# Patient Record
Sex: Male | Born: 1955
Health system: Southern US, Community
[De-identification: ages and names within clinical notes are randomized; demographics above are authoritative.]

## PROBLEM LIST (undated history)

## (undated) DIAGNOSIS — I779 Disorder of arteries and arterioles, unspecified: Secondary | ICD-10-CM

## (undated) DIAGNOSIS — N319 Neuromuscular dysfunction of bladder, unspecified: Secondary | ICD-10-CM

## (undated) DIAGNOSIS — K219 Gastro-esophageal reflux disease without esophagitis: Secondary | ICD-10-CM

## (undated) DIAGNOSIS — N186 End stage renal disease: Secondary | ICD-10-CM

## (undated) DIAGNOSIS — F909 Attention-deficit hyperactivity disorder, unspecified type: Secondary | ICD-10-CM

## (undated) DIAGNOSIS — E7800 Pure hypercholesterolemia, unspecified: Secondary | ICD-10-CM

## (undated) DIAGNOSIS — B1920 Unspecified viral hepatitis C without hepatic coma: Secondary | ICD-10-CM

## (undated) DIAGNOSIS — I7409 Other arterial embolism and thrombosis of abdominal aorta: Secondary | ICD-10-CM

## (undated) DIAGNOSIS — I739 Peripheral vascular disease, unspecified: Secondary | ICD-10-CM

## (undated) DIAGNOSIS — Z87448 Personal history of other diseases of urinary system: Secondary | ICD-10-CM

## (undated) DIAGNOSIS — J189 Pneumonia, unspecified organism: Secondary | ICD-10-CM

## (undated) DIAGNOSIS — Z992 Dependence on renal dialysis: Secondary | ICD-10-CM

## (undated) DIAGNOSIS — I10 Essential (primary) hypertension: Secondary | ICD-10-CM

## (undated) DIAGNOSIS — D649 Anemia, unspecified: Secondary | ICD-10-CM

## (undated) DIAGNOSIS — Z789 Other specified health status: Secondary | ICD-10-CM

## (undated) DIAGNOSIS — K648 Other hemorrhoids: Secondary | ICD-10-CM

## (undated) HISTORY — DX: Other hemorrhoids: K64.8

## (undated) HISTORY — DX: Pure hypercholesterolemia, unspecified: E78.00

## (undated) HISTORY — DX: Disorder of arteries and arterioles, unspecified: I77.9

## (undated) HISTORY — PX: COLONOSCOPY: SHX174

## (undated) HISTORY — DX: Unspecified viral hepatitis C without hepatic coma: B19.20

## (undated) HISTORY — DX: Neuromuscular dysfunction of bladder, unspecified: N31.9

## (undated) HISTORY — DX: Other arterial embolism and thrombosis of abdominal aorta: I74.09

## (undated) HISTORY — DX: Peripheral vascular disease, unspecified: I73.9

## (undated) HISTORY — PX: AV FISTULA REPAIR: SHX563

## (undated) HISTORY — DX: Essential (primary) hypertension: I10

## (undated) HISTORY — DX: Personal history of other diseases of urinary system: Z87.448

## (undated) HISTORY — DX: End stage renal disease: N18.6

## (undated) HISTORY — PX: NEPHRECTOMY TRANSPLANTED ORGAN: SUR880

## (undated) HISTORY — DX: Dependence on renal dialysis: Z99.2

## (undated) HISTORY — PX: KIDNEY TRANSPLANT: SHX239

---

## 1996-05-03 HISTORY — PX: AV FISTULA PLACEMENT: SHX1204

## 2007-03-17 ENCOUNTER — Ambulatory Visit (HOSPITAL_BASED_OUTPATIENT_CLINIC_OR_DEPARTMENT_OTHER): Admission: RE | Admit: 2007-03-17 | Discharge: 2007-03-17 | Payer: Self-pay | Admitting: Surgery

## 2008-04-22 ENCOUNTER — Ambulatory Visit: Payer: Self-pay | Admitting: Internal Medicine

## 2008-05-03 HISTORY — PX: NEPHRECTOMY RECIPIENT: SUR879

## 2008-05-06 ENCOUNTER — Ambulatory Visit: Payer: Self-pay | Admitting: Internal Medicine

## 2008-09-27 ENCOUNTER — Ambulatory Visit: Payer: Self-pay | Admitting: Vascular Surgery

## 2009-04-02 HISTORY — PX: OTHER SURGICAL HISTORY: SHX169

## 2009-09-12 ENCOUNTER — Ambulatory Visit: Payer: Self-pay | Admitting: Vascular Surgery

## 2009-09-30 ENCOUNTER — Ambulatory Visit: Payer: Self-pay | Admitting: Vascular Surgery

## 2009-09-30 ENCOUNTER — Ambulatory Visit (HOSPITAL_COMMUNITY): Admission: RE | Admit: 2009-09-30 | Discharge: 2009-09-30 | Payer: Self-pay | Admitting: Vascular Surgery

## 2009-10-24 ENCOUNTER — Ambulatory Visit: Payer: Self-pay | Admitting: Vascular Surgery

## 2009-12-05 ENCOUNTER — Ambulatory Visit: Payer: Self-pay | Admitting: Vascular Surgery

## 2009-12-08 ENCOUNTER — Ambulatory Visit (HOSPITAL_COMMUNITY): Admission: RE | Admit: 2009-12-08 | Discharge: 2009-12-08 | Payer: Self-pay | Admitting: Vascular Surgery

## 2009-12-08 ENCOUNTER — Ambulatory Visit: Payer: Self-pay | Admitting: Vascular Surgery

## 2009-12-30 ENCOUNTER — Ambulatory Visit: Payer: Self-pay | Admitting: Vascular Surgery

## 2010-05-24 ENCOUNTER — Encounter: Payer: Self-pay | Admitting: Nephrology

## 2010-07-17 LAB — POCT I-STAT 4, (NA,K, GLUC, HGB,HCT)
Glucose, Bld: 106 mg/dL — ABNORMAL HIGH (ref 70–99)
HCT: 51 % (ref 39.0–52.0)
Hemoglobin: 17.3 g/dL — ABNORMAL HIGH (ref 13.0–17.0)
Potassium: 4.2 mEq/L (ref 3.5–5.1)

## 2010-07-20 LAB — POCT I-STAT 4, (NA,K, GLUC, HGB,HCT)
HCT: 48 % (ref 39.0–52.0)
Potassium: 4 mEq/L (ref 3.5–5.1)
Sodium: 144 mEq/L (ref 135–145)

## 2010-09-14 ENCOUNTER — Emergency Department (HOSPITAL_COMMUNITY)
Admission: EM | Admit: 2010-09-14 | Discharge: 2010-09-14 | Disposition: A | Discharge status: Home / Self Care | Payer: Medicare Other | Attending: Emergency Medicine | Admitting: Emergency Medicine

## 2010-09-14 DIAGNOSIS — H6120 Impacted cerumen, unspecified ear: Secondary | ICD-10-CM | POA: Insufficient documentation

## 2010-09-14 DIAGNOSIS — I10 Essential (primary) hypertension: Secondary | ICD-10-CM | POA: Insufficient documentation

## 2010-09-14 DIAGNOSIS — H9209 Otalgia, unspecified ear: Secondary | ICD-10-CM | POA: Insufficient documentation

## 2010-09-15 NOTE — Assessment & Plan Note (Signed)
OFFICE VISIT   Scott Chavez, Scott Chavez  DOB:  08-Jan-1956                                       10/24/2009  [id number]   Patient presents today for follow-up of ligation of his left Cimino AV  fistula and excision of his venous aneurysmal dilatation throughout his  vein from his wrist to his antecubital space.  He has had a moderate  amount of discomfort related to the tracking of blood in the  subcutaneous tunnel.  This is more on the ulnar aspect of his arm away  from the fistula.   I explained to him that this is dependent.  He does have some blood  still in the tunnel as well.  He does have a 2+ radial pulse and a well-  perfused hand.   He will continue his usual activities.  I plan to see him again in 1  month for continued follow-up.  He was given a prescription for Tylox  #30, no refill, for persistent discomfort.     Rosetta Posner, M.D.  Electronically Signed   TFE/MEDQ  D:  10/24/2009  T:  10/24/2009  Job:  [id number]

## 2010-09-15 NOTE — Assessment & Plan Note (Signed)
OFFICE VISIT   TUF, LATONA  DOB:  10-09-1955                                       12/30/2009  [id number]   The patient presents today for followup of resection of venous aneurysm  at the level of his radiocephalic anastomosis on [id number].  He had had a  prior resection of a large venous aneurysm throughout his upper arm  fistula on 09/30/2009.  He had had a retained area of thrombosed  cephalic vein in his wrist that was still uncomfortable for him and we  opted for resection of this.  He has done quite well with minimal  discomfort around the area and excellent result.  He is pleased as am I  and will see [place] again on an as-needed basis.     Rosetta Posner, M.D.  Electronically Signed   TFE/MEDQ  D:  12/30/2009  T:  12/31/2009  Job:  [id number]   cc:   Louis Meckel, M.D.

## 2010-09-15 NOTE — Assessment & Plan Note (Signed)
OFFICE VISIT   RIDGE, DUMOND  DOB:  1955-06-16                                       09/12/2009  [id number]   The patient presents today for evaluation of his left arm AV fistula.  I  had seen him in initial consultation 1 year ago.  At that time he was  having enlargement of venous aneurysms on his left Cimino AV fistula.  At that time he reported this had been present for approximately 12  years.  He fortunately had a cadaveric transplant in December 2010 at  [facility].  He has had excellent initial result with his transplant  and has normal return of creatinine and renal function.  He has not been  using his fistula for 6 months.   PHYSICAL EXAM:  Well-developed well-nourished gentleman appearing stated  age.  Blood pressure is 111/73, pulse 76, respirations 14.  HEENT is  normal.  Musculoskeletal:  No major deformity or cyanosis.  Neurologic:  No focal weakness, paresthesias.  Skin:  Without ulcers or rashes.  His  left arm is noted for a very large AV fistula with a nicely developed  cephalic vein from the wrist up to the antecubital space.  He does have  two prominent venous aneurysms with loss of pigment over these areas.  He is not having any particular pain associated with this but is  concerned about the enlargement and these are somewhat cumbersome due to  their projection from the skin.  I had a long discussion with the  patient explaining that the only options would be to continue to watch  this and have this available should he develop a transplant rejection  and require hemodialysis in the future.  The other option would be to  ligate the AV fistula at the wrist and resect these venous aneurysms.  I  explained that this would obviously burn a bridge for any use of this  in the future.  I explained that he has had a much better outcome with  access than average and in all likelihood any future access would not  function as well as  what he has had with this.  He understands his  choices and will continue to consider this and notify us should he wish  to have a ligation of his AV fistula and resection of the venous  aneurysms.     Rosetta Posner, M.D.  Electronically Signed   TFE/MEDQ  D:  09/12/2009  T:  09/15/2009  Job:  [id number]   cc:   Louis Meckel, M.D.

## 2010-09-15 NOTE — Consult Note (Signed)
NEW PATIENT CONSULTATION   Scott Chavez, Scott Chavez  DOB:  [birth year]                                       09/27/2008  [id number]   The patient presents today for evaluation of his left arm AV fistula.  He is a 55 year old black male with a long history of renal failure and  hemodialysis.  He has recently moved to this area. He has a left Cimino  AV fistula which he reports has been present for 12 years and I have no  way of documenting this.  He does have a history of hypertension.   SOCIAL HISTORY:  He is married.  He does not smoke having quit 5 years  ago.   ALLERGIES:  No known drug allergies.   MEDICATIONS:  1. Procardia.  2. Clonidine.  3. Renagel.  4. Zantac.  5. Ambien.   PHYSICAL EXAMINATION:  General:  A well-developed, thin black male  appearing his stated age of 12.  His right radial pulse is 2+, on the  left he has a very well-developed left forearm fistula with typical  aneurysmal changes of longstanding duration.  He does have several areas  that have had multiple punctures over the last 12 years, but does not  have any skin breakdown.   I discussed the importance of continued site rotation with the patient  who understands this fully.  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  I explained despite the fact that he has  aneurysmal change over his fistula that this is simply the result of  outstanding longterm result of his fistula.  He will continue his  dialysis and see [place] on as-needed basis.   Rosetta Posner, M.D.  Electronically Signed   TFE/MEDQ  D:  09/27/2008  T:  10/01/2008  Job:  [id number]   cc:   Louis Meckel, M.D.

## 2010-09-15 NOTE — Assessment & Plan Note (Signed)
OFFICE VISIT   Scott Chavez, Scott Chavez  DOB:  [birth year]                                       12/05/2009  [id number]   HISTORY OF PRESENT ILLNESS:  The patient presents today for continued  followup of his left upper arm AV fistula excision for a huge aneurysm  degeneration.  I had done an ellipse of skin over 2 large areas of  aneurysm.  This has all healed quite nicely.  He does have an area of  trapped blood inside the vein over an area of approximately 3 cm which  is bothersome to him.  This does cause pain at the incision and he is  bumping this on things when he is at his work.  He is now 2-1/2 months  out and I feel if this was going to resolve spontaneously it would have.  I feel that it is appropriate to do an outpatient procedure for  resection of this segment of vein at near the radial anastomosis.  He  understands the procedure and we will proceed with this as an outpatient  on Monday 08/08 at [place], M.D.  Electronically Signed   TFE/MEDQ  D:  12/05/2009  T:  12/05/2009  Job:  [id number]

## 2010-09-15 NOTE — Op Note (Signed)
NAMEVYRON, Scott Chavez               ACCOUNT NO.:  [id number]   MEDICAL RECORD NO.:  [id number]          PATIENT TYPE:  AMB   LOCATION:  [place]                          FACILITY:  [facility]   PHYSICIAN:  Imogene Burn. Georgette Dover, M.D. DATE OF BIRTH:  [birth year]   DATE OF PROCEDURE:  03/17/2007  DATE OF DISCHARGE:                               OPERATIVE REPORT   PREOPERATIVE DIAGNOSIS:  Sebaceous cyst left neck.   POSTOPERATIVE DIAGNOSIS.:  Sebaceous cyst left neck.   PROCEDURE:  Excision of sebaceous cyst left neck (2 cm diameter).   SURGEON:  Imogene Burn. Georgette Dover, M.D., FACS   ANESTHESIA:  Local MAC.   INDICATIONS:  The patient is a 55 year old male with hemodialysis-  dependent renal failure who presents with an enlarging cyst of the left  side of his neck.  He is requesting excision due to the fact that it is  protruding and is easily visible.   DESCRIPTION OF PROCEDURE:  The patient was brought to the operating room  and placed in the lateral position with his left side up.  He was given  intravenous sedation.  The left side of his neck was prepped with  Betadine and draped in a sterile fashion.  A time-out was taken to  ensure the proper patient and proper procedure.  The area around the  mass was infiltrated with 0.25% Marcaine with epinephrine.  A transverse  scission was made over the mass.  We dissected down to the surface of  the cyst sharply.  We bluntly dissected around the entire cyst.  The  cyst was removed.  Hemostasis was obtained with cautery.  The wound was  closed with a layer of 4-0 Monocryl.  Dermabond was used to seal the  skin.  The patient was awakened and brought to the recovery room in  stable condition.      Imogene Burn. Tsuei, M.D.  Electronically Signed     MKT/MEDQ  D:  03/17/2007  T:  03/18/2007  Job:  [id number]   cc:   Louis Meckel, M.D.

## 2010-12-14 ENCOUNTER — Emergency Department (HOSPITAL_COMMUNITY): Payer: PRIVATE HEALTH INSURANCE

## 2010-12-14 ENCOUNTER — Emergency Department (HOSPITAL_COMMUNITY)
Admission: EM | Admit: 2010-12-14 | Discharge: 2010-12-15 | Disposition: A | Discharge status: Home / Self Care | Payer: PRIVATE HEALTH INSURANCE | Attending: Emergency Medicine | Admitting: Emergency Medicine

## 2010-12-14 DIAGNOSIS — S93409A Sprain of unspecified ligament of unspecified ankle, initial encounter: Secondary | ICD-10-CM | POA: Insufficient documentation

## 2010-12-14 DIAGNOSIS — I10 Essential (primary) hypertension: Secondary | ICD-10-CM | POA: Insufficient documentation

## 2010-12-14 DIAGNOSIS — X500XXA Overexertion from strenuous movement or load, initial encounter: Secondary | ICD-10-CM | POA: Insufficient documentation

## 2010-12-14 DIAGNOSIS — Y9302 Activity, running: Secondary | ICD-10-CM | POA: Insufficient documentation

## 2011-01-07 ENCOUNTER — Emergency Department (HOSPITAL_COMMUNITY)
Admission: EM | Admit: 2011-01-07 | Discharge: 2011-01-07 | Disposition: A | Discharge status: Home / Self Care | Payer: PRIVATE HEALTH INSURANCE | Attending: Emergency Medicine | Admitting: Emergency Medicine

## 2011-01-07 ENCOUNTER — Emergency Department (HOSPITAL_COMMUNITY): Payer: PRIVATE HEALTH INSURANCE

## 2011-01-07 DIAGNOSIS — R05 Cough: Secondary | ICD-10-CM | POA: Insufficient documentation

## 2011-01-07 DIAGNOSIS — Z940 Kidney transplant status: Secondary | ICD-10-CM | POA: Insufficient documentation

## 2011-01-07 DIAGNOSIS — R0989 Other specified symptoms and signs involving the circulatory and respiratory systems: Secondary | ICD-10-CM | POA: Insufficient documentation

## 2011-01-07 DIAGNOSIS — R059 Cough, unspecified: Secondary | ICD-10-CM | POA: Insufficient documentation

## 2011-01-07 DIAGNOSIS — Z79899 Other long term (current) drug therapy: Secondary | ICD-10-CM | POA: Insufficient documentation

## 2011-01-07 DIAGNOSIS — J40 Bronchitis, not specified as acute or chronic: Secondary | ICD-10-CM | POA: Insufficient documentation

## 2011-01-07 DIAGNOSIS — I10 Essential (primary) hypertension: Secondary | ICD-10-CM | POA: Insufficient documentation

## 2011-02-09 LAB — BASIC METABOLIC PANEL
Calcium: 8.8
Creatinine, Ser: 11.06 — ABNORMAL HIGH
GFR calc non Af Amer: 5 — ABNORMAL LOW
Potassium: 5.1
Sodium: 137

## 2011-02-09 LAB — CBC
Hemoglobin: 15.5
RBC: 5.59
RDW: 18.1 — ABNORMAL HIGH

## 2011-02-09 LAB — DIFFERENTIAL
Basophils Relative: 0
Lymphocytes Relative: 21
Lymphs Abs: 1
Monocytes Relative: 9
Neutro Abs: 3.2

## 2011-04-19 DIAGNOSIS — N319 Neuromuscular dysfunction of bladder, unspecified: Secondary | ICD-10-CM | POA: Diagnosis present

## 2011-04-19 DIAGNOSIS — Z940 Kidney transplant status: Secondary | ICD-10-CM

## 2013-01-16 ENCOUNTER — Encounter: Payer: Self-pay | Admitting: *Deleted

## 2013-01-22 ENCOUNTER — Other Ambulatory Visit: Payer: Self-pay | Admitting: *Deleted

## 2013-01-22 ENCOUNTER — Institutional Professional Consult (permissible substitution): Payer: Medicare Other | Admitting: Cardiology

## 2013-01-22 ENCOUNTER — Encounter: Payer: Self-pay | Admitting: Vascular Surgery

## 2013-01-22 DIAGNOSIS — I70219 Atherosclerosis of native arteries of extremities with intermittent claudication, unspecified extremity: Secondary | ICD-10-CM

## 2013-02-09 ENCOUNTER — Encounter: Payer: Medicare Other | Admitting: Vascular Surgery

## 2013-02-15 ENCOUNTER — Encounter: Payer: Self-pay | Admitting: Vascular Surgery

## 2013-02-16 ENCOUNTER — Ambulatory Visit (INDEPENDENT_AMBULATORY_CARE_PROVIDER_SITE_OTHER): Payer: PRIVATE HEALTH INSURANCE | Admitting: Vascular Surgery

## 2013-02-16 ENCOUNTER — Ambulatory Visit (HOSPITAL_COMMUNITY)
Admission: RE | Admit: 2013-02-16 | Discharge: 2013-02-16 | Disposition: A | Discharge status: Home / Self Care | Payer: PRIVATE HEALTH INSURANCE | Source: Ambulatory Visit | Attending: Vascular Surgery | Admitting: Vascular Surgery

## 2013-02-16 ENCOUNTER — Encounter (INDEPENDENT_AMBULATORY_CARE_PROVIDER_SITE_OTHER): Payer: Self-pay

## 2013-02-16 ENCOUNTER — Other Ambulatory Visit: Payer: Self-pay | Admitting: Vascular Surgery

## 2013-02-16 ENCOUNTER — Ambulatory Visit (INDEPENDENT_AMBULATORY_CARE_PROVIDER_SITE_OTHER)
Admission: RE | Admit: 2013-02-16 | Discharge: 2013-02-16 | Disposition: A | Discharge status: Home / Self Care | Payer: PRIVATE HEALTH INSURANCE | Source: Ambulatory Visit | Attending: Vascular Surgery | Admitting: Vascular Surgery

## 2013-02-16 ENCOUNTER — Encounter: Payer: Self-pay | Admitting: Vascular Surgery

## 2013-02-16 VITALS — BP 177/95 | HR 54 | Ht 70.0 in | Wt 156.0 lb

## 2013-02-16 DIAGNOSIS — I70219 Atherosclerosis of native arteries of extremities with intermittent claudication, unspecified extremity: Secondary | ICD-10-CM

## 2013-02-16 DIAGNOSIS — R9430 Abnormal result of cardiovascular function study, unspecified: Secondary | ICD-10-CM

## 2013-02-16 DIAGNOSIS — Z9489 Other transplanted organ and tissue status: Secondary | ICD-10-CM

## 2013-02-16 DIAGNOSIS — Z940 Kidney transplant status: Secondary | ICD-10-CM | POA: Insufficient documentation
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
## 2013-02-23 ENCOUNTER — Other Ambulatory Visit: Payer: Self-pay

## 2013-03-02 ENCOUNTER — Encounter (HOSPITAL_COMMUNITY): Payer: Self-pay | Admitting: Pharmacist

## 2013-03-07 ENCOUNTER — Encounter (HOSPITAL_COMMUNITY): Payer: Self-pay | Admitting: Pharmacy Technician

## 2013-03-09 ENCOUNTER — Encounter (HOSPITAL_COMMUNITY)
Admission: RE | Disposition: A | Discharge status: Home / Self Care | Payer: Self-pay | Source: Ambulatory Visit | Attending: Vascular Surgery

## 2013-03-09 ENCOUNTER — Ambulatory Visit (HOSPITAL_COMMUNITY)
Admission: RE | Admit: 2013-03-09 | Discharge: 2013-03-09 | Disposition: A | Discharge status: Home / Self Care | Payer: PRIVATE HEALTH INSURANCE | Source: Ambulatory Visit | Attending: Vascular Surgery | Admitting: Vascular Surgery

## 2013-03-09 ENCOUNTER — Telehealth: Payer: Self-pay | Admitting: Vascular Surgery

## 2013-03-09 DIAGNOSIS — N186 End stage renal disease: Secondary | ICD-10-CM | POA: Insufficient documentation

## 2013-03-09 DIAGNOSIS — Z79899 Other long term (current) drug therapy: Secondary | ICD-10-CM | POA: Insufficient documentation

## 2013-03-09 DIAGNOSIS — I70219 Atherosclerosis of native arteries of extremities with intermittent claudication, unspecified extremity: Secondary | ICD-10-CM | POA: Insufficient documentation

## 2013-03-09 DIAGNOSIS — Z992 Dependence on renal dialysis: Secondary | ICD-10-CM | POA: Insufficient documentation

## 2013-03-09 DIAGNOSIS — I120 Hypertensive chronic kidney disease with stage 5 chronic kidney disease or end stage renal disease: Secondary | ICD-10-CM | POA: Insufficient documentation

## 2013-03-09 DIAGNOSIS — Z940 Kidney transplant status: Secondary | ICD-10-CM | POA: Insufficient documentation

## 2013-03-09 HISTORY — PX: LOWER EXTREMITY ANGIOGRAM: SHX5508

## 2013-03-09 HISTORY — PX: ABDOMINAL AORTAGRAM: SHX5454

## 2013-03-09 LAB — POCT I-STAT, CHEM 8
BUN: 11 mg/dL (ref 6–23)
Calcium, Ion: 1.31 mmol/L — ABNORMAL HIGH (ref 1.12–1.23)
Creatinine, Ser: 1.2 mg/dL (ref 0.50–1.35)
Glucose, Bld: 116 mg/dL — ABNORMAL HIGH (ref 70–99)
Hemoglobin: 14.6 g/dL (ref 13.0–17.0)
Potassium: 3.6 mEq/L (ref 3.5–5.1)
Sodium: 142 mEq/L (ref 135–145)
TCO2: 22 mmol/L (ref 0–100)

## 2013-03-09 SURGERY — ABDOMINAL AORTAGRAM
Anesthesia: LOCAL

## 2013-03-09 MED ORDER — MORPHINE SULFATE 10 MG/ML IJ SOLN: 2.0000 mg | INTRAMUSCULAR | Status: DC | PRN

## 2013-03-09 MED ORDER — NITROGLYCERIN 0.2 MG/ML ON CALL CATH LAB
INTRAVENOUS | Status: AC
  Filled 2013-03-09: qty 1

## 2013-03-09 MED ORDER — HYDRALAZINE HCL 20 MG/ML IJ SOLN: 10.0000 mg | INTRAMUSCULAR | Status: DC | PRN

## 2013-03-09 MED ORDER — FENTANYL CITRATE 0.05 MG/ML IJ SOLN
INTRAMUSCULAR | Status: AC
  Filled 2013-03-09: qty 2

## 2013-03-09 MED ORDER — LIDOCAINE HCL (PF) 1 % IJ SOLN
INTRAMUSCULAR | Status: AC
  Filled 2013-03-09: qty 30

## 2013-03-09 MED ORDER — LABETALOL HCL 5 MG/ML IV SOLN: 10.0000 mg | INTRAVENOUS | Status: DC | PRN

## 2013-03-09 MED ORDER — HEPARIN (PORCINE) IN NACL 2-0.9 UNIT/ML-% IJ SOLN
INTRAMUSCULAR | Status: AC
  Filled 2013-03-09: qty 500

## 2013-03-09 MED ORDER — OXYCODONE-ACETAMINOPHEN 5-325 MG PO TABS: 1.0000 | ORAL_TABLET | ORAL | Status: DC | PRN

## 2013-03-09 MED ORDER — SODIUM CHLORIDE 0.9 % IV SOLN
INTRAVENOUS | Status: DC
  Administered 2013-03-09: 06:00:00 via INTRAVENOUS

## 2013-03-09 MED ORDER — ACETAMINOPHEN 325 MG PO TABS: 325.0000 mg | ORAL_TABLET | ORAL | Status: DC | PRN

## 2013-03-09 MED ORDER — ONDANSETRON HCL 4 MG/2ML IJ SOLN: 4.0000 mg | Freq: Four times a day (QID) | INTRAMUSCULAR | Status: DC | PRN

## 2013-03-09 MED ORDER — SODIUM CHLORIDE 0.45 % IV SOLN
INTRAVENOUS | Status: DC
  Administered 2013-03-09: 09:00:00 via INTRAVENOUS

## 2013-03-09 MED ORDER — MIDAZOLAM HCL 2 MG/2ML IJ SOLN
INTRAMUSCULAR | Status: AC
  Filled 2013-03-09: qty 2

## 2013-03-09 MED ORDER — ACETAMINOPHEN 325 MG RE SUPP: 325.0000 mg | RECTAL | Status: DC | PRN

## 2013-03-09 MED ORDER — METOPROLOL TARTRATE 1 MG/ML IV SOLN: 2.0000 mg | INTRAVENOUS | Status: DC | PRN

## 2013-03-09 NOTE — Interval H&P Note (Signed)
History and Physical Interval Note:  03/09/2013 7:44 AM  Scott Chavez  has presented today for surgery, with the diagnosis of PVD  The various methods of treatment have been discussed with the patient and family. After consideration of risks, benefits and other options for treatment, the patient has consented to  Procedure(s): ABDOMINAL AORTAGRAM (N/A) as a surgical intervention .  The patient's history has been reviewed, patient examined, no change in status, stable for surgery.  I have reviewed the patient's chart and labs.  Questions were answered to the patient's satisfaction.     FIELDS,CHARLES E
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
## 2013-03-09 NOTE — Op Note (Signed)
[facility]           OF [place]   Procedure: Aortogram with bilateral lower extremity runoff  Preoperative diagnosis: Claudication  Postoperative diagnosis: Same  Anesthesia: Local with sedation  Operative details: After obtaining informed consent, the patient was taken to the [facility]. The patient was placed in supine position on the Angio table. Both groins were prepped and draped in usual sterile fashion. Local anesthesia was infiltrated over the left common femoral artery. Initially, ultrasound was used to identify the common femoral artery.  There was a large amount of calcific plaque in the artery. This was in islands and I was able to find a reasonable spot for puncture of the artery.   An introducer needle was used to cannulate the left common femoral artery under ultrasound guidance and an 035 versacore wire threaded into the abdominal aorta under fluoroscopic guidance. Next a 5 French sheath is placed over the guidewire in the left common femoral artery. This was thoroughly flushed with heparinized saline. A 5 French pigtail catheter was placed over the guidewire into the abdominal aorta and abdominal aortogram was obtained. The infrarenal abdominal aorta has some calcification but is patent. The right and left common iliac arteries are patent. There is an exophytic calcified plaque which obstructs approximately 60% of the left common iliac origin. There is a flap-like plaque in the right mid common iliac artery which does not appear to be obstructing flow. The internal iliac arteries are occluded bilaterally. The external iliac arteries are patent. There is a right pelvic kidney consistent with transplant with the anastomosis to the right external iliac artery. The anastomosis is widely patent. Bilateral oblique views of the pelvis were obtained to confirm the above findings after pulling the pigtail catheter down just above the aortic bifurcation. Next bilateral  lower extremity runoff views were obtained through the pigtail catheter.  In the left lower extremity, the left common femoral artery is calcified but patent. The left profunda femoris is patent. The left superficial femoral artery is diffusely diseased with multiple areas of subtotal occlusion. There is a high-grade calcified exophytic stenosis in the left popliteal artery approaching 90% right behind the knee joint. The below-knee popliteal artery is patent. There is one-vessel runoff via the peroneal to the left foot. The anterior tibial artery and posterior tibial arteries are occluded.  In the right lower extremity, the right common femoral artery is calcified but patent. The right profunda femoris is patent. The right superficial femoral artery is diffusely diseased with multiple areas of subtotal occlusion. The above-knee popliteal artery is also diffusely diseased with multiple areas of stenosis. The below-knee popliteal artery is patent. There is 2 vessel runoff via the peroneal and posterior tibial artery. The anterior tibial artery is occluded.  At this point I measured a pressure gradient across the left iliac stenosis. The pigtail catheter was exchanged over a guidewire for a 5 French straight catheter. The patient was given 200 mcg of nitroglycerin down the left femoral sheath. This was flushed with heparinized saline. We then proceeded to measure pressure from the infrarenal aorta all the way down through the left iliac system. There was a 10 mm gradient from the aorta to the mid iliac. Since this drop did not seem significant with nitroglycerin administration, I decided not to intervene on the left common iliac lesion. At this point the 5 French sheath was thoroughly flushed with heparinized saline.   The 5 Fr sheath  was left in place to be pulled in the holding area. The patient tolerated the procedure well and there were no complications. Patient was taken to the holding area in stable  condition.  Operative findings: #1 patent arterial anastomosis to right pelvic transplanted kidney #2 bilateral diffuse superficial femoral artery occlusive disease with several areas of subtotal occlusion #3 one vessel runoff left foot via the peroneal #42 vessel runoff to the right foot via the peroneal and posterior tibial arteries #5 calcified exophytic plaque left common iliac artery with only 10 mm gradient with nitroglycerin provocation  Operative management: I discussed the above images with Dr. Bridgett Larsson. He will review these images and discuss further plans and management with the patient regarding possible bypass percutaneous intervention or conservative management.  Ruta Hinds, MD [facility] [place] Office: [phone] Pager: [phone]

## 2013-03-09 NOTE — Progress Notes (Signed)
Pt received form cath proceddure alert and able to tolerate food and fluid.  Denies any discomfort at this time.  Family at bedside

## 2013-03-09 NOTE — Telephone Encounter (Addendum)
Message copied by Gena Fray on Fri Mar 09, 2013 10:19 AM ------      Message from: Elam Dutch      Created: Fri Mar 09, 2013  8:41 AM       Aortogram with bilat runoff, pressure measurement with nitro administration left leg.  Ultrasound.            Pt needs appt with Dr Bridgett Larsson next 1-2 weeks to discuss further management.            Charles ------  03/09/13: left message for patient on home # listed, dpm

## 2013-03-09 NOTE — Progress Notes (Signed)
Discharge instruction given per MD order.  Pt and CG able to verbalize understanding.  Pt to car via wheelchair. 

## 2013-03-15 ENCOUNTER — Encounter: Payer: Self-pay | Admitting: Vascular Surgery

## 2013-03-16 ENCOUNTER — Encounter: Payer: Self-pay | Admitting: Vascular Surgery

## 2013-03-16 ENCOUNTER — Ambulatory Visit (INDEPENDENT_AMBULATORY_CARE_PROVIDER_SITE_OTHER): Payer: PRIVATE HEALTH INSURANCE | Admitting: Vascular Surgery

## 2013-03-16 VITALS — BP 104/78 | HR 66 | Ht 70.0 in | Wt 156.1 lb

## 2013-03-16 DIAGNOSIS — I70219 Atherosclerosis of native arteries of extremities with intermittent claudication, unspecified extremity: Secondary | ICD-10-CM

## 2013-03-16 DIAGNOSIS — Z01810 Encounter for preprocedural cardiovascular examination: Secondary | ICD-10-CM

## 2013-03-16 NOTE — Addendum Note (Signed)
Addended by: Mena Goes on: 03/16/2013 03:48 PM   Modules accepted: Orders

## 2013-03-16 NOTE — Progress Notes (Signed)
[facility] [place]  Postoperative Visit  History of Present Illness  Scott Chavez is a 57 y.o. male who presents for postoperative follow-up from procedure on Date: 03/09/13: aortogram with bilateral runoff.  The patient has had no complications or sx from his L femoral access.  The patient notes continued short distance claudication, L>R.  The patient notes difficulty with ambulation, which interfering with his activities of daily living.    Past Medical History, Past Surgical History, Social History, Family History, Medications, Allergies, and Review of Systems are unchanged from previous evaluation on 03/09/13.  On ROS: no ulcer or gangrene  For VQI Use Only  PRE-ADM LIVING: Home  AMB STATUS: Ambulatory  Physical Examination  Filed Vitals:   03/16/13 0924  BP: 104/78  Pulse: 66  Height: 5\' 10"  (1.778 m)  Weight: 156 lb 1.6 oz (70.806 kg)  SpO2: 97%   Body mass index is 22.4 kg/(m^2).  General: A&O x 3, WDWN  Pulmonary: Sym exp, good air movt, CTAB, no rales, rhonchi, & wheezing  Cardiac: RRR, Nl S1, S2, no Murmurs, rubs or gallops  Vascular: Vessel Right Left  Radial Palpable Palpable  Brachial Palpable Palpable  Carotid Palpable, without bruit Palpable, without bruit  Aorta Not palpable N/A  Femoral Palpable Palpable  Popliteal Not palpable Not palpable  PT Not Palpable Not Palpable  DP Not Palpable Not Palpable   Gastrointestinal: soft, NTND, -G/R, - HSM, - masses, - CVAT B  Musculoskeletal: M/S 5/5 throughout , Extremities without ischemic changes , Left groin without hematoma, no echymosis present at cannulation site  Neurologic:  Pain and light touch intact in extremities , Motor exam as listed above  Medical Decision Making  Scott Chavez is a 57 y.o. male who presents s/p diagnostic aortogram with bilateral runoff, atherosclerosis with life-style limiting intermittent claudication. Based on his angiographic findings, this  patient needs: possible L fem-BK pop bypass and possible R SFA orbital atherectomy and PTA. The priority will be the left leg as it is significantly more diseased and sx.  Preop for that intervention include: Preop Cardiac risk stratification and optimization, BLE GSV mapping I discussed in depth with the patient the nature of atherosclerosis, and emphasized the importance of maximal medical management including strict control of blood pressure, blood glucose, and lipid levels, obtaining regular exercise, and cessation of smoking.  The patient is aware that without maximal medical management the underlying atherosclerotic disease process will progress, limiting the benefit of any interventions.  Once his preop work-up is completed, he will return for a final decision on intervention.  Thank you for allowing [place] to participate in this patient's care.  [name], MD [facility] [place] Office: [phone] Pager: [phone]

## 2013-03-23 ENCOUNTER — Encounter (HOSPITAL_COMMUNITY): Payer: PRIVATE HEALTH INSURANCE

## 2013-03-23 ENCOUNTER — Ambulatory Visit: Payer: PRIVATE HEALTH INSURANCE | Admitting: Vascular Surgery

## 2013-03-27 ENCOUNTER — Encounter: Payer: Self-pay | Admitting: Cardiology

## 2013-03-27 ENCOUNTER — Ambulatory Visit (INDEPENDENT_AMBULATORY_CARE_PROVIDER_SITE_OTHER): Payer: PRIVATE HEALTH INSURANCE | Admitting: Cardiology

## 2013-03-27 VITALS — BP 140/86 | HR 61 | Ht 70.0 in | Wt 154.0 lb

## 2013-03-27 DIAGNOSIS — I10 Essential (primary) hypertension: Secondary | ICD-10-CM

## 2013-03-27 DIAGNOSIS — E785 Hyperlipidemia, unspecified: Secondary | ICD-10-CM

## 2013-03-27 DIAGNOSIS — I70219 Atherosclerosis of native arteries of extremities with intermittent claudication, unspecified extremity: Secondary | ICD-10-CM

## 2013-03-27 DIAGNOSIS — Z01818 Encounter for other preprocedural examination: Secondary | ICD-10-CM

## 2013-03-27 DIAGNOSIS — Z01810 Encounter for preprocedural cardiovascular examination: Secondary | ICD-10-CM | POA: Insufficient documentation

## 2013-03-27 NOTE — Assessment & Plan Note (Signed)
Continue statin. 

## 2013-03-27 NOTE — Patient Instructions (Signed)
Your physician recommends that you schedule a follow-up appointment in:  AS NEEDED PENDING TEST RESULTS  Your physician has requested that you have a lexiscan myoview. For further information please visit [email or web address]. Please follow instruction sheet, as given.   

## 2013-03-27 NOTE — Assessment & Plan Note (Signed)
Continue aspirin and statin. Management per vascular surgery.

## 2013-03-27 NOTE — Assessment & Plan Note (Signed)
Blood pressure controlled. Continue present medications. 
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
## 2013-03-27 NOTE — Assessment & Plan Note (Signed)
Plan lexiscan nuclear study for risk stratification preoperatively. If normal he may proceed with the surgery.

## 2013-04-12 ENCOUNTER — Encounter: Payer: Self-pay | Admitting: Vascular Surgery

## 2013-04-13 ENCOUNTER — Other Ambulatory Visit: Payer: Self-pay | Admitting: Vascular Surgery

## 2013-04-13 ENCOUNTER — Ambulatory Visit (INDEPENDENT_AMBULATORY_CARE_PROVIDER_SITE_OTHER): Payer: PRIVATE HEALTH INSURANCE | Admitting: Vascular Surgery

## 2013-04-13 ENCOUNTER — Other Ambulatory Visit: Payer: Self-pay | Admitting: *Deleted

## 2013-04-13 ENCOUNTER — Ambulatory Visit (HOSPITAL_COMMUNITY)
Admission: RE | Admit: 2013-04-13 | Discharge: 2013-04-13 | Disposition: A | Discharge status: Home / Self Care | Payer: PRIVATE HEALTH INSURANCE | Source: Ambulatory Visit | Attending: Vascular Surgery | Admitting: Vascular Surgery

## 2013-04-13 ENCOUNTER — Encounter: Payer: Self-pay | Admitting: *Deleted

## 2013-04-13 ENCOUNTER — Encounter: Payer: Self-pay | Admitting: Vascular Surgery

## 2013-04-13 VITALS — BP 127/83 | HR 61 | Ht 70.0 in | Wt 150.0 lb

## 2013-04-13 DIAGNOSIS — Z01810 Encounter for preprocedural cardiovascular examination: Secondary | ICD-10-CM

## 2013-04-13 DIAGNOSIS — I70219 Atherosclerosis of native arteries of extremities with intermittent claudication, unspecified extremity: Secondary | ICD-10-CM
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
## 2013-04-16 ENCOUNTER — Ambulatory Visit (HOSPITAL_COMMUNITY): Payer: PRIVATE HEALTH INSURANCE | Attending: Cardiovascular Disease | Admitting: Radiology

## 2013-04-16 ENCOUNTER — Encounter: Payer: Self-pay | Admitting: *Deleted

## 2013-04-16 ENCOUNTER — Encounter: Payer: Self-pay | Admitting: Cardiovascular Disease

## 2013-04-16 VITALS — BP 121/79 | Ht 70.0 in | Wt 155.0 lb

## 2013-04-16 DIAGNOSIS — R0789 Other chest pain: Secondary | ICD-10-CM

## 2013-04-16 DIAGNOSIS — Z01810 Encounter for preprocedural cardiovascular examination: Secondary | ICD-10-CM | POA: Insufficient documentation

## 2013-04-16 DIAGNOSIS — R0609 Other forms of dyspnea: Secondary | ICD-10-CM

## 2013-04-16 DIAGNOSIS — Z01818 Encounter for other preprocedural examination: Secondary | ICD-10-CM

## 2013-04-16 MED ORDER — TECHNETIUM TC 99M SESTAMIBI GENERIC - CARDIOLITE
11.0000 | Freq: Once | INTRAVENOUS | Status: AC | PRN
  Administered 2013-04-16: 11 via INTRAVENOUS

## 2013-04-16 MED ORDER — REGADENOSON 0.4 MG/5ML IV SOLN
0.4000 mg | Freq: Once | INTRAVENOUS | Status: AC
  Administered 2013-04-16: 0.4 mg via INTRAVENOUS

## 2013-04-16 MED ORDER — TECHNETIUM TC 99M SESTAMIBI GENERIC - CARDIOLITE
33.0000 | Freq: Once | INTRAVENOUS | Status: AC | PRN
  Administered 2013-04-16: 33 via INTRAVENOUS

## 2013-04-16 NOTE — Progress Notes (Signed)
  [facility] [address] [place], [place] [address] [phone]    Cardiology Nuclear Med Study  Scott Chavez is a 57 y.o. male     MRN : [id number]     DOB: [birth year]  Procedure Date: 04/16/2013  Nuclear Med Background Indication for Stress Test:  Evaluation for Ischemia, Surgical Clearance:Left Pop Bypass with Dr. Oneida Alar on 04/24/13 and Abnormal EKG:NS T wave changes History:  MR: 1 yr in [place], Renal Failure/ AV fistula with Kidney Transplant Cardiac Risk Factors: History of Smoking, Hypertension, Lipids and PVD  Symptoms:  Chest Pressure and DOE   Nuclear Pre-Procedure Caffeine/Decaff Intake:  7:00pm NPO After: 9:30pm   Lungs:  clear O2 Sat: 98% on room air. IV 0.9% NS with Angio Cath:  22g  IV Site: R Hand  IV Started by:  Matilde Haymaker, RN  Chest Size (in):  40 Cup Size: n/a  Height: 5\' 10"  (1.778 m)  Weight:  155 lb (70.308 kg)  BMI:  Body mass index is 22.24 kg/(m^2). Tech Comments:  n/a    Nuclear Med Study 1 or 2 day study: 1 day  Stress Test Type:  Lexiscan  Reading MD: Mertie Moores, MD  Order Authorizing Provider:  Queen Blossom  Resting Radionuclide: Technetium 75m Sestamibi  Resting Radionuclide Dose: 10.8 mCi   Stress Radionuclide:  Technetium 2m Sestamibi  Stress Radionuclide Dose: 33.0 mCi           Stress Protocol Rest HR: 58 Stress HR: 81  Rest BP: 121/79 Stress BP: 129/81  Exercise Time (min): n/a METS: n/a   Predicted Max HR: 163 bpm % Max HR: 49.69 bpm Rate Pressure Product: 10449   Dose of Adenosine (mg):  n/a Dose of Lexiscan: 0.4 mg  Dose of Atropine (mg): n/a Dose of Dobutamine: n/a mcg/kg/min (at max HR)  Stress Test Technologist: Perrin Maltese, EMT-P  Nuclear Technologist:  Charlton Amor, CNMT     Rest Procedure:  Myocardial perfusion imaging was performed at rest 45 minutes following the intravenous administration of Technetium 108m Sestamibi. Rest ECG: NSR - Normal EKG  Stress  Procedure:  The patient received IV Lexiscan 0.4 mg over 15-seconds.  Technetium 64m Sestamibi injected at 30-seconds. This patient felt weird with the Lexiscan injection. Quantitative spect images were obtained after a 45 minute delay. Stress ECG: No significant change from baseline ECG  QPS Raw Data Images:  Normal; no motion artifact; normal heart/lung ratio. Stress Images:  Normal homogeneous uptake in all areas of the myocardium. Rest Images:  Normal homogeneous uptake in all areas of the myocardium. Subtraction (SDS):  No evidence of ischemia. Transient Ischemic Dilatation (Normal <1.22):  0.99 Lung/Heart Ratio (Normal <0.45):  0.28  Quantitative Gated Spect Images QGS EDV:  103 ml QGS ESV:  47 ml  Impression Exercise Capacity:  Lexiscan with no exercise. BP Response:  Normal blood pressure response. Clinical Symptoms:  No significant symptoms noted. ECG Impression:  No significant ST segment change suggestive of ischemia. Comparison with Prior Nuclear Study: No previous nuclear study performed  Overall Impression:  Normal stress nuclear study.  No evidence of ischemia.    LV Ejection Fraction: 55%.  LV Wall Motion:  NL LV Function; NL Wall Motion.   Thayer Headings, Brooke Bonito., MD, [facility] 04/16/2013, 5:07 PM Office - [phone] Pager [phone]    .

## 2013-04-18 ENCOUNTER — Other Ambulatory Visit (HOSPITAL_COMMUNITY): Payer: Self-pay | Admitting: *Deleted

## 2013-04-18 ENCOUNTER — Encounter (HOSPITAL_COMMUNITY): Payer: Self-pay | Admitting: Pharmacy Technician

## 2013-04-18 NOTE — Pre-Procedure Instructions (Signed)
Scott Chavez  04/18/2013   Your procedure is scheduled on:  Tuesday, April 24, 2013 at 7:30 AM.   Report to [facility] Entrance "A" Admitting Office at 5:30 AM.   Call this number if you have problems the morning of surgery: [phone]   Remember:   Do not eat food or drink liquids after midnight Monday, 04/23/13.   Take these medicines the morning of surgery with A SIP OF WATER: finasteride (PROSCAR), mycophenolate (CELLCEPT), NIFEdipine (PROCARDIA-XL/ADALAT-CC/NIFEDICAL-XL), omeprazole (PRILOSEC), tacrolimus (PROGRAF), tamsulosin (FLOMAX)                Do not wear jewelry.  Do not wear lotions, powders, or cologne. You may wear deodorant.  Men may shave face and neck.  Do not bring valuables to the hospital.  [facility] is not responsible                  for any belongings or valuables.               Contacts, dentures or bridgework may not be worn into surgery.  Leave suitcase in the car. After surgery it may be brought to your room.  For patients admitted to the hospital, discharge time is determined by your                treatment team.                Special Instructions: Shower using CHG 2 nights before surgery and the night before surgery.  If you shower the day of surgery use CHG.  Use special wash - you have one bottle of CHG for all showers.  You should use approximately 1/3 of the bottle for each shower.   Please read over the following fact sheets that you were given: Pain Booklet, Coughing and Deep Breathing, Blood Transfusion Information, MRSA Information and Surgical Site Infection Prevention

## 2013-04-19 ENCOUNTER — Ambulatory Visit (HOSPITAL_COMMUNITY)
Admission: RE | Admit: 2013-04-19 | Discharge: 2013-04-19 | Disposition: A | Discharge status: Home / Self Care | Payer: PRIVATE HEALTH INSURANCE | Source: Ambulatory Visit | Attending: Vascular Surgery | Admitting: Vascular Surgery

## 2013-04-19 ENCOUNTER — Encounter (HOSPITAL_COMMUNITY)
Admission: RE | Admit: 2013-04-19 | Discharge: 2013-04-19 | Disposition: A | Discharge status: Home / Self Care | Payer: PRIVATE HEALTH INSURANCE | Source: Ambulatory Visit | Attending: Vascular Surgery | Admitting: Vascular Surgery

## 2013-04-19 ENCOUNTER — Encounter (HOSPITAL_COMMUNITY): Payer: Self-pay

## 2013-04-19 DIAGNOSIS — Z01818 Encounter for other preprocedural examination: Secondary | ICD-10-CM | POA: Insufficient documentation

## 2013-04-19 DIAGNOSIS — J4489 Other specified chronic obstructive pulmonary disease: Secondary | ICD-10-CM | POA: Insufficient documentation

## 2013-04-19 DIAGNOSIS — Z01812 Encounter for preprocedural laboratory examination: Secondary | ICD-10-CM | POA: Insufficient documentation

## 2013-04-19 DIAGNOSIS — J449 Chronic obstructive pulmonary disease, unspecified: Secondary | ICD-10-CM | POA: Insufficient documentation

## 2013-04-19 HISTORY — DX: Anemia, unspecified: D64.9

## 2013-04-19 HISTORY — DX: Gastro-esophageal reflux disease without esophagitis: K21.9

## 2013-04-19 HISTORY — DX: Other specified health status: Z78.9

## 2013-04-19 LAB — CBC
Hemoglobin: 15.4 g/dL (ref 13.0–17.0)
MCH: 30.6 pg (ref 26.0–34.0)
MCV: 89.9 fL (ref 78.0–100.0)
Platelets: 173 10*3/uL (ref 150–400)
RBC: 5.03 MIL/uL (ref 4.22–5.81)
WBC: 3.6 10*3/uL — ABNORMAL LOW (ref 4.0–10.5)

## 2013-04-19 LAB — COMPREHENSIVE METABOLIC PANEL
ALT: 22 U/L (ref 0–53)
AST: 33 U/L (ref 0–37)
CO2: 25 mEq/L (ref 19–32)
Calcium: 9.6 mg/dL (ref 8.4–10.5)
Chloride: 104 mEq/L (ref 96–112)
GFR calc Af Amer: 89 mL/min — ABNORMAL LOW (ref >90)
GFR calc non Af Amer: 77 mL/min — ABNORMAL LOW (ref >90)
Glucose, Bld: 107 mg/dL — ABNORMAL HIGH (ref 70–99)
Sodium: 137 mEq/L (ref 135–145)
Total Bilirubin: 0.5 mg/dL (ref 0.3–1.2)
Total Protein: 7.1 g/dL (ref 6.0–8.3)

## 2013-04-19 LAB — ABO/RH: ABO/RH(D): O POS

## 2013-04-19 LAB — SURGICAL PCR SCREEN
MRSA, PCR: NEGATIVE
Staphylococcus aureus: NEGATIVE

## 2013-04-19 LAB — TYPE AND SCREEN: ABO/RH(D): O POS

## 2013-04-23 MED ORDER — SODIUM CHLORIDE 0.9 % IV SOLN: INTRAVENOUS | Status: DC

## 2013-04-23 MED ORDER — DEXTROSE 5 % IV SOLN
1.5000 g | INTRAVENOUS | Status: AC
  Administered 2013-04-24: 1.5 g via INTRAVENOUS
  Filled 2013-04-23: qty 1.5

## 2013-04-24 ENCOUNTER — Inpatient Hospital Stay (HOSPITAL_COMMUNITY): Payer: PRIVATE HEALTH INSURANCE | Admitting: Certified Registered"

## 2013-04-24 ENCOUNTER — Encounter (HOSPITAL_COMMUNITY): Payer: Self-pay | Admitting: Certified Registered"

## 2013-04-24 ENCOUNTER — Inpatient Hospital Stay (HOSPITAL_COMMUNITY): Payer: PRIVATE HEALTH INSURANCE

## 2013-04-24 ENCOUNTER — Encounter (HOSPITAL_COMMUNITY)
Admission: RE | Disposition: A | Discharge status: Home / Self Care | Payer: Self-pay | Source: Ambulatory Visit | Attending: Vascular Surgery

## 2013-04-24 ENCOUNTER — Inpatient Hospital Stay (HOSPITAL_COMMUNITY)
Admission: RE | Admit: 2013-04-24 | Discharge: 2013-04-27 | DRG: 237 | Disposition: A | Discharge status: Home / Self Care | Payer: PRIVATE HEALTH INSURANCE | Source: Ambulatory Visit | Attending: Vascular Surgery | Admitting: Vascular Surgery

## 2013-04-24 ENCOUNTER — Encounter (HOSPITAL_COMMUNITY): Payer: PRIVATE HEALTH INSURANCE | Admitting: Certified Registered"

## 2013-04-24 DIAGNOSIS — Z79899 Other long term (current) drug therapy: Secondary | ICD-10-CM

## 2013-04-24 DIAGNOSIS — I70219 Atherosclerosis of native arteries of extremities with intermittent claudication, unspecified extremity: Secondary | ICD-10-CM

## 2013-04-24 DIAGNOSIS — I120 Hypertensive chronic kidney disease with stage 5 chronic kidney disease or end stage renal disease: Secondary | ICD-10-CM | POA: Diagnosis present

## 2013-04-24 DIAGNOSIS — E7800 Pure hypercholesterolemia, unspecified: Secondary | ICD-10-CM | POA: Diagnosis present

## 2013-04-24 DIAGNOSIS — E785 Hyperlipidemia, unspecified: Secondary | ICD-10-CM | POA: Diagnosis present

## 2013-04-24 DIAGNOSIS — D649 Anemia, unspecified: Secondary | ICD-10-CM | POA: Diagnosis present

## 2013-04-24 DIAGNOSIS — I739 Peripheral vascular disease, unspecified: Secondary | ICD-10-CM

## 2013-04-24 DIAGNOSIS — I70209 Unspecified atherosclerosis of native arteries of extremities, unspecified extremity: Principal | ICD-10-CM | POA: Diagnosis present

## 2013-04-24 DIAGNOSIS — N401 Enlarged prostate with lower urinary tract symptoms: Secondary | ICD-10-CM | POA: Diagnosis present

## 2013-04-24 DIAGNOSIS — Z940 Kidney transplant status: Secondary | ICD-10-CM

## 2013-04-24 DIAGNOSIS — N138 Other obstructive and reflux uropathy: Secondary | ICD-10-CM | POA: Diagnosis present

## 2013-04-24 DIAGNOSIS — N318 Other neuromuscular dysfunction of bladder: Secondary | ICD-10-CM | POA: Diagnosis present

## 2013-04-24 DIAGNOSIS — Z87891 Personal history of nicotine dependence: Secondary | ICD-10-CM

## 2013-04-24 DIAGNOSIS — N186 End stage renal disease: Secondary | ICD-10-CM | POA: Diagnosis present

## 2013-04-24 DIAGNOSIS — Z992 Dependence on renal dialysis: Secondary | ICD-10-CM

## 2013-04-24 DIAGNOSIS — Z7982 Long term (current) use of aspirin: Secondary | ICD-10-CM

## 2013-04-24 DIAGNOSIS — K219 Gastro-esophageal reflux disease without esophagitis: Secondary | ICD-10-CM | POA: Diagnosis present

## 2013-04-24 HISTORY — PX: FEMORAL-POPLITEAL BYPASS GRAFT: SHX937

## 2013-04-24 HISTORY — PX: INTRAOPERATIVE ARTERIOGRAM: SHX5157

## 2013-04-24 LAB — URINALYSIS, ROUTINE W REFLEX MICROSCOPIC
Hgb urine dipstick: NEGATIVE
Leukocytes, UA: NEGATIVE
Nitrite: NEGATIVE
Protein, ur: NEGATIVE mg/dL
Specific Gravity, Urine: 1.01 (ref 1.005–1.030)
Urobilinogen, UA: 0.2 mg/dL (ref 0.0–1.0)

## 2013-04-24 LAB — CREATININE, SERUM
Creatinine, Ser: 1.09 mg/dL (ref 0.50–1.35)
GFR calc Af Amer: 85 mL/min — ABNORMAL LOW (ref >90)
GFR calc non Af Amer: 74 mL/min — ABNORMAL LOW (ref >90)

## 2013-04-24 LAB — CBC
HCT: 37.3 % — ABNORMAL LOW (ref 39.0–52.0)
Hemoglobin: 12.8 g/dL — ABNORMAL LOW (ref 13.0–17.0)
MCH: 30.8 pg (ref 26.0–34.0)
MCHC: 34.3 g/dL (ref 30.0–36.0)
WBC: 7.7 10*3/uL (ref 4.0–10.5)

## 2013-04-24 SURGERY — BYPASS GRAFT FEMORAL-POPLITEAL ARTERY
Anesthesia: General | Site: Leg Upper | Laterality: Left

## 2013-04-24 MED ORDER — MYCOPHENOLATE MOFETIL 250 MG PO CAPS
500.0000 mg | ORAL_CAPSULE | Freq: Two times a day (BID) | ORAL | Status: DC
  Administered 2013-04-24 – 2013-04-27 (×6): 500 mg via ORAL
  Filled 2013-04-24 (×7): qty 2

## 2013-04-24 MED ORDER — OXYCODONE HCL 5 MG PO TABS
ORAL_TABLET | ORAL | Status: AC
  Filled 2013-04-24: qty 1

## 2013-04-24 MED ORDER — OXYCODONE-ACETAMINOPHEN 5-325 MG PO TABS
1.0000 | ORAL_TABLET | ORAL | Status: DC | PRN
  Administered 2013-04-24 – 2013-04-27 (×9): 2 via ORAL
  Filled 2013-04-24 (×9): qty 2

## 2013-04-24 MED ORDER — OXYCODONE HCL 5 MG/5ML PO SOLN: 5.0000 mg | Freq: Once | ORAL | Status: AC | PRN

## 2013-04-24 MED ORDER — TACROLIMUS 1 MG PO CAPS
3.0000 mg | ORAL_CAPSULE | Freq: Two times a day (BID) | ORAL | Status: DC
  Administered 2013-04-24 – 2013-04-27 (×6): 3 mg via ORAL
  Filled 2013-04-24 (×7): qty 3

## 2013-04-24 MED ORDER — GUAIFENESIN-DM 100-10 MG/5ML PO SYRP
15.0000 mL | ORAL_SOLUTION | ORAL | Status: DC | PRN
  Filled 2013-04-24: qty 15

## 2013-04-24 MED ORDER — DOCUSATE SODIUM 100 MG PO CAPS
100.0000 mg | ORAL_CAPSULE | Freq: Every day | ORAL | Status: DC
  Administered 2013-04-25 – 2013-04-27 (×3): 100 mg via ORAL
  Filled 2013-04-24 (×3): qty 1

## 2013-04-24 MED ORDER — ATORVASTATIN CALCIUM 10 MG PO TABS
10.0000 mg | ORAL_TABLET | Freq: Every day | ORAL | Status: DC
  Administered 2013-04-25 – 2013-04-26 (×2): 10 mg via ORAL
  Filled 2013-04-24 (×3): qty 1

## 2013-04-24 MED ORDER — HEPARIN SODIUM (PORCINE) 1000 UNIT/ML IJ SOLN
INTRAMUSCULAR | Status: DC | PRN
  Administered 2013-04-24 (×2): 2000 [IU] via INTRAVENOUS
  Administered 2013-04-24: 6000 [IU] via INTRAVENOUS
  Administered 2013-04-24: 2000 [IU] via INTRAVENOUS

## 2013-04-24 MED ORDER — HYDRALAZINE HCL 20 MG/ML IJ SOLN: 10.0000 mg | INTRAMUSCULAR | Status: DC | PRN

## 2013-04-24 MED ORDER — OXYCODONE HCL 5 MG PO TABS
5.0000 mg | ORAL_TABLET | Freq: Once | ORAL | Status: AC | PRN
  Administered 2013-04-24: 5 mg via ORAL

## 2013-04-24 MED ORDER — LACTATED RINGERS IV SOLN
INTRAVENOUS | Status: DC | PRN
  Administered 2013-04-24 (×3): via INTRAVENOUS

## 2013-04-24 MED ORDER — NIFEDIPINE ER 30 MG PO TB24
30.0000 mg | ORAL_TABLET | Freq: Two times a day (BID) | ORAL | Status: DC
  Administered 2013-04-24 – 2013-04-27 (×6): 30 mg via ORAL
  Filled 2013-04-24 (×7): qty 1

## 2013-04-24 MED ORDER — DOPAMINE-DEXTROSE 3.2-5 MG/ML-% IV SOLN: 3.0000 ug/kg/min | INTRAVENOUS | Status: DC

## 2013-04-24 MED ORDER — PANTOPRAZOLE SODIUM 40 MG PO TBEC
40.0000 mg | DELAYED_RELEASE_TABLET | Freq: Every day | ORAL | Status: DC
  Administered 2013-04-24 – 2013-04-27 (×4): 40 mg via ORAL
  Filled 2013-04-24 (×2): qty 1

## 2013-04-24 MED ORDER — MORPHINE SULFATE 4 MG/ML IJ SOLN
3.0000 mg | INTRAMUSCULAR | Status: DC | PRN
  Administered 2013-04-24 – 2013-04-25 (×3): 4 mg via INTRAVENOUS
  Filled 2013-04-24 (×2): qty 1

## 2013-04-24 MED ORDER — PROMETHAZINE HCL 25 MG/ML IJ SOLN: 6.2500 mg | INTRAMUSCULAR | Status: DC | PRN

## 2013-04-24 MED ORDER — ZOLPIDEM TARTRATE 5 MG PO TABS
10.0000 mg | ORAL_TABLET | Freq: Every evening | ORAL | Status: DC | PRN
Start: 2013-04-24 — End: 2013-04-27
  Administered 2013-04-25 – 2013-04-26 (×2): 10 mg via ORAL
  Filled 2013-04-24 (×2): qty 2

## 2013-04-24 MED ORDER — THROMBIN 20000 UNITS EX SOLR
CUTANEOUS | Status: AC
  Filled 2013-04-24: qty 20000

## 2013-04-24 MED ORDER — PHENYLEPHRINE HCL 10 MG/ML IJ SOLN
INTRAMUSCULAR | Status: DC | PRN
  Administered 2013-04-24 (×6): 80 ug via INTRAVENOUS

## 2013-04-24 MED ORDER — CYANOCOBALAMIN 500 MCG PO TABS
500.0000 ug | ORAL_TABLET | Freq: Every day | ORAL | Status: DC
  Administered 2013-04-24 – 2013-04-27 (×4): 500 ug via ORAL
  Filled 2013-04-24 (×4): qty 1

## 2013-04-24 MED ORDER — HYDROMORPHONE HCL PF 1 MG/ML IJ SOLN
INTRAMUSCULAR | Status: AC
  Filled 2013-04-24: qty 1

## 2013-04-24 MED ORDER — ACETAMINOPHEN 650 MG RE SUPP: 325.0000 mg | RECTAL | Status: DC | PRN

## 2013-04-24 MED ORDER — MORPHINE SULFATE 4 MG/ML IJ SOLN
INTRAMUSCULAR | Status: AC
  Administered 2013-04-24: 4 mg via INTRAVENOUS
  Filled 2013-04-24: qty 1

## 2013-04-24 MED ORDER — SODIUM CHLORIDE 0.9 % IV SOLN: 500.0000 mL | Freq: Once | INTRAVENOUS | Status: AC | PRN

## 2013-04-24 MED ORDER — MAGNESIUM SULFATE 40 MG/ML IJ SOLN
2.0000 g | Freq: Every day | INTRAMUSCULAR | Status: DC | PRN
  Filled 2013-04-24: qty 50

## 2013-04-24 MED ORDER — FENTANYL CITRATE 0.05 MG/ML IJ SOLN
INTRAMUSCULAR | Status: DC | PRN
  Administered 2013-04-24: 50 ug via INTRAVENOUS
  Administered 2013-04-24 (×2): 25 ug via INTRAVENOUS
  Administered 2013-04-24 (×5): 50 ug via INTRAVENOUS
  Administered 2013-04-24: 100 ug via INTRAVENOUS
  Administered 2013-04-24: 50 ug via INTRAVENOUS

## 2013-04-24 MED ORDER — DEXTROSE 5 % IV SOLN
1.5000 g | INTRAVENOUS | Status: AC
  Administered 2013-04-24: 1.5 g via INTRAVENOUS
  Filled 2013-04-24: qty 1.5

## 2013-04-24 MED ORDER — LIDOCAINE HCL (CARDIAC) 20 MG/ML IV SOLN
INTRAVENOUS | Status: DC | PRN
  Administered 2013-04-24: 100 mg via INTRAVENOUS

## 2013-04-24 MED ORDER — TAMSULOSIN HCL 0.4 MG PO CAPS
0.4000 mg | ORAL_CAPSULE | Freq: Every day | ORAL | Status: DC
  Administered 2013-04-25 – 2013-04-27 (×3): 0.4 mg via ORAL
  Filled 2013-04-24 (×3): qty 1

## 2013-04-24 MED ORDER — IOHEXOL 300 MG/ML  SOLN
INTRAMUSCULAR | Status: DC | PRN
  Administered 2013-04-24: 50 mL via INTRAVENOUS

## 2013-04-24 MED ORDER — MEPERIDINE HCL 25 MG/ML IJ SOLN: 6.2500 mg | INTRAMUSCULAR | Status: DC | PRN

## 2013-04-24 MED ORDER — MYCOPHENOLATE MOFETIL 250 MG PO CAPS: 500.0000 mg | ORAL_CAPSULE | Freq: Two times a day (BID) | ORAL | Status: DC

## 2013-04-24 MED ORDER — TACROLIMUS 1 MG PO CAPS
3.0000 mg | ORAL_CAPSULE | Freq: Two times a day (BID) | ORAL | Status: DC
Start: 2013-04-24 — End: 2013-04-24

## 2013-04-24 MED ORDER — VITAMIN C 500 MG PO TABS
500.0000 mg | ORAL_TABLET | Freq: Every day | ORAL | Status: DC
Start: 2013-04-24 — End: 2013-04-27
  Administered 2013-04-24 – 2013-04-27 (×4): 500 mg via ORAL
  Filled 2013-04-24 (×4): qty 1

## 2013-04-24 MED ORDER — ROCURONIUM BROMIDE 100 MG/10ML IV SOLN
INTRAVENOUS | Status: DC | PRN
  Administered 2013-04-24: 50 mg via INTRAVENOUS

## 2013-04-24 MED ORDER — HYDROMORPHONE HCL PF 1 MG/ML IJ SOLN
0.2500 mg | INTRAMUSCULAR | Status: DC | PRN
  Administered 2013-04-24 (×4): 0.5 mg via INTRAVENOUS

## 2013-04-24 MED ORDER — PHENOL 1.4 % MT LIQD: 1.0000 | OROMUCOSAL | Status: DC | PRN

## 2013-04-24 MED ORDER — LABETALOL HCL 5 MG/ML IV SOLN
10.0000 mg | INTRAVENOUS | Status: DC | PRN
  Filled 2013-04-24: qty 4

## 2013-04-24 MED ORDER — SURGIFOAM 100 EX MISC
CUTANEOUS | Status: DC | PRN
  Administered 2013-04-24 (×2): via TOPICAL

## 2013-04-24 MED ORDER — BISACODYL 10 MG RE SUPP: 10.0000 mg | Freq: Every day | RECTAL | Status: DC | PRN

## 2013-04-24 MED ORDER — ARTIFICIAL TEARS OP OINT
TOPICAL_OINTMENT | OPHTHALMIC | Status: DC | PRN
  Administered 2013-04-24: 1 via OPHTHALMIC

## 2013-04-24 MED ORDER — ONDANSETRON HCL 4 MG/2ML IJ SOLN: 4.0000 mg | Freq: Four times a day (QID) | INTRAMUSCULAR | Status: DC | PRN

## 2013-04-24 MED ORDER — MAGNESIUM OXIDE 400 (241.3 MG) MG PO TABS
400.0000 mg | ORAL_TABLET | Freq: Two times a day (BID) | ORAL | Status: DC
  Administered 2013-04-24 – 2013-04-27 (×6): 400 mg via ORAL
  Filled 2013-04-24 (×7): qty 1

## 2013-04-24 MED ORDER — FINASTERIDE 5 MG PO TABS
5.0000 mg | ORAL_TABLET | Freq: Every day | ORAL | Status: DC
  Administered 2013-04-25 – 2013-04-27 (×3): 5 mg via ORAL
  Filled 2013-04-24 (×4): qty 1

## 2013-04-24 MED ORDER — METOPROLOL TARTRATE 1 MG/ML IV SOLN: 2.0000 mg | INTRAVENOUS | Status: DC | PRN

## 2013-04-24 MED ORDER — DEXTROSE 5 % IV SOLN
1.5000 g | Freq: Two times a day (BID) | INTRAVENOUS | Status: AC
  Administered 2013-04-24 – 2013-04-25 (×2): 1.5 g via INTRAVENOUS
  Filled 2013-04-24 (×2): qty 1.5

## 2013-04-24 MED ORDER — ACETAMINOPHEN 325 MG PO TABS: 325.0000 mg | ORAL_TABLET | ORAL | Status: DC | PRN

## 2013-04-24 MED ORDER — SODIUM CHLORIDE 0.9 % IR SOLN
Status: DC | PRN
  Administered 2013-04-24: 08:00:00

## 2013-04-24 MED ORDER — POTASSIUM CHLORIDE CRYS ER 20 MEQ PO TBCR: 20.0000 meq | EXTENDED_RELEASE_TABLET | Freq: Every day | ORAL | Status: DC | PRN

## 2013-04-24 MED ORDER — ENOXAPARIN SODIUM 40 MG/0.4ML ~~LOC~~ SOLN
40.0000 mg | SUBCUTANEOUS | Status: DC
  Administered 2013-04-24 – 2013-04-26 (×3): 40 mg via SUBCUTANEOUS
  Filled 2013-04-24 (×5): qty 0.4

## 2013-04-24 MED ORDER — MIDAZOLAM HCL 5 MG/5ML IJ SOLN
INTRAMUSCULAR | Status: DC | PRN
  Administered 2013-04-24: 2 mg via INTRAVENOUS

## 2013-04-24 MED ORDER — ONDANSETRON HCL 4 MG/2ML IJ SOLN
INTRAMUSCULAR | Status: DC | PRN
  Administered 2013-04-24: 4 mg via INTRAVENOUS

## 2013-04-24 MED ORDER — PROPOFOL 10 MG/ML IV BOLUS
INTRAVENOUS | Status: DC | PRN
  Administered 2013-04-24: 150 mg via INTRAVENOUS
  Administered 2013-04-24: 50 mg via INTRAVENOUS

## 2013-04-24 MED ORDER — ALUM & MAG HYDROXIDE-SIMETH 200-200-20 MG/5ML PO SUSP
15.0000 mL | ORAL | Status: DC | PRN
  Administered 2013-04-25 – 2013-04-26 (×2): 30 mL via ORAL
  Filled 2013-04-24: qty 30

## 2013-04-24 MED ORDER — 0.9 % SODIUM CHLORIDE (POUR BTL) OPTIME
TOPICAL | Status: DC | PRN
  Administered 2013-04-24 (×2): 1000 mL

## 2013-04-24 MED ORDER — SODIUM CHLORIDE 0.9 % IV SOLN
INTRAVENOUS | Status: DC
  Administered 2013-04-24: 125 mL/h via INTRAVENOUS
  Administered 2013-04-25: 01:00:00 via INTRAVENOUS

## 2013-04-24 MED ORDER — ASPIRIN 325 MG PO TABS
325.0000 mg | ORAL_TABLET | Freq: Two times a day (BID) | ORAL | Status: DC
Start: 2013-04-24 — End: 2013-04-27
  Administered 2013-04-24 – 2013-04-27 (×6): 325 mg via ORAL
  Filled 2013-04-24 (×7): qty 1

## 2013-04-24 MED ORDER — SENNOSIDES-DOCUSATE SODIUM 8.6-50 MG PO TABS
1.0000 | ORAL_TABLET | Freq: Every evening | ORAL | Status: DC | PRN
  Filled 2013-04-24: qty 1

## 2013-04-24 MED ORDER — PROTAMINE SULFATE 10 MG/ML IV SOLN
INTRAVENOUS | Status: DC | PRN
  Administered 2013-04-24 (×5): 10 mg via INTRAVENOUS

## 2013-04-24 SURGICAL SUPPLY — 77 items
ADH SKN CLS APL DERMABOND .7 (GAUZE/BANDAGES/DRESSINGS) ×2
ADH SKN CLS LQ APL DERMABOND (GAUZE/BANDAGES/DRESSINGS) ×1
BANDAGE ELASTIC 4 VELCRO ST LF (GAUZE/BANDAGES/DRESSINGS) IMPLANT
BANDAGE ESMARK 6X9 LF (GAUZE/BANDAGES/DRESSINGS) IMPLANT
BNDG CMPR 9X6 STRL LF SNTH (GAUZE/BANDAGES/DRESSINGS)
BNDG ESMARK 6X9 LF (GAUZE/BANDAGES/DRESSINGS)
CANISTER SUCTION 2500CC (MISCELLANEOUS) ×2 IMPLANT
CLIP TI MEDIUM 24 (CLIP) ×2 IMPLANT
CLIP TI WIDE RED SMALL 24 (CLIP) ×2 IMPLANT
COVER PROBE W GEL 5X96 (DRAPES) ×2 IMPLANT
COVER SURGICAL LIGHT HANDLE (MISCELLANEOUS) ×2 IMPLANT
CUFF TOURNIQUET SINGLE 24IN (TOURNIQUET CUFF) IMPLANT
CUFF TOURNIQUET SINGLE 34IN LL (TOURNIQUET CUFF) IMPLANT
CUFF TOURNIQUET SINGLE 44IN (TOURNIQUET CUFF) IMPLANT
DERMABOND ADHESIVE PROPEN (GAUZE/BANDAGES/DRESSINGS) ×1
DERMABOND ADVANCED (GAUZE/BANDAGES/DRESSINGS) ×2
DERMABOND ADVANCED .7 DNX12 (GAUZE/BANDAGES/DRESSINGS) IMPLANT
DERMABOND ADVANCED .7 DNX6 (GAUZE/BANDAGES/DRESSINGS) IMPLANT
DRAIN CHANNEL 15F RND FF W/TCR (WOUND CARE) IMPLANT
DRAPE C-ARM 42X72 X-RAY (DRAPES) ×1 IMPLANT
DRAPE PROXIMA HALF (DRAPES) ×1 IMPLANT
DRAPE WARM FLUID 44X44 (DRAPE) ×2 IMPLANT
DRSG COVADERM 4X10 (GAUZE/BANDAGES/DRESSINGS) IMPLANT
DRSG COVADERM 4X14 (GAUZE/BANDAGES/DRESSINGS) ×1 IMPLANT
DRSG COVADERM 4X6 (GAUZE/BANDAGES/DRESSINGS) ×1 IMPLANT
DRSG COVADERM 4X8 (GAUZE/BANDAGES/DRESSINGS) IMPLANT
ELECT REM PT RETURN 9FT ADLT (ELECTROSURGICAL) ×2
ELECTRODE REM PT RTRN 9FT ADLT (ELECTROSURGICAL) ×1 IMPLANT
EVACUATOR SILICONE 100CC (DRAIN) IMPLANT
GAUZE SPONGE 4X4 16PLY XRAY LF (GAUZE/BANDAGES/DRESSINGS) ×2 IMPLANT
GLOVE BIO SURGEON STRL SZ7 (GLOVE) ×4 IMPLANT
GLOVE BIOGEL PI IND STRL 7.0 (GLOVE) IMPLANT
GLOVE BIOGEL PI IND STRL 7.5 (GLOVE) ×1 IMPLANT
GLOVE BIOGEL PI INDICATOR 7.0 (GLOVE) ×5
GLOVE BIOGEL PI INDICATOR 7.5 (GLOVE) ×4
GLOVE ECLIPSE 6.5 STRL STRAW (GLOVE) ×5 IMPLANT
GLOVE SKINSENSE NS SZ7.0 (GLOVE) ×3
GLOVE SKINSENSE STRL SZ7.0 (GLOVE) IMPLANT
GOWN BRE IMP SLV AUR XL STRL (GOWN DISPOSABLE) ×2 IMPLANT
GOWN STRL NON-REIN LRG LVL3 (GOWN DISPOSABLE) ×11 IMPLANT
INSERT FOGARTY SM (MISCELLANEOUS) ×1 IMPLANT
KIT BASIN OR (CUSTOM PROCEDURE TRAY) ×2 IMPLANT
KIT ROOM TURNOVER OR (KITS) ×2 IMPLANT
LOOP VESSEL MINI RED (MISCELLANEOUS) ×1 IMPLANT
MARKER GRAFT CORONARY BYPASS (MISCELLANEOUS) IMPLANT
NS IRRIG 1000ML POUR BTL (IV SOLUTION) ×4 IMPLANT
PACK PERIPHERAL VASCULAR (CUSTOM PROCEDURE TRAY) ×2 IMPLANT
PAD ARMBOARD 7.5X6 YLW CONV (MISCELLANEOUS) ×4 IMPLANT
PADDING CAST COTTON 6X4 STRL (CAST SUPPLIES) IMPLANT
PATCH VASCULAR VASCU GUARD 1X6 (Vascular Products) ×1 IMPLANT
SET MICROPUNCTURE 5F STIFF (MISCELLANEOUS) ×1 IMPLANT
SPONGE INTESTINAL PEANUT (DISPOSABLE) ×1 IMPLANT
SPONGE LAP 18X18 X RAY DECT (DISPOSABLE) ×1 IMPLANT
SPONGE SURGIFOAM ABS GEL 100 (HEMOSTASIS) ×1 IMPLANT
STAPLER VISISTAT 35W (STAPLE) ×1 IMPLANT
STOPCOCK 4 WAY LG BORE MALE ST (IV SETS) ×1 IMPLANT
SUT ETHILON 3 0 PS 1 (SUTURE) IMPLANT
SUT GORETEX 5 0 TT13 24 (SUTURE) IMPLANT
SUT GORETEX 6.0 TT13 (SUTURE) IMPLANT
SUT MNCRL AB 4-0 PS2 18 (SUTURE) ×5 IMPLANT
SUT PROLENE 5 0 C 1 24 (SUTURE) ×3 IMPLANT
SUT PROLENE 6 0 BV (SUTURE) ×10 IMPLANT
SUT PROLENE 7 0 BV 1 (SUTURE) ×3 IMPLANT
SUT SILK 2 0 FS (SUTURE) ×1 IMPLANT
SUT SILK 2 0 SH (SUTURE) ×1 IMPLANT
SUT SILK 3 0 (SUTURE) ×6
SUT SILK 3-0 18XBRD TIE 12 (SUTURE) IMPLANT
SUT VIC AB 2-0 CT1 27 (SUTURE) ×4
SUT VIC AB 2-0 CT1 TAPERPNT 27 (SUTURE) ×2 IMPLANT
SUT VIC AB 3-0 SH 27 (SUTURE) ×4
SUT VIC AB 3-0 SH 27X BRD (SUTURE) ×3 IMPLANT
TOWEL OR 17X24 6PK STRL BLUE (TOWEL DISPOSABLE) ×4 IMPLANT
TOWEL OR 17X26 10 PK STRL BLUE (TOWEL DISPOSABLE) ×4 IMPLANT
TRAY FOLEY CATH 16FRSI W/METER (SET/KITS/TRAYS/PACK) ×2 IMPLANT
TUBING EXTENTION W/L.L. (IV SETS) IMPLANT
UNDERPAD 30X30 INCONTINENT (UNDERPADS AND DIAPERS) ×2 IMPLANT
WATER STERILE IRR 1000ML POUR (IV SOLUTION) ×2 IMPLANT

## 2013-04-24 NOTE — Progress Notes (Signed)
Utilization review completed.  

## 2013-04-24 NOTE — Consult Note (Signed)
[facility] Renal Consultation Note  Requesting MD: Dr. Bridgett Larsson Indication for Consultation: Management transplant-related medications  HPI:  Scott Chavez is a 57 y.o. male known to me with ESRD secondary to hypertension. He is status post a candidate for renal transplant done in December of 2010 at [facility].  Fortunately, he's had an extremely uneventful post transplant course with the exception of urinary retention for which he does when necessary and scheduled in and out Foley catheterizations. I follow him in the office. The last time he was seen was in September of 2014. At that time he was complaining of claudication so was sent to vascular for evaluation. He is now status post a femoropopliteal bypass and also a left iliofemoral endarterectomy. We are asked to evaluate his transplant-related medications and follow him along during this hospitalization. Labs done in the office in October of 2014 creatinine was 1.03. His current immune suppressing drugs are Prograf 3 mg twice daily and  CellCept 500 mg twice daily. His urinalysis was clean preoperatively. He looks very good postoperatively.  Creatinine, Ser  Date/Time Value Range Status  04/19/2013 11:04 AM 1.05  0.50 - 1.35 mg/dL Final  03/09/2013  6:16 AM 1.20  0.50 - 1.35 mg/dL Final  03/17/2007 11:10 AM 11.06*  Final     PMHx:   Past Medical History  Diagnosis Date  . History of renal failure   . Hemodialysis patient   . Hypertension   . Hypercholesteremia   . Bladder dysfunction   . Peripheral vascular disease   . Aorto-iliac disease   . Heartburn   . ESRD (end stage renal disease)     trans[facility]- [facility] , followed by Dr. Moshe Cipro   . GERD (gastroesophageal reflux disease)   . Anemia     pt. not sure, but reports he is taking Fe for one month now.  . Self-catheterizes urinary bladder     pt. choice, but he reports that he desires to do this to avoid retention     Past Surgical  History  Procedure Laterality Date  . Av fistula placement    . Nephrectomy recipient    . S/p cadaveric renal transplant  December 2010    [facility]    Family Hx:  Family History  Problem Relation Age of Onset  . Hypertension    . Hypertension Father   . Other Father     amputation    Social History:  reports that he quit smoking about 10 years ago. He does not have any smokeless tobacco history on file. He reports that he uses illicit drugs. He reports that he does not drink alcohol.  Allergies:  Allergies  Allergen Reactions  . Tape Rash    Silk tape    Medications: Prior to Admission medications   Medication Sig Start Date End Date Taking? Authorizing Provider  aspirin 325 MG tablet Take 325 mg by mouth 2 (two) times daily.    Yes Historical Provider, MD  finasteride (PROSCAR) 5 MG tablet Take 5 mg by mouth daily before breakfast.    Yes Historical Provider, MD  magnesium oxide (MAG-OX) 400 MG tablet Take 400 mg by mouth 2 (two) times daily.    Yes Historical Provider, MD  mycophenolate (CELLCEPT) 250 MG capsule Take 500 mg by mouth 2 (two) times daily.    Yes Historical Provider, MD  NIFEdipine (PROCARDIA-XL/ADALAT-CC/NIFEDICAL-XL) 30 MG 24 hr tablet Take 30 mg by mouth 2 (two) times daily.   Yes Historical  Provider, MD  omeprazole (PRILOSEC) 20 MG capsule Take 20 mg by mouth 2 (two) times daily before a meal.   Yes Historical Provider, MD  rosuvastatin (CRESTOR) 5 MG tablet Take 5 mg by mouth daily before breakfast.    Yes Historical Provider, MD  tacrolimus (PROGRAF) 1 MG capsule Take 3 mg by mouth 2 (two) times daily.   Yes Historical Provider, MD  tadalafil (CIALIS) 20 MG tablet Take 20 mg by mouth daily as needed for erectile dysfunction.   Yes Historical Provider, MD  tamsulosin (FLOMAX) 0.4 MG CAPS capsule Take 0.4 mg by mouth daily.   Yes Historical Provider, MD  vitamin B-12 (CYANOCOBALAMIN) 500 MCG tablet Take 500 mcg by mouth daily.   Yes Historical Provider,  MD  vitamin C (ASCORBIC ACID) 500 MG tablet Take 500 mg by mouth daily.   Yes Historical Provider, MD  zolpidem (AMBIEN) 10 MG tablet Take 10 mg by mouth at bedtime as needed for sleep.   Yes Historical Provider, MD  b complex vitamins capsule Take 1 capsule by mouth daily.    Historical Provider, MD    I have reviewed the patient's current medications.  Labs:  Results for orders placed during the hospital encounter of 04/24/13 (from the past 48 hour(s))  URINALYSIS, ROUTINE W REFLEX MICROSCOPIC     Status: None   Collection Time    04/24/13  6:11 AM      Result Value Range   Color, Urine YELLOW  YELLOW   APPearance CLEAR  CLEAR   Specific Gravity, Urine 1.010  1.005 - 1.030   pH 6.0  5.0 - 8.0   Glucose, UA NEGATIVE  NEGATIVE mg/dL   Hgb urine dipstick NEGATIVE  NEGATIVE   Bilirubin Urine NEGATIVE  NEGATIVE   Ketones, ur NEGATIVE  NEGATIVE mg/dL   Protein, ur NEGATIVE  NEGATIVE mg/dL   Urobilinogen, UA 0.2  0.0 - 1.0 mg/dL   Nitrite NEGATIVE  NEGATIVE   Leukocytes, UA NEGATIVE  NEGATIVE   Comment: MICROSCOPIC NOT DONE ON URINES WITH NEGATIVE PROTEIN, BLOOD, LEUKOCYTES, NITRITE, OR GLUCOSE <1000 mg/dL.     ROS:  A comprehensive review of systems was negative except for: Musculoskeletal: positive for Leg pain at the point of surgery.  Physical Exam: Filed Vitals:   04/24/13 1523  BP:   Pulse: 64  Temp:   Resp: 15     General: Alert, talkative. No acute distress HEENT: Pupils are equal round reactive to light, motions are intact, mucous members are moist Neck: There is no jugular venous, no carotid bruits no lymphadenopathy Heart: Regular rate and rhythm without murmur Lungs: Mostly clear to auscultation bilaterally Abdomen: Soft, nontender, nondistended. Extremities: Postoperative changes. Mild edema Skin: Warm and dry Neuro: Alert and oriented. The remainder of the neurologic exam is nonfocal.  Assessment/Plan: 57 year old black male with ESRD status post anicteric  renal transplant in December of 2010. He's been very stable from a kidney transplant standpoint. He is now postop a left lower leg bypass. 1.Renal- status post cadaveric renal transplant. He's been very stable on his regimen of Prograf and CellCept and those medications have been ordered. I will order chemistries in the morning 2. Hypertension/volume  - patient usually has excellent control of his blood pressure. He is on his home regimen. 3. urinary retention: He'll have an indwelling Foley until tomorrow. He will then need to proceed with his home regimen of in and out bladder catheterizations. 4. Anemia  - no reason to think it would  be an issue. His baseline hemoglobin is good. I will check one in the morning.  Thank you for this consult. We will continue to follow with you.   Jacqualin Shirkey A 04/24/2013, 3:31 PM

## 2013-04-24 NOTE — Op Note (Signed)
OPERATIVE NOTE   PROCEDURE: 1.  Left iliofemoral endarterectomy with bovine patch angioplasty 2.  Left common femoral artery to below-the-knee popliteal artery bypass with non-reverse ipsilateral greater saphenous vein  3.  Left common femoral artery open cannulation 4.  Left leg runoff  PRE-OPERATIVE DIAGNOSIS: lifestyle limit intermittent claudication  POST-OPERATIVE DIAGNOSIS: same as above   SURGEON: [name], MD  ASSISTANT(S): Gerri Lins, PAC   ANESTHESIA: general  ESTIMATED BLOOD LOSS: 250 cc  FINDING(S): 1.  Severely calcified left common femoral artery  2.  Diseased but soft below-the-knee popliteal artery 3.  No technical issues with bypass: runoff via peroneal artery which gives off a posterior tibial artery and anterior tibial artery   SPECIMEN(S):  none  INDICATIONS:   Scott Chavez is a 57 y.o. male who presents with lifestyle limiting intermittent claudication.  His angiogram demonstrated severe left femoral disease in the superficial femoral artery with a high degree stenosis in the popliteal artery at the knee.  Subsequently, I recommended a left femoropopliteal bypass with vein.  The risk, benefits, and alternative for bypass operations were discussed with the patient.  The patient is aware the risks include but are not limited to: bleeding, infection, myocardial infarction, stroke, limb loss, nerve damage, need for additional procedures in the future, wound complications, and inability to complete the bypass.  The patient is aware that his immunosuppression makes him at higher risk for wound complications and poor healing.  The patient is aware of these risks and agreed to proceed.  DESCRIPTION: After full informed written consent was obtained, the patient was brought back to the operating room and placed supine upon the operating table.  Prior to induction, the patient was given intravenous antibiotics.  After obtaining adequate anesthesia, the  patient was prepped and draped in the standard fashion for a femoral to popliteal bypass operation.  Attention was turned to the left groin.  A longitudinal incision was made over the left common femoral artery.  Using blunt dissection and electrocautery, the artery was dissected out from the inguinal ligament down to the femoral bifurcation.  The superficial femoral artery, profunda femoral artery, and external iliac artery were dissected out and vessel loops applied.  Circumflex branches were also dissected and controlled with silk ties.  This common femoral artery was found on exam to be severely calcified and could not be clamped.  I had to extend the dissection into the distal external iliac artery where I could finally find a segment soft enough to clamp.    At this point, attention was turned to the calf.  An longitudinal incision was made one finger-width posterior to the tibia.  Using blunt dissection and electrocautery, a plane was developed through the subcutaneous tissue and fascia down to the popliteal space.  The popliteal vein was dissected out and retracted medially and posteriorly.  The below-the-knee popliteal artery was dissected away from lateral popliteal vein.  This popliteal artery was found on exam to be soft.     At this point, the patient was given 6000 units of Heparin intravenously, which was a therapeutic bolus.  In total, 12000 units of Heparin was administrated to achieve and maintain a therapeutic level of anticoagulation, giving another 2000 units every subsequent hourly.  After waiting three minutes, the external iliac artery, superficial femoral artery and profunda femoral artery were clamped.  An incision was made in the common femoral artery and extended proximally and distally with a Mayo scissor due to the severe  calcification.  I started an endarterectomy in the mid-segment of the common femoral artery and then carried it proximally.  I extract the circumferential plaque  from the proximal common femoral artery and distal external iliac artery.  I then carried this dissection down the femoral bifurcation.  I transected the plaque extending into the superficial femoral artery as the flow into the superficial femoral artery is essentially occluded.  I then carefully extracted the plaque from the profunda femoral artery.  The plaque feathered without any difficulty and the residual plaque was densely adherent.  At this point, I obtained a bovine pericardial patch and fashioned it for the geometry of this arteriotomy.  The patch was sewn into place with two running 6-0 prolene stitches.  Prior to completing this patch angioplasty, I backbled all arteries and no thrombus was present.  At this point, thombin and gelfoam was applied to the left patched iliofemoral artery.   At this point, the patient's left greater saphenous vein was identified under Sonosite guidance.  Skip incisions was made over the greater saphenous vein from the saphenofemoral junction down to calf.  The vein conduit was found to be adequate with size: 3-5 mm.  Side branches of greater saphenous vein were tied off with 4-0 silk or clipped with small titanium clips.  The saphenofemoral junction was clamped and the the greater saphenous vein was transected.  The saphenofemoral junction was oversewn in a double layer with a running stitch of 5-0 prolene.  The distal extent of this conduit was tied off at the level of mid-calf and the conduit transected proximally.  The harvest vein conduit was soaked in a heparinized saline solution.  A vessel cannula was tied to the distal end of the vein and the vein was tested by hydrodistension.  Leaks in the conduit were repaired with 4-0 silk ties and 7-0 Prolene stitches.  At the end of this process, I felt the conduit to be:  adequate quality.  I turned my attention back to the left groin.  The proximal conduit was was spatulated to the dimensions of the arteriotomy.  I  sharply lysed several valves.  The conduit was sewn to the common femoral artery with a running stitch of 5-0 prolene.  Prior to completing this anastomosis, all vessels were backbled.  No thrombus was noted from any vessels and backbleeding was: vigorous.  The anastomosis was completed in the usual fashion.  At this point, I pass a valvulotomy three times to lyse valves.  Some injuries to the vein occurred, requiring repair with a 7-0 Prolene.  There is severe patches of synechia within the veins, which complicated the lysis.  I marked the anterior surface of this vein with a surgical marker.    At this point, I bluntly dissected a space between the femoral condyles adjacent to the below-the-knee popliteal vessels.  I bluntly passed the long Gore metal tunneler between the femoral condyles in a subsartorial fashion to the groin incision.  The bullet on the tunneler was removed and then the vein conduit was sewn to the inner cannula with a 2-0 Silk.  I passed the conduit through the metal tunnel, taking care to maintain the orientation of the conduit.    Attention was then turned to the popliteal exposure.   I reset the exposure of the popliteal space.  I verified the popliteal artery was appropriately marked.  I determine the target segment for the anastomosis.  I applied tension to the artery proximally and distally  with vessel loops.  I made an incision with a 11-blade in the artery and extended it proximally and distally with a Potts scissor.  The below-the-knee popliteal artery had a posterior plaque occupying less than 50% f the lumen.  I pulled the conduit to appropriate tension and length, taking into account straightening out the leg.  I adjusted the length of the conduit sharply.  I spatulated this conduit to meet the dimensions of the arteriotomy.  The conduit was sewn to the common femoral artery with a running stitch of 6-0 prolene.  Prior to completing this anastomosis, all vessels were backbled.   No thrombus was noted from any vessels and backbleeding was: limited.  The bypass conduit was allowed to bleed in an antegrade fashion.  The bleeding was: pulsatile.  The anastomosis was completed in the usual fashion.  At this point, all incisions were washed out and thrombin and gelfoam was placed into both incisions.  The continuous doppler exam demonstrated distally: faint peroneal signal.    Subsequently, I felt an angiogram was going to be necessary.  I cannulated the patched common femoral artery with a micropuncture needle and passed a wire into the external iliac artery.  The needle was exchanged for a microsheath was loaded into the external iliac artery.  The sheath was connected to IV extension tubing.  Due the table, proximal injections could not be completed.  I imaged the distal anastomosis and tibial runoff.  The distal anastomosis was widely patent with no evidence of a technical defect.  Runoff is via peroneal artery with collateral feeding a posterior tibial artery and anterior tibial artery.  I repaired the cannulation site with a Z-stitch of 6-0 Prolene.  I tied down the stitch while pulling the sheath.    At this point, the patient was given 50 mg of Protamine.  Bleeding in all incisions were controlled with electrocautery and suture ligature.  After another round of thrombin and gelfoam, no further active bleeding was present.  I washed out all incisions.  The calf was repaired with interrupted deep sutures to reapproximate the deep muscles and a running stitch of 3-0 Vicryl in the subcutaneous tissue.  The skin was reapproximated with a running subcuticular of 4-0 Vicryl.  After cleaning and drying the skin, the skin was reinforced with Dermabond.  Attention was turned to the groin.  The groin was repaired with a double layer of 2-0 Vicryl immediately superficial to the bypass conduit.  The superficial subcutaneous tissue was reapproximated with a double layer of 3-0 Vicryl.  The skin  was reapproximated with a running subcuticular of 4-0 Vicryl.  The skin was cleaned, dried, and reinforced with Dermabond.  The vein harvest incisions were closed with a layer of 3-0 Vicryl in the subcutaneous tissue.  The skin was then reapproximated with staples.  The skin was cleaned, dried, and sterile bandages applied.   COMPLICATIONS: none  CONDITION: stable   [name], MD [facility] [place] Office: [phone] Pager: [phone]  04/24/2013, 2:28 PM
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
## 2013-04-24 NOTE — Anesthesia Postprocedure Evaluation (Signed)
Anesthesia Post Note  Patient: Scott Chavez  Procedure(s) Performed: Procedure(s) (LRB): BYPASS GRAFT COMMON FEMORALARTERY-BELOW KNEE POPLITEAL ARTERY WITH GREATER SAPHENOUS VEIN (Left) INTRA OPERATIVE ARTERIOGRAM (Left)  Anesthesia type: general  Patient location: PACU  Post pain: Pain level controlled  Post assessment: Patient's Cardiovascular Status Stable  Post vital signs: Reviewed and stable  Level of consciousness: sedated  Complications: No apparent anesthesia complications

## 2013-04-24 NOTE — Progress Notes (Signed)
Upper denture ret to pt. (Wife has lower, per CRNA's report.)

## 2013-04-24 NOTE — Anesthesia Procedure Notes (Signed)
Procedure Name: Intubation Date/Time: 04/24/2013 7:42 AM Performed by: Barrington Ellison Pre-anesthesia Checklist: Patient identified, Emergency Drugs available, Suction available, Patient being monitored and Timeout performed Patient Re-evaluated:Patient Re-evaluated prior to inductionOxygen Delivery Method: Circle system utilized Preoxygenation: Pre-oxygenation with 100% oxygen Intubation Type: IV induction Ventilation: Mask ventilation without difficulty Laryngoscope Size: Mac and 4 Grade View: Grade I Tube type: Oral Tube size: 7.5 mm Number of attempts: 1 Airway Equipment and Method: Stylet Placement Confirmation: ETT inserted through vocal cords under direct vision,  positive ETCO2 and breath sounds checked- equal and bilateral Secured at: 22 cm Tube secured with: Tape Dental Injury: Teeth and Oropharynx as per pre-operative assessment

## 2013-04-24 NOTE — Transfer of Care (Signed)
Immediate Anesthesia Transfer of Care Note  Patient: Scott Chavez  Procedure(s) Performed: Procedure(s): BYPASS GRAFT COMMON FEMORALARTERY-BELOW KNEE POPLITEAL ARTERY WITH GREATER SAPHENOUS VEIN (Left) INTRA OPERATIVE ARTERIOGRAM (Left)  Patient Location: PACU  Anesthesia Type:General  Level of Consciousness: awake, alert  and oriented  Airway & Oxygen Therapy: Patient Spontanous Breathing and Patient connected to nasal cannula oxygen  Post-op Assessment: Report given to PACU RN and Patient moving all extremities X 4  Post vital signs: Reviewed and stable  Complications: No apparent anesthesia complications

## 2013-04-24 NOTE — Anesthesia Postprocedure Evaluation (Signed)
Anesthesia Post Note  Patient: Scott Chavez  Procedure(s) Performed: Procedure(s) (LRB): BYPASS GRAFT COMMON FEMORALARTERY-BELOW KNEE POPLITEAL ARTERY WITH GREATER SAPHENOUS VEIN (Left) INTRA OPERATIVE ARTERIOGRAM (Left)  Anesthesia type: General  Patient location: PACU  Post pain: Pain level controlled  Post assessment: Post-op Vital signs reviewed  Last Vitals: BP 130/90  Pulse 89  Temp(Src) 37.2 C (Oral)  Resp 19  Ht 5\' 10"  (1.778 m)  Wt 156 lb 12 oz (71.1 kg)  BMI 22.49 kg/m2  SpO2 100%  Post vital signs: Reviewed  Level of consciousness: sedated  Complications: No apparent anesthesia complications

## 2013-04-24 NOTE — Anesthesia Preprocedure Evaluation (Addendum)
Anesthesia Evaluation  Patient identified by MRN, date of birth, ID band Patient awake    Reviewed: Allergy & Precautions, H&P , NPO status , Patient's Chart, lab work & pertinent test results  Airway Mallampati: II TM Distance: >3 FB Neck ROM: Full    Dental  (+) Dental Advisory Given, Edentulous Upper and Partial Lower   Pulmonary former smoker,  breath sounds clear to auscultation        Cardiovascular hypertension, Pt. on medications + Peripheral Vascular Disease Rhythm:Regular Rate:Normal  04/17/2013 Stress MPS Normal stress nuclear study.  No evidence of ischemia.  LV Ejection Fraction: 55%.  LV Wall Motion:  NL LV Function; NL Wall Motion.    Neuro/Psych negative neurological ROS  negative psych ROS   GI/Hepatic Neg liver ROS, GERD-  Medicated,  Endo/Other  negative endocrine ROS  Renal/GU ESRFRenal disease     Musculoskeletal negative musculoskeletal ROS (+)   Abdominal   Peds  Hematology  (+) Blood dyscrasia, anemia ,   Anesthesia Other Findings   Reproductive/Obstetrics negative OB ROS                         Anesthesia Physical Anesthesia Plan  ASA: III  Anesthesia Plan: General   Post-op Pain Management:    Induction: Intravenous  Airway Management Planned:   Additional Equipment: Arterial line  Intra-op Plan:   Post-operative Plan: Extubation in OR  Informed Consent: I have reviewed the patients History and Physical, chart, labs and discussed the procedure including the risks, benefits and alternatives for the proposed anesthesia with the patient or authorized representative who has indicated his/her understanding and acceptance.   Dental advisory given  Plan Discussed with: CRNA  Anesthesia Plan Comments:         Anesthesia Quick Evaluation

## 2013-04-24 NOTE — Preoperative (Signed)
Beta Blockers   Reason not to administer Beta Blockers:Not Applicable 

## 2013-04-24 NOTE — Interval H&P Note (Signed)
[facility] [place]  History and Physical Update  The patient was interviewed and re-examined.  The patient's previous History and Physical has been reviewed and is unchanged.  There is no change in the plan of care: L fem-pop BPG w/ ips GSV.  The risk, benefits, and alternative for bypass operations were discussed with the patient.  The patient is aware the risks include but are not limited to: bleeding, infection, myocardial infarction, stroke, limb loss, nerve damage, need for additional procedures in the future, wound complications, and inability to complete the bypass.  This patient is at increased risk of wound complications due to his active immunosuppression.  The patient is aware of these risks and agreed to proceed.  [name], MD [facility] [place] Office: [phone] Pager: [phone]  04/24/2013, 7:23 AM

## 2013-04-25 ENCOUNTER — Encounter (HOSPITAL_COMMUNITY): Payer: Self-pay | Admitting: Vascular Surgery

## 2013-04-25 LAB — RENAL FUNCTION PANEL
Albumin: 3 g/dL — ABNORMAL LOW (ref 3.5–5.2)
BUN: 8 mg/dL (ref 6–23)
Chloride: 108 mEq/L (ref 96–112)
Creatinine, Ser: 1.08 mg/dL (ref 0.50–1.35)
GFR calc non Af Amer: 74 mL/min — ABNORMAL LOW (ref >90)
Phosphorus: 2 mg/dL — ABNORMAL LOW (ref 2.3–4.6)
Potassium: 3.9 mEq/L (ref 3.5–5.1)

## 2013-04-25 LAB — CBC
Hemoglobin: 12.6 g/dL — ABNORMAL LOW (ref 13.0–17.0)
MCH: 30.1 pg (ref 26.0–34.0)
MCV: 89.7 fL (ref 78.0–100.0)
Platelets: 139 10*3/uL — ABNORMAL LOW (ref 150–400)
RDW: 13.6 % (ref 11.5–15.5)
WBC: 5.4 10*3/uL (ref 4.0–10.5)

## 2013-04-25 MED ORDER — DIPHENHYDRAMINE HCL 12.5 MG/5ML PO ELIX
25.0000 mg | ORAL_SOLUTION | Freq: Four times a day (QID) | ORAL | Status: DC | PRN
  Administered 2013-04-25 – 2013-04-27 (×5): 25 mg via ORAL
  Filled 2013-04-25 (×5): qty 10

## 2013-04-25 NOTE — Progress Notes (Signed)
Report called to RN on 2W, pt transferred in w/c with family at side

## 2013-04-25 NOTE — Progress Notes (Addendum)
[facility] [place]  Subjective  - Doing well.  No complaints other than some pain with movement of the left leg.  Taking PO's well.   Objective 144/84 91 99.4 F (37.4 C) (Oral) 21 99%  Intake/Output Summary (Last 24 hours) at 04/25/13 0723 Last data filed at 04/25/13 0600  Gross per 24 hour  Intake   2840 ml  Output   2065 ml  Net    775 ml    Left leg dressings clean and dry. Palpable DP pulse doppler PT 2+  Assessment/Planning: POD #1 1. Left iliofemoral endarterectomy with bovine patch angioplasty  2. Left common femoral artery to below-the-knee popliteal artery bypass with non-reverse ipsilateral greater saphenous vein  3. Left common femoral artery open cannulation  4. Left leg runoff Foley D/C'd this am no void yet, taking PO's well, ambulated with nursing staff. Transfer to [place], [facility] 04/25/2013 7:23 AM --  Laboratory Lab Results:  Recent Labs  04/24/13 1700 04/25/13 0545  WBC 7.7 5.4  HGB 12.8* 12.6*  HCT 37.3* 37.6*  PLT 147* 139*   BMET  Recent Labs  04/24/13 1700 04/25/13 0545  NA  --  140  K  --  3.9  CL  --  108  CO2  --  23  GLUCOSE  --  108*  BUN  --  8  CREATININE 1.09 1.08  CALCIUM  --  8.8    COAG Lab Results  Component Value Date   INR 0.92 04/19/2013   No results found for this basename: PTT   Addendum  I have independently interviewed and examined the patient, and I agree with the physician assistant's findings.  Dopplerable PT and DP today with warm foot.  Inc x 2 c/d/i.  Vein harvest inc bandaged.  ABI and transfer to floor.  [name], MD [facility] [place] Office: [phone] Pager: [phone]  04/25/2013, 8:11 AM

## 2013-04-25 NOTE — Progress Notes (Signed)
Subjective:       Seen. Doing well. Started up with PT. Plans are being made to move to a floor bed. Creatinine is stable and Foley catheter has been discontinued  Objective Vital signs in last 24 hours: Filed Vitals:   04/24/13 2000 04/24/13 2355 04/25/13 0340 04/25/13 0613  BP: 130/90 151/75 128/66 144/84  Pulse: 89 88 33 91  Temp: 99 F (37.2 C) 99.1 F (37.3 C) 99.3 F (37.4 C) 99.4 F (37.4 C)  TempSrc: Oral Oral Oral Oral  Resp:  17 19 21   Height:      Weight:      SpO2: 100% 96% 98% 99%   Weight change:   Intake/Output Summary (Last 24 hours) at 04/25/13 [id number] Last data filed at 04/25/13 0600  Gross per 24 hour  Intake   2340 ml  Output   1990 ml  Net    350 ml    Assessment/ Plan: Pt is a 57 y.o. yo male status post renal transplant  who was admitted on 04/24/2013 with  claudication status post left lower extremity revascularization.  Assessment/Plan: 1. status post renal transplant: Patient has excellent allograft function. He is back on his home regimen for her immunosuppressive drugs. 2. urinary retention: Foley catheter is been discontinued. Patient does periodic in and out catheterizations. All right in order to make sure that he had supplies if he needs to do that while in house. 3. Hypertension/volume is a little bit higher than his baseline likely secondary to pain. Continue home medication regimen as that usually keeps him under control. 4. anemia: Not an issue at this time 5. disposition: Suspect will be discharged in the next 24-48 hours. I will not see him daily but please call if there are any concerns.  I will order chemistries on Friday which is the day he believes that he will be discharged. Patient has followup to see me next week.  Marvell Tamer A    Labs: Basic Metabolic Panel:  Recent Labs Lab 04/19/13 1104 04/24/13 1700 04/25/13 0545  NA 137  --  140  K 4.3  --  3.9  CL 104  --  108  CO2 25  --  23  GLUCOSE 107*  --  108*  BUN 9   --  8  CREATININE 1.05 1.09 1.08  CALCIUM 9.6  --  8.8  PHOS  --   --  2.0*   Liver Function Tests:  Recent Labs Lab 04/19/13 1104 04/25/13 0545  AST 33  --   ALT 22  --   ALKPHOS 58  --   BILITOT 0.5  --   PROT 7.1  --   ALBUMIN 3.7 3.0*   No results found for this basename: LIPASE, AMYLASE,  in the last 168 hours No results found for this basename: AMMONIA,  in the last 168 hours CBC:  Recent Labs Lab 04/19/13 1104 04/24/13 1700 04/25/13 0545  WBC 3.6* 7.7 5.4  HGB 15.4 12.8* 12.6*  HCT 45.2 37.3* 37.6*  MCV 89.9 89.7 89.7  PLT 173 147* 139*   Cardiac Enzymes: No results found for this basename: CKTOTAL, CKMB, CKMBINDEX, TROPONINI,  in the last 168 hours CBG: No results found for this basename: GLUCAP,  in the last 168 hours  Iron Studies: No results found for this basename: IRON, TIBC, TRANSFERRIN, FERRITIN,  in the last 72 hours Studies/Results: Dg Ang/ext/uni/or Left  04/24/2013   CLINICAL DATA:  Left femoral to popliteal artery bypass graft placement.  EXAM:  LEFT ANG/EXT/UNI/ OR  TECHNIQUE: See operative report  CONTRAST:  See operative documentation  COMPARISON:  None  FLUOROSCOPY TIME:  See operative report  FINDINGS: Intraoperative images show peroneal artery runoff in the calf with the distal peroneal artery supplying branches to the dorsalis pedis and distal posterior tibial artery supply.  IMPRESSION: Peroneal arterial runoff demonstrated below the knee.   Electronically Signed   By: Aletta Edouard M.D.   On: 04/24/2013 15:11   Medications: Infusions: . sodium chloride 125 mL/hr at 04/25/13 0102  . DOPamine      Scheduled Medications: . aspirin  325 mg Oral BID  . atorvastatin  10 mg Oral q1800  . cyanocobalamin  500 mcg Oral Daily  . docusate sodium  100 mg Oral Daily  . enoxaparin (LOVENOX) injection  40 mg Subcutaneous Q24H  . finasteride  5 mg Oral QAC breakfast  . magnesium oxide  400 mg Oral BID  . mycophenolate  500 mg Oral BID  .  NIFEdipine  30 mg Oral BID  . pantoprazole  40 mg Oral Daily  . tacrolimus  3 mg Oral BID  . tamsulosin  0.4 mg Oral Daily  . vitamin C  500 mg Oral Daily    have reviewed scheduled and prn medications.  Physical Exam: General: Alert, talkative no acute distress Heart: Regular rate and rhythm Lungs: Clear to auscultation bilaterally Abdomen: Soft, nontender, nondistended Extremities: Postoperative changes with limited mobility    04/25/2013,9:06 AM  LOS: 1 day

## 2013-04-25 NOTE — Progress Notes (Signed)
Patient complains of itching due to pain medicine. MD notified. Benadryl was ordered. Followed up with order. Will continue to monitor.   Domingo Dimes RN

## 2013-04-25 NOTE — Evaluation (Signed)
Physical Therapy Evaluation Patient Details Name: Scott Chavez MRN: [id number] DOB: May 09, 1955 Today's Date: 04/25/2013 Time: [id number] PT Time Calculation (min): 34 min  PT Assessment / Plan / Recommendation History of Present Illness  Pt s/o L iliofemoral endarterectomy and femoral popliteal bypass.  Clinical Impression  Pt slightly anxious however tolerated ambulation well despite increased L LE pain. Suspect pt to progress well enough to d/c home with significant other. Pt maybe be able to transition to crutches. Acute PT to con't to see to further progress mobility for safe d/c home.    PT Assessment  Patient needs continued PT services    Follow Up Recommendations  No PT follow up;Supervision/Assistance - 24 hour    Does the patient have the potential to tolerate intense rehabilitation      Barriers to Discharge        Equipment Recommendations  Rolling walker with 5" wheels (may transition to crutches on Friday)    Recommendations for Other Services     Frequency Min 3X/week    Precautions / Restrictions Precautions Precautions: Fall Restrictions Weight Bearing Restrictions: No (pt self L LE WBAT)   Pertinent Vitals/Pain 5/10 L LE      Mobility  Bed Mobility Bed Mobility: Not assessed (pt up in chair upon arrival) Transfers Transfers: Sit to Stand;Stand to Sit Sit to Stand: 4: Min assist;With upper extremity assist;From chair/3-in-1 Stand to Sit: 4: Min guard;With upper extremity assist;To chair/3-in-1 Details for Transfer Assistance: v/c's for safe hand placement and L LE management Ambulation/Gait Ambulation/Gait Assistance: 4: Min assist Ambulation Distance (Feet): 50 Feet Assistive device: Rolling walker Ambulation/Gait Assistance Details: max directional v/c's for sequencing. initially pt NWB on L LE however transitioned to <50% WBing Gait Pattern: Step-to pattern;Decreased stride length;Decreased stance time - left Gait velocity: slow Stairs: No     Exercises General Exercises - Lower Extremity Ankle Circles/Pumps: AROM;20 reps;Both   PT Diagnosis: Difficulty walking;Acute pain  PT Problem List: Decreased strength;Decreased activity tolerance;Decreased mobility PT Treatment Interventions: DME instruction;Gait training;Stair training;Functional mobility training;Therapeutic activities;Therapeutic exercise;Balance training     PT Goals(Current goals can be found in the care plan section) Acute Rehab PT Goals Patient Stated Goal: stop the pain PT Goal Formulation: With patient Time For Goal Achievement: 05/02/13 Potential to Achieve Goals: Good  Visit Information  Last PT Received On: 04/25/13 Assistance Needed: +1 History of Present Illness: Pt s/o L iliofemoral endarterectomy and femoral popliteal bypass.       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 Dominant Hand: Right    Cognition  Cognition Arousal/Alertness: Awake/alert Behavior During Therapy: WFL for tasks assessed/performed;Anxious Overall Cognitive Status: Within Functional Limits for tasks assessed    Extremity/Trunk Assessment Upper Extremity Assessment Upper Extremity Assessment: Overall WFL for tasks assessed Lower Extremity Assessment Lower Extremity Assessment: LLE deficits/detail LLE Deficits / Details: no MMT due to surgery, pt with good ankle ROM, limited hip/knee ROM due to pain Cervical / Trunk Assessment Cervical / Trunk Assessment: Normal   Balance    End of Session PT - End of Session Equipment Utilized  During Treatment: Gait belt Activity Tolerance: Patient limited by pain Patient left: in chair;with call bell/phone within  reach;with family/visitor present Nurse Communication: Mobility status  GP     Kingsley Callander 04/25/2013, 8:26 AM  Kittie Plater, PT, DPT Pager #: [phone] Office #: [phone]

## 2013-04-25 NOTE — Evaluation (Signed)
Occupational Therapy Evaluation Patient Details Name: Scott Chavez MRN: [id number] DOB: 11/04/1955 Today's Date: 04/25/2013 Time: [id number] OT Time Calculation (min): 30 min  OT Assessment / Plan / Recommendation History of present illness Pt s/o L iliofemoral endarterectomy and femoral popliteal bypass.   Clinical Impression   Pt presents with limitations in mobility and therefore ADL due to post surgical LE pain primarily.  Pt unable to tolerate much weight on L LE.  Somewhat anxious with mobility.  Will follow to practice tub and toilet transfers and determine if pt will need 3 in 1 or tub seat.  Pt will rely on significant other for LB ADL until pain is resolved he is able to perform independently.    OT Assessment  Patient needs continued OT Services    Follow Up Recommendations  No OT follow up    Barriers to Discharge      Equipment Recommendations   (TBD)    Recommendations for Other Services    Frequency  Min 2X/week    Precautions / Restrictions Precautions Precautions: Fall Restrictions Weight Bearing Restrictions: No   Pertinent Vitals/Pain L LE, did not rate, repositioned, premedicated    ADL  Eating/Feeding: Independent Where Assessed - Eating/Feeding: Bed level Grooming: Min guard;Wash/dry hands Where Assessed - Grooming: Unsupported standing Upper Body Bathing: Set up Where Assessed - Upper Body Bathing: Unsupported sitting Lower Body Bathing: Minimal assistance Where Assessed - Lower Body Bathing: Unsupported sitting;Supported sit to stand Upper Body Dressing: Set up Where Assessed - Upper Body Dressing: Unsupported sitting Lower Body Dressing: Minimal assistance Where Assessed - Lower Body Dressing: Unsupported sitting;Supported sit to stand Toilet Transfer: Minimal assistance Toilet Transfer Method: Sit to stand Toilet Transfer Equipment: Regular height toilet Equipment Used: Gait belt;Rolling walker Transfers/Ambulation Related to ADLs: pt  with difficulty rising from low surfaces due to pain, ambulated with RW and min guard assist in room. ADL Comments: Pt would like to avoid tub seat or 3 in 1, will continue to evaluation for equipment needs.  Wife can assist with LB ADL as needed.    OT Diagnosis: Acute pain;Generalized weakness  OT Problem List: Decreased activity tolerance;Impaired balance (sitting and/or standing);Decreased knowledge of use of DME or AE;Pain OT Treatment Interventions: Self-care/ADL training;DME and/or AE instruction   OT Goals(Current goals can be found in the care plan section) Acute Rehab OT Goals Patient Stated Goal: return home with wife OT Goal Formulation: With patient Time For Goal Achievement: 05/02/13 Potential to Achieve Goals: Good ADL Goals Pt Will Transfer to Toilet: with supervision;ambulating;regular height toilet Pt Will Perform Toileting - Clothing Manipulation and hygiene: with supervision;sit to/from stand Pt Will Perform Tub/Shower Transfer: with supervision;ambulating (determine need for tub seat)  Visit Information  Last OT Received On: 04/25/13 Assistance Needed: +1 History of Present Illness: Pt s/o L iliofemoral endarterectomy and femoral popliteal bypass.       Prior [place] expects to be discharged to:: Private residence Living Arrangements: Spouse/significant other Available Help at Discharge: Family;Available 24 hours/day Type of Home: House Home Access: Stairs to enter [employer] of Steps: 3 Entrance Stairs-Rails: None Home Layout: One level Home Equipment: None Prior Function Level of Independence: Independent Communication Communication: No difficulties Dominant Hand: Right         Vision/Perception Vision - History Baseline Vision: Wears glasses only for reading Patient Visual Report: No change from baseline   Cognition  Cognition Arousal/Alertness: Awake/alert Behavior During Therapy: WFL for  tasks assessed/performed;Anxious Overall  Cognitive Status: Within Functional Limits for tasks assessed    Extremity/Trunk Assessment Upper Extremity Assessment Upper Extremity Assessment: Overall WFL for tasks assessed Lower Extremity Assessment Lower Extremity Assessment: Defer to PT evaluation Cervical / Trunk Assessment Cervical / Trunk Assessment: Normal     Mobility Bed Mobility Bed Mobility: Sit to Supine Sit to Supine: 4: Min assist Transfers Transfers: Sit to Stand;Stand to Sit Sit to Stand: 4: Min assist;With upper extremity assist;From chair/3-in-1 Stand to Sit: 4: Min guard;With upper extremity assist;To chair/3-in-1 Details for Transfer Assistance: v/c's for safe hand placement and L LE management     Exercise     Balance     End of Session OT - End of Session Equipment Utilized During Treatment: Rolling walker Activity Tolerance: Patient limited by pain Patient left: in bed;with call bell/phone within reach;with family/visitor present  GO     Malka So 04/25/2013, 3:15 PM [phone]

## 2013-04-25 NOTE — Progress Notes (Signed)
Patient has not spontaneously voided since foley was removed. Patient feeling uncomfortable. Bladder scan showed 858 mL. MD notified. Per protocol straight cath was performed removing 650 mL of urine. Will continue to monitor.   Domingo Dimes RN

## 2013-04-26 NOTE — Progress Notes (Signed)
Pt ambulated hall with wife using RW.  On return lower incision noted to be draining a small amt of serous fluid.  Dry guaze dressing applied.  Will con't plan of care.

## 2013-04-26 NOTE — Progress Notes (Addendum)
[facility] Progress Note  04/26/2013 7:04 AM 2 Days Post-Op  Subjective:  Sleeping soundly-awakes easily  Tm 99.3 VSS 97% RA  Filed Vitals:   04/26/13 0502  BP: 132/78  Pulse: 98  Temp: 99.3 F (37.4 C)  Resp: 18    Physical Exam: Incisions:  Bandages removed-above and below knee incisions are c/d/i.  Saphenous vein harvest sites are c/d/i with staples in tact. Extremities:  Left foot is warm and well perfused.  + doppler signal left PT/DP  CBC    Component Value Date/Time   WBC 5.4 04/25/2013 0545   RBC 4.19* 04/25/2013 0545   HGB 12.6* 04/25/2013 0545   HCT 37.6* 04/25/2013 0545   PLT 139* 04/25/2013 0545   MCV 89.7 04/25/2013 0545   MCH 30.1 04/25/2013 0545   MCHC 33.5 04/25/2013 0545   RDW 13.6 04/25/2013 0545   LYMPHSABS 1.0 03/17/2007 1110   MONOABS 0.4 03/17/2007 1110   EOSABS 0.2 03/17/2007 1110   BASOSABS 0.0 03/17/2007 1110    BMET    Component Value Date/Time   NA 140 04/25/2013 0545   K 3.9 04/25/2013 0545   CL 108 04/25/2013 0545   CO2 23 04/25/2013 0545   GLUCOSE 108* 04/25/2013 0545   BUN 8 04/25/2013 0545   CREATININE 1.08 04/25/2013 0545   CALCIUM 8.8 04/25/2013 0545   GFRNONAA 74* 04/25/2013 0545   GFRAA 86* 04/25/2013 0545    INR    Component Value Date/Time   INR 0.92 04/19/2013 1104     Intake/Output Summary (Last 24 hours) at 04/26/13 0704 Last data filed at 04/26/13 0500  Gross per 24 hour  Intake      0 ml  Output    775 ml  Net   -775 ml     Assessment:  57 y.o. male is s/p:  1. Left iliofemoral endarterectomy with bovine patch angioplasty  2. Left common femoral artery to below-the-knee popliteal artery bypass with non-reverse ipsilateral greater saphenous vein  3. Left common femoral artery open cannulation  4. Left leg runoff   2 Days Post-Op  Plan: -pt doing well this am-bypass graft is patent -ambulated in halls with a walker yesterday.  Increase mobilization today -will plan for  discharge tomorrow. -pt did need and I&O cath last pm.  He is also on Flomax for BPH.  He states that he does straight caths at home and would like to do them himself here. -DVT prophylaxis:  Lovenox -awaiting post op ABI's   Leontine Locket, PA-C [facility] [phone] 04/26/2013 7:04 AM   Addendum  I have independently interviewed and examined the patient, and I agree with the physician assistant's findings.  Groin and calf inc c/d/i, some fluid accumulation in vein harvest sites.  No signs of cellulitis.  [name], MD [facility] [place] Office: [phone] Pager: [phone]  04/26/2013, 8:21 AM

## 2013-04-26 NOTE — Progress Notes (Signed)
Patient bladder scanned with 711 mL. Patient states not uncomfortable and refused straight cath at this time. Will continue to monitor.   Domingo Dimes RN

## 2013-04-27 ENCOUNTER — Telehealth: Payer: Self-pay | Admitting: Vascular Surgery

## 2013-04-27 DIAGNOSIS — Z48812 Encounter for surgical aftercare following surgery on the circulatory system: Secondary | ICD-10-CM

## 2013-04-27 LAB — RENAL FUNCTION PANEL
Albumin: 2.9 g/dL — ABNORMAL LOW (ref 3.5–5.2)
BUN: 6 mg/dL (ref 6–23)
CO2: 26 mEq/L (ref 19–32)
Chloride: 104 mEq/L (ref 96–112)
Creatinine, Ser: 0.91 mg/dL (ref 0.50–1.35)
GFR calc Af Amer: >90 mL/min (ref >90)
GFR calc non Af Amer: >90 mL/min (ref >90)
Sodium: 139 mEq/L (ref 135–145)

## 2013-04-27 MED ORDER — OXYCODONE-ACETAMINOPHEN 5-325 MG PO TABS: 1.0000 | ORAL_TABLET | Freq: Four times a day (QID) | ORAL | Status: DC | PRN

## 2013-04-27 MED ORDER — CEPHALEXIN 500 MG PO CAPS: 500.0000 mg | ORAL_CAPSULE | Freq: Four times a day (QID) | ORAL | Status: DC

## 2013-04-27 NOTE — Progress Notes (Addendum)
VASCULAR LAB PRELIMINARY  ARTERIAL  ABI completed:    RIGHT    LEFT    PRESSURE WAVEFORM  PRESSURE WAVEFORM  BRACHIAL 150 Triphasic BRACHIAL Restricted arm Triphasic  DP 109 Monophasic DP 101 Monophasic  PT 85 Monophasic PT 130 Monophasic    RIGHT LEFT  ABI 0.73 0.87   ABI on the right indicates a moderate reduction in arterial flow. Left ABI indicates a mild reduction.  Shakeyla Giebler, RVS 04/27/2013, 8:44 AM

## 2013-04-27 NOTE — Telephone Encounter (Addendum)
Message copied by Gena Fray on Fri Apr 27, 2013  9:34 AM ------      Message from: Peter Minium K      Created: Wed Apr 25, 2013 10:34 AM      Regarding: schedule                   ----- Message -----         From: Gabriel Earing, PA-C         Sent: 04/25/2013  10:23 AM           To: Mena Goes, CMA, [facility]            S/p       1.  Left iliofemoral endarterectomy with bovine patch angioplasty      2.  Left common femoral artery to below-the-knee popliteal artery bypass with non-reverse ipsilateral greater saphenous vein        3.  Left common femoral artery open cannulation      4.  Left leg runoff            On 04/24/13.  F/u in 2 weeks with Dr. Bridgett Larsson.            Thanks,      Aldona Bar ------  04/27/13: Scott Chavez

## 2013-04-27 NOTE — Progress Notes (Signed)
   Daily Progress Note  Assessment/Planning: POD #3 s/p L CFA to BK pop bypass with NR ips GSV   Wound care instructions given  Poor wound healing was expected in this immunosuppressed patient so the drainage from distal incisions not unexpected with increased mobility  Will d/c on Keflex as precaution in this pt with immunosuppression  F/U in office in 2 weeks for staple removal  Subjective  - 3 Days Post-Op  No complaints, pain better controlled, improved mobility  Objective Filed Vitals:   04/26/13 0502 04/26/13 1414 04/26/13 2008 04/27/13 0524  BP: 132/78 138/65 128/92 114/62  Pulse: 98 90 100 94  Temp: 99.3 F (37.4 C) 99.3 F (37.4 C) 99.3 F (37.4 C) 99 F (37.2 C)  TempSrc: Oral Oral Oral Oral  Resp: 18 17 20 16   Height:      Weight:      SpO2: 97% 96% 98% 93%    Intake/Output Summary (Last 24 hours) at 04/27/13 0732 Last data filed at 04/26/13 1950  Gross per 24 hour  Intake   1320 ml  Output    970 ml  Net    350 ml    PULM  CTAB CV  RRR GI  soft, NTND VASC  L groin inc c/d/i, stapled incision with serosang drainage distally, calf incision c/d/i, some drainage on bandage  Laboratory CBC    Component Value Date/Time   WBC 5.4 04/25/2013 0545   HGB 12.6* 04/25/2013 0545   HCT 37.6* 04/25/2013 0545   PLT 139* 04/25/2013 0545    BMET    Component Value Date/Time   NA 139 04/27/2013 0400   K 3.6 04/27/2013 0400   CL 104 04/27/2013 0400   CO2 26 04/27/2013 0400   GLUCOSE 125* 04/27/2013 0400   BUN 6 04/27/2013 0400   CREATININE 0.91 04/27/2013 0400   CALCIUM 9.1 04/27/2013 0400   GFRNONAA >90 04/27/2013 0400   GFRAA >90 04/27/2013 0400    [name], MD [facility] [place]: [phone] Pager: [phone]  04/27/2013, 7:32 AM

## 2013-04-27 NOTE — Progress Notes (Signed)
Pt ambulated with RN approximately 150 ft with front wheel walker.  Pt tolerated well.  Pt back to bed and leg assessed.  Call bell within reach.  Will continue to monitor pt closely.  Scott Chavez A

## 2013-04-27 NOTE — Care Management Note (Signed)
    Page 1 of 1   04/27/2013     4:32:28 PM   CARE MANAGEMENT NOTE 04/27/2013  Patient:  PAOLA, BURKINS   Account Number:  [id number]  Date Initiated:  04/24/2013  Documentation initiated by:  Marvetta Gibbons  Subjective/Objective Assessment:   Pt admitted s/p fempop bypass graft     Action/Plan:   PTA pt lived at home with spouse- NCM to follow post po progress for d/c needs   Anticipated DC Date:  04/27/2013   Anticipated DC Plan:  [place]  CM consult      Choice offered to / List presented to:     DME arranged  [facility]      DME agency  [facility].        Status of service:  Completed, signed off Medicare Important Message given?   (If response is "NO", the following Medicare IM given date fields will be blank) Date Medicare IM given:   Date Additional Medicare IM given:    Discharge Disposition:  HOME/SELF CARE  Per UR Regulation:  Reviewed for med. necessity/level of care/duration of stay  If discussed at [place] of Stay Meetings, dates discussed:    Comments:  04/27/13 Antoine Primas [id number] [facility] REQUESTED FOR HOME.  REFERRAL TO [facility] FOR DME NEEDS.

## 2013-04-27 NOTE — Progress Notes (Deleted)
Pt discharging home with wife.  All instructions and prescriptions given and reviewed, all questions answered. 

## 2013-04-27 NOTE — Progress Notes (Signed)
[facility] Progress Note  04/27/2013 7:29 AM 3 Days Post-Op  Subjective:  Still sore, but does well with the pain medication  Tm 99.3 now 99 VSS  Filed Vitals:   04/27/13 0524  BP: 114/62  Pulse: 94  Temp: 99 F (37.2 C)  Resp: 16    Physical Exam: Incisions:  Below knee incision has some serous drainage; left groin incision is c/d/i without hematoma.  Other incisions are c/d/i with staples in tact.  There is a fluid collection between saphenous vein harvest site above the knee. Extremities:  Left foot is warm and well perfused.  CBC    Component Value Date/Time   WBC 5.4 04/25/2013 0545   RBC 4.19* 04/25/2013 0545   HGB 12.6* 04/25/2013 0545   HCT 37.6* 04/25/2013 0545   PLT 139* 04/25/2013 0545   MCV 89.7 04/25/2013 0545   MCH 30.1 04/25/2013 0545   MCHC 33.5 04/25/2013 0545   RDW 13.6 04/25/2013 0545   LYMPHSABS 1.0 03/17/2007 1110   MONOABS 0.4 03/17/2007 1110   EOSABS 0.2 03/17/2007 1110   BASOSABS 0.0 03/17/2007 1110    BMET    Component Value Date/Time   NA 139 04/27/2013 0400   K 3.6 04/27/2013 0400   CL 104 04/27/2013 0400   CO2 26 04/27/2013 0400   GLUCOSE 125* 04/27/2013 0400   BUN 6 04/27/2013 0400   CREATININE 0.91 04/27/2013 0400   CALCIUM 9.1 04/27/2013 0400   GFRNONAA >90 04/27/2013 0400   GFRAA >90 04/27/2013 0400    INR    Component Value Date/Time   INR 0.92 04/19/2013 1104     Intake/Output Summary (Last 24 hours) at 04/27/13 0729 Last data filed at 04/26/13 1950  Gross per 24 hour  Intake   1320 ml  Output    970 ml  Net    350 ml     Assessment:  56 y.o. male is s/p:  1. Left iliofemoral endarterectomy with bovine patch angioplasty  2. Left common femoral artery to below-the-knee popliteal artery bypass with non-reverse ipsilateral greater saphenous vein  3. Left common femoral artery open cannulation  4. Left leg runoff   3 Days Post-Op  Plan: -pt bypass graft is patent -he does have some  drainage from his below knee incision and fluid accumulation at the saphenous vein harvest tunnel.  Given that he is immunosuppressed, we will start him on prophylactic ABx. -DVT prophylaxis:  Lovenox -will discharge home today-if pt has any issues, he will call and we will see him promptly given his immunosuppression for kidney transplant. -f/u with Dr. Bridgett Larsson on 05/04/13 -spoke with Pharmacy and Keflex will be fine as far as interaction with immunosuppression medications.   Leontine Locket, PA-C [facility] [phone] 04/27/2013 7:29 AM

## 2013-04-27 NOTE — Progress Notes (Signed)
Pt discharged home with wife and rolling walker.  Of note was able to void twice this morning, for first time without cath according to Pt.  All instructions and prescriptions given and reviewed, all questions answered.

## 2013-04-27 NOTE — Discharge Summary (Signed)
[facility] Discharge Summary  JOHANDRY LONGDEN July 06, 1955 57 y.o. male  [id number]  Admission Date: 04/24/2013  Discharge Date: 04/27/13  Physician: Conrad , MD  Admission Diagnosis: PVD   HPI:   This is a 57 y.o. male who presents with chief complaint: L>R IC. The patient has no rest pain and wounds include: none. The patient notes symptoms have not progressed. The patient's treatment regimen currently included: maximal medical management. His nuclear stress test is scheduled for Monday.  Hospital Course:  The patient was admitted to the hospital and taken to the operating room on 04/24/2013 and underwent: 1. Left iliofemoral endarterectomy with bovine patch angioplasty  2. Left common femoral artery to below-the-knee popliteal artery bypass with non-reverse ipsilateral greater saphenous vein  3. Left common femoral artery open cannulation  4. Left leg runoff    The pt tolerated the procedure well and was transported to the PACU in good condition. By POD 1, he was doing well with doppler signals in foot.  He was transferred to the telemetry floor. He did need I&O cath, which he does this at home.  By POD 3, he did have some drainage of his below knee incision and some fluid collection at saphenous vein harvest site.  He was started on prophylactic ABx given his immunosuppression.  Spoke with pharmacy and this is okay to give with his immunosuppression medication.    ABI's postoperatively on 04/27/13 are as follows:  RIGHT    LEFT     PRESSURE  WAVEFORM   PRESSURE  WAVEFORM   BRACHIAL  150  Triphasic  BRACHIAL  Restricted arm  Triphasic   DP  120  Monophasic  DP  101  Monophasic   PT  85  Monophasic  PT  130  Monophasic     RIGHT  LEFT   ABI  0.73  0.87   ABI on the right indicates a moderate reduction in arterial flow. Left ABI indicates a mild reduction.  The remainder of the hospital course consisted of increasing mobilization and increasing intake  of solids without difficulty.  CBC    Component Value Date/Time   WBC 5.4 04/25/2013 0545   RBC 4.19* 04/25/2013 0545   HGB 12.6* 04/25/2013 0545   HCT 37.6* 04/25/2013 0545   PLT 139* 04/25/2013 0545   MCV 89.7 04/25/2013 0545   MCH 30.1 04/25/2013 0545   MCHC 33.5 04/25/2013 0545   RDW 13.6 04/25/2013 0545   LYMPHSABS 1.0 03/17/2007 1110   MONOABS 0.4 03/17/2007 1110   EOSABS 0.2 03/17/2007 1110   BASOSABS 0.0 03/17/2007 1110    BMET    Component Value Date/Time   NA 139 04/27/2013 0400   K 3.6 04/27/2013 0400   CL 104 04/27/2013 0400   CO2 26 04/27/2013 0400   GLUCOSE 125* 04/27/2013 0400   BUN 6 04/27/2013 0400   CREATININE 0.91 04/27/2013 0400   CALCIUM 9.1 04/27/2013 0400   GFRNONAA >90 04/27/2013 0400   GFRAA >90 04/27/2013 0400     Discharge Instructions:   The patient is discharged to home with extensive instructions on wound care and progressive ambulation.  They are instructed not to drive or perform any heavy lifting until returning to see the physician in his office.  Discharge Orders   Future Orders Complete By Expires   Call MD for:  redness, tenderness, or signs of infection (pain, swelling, bleeding, redness, odor or green/yellow discharge around incision site)  As directed  Call MD for:  severe or increased pain, loss or decreased feeling  in affected limb(s)  As directed    Call MD for:  temperature >100.5  As directed    Discharge wound care:  As directed    Comments:     Wash the groin wound with soap and water daily and pat dry. (No tub bath-only shower)  Then put a dry gauze or washcloth there to keep this area dry daily and as needed.  Do not use Vaseline or neosporin on your incisions.  Only use soap and water on your incisions and then protect and keep dry.   Driving Restrictions  As directed    Comments:     No driving for 2 weeks   Lifting restrictions  As directed    Comments:     No lifting for 4 weeks   Resume previous diet  As  directed       Discharge Diagnosis:  PVD  Secondary Diagnosis: Patient Active Problem List   Diagnosis Date Noted  . PAD (peripheral artery disease) 04/24/2013  . Preop cardiovascular exam 03/27/2013  . Essential hypertension 03/27/2013  . Hyperlipidemia 03/27/2013  . Atherosclerosis of native arteries of the extremities with intermittent claudication 02/16/2013  . Transplant recipient 02/16/2013   Past Medical History  Diagnosis Date  . History of renal failure   . Hemodialysis patient     "on dialysis 1998-2010" 04/25/2013)  . Hypertension   . Hypercholesteremia   . Bladder dysfunction   . Peripheral vascular disease   . Aorto-iliac disease   . ESRD (end stage renal disease)     [facility] , followed by Dr. Moshe Cipro   . GERD (gastroesophageal reflux disease)   . Anemia     pt. not sure, but reports he is taking Fe for one month now.  . Self-catheterizes urinary bladder     pt. choice, but he reports that he desires to do this to avoid retention   . High cholesterol        Medication List         aspirin 325 MG tablet  Take 325 mg by mouth 2 (two) times daily.     b complex vitamins capsule  Take 1 capsule by mouth daily.     cephALEXin 500 MG capsule  Commonly known as:  KEFLEX  Take 1 capsule (500 mg total) by mouth 4 (four) times daily.     finasteride 5 MG tablet  Commonly known as:  PROSCAR  Take 5 mg by mouth daily before breakfast.     magnesium oxide 400 MG tablet  Commonly known as:  MAG-OX  Take 400 mg by mouth 2 (two) times daily.     mycophenolate 250 MG capsule  Commonly known as:  CELLCEPT  Take 500 mg by mouth 2 (two) times daily.     NIFEdipine 30 MG 24 hr tablet  Commonly known as:  PROCARDIA-XL/ADALAT-CC/NIFEDICAL-XL  Take 30 mg by mouth 2 (two) times daily.     omeprazole 20 MG capsule  Commonly known as:  PRILOSEC  Take 20 mg by mouth 2 (two) times daily before a meal.     rosuvastatin 5 MG  tablet  Commonly known as:  CRESTOR  Take 5 mg by mouth daily before breakfast.     tacrolimus 1 MG capsule  Commonly known as:  PROGRAF  Take 3 mg by mouth 2 (two) times daily.     tadalafil 20 MG tablet  Commonly known as:  CIALIS  Take 20 mg by mouth daily as needed for erectile dysfunction.     tamsulosin 0.4 MG Caps capsule  Commonly known as:  FLOMAX  Take 0.4 mg by mouth daily.     vitamin B-12 500 MCG tablet  Commonly known as:  CYANOCOBALAMIN  Take 500 mcg by mouth daily.     vitamin C 500 MG tablet  Commonly known as:  ASCORBIC ACID  Take 500 mg by mouth daily.     zolpidem 10 MG tablet  Commonly known as:  AMBIEN  Take 10 mg by mouth at bedtime as needed for sleep.        Roxicodone #30 No Refill  Disposition: home  Patient's condition: is Good  Follow up: 1. Dr. Bridgett Larsson in 2 weeks   Leontine Locket, PA-C [facility] [phone] 04/27/2013  7:39 AM  Addendum  I have independently interviewed and examined the patient, and I agree with the physician assistant's discharge summary. This patient s/p CKT presents with B lifestyle limiting intermittent claudication (L>R).  He underwent a L CFA to BK pop bypass with NR ips GSV.  The common femoral artery was heavily calcified and also required iliofemoral endarterectomy with bovine patch angioplasty to facility the bypass.  The below-the-knee popliteal artery had ~30% stenosis already in the artery.  The pt's recovery has been appropriate with some delayed wound healing as expected given his immunosuppressed status. His LLE ABI improved as expected.  As a precaution, he is being discharge on PO Keflex to prevent wound related cellulitis.  He will follow up in the office in two weeks for staple removal.  [name], MD [facility] [place]: [phone] Pager: [phone]  04/27/2013, 12:41 PM    - For VQI Registry use --- Instructions: Press F2 to tab  through selections.  Delete question if not applicable.   Post-op:  Wound infection: No  Graft infection: No  Transfusion: No  If yes, n/a units given New Arrhythmia: No Ipsilateral amputation: No, [ ]  Minor, [ ]  BKA, [ ]  AKA Discharge patency: [ x] Primary, [ ]  Primary assisted, [ ]  Secondary, [ ]  Occluded Patency judged by: [ ]  Dopper only, [ ]  Palpable graft pulse, [ ]  Palpable distal pulse, [x]  ABI inc. > 0.15, [ ]  Duplex Discharge ABI: R 0.73, L 0.87 Discharge TBI: R , L  D/C Ambulatory Status: Ambulatory with Assistance  Complications: MI: No, [ ]  Troponin only, [ ]  EKG or Clinical CHF: No Resp failure:No, [ ]  Pneumonia, [ ]  Ventilator Chg in renal function: No, [ ]  Inc. Cr > 0.5, [ ]  Temp. Dialysis, [ ]  Permanent dialysis Stroke: No, [ ]  Minor, [ ]  Major Return to OR: No  Reason for return to OR: [ ]  Bleeding, [ ]  Infection, [ ]  Thrombosis, [ ]  Revision  Discharge medications: Statin use:  Yes ASA use:  Yes Plavix use:  No  for medical reason not indicated Beta blocker use: No  for medical reason not indicated Coumadin use: No  for medical reason not indicated

## 2013-04-27 NOTE — Progress Notes (Signed)
Physical Therapy Treatment Patient Details Name: Scott Chavez MRN: [id number] DOB: August 14, 1955 Today's Date: 04/27/2013 Time: 1240-1250 PT Time Calculation (min): 10 min  PT Assessment / Plan / Recommendation  History of Present Illness Pt s/o L iliofemoral endarterectomy and femoral popliteal bypass.   PT Comments   Pt with much improved mobility and ready for dc home from PT standpoint.  Follow Up Recommendations  No PT follow up;Supervision - Intermittent     Does the patient have the potential to tolerate intense rehabilitation     Barriers to Discharge        Equipment Recommendations  Rolling walker with 5" wheels    Recommendations for Other Services    Frequency     Progress towards PT Goals Progress towards PT goals: Progressing toward goals  Plan Current plan remains appropriate    Precautions / Restrictions Precautions Precautions: None   Pertinent Vitals/Pain No c/o's    Mobility  Transfers Sit to Stand: 6: Modified independent (Device/Increase time);With upper extremity assist;With armrests;From chair/3-in-1 Stand to Sit: 6: Modified independent (Device/Increase time);With upper extremity assist;With armrests;To chair/3-in-1 Details for Transfer Assistance: verbal cues for hand placment. Ambulation/Gait Ambulation/Gait Assistance: 6: Modified independent (Device/Increase time) Ambulation Distance (Feet): 20 Feet Assistive device: Rolling walker Ambulation/Gait Assistance Details: No cues needed Gait Pattern: Step-through pattern;Decreased stride length Gait velocity: decr Stairs: Yes Stairs Assistance: 4: Min assist Stairs Assistance Details (indicate cue type and reason): Hand-held assist on lt. and verbal cues for sequencing Stair Management Technique: Step to pattern;Forwards Number of Stairs: 1    Exercises     PT Diagnosis:    PT Problem List:   PT Treatment Interventions:     PT Goals (current goals can now be found in the care plan  section)    Visit Information  Last PT Received On: 04/27/13 Assistance Needed: +1 History of Present Illness: Pt s/o L iliofemoral endarterectomy and femoral popliteal bypass.    Subjective Data      Cognition  Cognition Arousal/Alertness: Awake/alert Behavior During Therapy: WFL for tasks assessed/performed;Anxious Overall Cognitive Status: Within Functional Limits for tasks assessed    Balance     End of Session PT - End of Session Activity Tolerance: Patient tolerated treatment well Patient left: in chair;with family/visitor present Nurse Communication: Mobility status   GP     Debhora Titus 04/27/2013, 1:36 PM  [facility] PT [phone]

## 2013-04-27 NOTE — Telephone Encounter (Addendum)
Message copied by Gena Fray on Fri Apr 27, 2013  9:33 AM ------      Message from: Peter Minium K      Created: Wed Apr 25, 2013 10:34 AM      Regarding: schedule                   ----- Message -----         From: Gabriel Earing, PA-C         Sent: 04/25/2013  10:23 AM           To: Mena Goes, CMA, [facility]            S/p       1.  Left iliofemoral endarterectomy with bovine patch angioplasty      2.  Left common femoral artery to below-the-knee popliteal artery bypass with non-reverse ipsilateral greater saphenous vein        3.  Left common femoral artery open cannulation      4.  Left leg runoff            On 04/24/13.  F/u in 2 weeks with Dr. Bridgett Larsson.            Thanks,      Aldona Bar ------  04/27/13: lm on home # re appt- cell # vm not set up, dpm

## 2013-05-01 ENCOUNTER — Other Ambulatory Visit: Payer: Self-pay | Admitting: *Deleted

## 2013-05-01 ENCOUNTER — Telehealth: Payer: Self-pay | Admitting: *Deleted

## 2013-05-01 DIAGNOSIS — G8918 Other acute postprocedural pain: Secondary | ICD-10-CM

## 2013-05-01 MED ORDER — OXYCODONE-ACETAMINOPHEN 5-325 MG PO TABS: 1.0000 | ORAL_TABLET | Freq: Four times a day (QID) | ORAL | Status: DC | PRN

## 2013-05-01 NOTE — Telephone Encounter (Signed)
Patient called to request more pain meds (S/P Left iliofemoral endarterectomy and Left Fem-pop by Dr. Bridgett Larsson on 04-14-13 for claudication). He is ambulating more with his rolling walker. He reports no drainage or erythema from his incisions and he is afebrile. Patient reports no constipation or urinary problems( post renal transplant). Dr. Kellie Simmering signed rx for Percocet 5-325mg  #30 and the patient will come pick this up. I informed the patient that this Rx should last him until he is seen on 05-11-12 by Dr. Bridgett Larsson. He voiced understanding and agreement of this plan.

## 2013-05-02 ENCOUNTER — Encounter: Payer: Self-pay | Admitting: Vascular Surgery

## 2013-05-02 ENCOUNTER — Ambulatory Visit (INDEPENDENT_AMBULATORY_CARE_PROVIDER_SITE_OTHER): Payer: Self-pay | Admitting: Vascular Surgery

## 2013-05-02 ENCOUNTER — Encounter: Payer: Self-pay | Admitting: *Deleted

## 2013-05-02 VITALS — BP 126/102 | HR 72 | Resp 18 | Ht 69.0 in | Wt 159.0 lb

## 2013-05-02 DIAGNOSIS — G8918 Other acute postprocedural pain: Secondary | ICD-10-CM | POA: Insufficient documentation

## 2013-05-02 DIAGNOSIS — I70219 Atherosclerosis of native arteries of extremities with intermittent claudication, unspecified extremity: Secondary | ICD-10-CM

## 2013-05-02 NOTE — Progress Notes (Signed)
   Patient name: Scott Chavez MRN: [id number] DOB: 10/14/1955 Sex: male  REASON FOR VISIT: Slight separation of wound left leg  HPI: Scott Chavez is a 57 y.o. male who underwent a left iliofemoral endarterectomy and a left femoral to below knee pop bypass with vein by Dr. Bridgett Larsson on 04/24/2013. He developed some slight separation of the wound at the level of his below the knee incision. He comes in to have this checked. He denies fever or chills. There has been no drainage.  REVIEW OF SYSTEMS: Valu.Nieves ] denotes positive finding; [  ] denotes negative finding  CARDIOVASCULAR:  [ ]  chest pain   [ ]  dyspnea on exertion    CONSTITUTIONAL:  [ ]  fever   [ ]  chills  PHYSICAL EXAM: Filed Vitals:   05/02/13 1216  BP: 126/102  Pulse: 72  Resp: 18  Height: 5\' 9"  (1.753 m)  Weight: 159 lb (72.122 kg)   Body mass index is 23.47 kg/(m^2). GENERAL: The patient is a well-nourished male, in no acute distress. The vital signs are documented above. CARDIOVASCULAR: There is a regular rate and rhythm. PULMONARY: There is good air exchange bilaterally without wheezing or rales. He has brisk Doppler signals in the left foot in the dorsalis pedis and posterior tibial positions. The incisions all look fine except the incision below the knee has some slight separation. This has been present for several days and has not changed. I Steri-Stripped the majority of the wound and left a small open area so that he can apply bacitracin and a Band-Aid over this area.  MEDICAL ISSUES: The patient is doing well status post left lower extremity revascularization. He has minimal wound separation of his left below the knee incision which I think will heal without any difficulty. He will keep his regularly scheduled appointment with Dr. Bridgett Larsson.  [place] [facility] [place]: [phone]

## 2013-05-10 ENCOUNTER — Encounter: Payer: Self-pay | Admitting: Vascular Surgery

## 2013-05-11 ENCOUNTER — Encounter: Payer: Self-pay | Admitting: Vascular Surgery

## 2013-05-11 ENCOUNTER — Ambulatory Visit (INDEPENDENT_AMBULATORY_CARE_PROVIDER_SITE_OTHER): Payer: Self-pay | Admitting: Vascular Surgery

## 2013-05-11 VITALS — BP 124/82 | HR 72 | Resp 18 | Ht 67.0 in | Wt 159.0 lb

## 2013-05-11 DIAGNOSIS — I70219 Atherosclerosis of native arteries of extremities with intermittent claudication, unspecified extremity: Secondary | ICD-10-CM

## 2013-05-11 DIAGNOSIS — Z9489 Other transplanted organ and tissue status: Secondary | ICD-10-CM

## 2013-05-11 MED ORDER — OXYCODONE-ACETAMINOPHEN 5-325 MG PO TABS: 1.0000 | ORAL_TABLET | ORAL | Status: DC | PRN

## 2013-05-11 NOTE — Progress Notes (Signed)
    Postoperative Visit   History of Present Illness  Scott Chavez is a 58 y.o. year old male who presents for postoperative follow-up for:  PROCEDURE:  1. Left iliofemoral endarterectomy with bovine patch angioplasty  2. Left common femoral artery to below-the-knee popliteal artery bypass with non-reverse ipsilateral greater saphenous vein  3. Left common femoral artery open cannulation  4. Left leg runoff  (Date: 04/24/13).  The patient's wounds are healing.  The patient notes post-surgical pain in L leg.  The patient is able to complete their activities of daily living.  The patient's current symptoms are: serosang. drainage from L calf.  The patient finished his oral abx  For VQI Use Only  PRE-ADM LIVING: Home  AMB STATUS: Ambulatory  Physical Examination  Filed Vitals:   05/11/13 1609  BP: 124/82  Pulse: 72  Resp: 18   LLE: groin incision is healed, vein harvest incision healed with staples in place, L calf nearly healed with small area of superficial separation, no obvious drainage evident, L leg is swollen throughout, warm L foot  Medical Decision Making  Scott Chavez is a 58 y.o. year old male who presents s/p L iliofemoral EA w/ BPA, L fem-BK pop bypass with NR ips GSV.  The patient's bypass incisions are healing appropriately with resolution of pre-operative symptoms.  I have given the patient strict wound care instruction due his immunosuppressed status.  He is going to follow up in two weeks for further wound evaluation.    He is going to start a compression stocking on the L leg to help manage the s/p bypass related swelling.  Due to some persistence in pain in left lower leg, likely due to some degree of edema and possibly reperfusion syndrome, I am issuing him some narcotics, Percocet 5/325 mg 1-2 PO q4-6 hr prn pain, #50 no refills. I discussed in depth with the patient the nature of atherosclerosis, and emphasized the importance of maximal medical  management including strict control of blood pressure, blood glucose, and lipid levels, obtaining regular exercise, and cessation of smoking.  The patient is aware that without maximal medical management the underlying atherosclerotic disease process will progress, limiting the benefit of any interventions. The patient's surveillance will included ABI and bypass duplex studies which will be completed in: 3 months, at which time the patient will be re-evaluated.   I emphasized the importance of routine surveillance of the patient's bypass, as the vascular surgery literature emphasize the improved patency possible with assisted primary patency procedures versus secondary patency procedures. The patient agrees to participate in their maximal medical care and routine surveillance.  Thank you for allowing [place] to participate in this patient's care.  [name], MD [facility] [place] Office: [phone] Pager: [phone]  05/11/2013, 6:26 PM

## 2013-05-24 ENCOUNTER — Encounter: Payer: Self-pay | Admitting: Family

## 2013-05-24 ENCOUNTER — Telehealth: Payer: Self-pay

## 2013-05-24 DIAGNOSIS — M7989 Other specified soft tissue disorders: Secondary | ICD-10-CM

## 2013-05-24 DIAGNOSIS — M79609 Pain in unspecified limb: Secondary | ICD-10-CM

## 2013-05-24 NOTE — Telephone Encounter (Signed)
Phone call from pt.  C/o "major swelling" in the left leg and foot.  Reports increased tenderness in the inner aspect of the left calf, down towards the ankle.  States there is some improvement in the swelling overnight, but that his lower leg/foot is very swollen today.  States that his left foot is "somewhat red".  Reports the left leg "incisions look okay".  Denies any redness or drainage from the incisional areas.  Reports that his activity has recently increased and questions if the swelling and increased tenderness could be related to activity change.  Discussed with Dr. Oneida Alar.  Recommends to schedule a venous duplex to evaluate the swelling and office visit.  Will contact pt. to schedule appts. For 1/23.

## 2013-05-25 ENCOUNTER — Ambulatory Visit (HOSPITAL_COMMUNITY)
Admission: RE | Admit: 2013-05-25 | Discharge: 2013-05-25 | Disposition: A | Discharge status: Home / Self Care | Payer: PRIVATE HEALTH INSURANCE | Source: Ambulatory Visit | Attending: Family | Admitting: Family

## 2013-05-25 ENCOUNTER — Encounter: Payer: Self-pay | Admitting: Family

## 2013-05-25 ENCOUNTER — Ambulatory Visit (INDEPENDENT_AMBULATORY_CARE_PROVIDER_SITE_OTHER): Payer: Self-pay | Admitting: Family

## 2013-05-25 VITALS — BP 151/88 | HR 83 | Temp 97.8°F | Resp 16 | Ht 69.0 in | Wt 151.0 lb

## 2013-05-25 DIAGNOSIS — I70219 Atherosclerosis of native arteries of extremities with intermittent claudication, unspecified extremity: Secondary | ICD-10-CM

## 2013-05-25 DIAGNOSIS — M7989 Other specified soft tissue disorders: Secondary | ICD-10-CM

## 2013-05-25 DIAGNOSIS — M79609 Pain in unspecified limb: Secondary | ICD-10-CM | POA: Insufficient documentation

## 2013-05-25 DIAGNOSIS — L539 Erythematous condition, unspecified: Secondary | ICD-10-CM | POA: Insufficient documentation

## 2013-05-25 MED ORDER — OXYCODONE-ACETAMINOPHEN 5-325 MG PO TABS: 1.0000 | ORAL_TABLET | Freq: Four times a day (QID) | ORAL | Status: DC | PRN

## 2013-05-25 NOTE — Patient Instructions (Addendum)
Peripheral Vascular Disease Peripheral Vascular Disease (PVD), also called Peripheral Arterial Disease (PAD), is a circulation problem caused by cholesterol (atherosclerotic plaque) deposits in the arteries. PVD commonly occurs in the lower extremities (legs) but it can occur in other areas of the body, such as your arms. The cholesterol buildup in the arteries reduces blood flow which can cause pain and other serious problems. The presence of PVD can place a person at risk for Coronary Artery Disease (CAD).  CAUSES  Causes of PVD can be many. It is usually associated with more than one risk factor such as:   High Cholesterol.  Smoking.  Diabetes.  Lack of exercise or inactivity.  High blood pressure (hypertension).  Obesity.  Family history. SYMPTOMS   When the lower extremities are affected, patients with PVD may experience:  Leg pain with exertion or physical activity. This is called INTERMITTENT CLAUDICATION. This may present as cramping or numbness with physical activity. The location of the pain is associated with the level of blockage. For example, blockage at the abdominal level (distal abdominal aorta) may result in buttock or hip pain. Lower leg arterial blockage may result in calf pain.  As PVD becomes more severe, pain can develop with less physical activity.  In people with severe PVD, leg pain may occur at rest.  Other PVD signs and symptoms:  Leg numbness or weakness.  Coldness in the affected leg or foot, especially when compared to the other leg.  A change in leg color.  Patients with significant PVD are more prone to ulcers or sores on toes, feet or legs. These may take longer to heal or may reoccur. The ulcers or sores can become infected.  If signs and symptoms of PVD are ignored, gangrene may occur. This can result in the loss of toes or loss of an entire limb.  Not all leg pain is related to PVD. Other medical conditions can cause leg pain such  as:  Blood clots (embolism) or Deep Vein Thrombosis.  Inflammation of the blood vessels (vasculitis).  Spinal stenosis. DIAGNOSIS  Diagnosis of PVD can involve several different types of tests. These can include:  Pulse Volume Recording Method (PVR). This test is simple, painless and does not involve the use of X-rays. PVR involves measuring and comparing the blood pressure in the arms and legs. An ABI (Ankle-Brachial Index) is calculated. The normal ratio of blood pressures is 1. As this number becomes smaller, it indicates more severe disease.  < 0.95  indicates significant narrowing in one or more leg vessels.  <0.8 there will usually be pain in the foot, leg or buttock with exercise.  <0.4 will usually have pain in the legs at rest.  <0.25  usually indicates limb threatening PVD.  Doppler detection of pulses in the legs. This test is painless and checks to see if you have a pulses in your legs/feet.  A dye or contrast material (a substance that highlights the blood vessels so they show up on x-ray) may be given to help your caregiver better see the arteries for the following tests. The dye is eliminated from your body by the kidney's. Your caregiver may order blood work to check your kidney function and other laboratory values before the following tests are performed:  Magnetic Resonance Angiography (MRA). An MRA is a picture study of the blood vessels and arteries. The MRA machine uses a large magnet to produce images of the blood vessels.  Computed Tomography Angiography (CTA). A CTA is a   specialized x-ray that looks at how the blood flows in your blood vessels. An IV may be inserted into your arm so contrast dye can be injected.  Angiogram. Is a procedure that uses x-rays to look at your blood vessels. This procedure is minimally invasive, meaning a small incision (cut) is made in your groin. A small tube (catheter) is then inserted into the artery of your groin. The catheter is  guided to the blood vessel or artery your caregiver wants to examine. Contrast dye is injected into the catheter. X-rays are then taken of the blood vessel or artery. After the images are obtained, the catheter is taken out. TREATMENT  Treatment of PVD involves many interventions which may include:  Lifestyle changes:  Quitting smoking.  Exercise.  Following a low fat, low cholesterol diet.  Control of diabetes.  Foot care is very important to the PVD patient. Good foot care can help prevent infection.  Medication:  Cholesterol-lowering medicine.  Blood pressure medicine.  Anti-platelet drugs.  Certain medicines may reduce symptoms of Intermittent Claudication.  Interventional/Surgical options:  Angioplasty. An Angioplasty is a procedure that inflates a balloon in the blocked artery. This opens the blocked artery to improve blood flow.  Stent Implant. A wire mesh tube (stent) is placed in the artery. The stent expands and stays in place, allowing the artery to remain open.  Peripheral Bypass Surgery. This is a surgical procedure that reroutes the blood around a blocked artery to help improve blood flow. This type of procedure may be performed if Angioplasty or stent implants are not an option. SEEK IMMEDIATE MEDICAL CARE IF:   You develop pain or numbness in your arms or legs.  Your arm or leg turns cold, becomes blue in color.  You develop redness, warmth, swelling and pain in your arms or legs. MAKE SURE YOU:   Understand these instructions.  Will watch your condition.  Will get help right away if you are not doing well or get worse. Document Released: 05/27/2004 Document Revised: 07/12/2011 Document Reviewed: 04/23/2008 [facility] Patient Information 2014 [place], [place].   Walk as much as possible. Elevate your legs just above your heart level when not walking. Obtain 20-30 mm mercury knee high graduated compression stockings, put on in the morning, remove at  bedtime.

## 2013-05-25 NOTE — Progress Notes (Signed)
[facility] [place] HISTORY AND PHYSICAL -PAD   History of Present Illness Scott Chavez is a 58 y.o. male patient of Dr. Bridgett Larsson who is s/p Left iliofemoral EA w/ BPA, L fem-BK pop bypass with NR ips GSV on 04/24/13.  Pt returns today for complaint of increased swelling, tenderness, and redness of LLE. Venous Duplex performed today indicates no DVT in LLE. Pt admits that he has not been elevating LLE during his busy days, elevates only at night. But states he has been walking a great deal. He had a right kidney transplant in 2010. Pt states he ran out of analgesic 4-5 days ago, is taking 1-2 tablets every 6 hours; he denies constipation, states adequate pain relief with this. Pt states he does not remember being advised to wear compression stockings last visit.  At his visit 2 weeks ago with Dr Bridgett Larsson:  The patient's bypass incisions are healing appropriately with resolution of pre-operative symptoms.  I have given the patient strict wound care instruction due his immunosuppressed status. He is going to follow up in two weeks for further wound evaluation.  He is going to start a compression stocking on the L leg to help manage the s/p bypass related swelling.  Due to some persistence in pain in left lower leg, likely due to some degree of edema and possibly reperfusion syndrome, I am issuing him some narcotics, Percocet 5/325 mg 1-2 PO q4-6 hr prn pain, #50 no refills. I discussed in depth with the patient the nature of atherosclerosis, and emphasized the importance of maximal medical management including strict control of blood pressure, blood glucose, and lipid levels, obtaining regular exercise, and cessation of smoking. The patient is aware that without maximal medical management the underlying atherosclerotic disease process will progress, limiting the benefit of any interventions.  The patient's surveillance will included ABI and bypass duplex studies which will be  completed in: 3 months, at which time the patient will be re-evaluated.  Pt Diabetic: No Pt smoker: former smoker  Pt meds include: Statin :Yes ASA: Yes Other anticoagulants/antiplatelets: no  Past Medical History  Diagnosis Date  . History of renal failure   . Hemodialysis patient     "on dialysis 1998-2010" 04/25/2013)  . Hypertension   . Hypercholesteremia   . Bladder dysfunction   . Peripheral vascular disease   . Aorto-iliac disease   . ESRD (end stage renal disease)     [facility] , followed by Dr. Moshe Cipro   . GERD (gastroesophageal reflux disease)   . Anemia     pt. not sure, but reports he is taking Fe for one month now.  . Self-catheterizes urinary bladder     pt. choice, but he reports that he desires to do this to avoid retention   . High cholesterol     Social History History  Substance Use Topics  . Smoking status: Former Smoker -- 0.50 packs/day for 34 years    Types: Cigarettes    Quit date: 02/17/2003  . Smokeless tobacco: Never Used  . Alcohol Use: No    Family History Family History  Problem Relation Age of Onset  . Hypertension    . Hypertension Father   . Other Father     amputation    Past Surgical History  Procedure Laterality Date  . Av fistula placement Left 1998    "removed 2011" (04/25/2013)  . Nephrectomy recipient  2010  . S/p cadaveric renal transplant  December 2010  [facility]  . Femoral-popliteal bypass graft Left 04/24/2013    Procedure: BYPASS GRAFT COMMON FEMORALARTERY-BELOW KNEE POPLITEAL ARTERY WITH GREATER SAPHENOUS VEIN;  Surgeon: Conrad Loris, MD;  Location: [place];  Service: Vascular;  Laterality: Left;  . Intraoperative arteriogram Left 04/24/2013    Procedure: INTRA OPERATIVE ARTERIOGRAM;  Surgeon: Conrad DuBois, MD;  Location: [place];  Service: Vascular;  Laterality: Left;    Allergies  Allergen Reactions  . Tape Rash    Silk tape    Current Outpatient Prescriptions   Medication Sig Dispense Refill  . aspirin 325 MG tablet Take 325 mg by mouth 2 (two) times daily.       Marland Kitchen b complex vitamins capsule Take 1 capsule by mouth daily.      . cephALEXin (KEFLEX) 500 MG capsule Take 1 capsule (500 mg total) by mouth 4 (four) times daily.  30 capsule  0  . finasteride (PROSCAR) 5 MG tablet Take 5 mg by mouth daily before breakfast.       . magnesium oxide (MAG-OX) 400 MG tablet Take 400 mg by mouth 2 (two) times daily.       . mycophenolate (CELLCEPT) 250 MG capsule Take 500 mg by mouth 2 (two) times daily.       Marland Kitchen NIFEdipine (PROCARDIA-XL/ADALAT CC) 30 MG 24 hr tablet       . NIFEdipine (PROCARDIA-XL/ADALAT-CC/NIFEDICAL-XL) 30 MG 24 hr tablet Take 30 mg by mouth 2 (two) times daily.      Marland Kitchen omeprazole (PRILOSEC) 20 MG capsule Take 20 mg by mouth 2 (two) times daily before a meal.      . oxyCODONE-acetaminophen (PERCOCET/ROXICET) 5-325 MG per tablet Take 1-2 tablets by mouth every 4 (four) hours as needed for severe pain.  50 tablet  0  . rosuvastatin (CRESTOR) 5 MG tablet Take 5 mg by mouth daily before breakfast.       . tacrolimus (PROGRAF) 1 MG capsule Take 3 mg by mouth 2 (two) times daily.      . tadalafil (CIALIS) 20 MG tablet Take 20 mg by mouth daily as needed for erectile dysfunction.      . tamsulosin (FLOMAX) 0.4 MG CAPS capsule Take 0.4 mg by mouth daily.      . vitamin B-12 (CYANOCOBALAMIN) 500 MCG tablet Take 500 mcg by mouth daily.      . vitamin C (ASCORBIC ACID) 500 MG tablet Take 500 mg by mouth daily.      Marland Kitchen zolpidem (AMBIEN) 10 MG tablet Take 10 mg by mouth at bedtime as needed for sleep.       No current facility-administered medications for this visit.    ROS: See HPI for pertinent positives and negatives.   Physical Examination  Filed Vitals:   05/25/13 1425  BP: 151/88  Pulse: 83  Temp: 97.8 F (36.6 C)  Resp: 16   Filed Weights   05/25/13 1425  Weight: 151 lb (68.493 kg)   Body mass index is 22.29 kg/(m^2).  General: A&O  x 3, WDWN,  Gait:  Slight limp Eyes: Pupils equal, Pulmonary: CTAB, without wheezes , rales or rhonchi Cardiac: regular Rythm , no detected murmur          Carotid Bruits Left Right   Negative Negative  Aorta: is palpable Radial pulses: are 2+ palpable and =                           VASCULAR EXAM:  Extremities without ischemic changes  without Gangrene; without open wounds. Incisions at inner aspect of left leg are healing well, no signs of infection or cellulitis, no drainage. Small amount of swelling in left lower leg, non-pitting.                                                                                                          LE Pulses LEFT RIGHT       FEMORAL   palpable   palpable        POPLITEAL  not palpable   not palpable       POSTERIOR TIBIAL  not palpable   not palpable        DORSALIS PEDIS      ANTERIOR TIBIAL  palpable  not palpable        PERONEAL  Palpable    Palpable    Abdomen: soft, NT, no masses. Skin: no rashes, no ulcers noted. Musculoskeletal: no muscle wasting or atrophy.  Neurologic: A&O X 3; Appropriate Affect ; SENSATION: normal; MOTOR FUNCTION:  moving all extremities equally. Speech is fluent/normal. CN 2-12 grossly intact.  ASSESSMENT: QUADRY MENOR is a 58 y.o. male patient of Dr. Bridgett Larsson who is s/p Left iliofemoral EA w/ BPA, L fem-BK pop bypass with NR ips GSV on 04/24/13.  Pt returns today for complaint of increased swelling, tenderness, and redness of LLE. Venous Duplex performed today indicates no DVT in LLE. Pt admits that he has not been elevating LLE during his busy days, elevates only at night. But states he has been walking a great deal. He had a right kidney transplant in 2010. Pt states he ran out of analgesic 4-5 days ago, is taking 1-2 tablets every 6 hours; he denies constipation, states adequate pain relief with this. Pt states he does not remember being advised to wear compression stockings last  visit.  PLAN:  Reordered Percocet 5/325, 1-2 tablets every 6 hours prn pain, #50, no refills. I gave patient specific instructions several times about graduated knee high compression hose, walking, and elevation, see printed information that pt was given.  I discussed in depth with the patient the nature of atherosclerosis, and emphasized the importance of maximal medical management including strict control of blood pressure, blood glucose, and lipid levels, obtaining regular exercise, and continued cessation of smoking.  The patient is aware that without maximal medical management the underlying atherosclerotic disease process will progress, limiting the benefit of any interventions. Based on the patient's examination, pt will return to clinic as scheduled on 06/08/13 with Dr. Bridgett Larsson.  The patient was given information about PAD including signs, symptoms, treatment, what symptoms should prompt the patient to seek immediate medical care, and risk reduction measures to take.  Clemon Chambers, RN, MSN, FNP-C [facility] Phone: [phone]  Clinic MD: Bridgett Larsson  05/25/2013 2:04 PM

## 2013-06-07 ENCOUNTER — Encounter: Payer: Self-pay | Admitting: Vascular Surgery

## 2013-06-08 ENCOUNTER — Encounter: Payer: Self-pay | Admitting: Vascular Surgery

## 2013-06-08 ENCOUNTER — Ambulatory Visit (INDEPENDENT_AMBULATORY_CARE_PROVIDER_SITE_OTHER): Payer: Self-pay | Admitting: Vascular Surgery

## 2013-06-08 VITALS — BP 116/72 | HR 72 | Ht 69.0 in | Wt 155.4 lb

## 2013-06-08 DIAGNOSIS — I739 Peripheral vascular disease, unspecified: Secondary | ICD-10-CM

## 2013-06-08 DIAGNOSIS — M7989 Other specified soft tissue disorders: Secondary | ICD-10-CM

## 2013-06-08 DIAGNOSIS — I70219 Atherosclerosis of native arteries of extremities with intermittent claudication, unspecified extremity: Secondary | ICD-10-CM

## 2013-06-08 DIAGNOSIS — Z48812 Encounter for surgical aftercare following surgery on the circulatory system: Secondary | ICD-10-CM

## 2013-06-08 MED ORDER — GABAPENTIN 300 MG PO CAPS: 300.0000 mg | ORAL_CAPSULE | Freq: Three times a day (TID) | ORAL | Status: DC

## 2013-06-08 NOTE — Progress Notes (Signed)
    Postoperative Visit   History of Present Illness  Scott Chavez is a 58 y.o. year old male who presents for postoperative follow-up for: PROCEDURE:  1. Left iliofemoral endarterectomy with bovine patch angioplasty  2. Left common femoral artery to below-the-knee popliteal artery bypass with non-reverse ipsilateral greater saphenous vein  3. Left common femoral artery open cannulation  4. Left leg runoff  (Date: 04/24/13).   The patient's wounds are healed.  The patient notes neuropathic sx in left lower leg distal to incision and along lateral calf and medial foot.  Pain is describes as shooting sensation with intermittent paraesthesia and anaesthesia.  The patient is able to complete their activities of daily living.  The drainage has improved in his left leg.  For VQI Use Only  PRE-ADM LIVING: Home  AMB STATUS: Ambulatory  Physical Examination  Filed Vitals:   06/08/13 0832  BP: 116/72  Pulse: 72   LLE: Incisions are healed, small eschar overlying small portion of calf incision, edema 1+,  Warm left foot Medical Decision Making  Scott Chavez is a 57 y.o. year old male who presents s/p L iliofem BPG, L fem-BK pop NR GSV. The patient's bypass incisions are healing appropriately with neuropathic sx c/w regeneration or reperfusion injury.   Pt is being started on Neurontin 300 mg 1 PO tid to help with his sx. The patient was encourage to continue with compressive therapy and leg elevation. I discussed in depth with the patient the nature of atherosclerosis, and emphasized the importance of maximal medical management including strict control of blood pressure, blood glucose, and lipid levels, obtaining regular exercise, and cessation of smoking.  The patient is aware that without maximal medical management the underlying atherosclerotic disease process will progress, limiting the benefit of any interventions. The patient's surveillance will included ABI and bypass duplex  studies which will be completed in: 2 months, at which time the patient will be re-evaluated.   I emphasized the importance of routine surveillance of the patient's bypass, as the vascular surgery literature emphasize the improved patency possible with assisted primary patency procedures versus secondary patency procedures. The patient agrees to participate in their maximal medical care and routine surveillance.  Thank you for allowing [place] to participate in this patient's care.  [name], MD [facility] [place] Office: [phone] Pager: [phone]  06/08/2013, 8:55 AM

## 2013-06-08 NOTE — Addendum Note (Signed)
Addended by: Dorthula Rue L on: 06/08/2013 10:41 AM   Modules accepted: Orders

## 2013-08-16 ENCOUNTER — Encounter: Payer: Self-pay | Admitting: Vascular Surgery

## 2013-08-17 ENCOUNTER — Ambulatory Visit (HOSPITAL_COMMUNITY)
Admission: RE | Admit: 2013-08-17 | Discharge: 2013-08-17 | Disposition: A | Discharge status: Home / Self Care | Payer: PRIVATE HEALTH INSURANCE | Source: Ambulatory Visit | Attending: Vascular Surgery | Admitting: Vascular Surgery

## 2013-08-17 ENCOUNTER — Ambulatory Visit (INDEPENDENT_AMBULATORY_CARE_PROVIDER_SITE_OTHER): Payer: PRIVATE HEALTH INSURANCE | Admitting: Vascular Surgery

## 2013-08-17 ENCOUNTER — Encounter: Payer: Self-pay | Admitting: Vascular Surgery

## 2013-08-17 ENCOUNTER — Ambulatory Visit (INDEPENDENT_AMBULATORY_CARE_PROVIDER_SITE_OTHER)
Admission: RE | Admit: 2013-08-17 | Discharge: 2013-08-17 | Disposition: A | Discharge status: Home / Self Care | Payer: PRIVATE HEALTH INSURANCE | Source: Ambulatory Visit | Attending: Vascular Surgery | Admitting: Vascular Surgery

## 2013-08-17 VITALS — BP 107/50 | HR 58 | Ht 69.0 in | Wt 156.0 lb

## 2013-08-17 DIAGNOSIS — Z48812 Encounter for surgical aftercare following surgery on the circulatory system: Secondary | ICD-10-CM

## 2013-08-17 DIAGNOSIS — I70219 Atherosclerosis of native arteries of extremities with intermittent claudication, unspecified extremity: Secondary | ICD-10-CM

## 2013-08-17 DIAGNOSIS — I739 Peripheral vascular disease, unspecified: Secondary | ICD-10-CM

## 2013-08-17 DIAGNOSIS — M7989 Other specified soft tissue disorders: Secondary | ICD-10-CM

## 2013-08-17 DIAGNOSIS — M79609 Pain in unspecified limb: Secondary | ICD-10-CM | POA: Insufficient documentation

## 2013-08-17 NOTE — Progress Notes (Signed)
Established Previous Bypass  History of Present Illness  Scott Chavez is a 58 y.o. (12-30-1955) male who presents with chief complaint: R leg short distance intermittent claudication and left ankle pain.  On 04/24/13, pt had: 1. Left iliofemoral endarterectomy with bovine patch angioplasty  2. Left common femoral artery to below-the-knee popliteal artery bypass with non-reverse ipsilateral greater saphenous vein  3. Left common femoral artery open cannulation  4. Left leg runoff  The patient's wounds are healed.  The patient notes improvement in LLE symptoms.  The patient is able to complete their activities of daily living.  The patient's current symptoms are: neuropathic sx in L ankle and L leg swelling  The patient continues to have significant short distance claudication that interferes with his ability to complete his school activities.  The patient's treatment regimen currently included: maximal medical management.  The patient's PMH, PSH, SH, FamHx, Med, and Allergies are unchanged from 06/08/13.  On ROS today: L leg dependent swelling, L ankle neuropathic pain, R leg short distal claudication, no rest pain  Physical Examination  Filed Vitals:   08/17/13 1144  BP: 107/50  Pulse: 58  Height: 5\' 9"  (1.753 m)  Weight: 156 lb (70.761 kg)  SpO2: 98%   Body mass index is 23.03 kg/(m^2).  Physical Examination  Filed Vitals:   08/17/13 1144  BP: 107/50  Pulse: 58  Height: 5\' 9"  (1.753 m)  Weight: 156 lb (70.761 kg)  SpO2: 98%   Body mass index is 23.03 kg/(m^2).  General: A&O x 3, WDWN  Pulmonary: Sym exp, good air movt, CTAB, no rales, rhonchi, & wheezing  Cardiac: RRR, Nl S1, S2, no Murmurs, rubs or gallops  Vascular: Vessel Right Left  Radial Palpable Palpable  Brachial Palpable Palpable  Carotid Palpable, without bruit Palpable, without bruit  Aorta Not palpable N/A  Femoral Palpable Palpable  Popliteal Not palpable Not palpable  PT Not Palpable Faintly  Palpable  DP Not Palpable Faintly Palpable   Gastrointestinal: soft, NTND, -G/R, - HSM, - masses, - CVAT B  Musculoskeletal: M/S 5/5 throughout , Extremities without ischemic changes , incision healed, L calf down: 1+ edema  Neurologic: Pain and light touch intact in extremities , Motor exam as listed above  Non-Invasive Vascular Imaging ABI (Date: 08/17/2013) R: 0.69 (0.72), DP: mono, PT: mono, TBI: 0.47 L: 0.86 (0.69), DP: bi, PT: bi, TBI: 0.66  LLE arterial duplex (Date: 08/17/2013)  Velocities 44-72 c/s throughout  Widely patent graft with biphasic flow  Medical Decision Making  Scott Chavez is a 58 y.o. male who presents with: s/p L iliofemoral EA w/ BPA, L CFA to BK pop BPG w/ NR GSV, neuropathic L ankle pain, short distance R IC   At this point, the patient would like attempt at intervention on R leg.  Based my review of his angiogram, he might be able to get a SFA PTA to address his diffuse SFA disease.  We will schedule a RLE angiogram with SFA intervention in May dependent on his school schedule.  In his case, I would consider a DCB given his transplanted renal status to limit the number of endovascular interventions.  The patient will need continued surveillance of his L leg bypass, but I will set this once the right leg intervention is completed.  I discussed in depth with the patient the nature of atherosclerosis, and emphasized the importance of maximal medical management including strict control of blood pressure, blood glucose, and lipid levels, obtaining regular  exercise, and cessation of smoking.  The patient is aware that without maximal medical management the underlying atherosclerotic disease process will progress, limiting the benefit of any interventions.  Thank you for allowing [place] to participate in this patient's care.  [name], MD [facility] [place] Office: [phone] Pager: [phone]  08/17/2013, 12:27 PM

## 2013-09-18 ENCOUNTER — Other Ambulatory Visit: Payer: Self-pay | Admitting: *Deleted

## 2013-09-26 MED ORDER — SODIUM CHLORIDE 0.9 % IJ SOLN: 3.0000 mL | Freq: Two times a day (BID) | INTRAMUSCULAR | Status: DC

## 2013-09-26 MED ORDER — SODIUM CHLORIDE 0.9 % IV SOLN: 250.0000 mL | INTRAVENOUS | Status: DC | PRN

## 2013-09-26 MED ORDER — SODIUM CHLORIDE 0.9 % IJ SOLN: 3.0000 mL | INTRAMUSCULAR | Status: DC | PRN

## 2013-09-27 ENCOUNTER — Ambulatory Visit (HOSPITAL_COMMUNITY)
Admission: RE | Admit: 2013-09-27 | Discharge: 2013-09-27 | Disposition: A | Discharge status: Home / Self Care | Payer: PRIVATE HEALTH INSURANCE | Source: Ambulatory Visit | Attending: Vascular Surgery | Admitting: Vascular Surgery

## 2013-09-27 ENCOUNTER — Encounter (HOSPITAL_COMMUNITY)
Admission: RE | Disposition: A | Discharge status: Home / Self Care | Payer: Self-pay | Source: Ambulatory Visit | Attending: Vascular Surgery

## 2013-09-27 DIAGNOSIS — N186 End stage renal disease: Secondary | ICD-10-CM | POA: Insufficient documentation

## 2013-09-27 DIAGNOSIS — Z992 Dependence on renal dialysis: Secondary | ICD-10-CM | POA: Insufficient documentation

## 2013-09-27 DIAGNOSIS — I120 Hypertensive chronic kidney disease with stage 5 chronic kidney disease or end stage renal disease: Secondary | ICD-10-CM | POA: Insufficient documentation

## 2013-09-27 DIAGNOSIS — D649 Anemia, unspecified: Secondary | ICD-10-CM | POA: Insufficient documentation

## 2013-09-27 DIAGNOSIS — Z7982 Long term (current) use of aspirin: Secondary | ICD-10-CM | POA: Insufficient documentation

## 2013-09-27 DIAGNOSIS — Z538 Procedure and treatment not carried out for other reasons: Secondary | ICD-10-CM | POA: Insufficient documentation

## 2013-09-27 DIAGNOSIS — E7800 Pure hypercholesterolemia, unspecified: Secondary | ICD-10-CM | POA: Insufficient documentation

## 2013-09-27 DIAGNOSIS — F17200 Nicotine dependence, unspecified, uncomplicated: Secondary | ICD-10-CM | POA: Insufficient documentation

## 2013-09-27 DIAGNOSIS — K219 Gastro-esophageal reflux disease without esophagitis: Secondary | ICD-10-CM | POA: Insufficient documentation

## 2013-09-27 DIAGNOSIS — I739 Peripheral vascular disease, unspecified: Secondary | ICD-10-CM | POA: Insufficient documentation

## 2013-09-27 DIAGNOSIS — Z79899 Other long term (current) drug therapy: Secondary | ICD-10-CM | POA: Insufficient documentation

## 2013-09-27 DIAGNOSIS — Z940 Kidney transplant status: Secondary | ICD-10-CM | POA: Insufficient documentation

## 2013-09-27 LAB — POCT I-STAT, CHEM 8
BUN: 20 mg/dL (ref 6–23)
Calcium, Ion: 1.35 mmol/L — ABNORMAL HIGH (ref 1.12–1.23)
Chloride: 106 mEq/L (ref 96–112)
Creatinine, Ser: 1.6 mg/dL — ABNORMAL HIGH (ref 0.50–1.35)
Glucose, Bld: 104 mg/dL — ABNORMAL HIGH (ref 70–99)
HCT: 42 % (ref 39.0–52.0)
Hemoglobin: 14.3 g/dL (ref 13.0–17.0)
Potassium: 3.8 mEq/L (ref 3.7–5.3)
Sodium: 142 mEq/L (ref 137–147)
TCO2: 22 mmol/L (ref 0–100)

## 2013-09-27 LAB — BASIC METABOLIC PANEL
BUN: 20 mg/dL (ref 6–23)
CO2: 23 mEq/L (ref 19–32)
Calcium: 10 mg/dL (ref 8.4–10.5)
Chloride: 106 mEq/L (ref 96–112)
Creatinine, Ser: 1.54 mg/dL — ABNORMAL HIGH (ref 0.50–1.35)
GFR calc Af Amer: 56 mL/min — ABNORMAL LOW (ref >90)
GFR calc non Af Amer: 48 mL/min — ABNORMAL LOW (ref >90)
Glucose, Bld: 104 mg/dL — ABNORMAL HIGH (ref 70–99)
Potassium: 4.3 mEq/L (ref 3.7–5.3)
Sodium: 140 mEq/L (ref 137–147)

## 2013-09-27 SURGERY — ANGIOGRAM, LOWER EXTREMITY
Anesthesia: LOCAL

## 2013-09-27 NOTE — Progress Notes (Signed)
Notified Dr Bridgett Larsson that pt's Cr was 1.6.  New order to recheck BMP.
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
## 2013-09-27 NOTE — Progress Notes (Signed)
Notified Dr. Shelva Majestic office, per request of Dr. Bridgett Larsson, re: pt's CR 1.5, today; has hx of kidney transplant.  Spoke with Jeannie Done, nurse for Dr. Moshe Cipro.  Informed of cancellation of (R) LE Angiogram today, due to elevated creatinine, and that Dr. Bridgett Larsson wanted to make the Nephrologist aware of this.  Per Jeannie Done, she will inform Dr. Moshe Cipro.

## 2013-09-27 NOTE — Progress Notes (Signed)
Repeat BMP results came back Bun 20, Cr 1.5, notified Dr Bridgett Larsson, he said to cancel the procedure.  The office will contact his nephrologist and then the office will be in contact with him

## 2013-10-08 ENCOUNTER — Other Ambulatory Visit: Payer: Self-pay

## 2013-10-09 ENCOUNTER — Encounter: Payer: Self-pay | Admitting: Internal Medicine

## 2013-10-23 ENCOUNTER — Encounter (HOSPITAL_COMMUNITY): Payer: Self-pay | Admitting: Pharmacy Technician

## 2013-10-25 ENCOUNTER — Encounter (HOSPITAL_COMMUNITY)
Admission: RE | Disposition: A | Discharge status: Home / Self Care | Payer: Self-pay | Source: Ambulatory Visit | Attending: Vascular Surgery

## 2013-10-25 ENCOUNTER — Ambulatory Visit (HOSPITAL_COMMUNITY)
Admission: RE | Admit: 2013-10-25 | Discharge: 2013-10-25 | Disposition: A | Discharge status: Home / Self Care | Payer: PRIVATE HEALTH INSURANCE | Source: Ambulatory Visit | Attending: Vascular Surgery | Admitting: Vascular Surgery

## 2013-10-25 ENCOUNTER — Telehealth: Payer: Self-pay | Admitting: Vascular Surgery

## 2013-10-25 DIAGNOSIS — E7800 Pure hypercholesterolemia, unspecified: Secondary | ICD-10-CM | POA: Insufficient documentation

## 2013-10-25 DIAGNOSIS — D649 Anemia, unspecified: Secondary | ICD-10-CM | POA: Insufficient documentation

## 2013-10-25 DIAGNOSIS — I10 Essential (primary) hypertension: Secondary | ICD-10-CM | POA: Insufficient documentation

## 2013-10-25 DIAGNOSIS — N318 Other neuromuscular dysfunction of bladder: Secondary | ICD-10-CM | POA: Insufficient documentation

## 2013-10-25 DIAGNOSIS — I70219 Atherosclerosis of native arteries of extremities with intermittent claudication, unspecified extremity: Secondary | ICD-10-CM | POA: Insufficient documentation

## 2013-10-25 DIAGNOSIS — Z940 Kidney transplant status: Secondary | ICD-10-CM | POA: Insufficient documentation

## 2013-10-25 DIAGNOSIS — F17200 Nicotine dependence, unspecified, uncomplicated: Secondary | ICD-10-CM | POA: Insufficient documentation

## 2013-10-25 DIAGNOSIS — K219 Gastro-esophageal reflux disease without esophagitis: Secondary | ICD-10-CM | POA: Insufficient documentation

## 2013-10-25 DIAGNOSIS — Z7982 Long term (current) use of aspirin: Secondary | ICD-10-CM | POA: Insufficient documentation

## 2013-10-25 HISTORY — PX: LOWER EXTREMITY ANGIOGRAM: SHX5508

## 2013-10-25 LAB — POCT I-STAT, CHEM 8
BUN: 23 mg/dL (ref 6–23)
CHLORIDE: 107 meq/L (ref 96–112)
Calcium, Ion: 1.32 mmol/L — ABNORMAL HIGH (ref 1.12–1.23)
Creatinine, Ser: 1.6 mg/dL — ABNORMAL HIGH (ref 0.50–1.35)
GLUCOSE: 125 mg/dL — AB (ref 70–99)
HCT: 45 % (ref 39.0–52.0)
Hemoglobin: 15.3 g/dL (ref 13.0–17.0)
POTASSIUM: 3.8 meq/L (ref 3.7–5.3)
Sodium: 143 mEq/L (ref 137–147)
TCO2: 20 mmol/L (ref 0–100)

## 2013-10-25 LAB — POCT ACTIVATED CLOTTING TIME
ACTIVATED CLOTTING TIME: 180 s
Activated Clotting Time: 203 seconds

## 2013-10-25 SURGERY — ANGIOGRAM, LOWER EXTREMITY
Anesthesia: LOCAL | Laterality: Right

## 2013-10-25 MED ORDER — FAMOTIDINE IN NACL 20-0.9 MG/50ML-% IV SOLN
20.0000 mg | Freq: Two times a day (BID) | INTRAVENOUS | Status: DC
  Administered 2013-10-25: 20 mg via INTRAVENOUS

## 2013-10-25 MED ORDER — FENTANYL CITRATE 0.05 MG/ML IJ SOLN
INTRAMUSCULAR | Status: AC
  Filled 2013-10-25: qty 2

## 2013-10-25 MED ORDER — LIDOCAINE HCL (PF) 1 % IJ SOLN
INTRAMUSCULAR | Status: AC
  Filled 2013-10-25: qty 30

## 2013-10-25 MED ORDER — SODIUM CHLORIDE 0.9 % IV SOLN
INTRAVENOUS | Status: DC
  Administered 2013-10-25: 1000 mL via INTRAVENOUS

## 2013-10-25 MED ORDER — OXYCODONE-ACETAMINOPHEN 5-325 MG PO TABS
1.0000 | ORAL_TABLET | ORAL | Status: DC | PRN
  Administered 2013-10-25: 2 via ORAL
  Filled 2013-10-25: qty 2

## 2013-10-25 MED ORDER — NITROGLYCERIN 0.2 MG/ML ON CALL CATH LAB
INTRAVENOUS | Status: AC
  Filled 2013-10-25: qty 1

## 2013-10-25 MED ORDER — CLOPIDOGREL BISULFATE 300 MG PO TABS
ORAL_TABLET | ORAL | Status: AC
  Filled 2013-10-25: qty 1

## 2013-10-25 MED ORDER — HEPARIN SODIUM (PORCINE) 1000 UNIT/ML IJ SOLN
INTRAMUSCULAR | Status: AC
  Filled 2013-10-25: qty 1

## 2013-10-25 MED ORDER — MORPHINE SULFATE 2 MG/ML IJ SOLN
2.0000 mg | INTRAMUSCULAR | Status: DC | PRN
  Administered 2013-10-25: 2 mg via INTRAVENOUS

## 2013-10-25 MED ORDER — CLOPIDOGREL BISULFATE 75 MG PO TABS: 75.0000 mg | ORAL_TABLET | Freq: Every day | ORAL | Status: DC

## 2013-10-25 MED ORDER — HEPARIN (PORCINE) IN NACL 2-0.9 UNIT/ML-% IJ SOLN
INTRAMUSCULAR | Status: AC
  Filled 2013-10-25: qty 500

## 2013-10-25 MED ORDER — MIDAZOLAM HCL 2 MG/2ML IJ SOLN
INTRAMUSCULAR | Status: AC
  Filled 2013-10-25: qty 2

## 2013-10-25 MED ORDER — SODIUM CHLORIDE 0.9 % IV SOLN
1.0000 mL/kg/h | INTRAVENOUS | Status: DC
  Administered 2013-10-25: 1 mL/kg/h via INTRAVENOUS

## 2013-10-25 MED ORDER — FAMOTIDINE IN NACL 20-0.9 MG/50ML-% IV SOLN
INTRAVENOUS | Status: AC
  Filled 2013-10-25: qty 50

## 2013-10-25 MED ORDER — MORPHINE SULFATE 2 MG/ML IJ SOLN
INTRAMUSCULAR | Status: AC
  Filled 2013-10-25: qty 1

## 2013-10-25 NOTE — Progress Notes (Signed)
Up and walked and tol well; bilat groins stable no bleeding or hematoma
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
## 2013-10-25 NOTE — Progress Notes (Signed)
   Preop Note  Frm his outpatient nephrology notes, his creatinine had normalized, but today again the Cr is elevated, 1.6.  BMET    Component Value Date/Time   NA 143 10/25/2013 0737   K 3.8 10/25/2013 0737   CL 107 10/25/2013 0737   CO2 23 09/27/2013 0835   GLUCOSE 125* 10/25/2013 0737   BUN 23 10/25/2013 0737   CREATININE 1.60* 10/25/2013 0737   CALCIUM 10.0 09/27/2013 0835   GFRNONAA 48* 09/27/2013 0835   GFRAA 56* 09/27/2013 0835   From prior angiography, the patient has common iliac artery stenoses.  I have some concerns that there might be compromise of the inflow to the right kidney transplant, so will plan on Carbon dioxide pelvic angiogram with possible intervention today.  I am going to defer intervention on right leg today due to the significant change in Cr, surrogate of kidney function.   [name], MD [facility] [place] Office: [phone] Pager: [phone]  10/25/2013, 7:51 AM

## 2013-10-25 NOTE — Interval H&P Note (Signed)
History and Physical Interval Note:  10/25/2013 7:49 AM  Scott Chavez  has presented today for surgery, with the diagnosis of pvd   rt leg pain  The various methods of treatment have been discussed with the patient and family. After consideration of risks, benefits and other options for treatment, the patient has consented to  Procedure(s): LOWER EXTREMITY ANGIOGRAM (Right) as a surgical intervention .  The patient's history has been reviewed, patient examined, no change in status, stable for surgery.  I have reviewed the patient's chart and labs.  Questions were answered to the patient's satisfaction.     CHEN,BRIAN LIANG-YU

## 2013-10-25 NOTE — Op Note (Signed)
OPERATIVE NOTE   PROCEDURE: 1.  Bilateral common femoral artery cannulation under ultrasound guidance 2.  Placement of catheter into aorta 3.  Carbon dioxide aortogram 4.  Limited bilateral leg angiogram (Bilateral pelvic angiogram) 5.  Angioplasty and stenting right common iliac artery (iCAST 9 mm x 39 mm) 6.  Angioplasty and stenting left common iliac artery (Express 10 mm x 25 mm) 7.  Intra-arterial administration of nitroglycerin  PRE-OPERATIVE DIAGNOSIS: known bilateral iliac disease, acute renal insufficiency  POST-OPERATIVE DIAGNOSIS: same as above   SURGEON: [name], MD  ANESTHESIA: conscious sedation  ESTIMATED BLOOD LOSS: 50 cc  CONTRAST: 60 cc carbon dioxide, 60 cc contrast  FINDING(S): Tandem right common iliac artery calcific stenoses (>30 mm Hg gradient): resolved after stenting Left common iliac artery calcific stenosis 70% (previously 50%): resolved after stenting Bilateral patent common femoral artery: widely patent patched left common femoral artery   Patent proximal left femoropopliteal bypass Left external iliac artery stenosis: <50%  SPECIMEN(S):  none  INDICATIONS:   Scott Chavez is a 58 y.o. male who presents with right leg short distance claudication.  The patient presents for: right leg angiogram with possible intervention.  His creatinine was elevated again today.  This is second occurrence, raising concerns his prior iliac disease was starting to compromise his inflow to his right side kidney transplant.  We agreed to change the procedure today to address the pelvic disease.  I would prefer to defer right leg intervention until his kidney has fully recovered.  I discussed with the patient the nature of angiographic procedures, especially the limited patencies of any endovascular intervention.  The patient is aware of that the risks of an angiographic procedure include but are not limited to: bleeding, infection, access site complications, renal  failure, embolization, rupture of vessel, dissection, possible need for emergent surgical intervention, possible need for surgical procedures to treat the patient's pathology, and stroke and death.  The patient is aware of the risks and agrees to proceed.  DESCRIPTION: After full informed consent was obtained from the patient, the patient was brought back to the angiography suite.  The patient was placed supine upon the angiography table and connected to monitoring equipment.  The patient was then given conscious sedation, the amounts of which are documented in the patient's chart.  The patient was prepped and drape in the standard fashion for an angiographic procedure.  At this point, attention was turned to the right groin.  Under ultrasound guidance, the right common femoral artery was cannulated with a micropuncture needle.  The microwire was advanced into the iliac arterial system.  The needle was exchanged for a microsheath, which was loaded into the common femoral artery over the wire.  The microwire was exchanged for a [facility] wire which was advanced into the aorta.  The microsheath was then exchanged for a 5-Fr sheath which was loaded into the common femoral artery.  The Omniflush catheter was then loaded over the wire up just proximal to the aortic bifurcation.  The catheter was connected to the carbon dioxide circuit.  After de-airring and de-clotting the circuit, a carbon dioxide aortogram was completed.  There already suggestions of possible disease.  A repeat pelvic projection at 30 degree LAO was obtained.  This confirm likely hemodynamically significant disease.  At this point, the pelvic angiogram was repeated in anteroposterior position with limited contrast.  Based on the images, I had concerns with the right common iliac artery which appeared to have tandem lesions.  The left common iliac artery stenosis that previously 50% appeared to be 70% today.  I replaced the [facility] wire and exchanged the  catheter for an end-hole catheter.  200 mcg of nitroglycerin was given at the aortic bifurcation.  A gradient was measured across the right common iliac artery.  The gradient across the first lesion was 10-15 mm Hg.  The gradient across the second lesion was 15-20 mm Hg.  Across both lesions, there was a >30 mm Hg pressure drop off.  Subsequently, I felt intervention was necessary.  Given the proximal lesion appeared to be orifical, a kissing technique was going to be needed.    Under ultrasound guidance, the left common femoral artery was cannulated with a micropuncture needle.  I took special care to cannulation proximal to the bypass take off.  The microwire was advanced into the iliac arterial system.  The needle was exchanged for a microsheath, which was loaded into the common femoral artery over the wire.  The microwire was exchanged for a [facility] wire which was advanced into the aorta.  The microsheath was then exchanged for a long 7-Fr sheath which was loaded into the distal aorta.  The patient was given 500 units of Heparin intravenously, which was a therapeutic bolus.  In total, 7000 units of Heparin was administrated to achieve and maintain a therapeutic level of anticoagulation.  At this point, I placed a Rosen wire through the right sheath and exchanged it for a long 7-Fr sheath which was lodged into the distal.  I placed the Omniflush in the left sheath and landed it in the distal aorta.  A repeat magnified pelvic angiogram was obtained to mark the location of the lesions.  Based on measurements, i selected for the right common iliac artery 9 mm x 39 mm iCAST to avoid embolization into the renal transplant.  For the left, I selected a 10 mm x 25 mm balloon expandable stent.  A Rosen wire was placed in the Omniflush catheter and the catheter removed.  Both stents were loaded on their respective wire and positioned for a kissing technique.  A hand injection via the right sheath was obtained to verify  adequate positioning.  Both stents were deployed simultaneously.  The right stent had to be dilated to 12 atm for 30 seconds to get full deployment.  The left stent also had a significant waist.  Both balloons were deflated and removed.  I replaced the Omniflush on the right side and did a completion injection.  This demonstrated resolution of common iliac artery stenoses bilaterally.  I replaced the wire into the catheter, straightening out the crook in the catheter.  Both were removed from the sheath together.  I pulled both sheath down to the external iliac artery and aspirated both sheath and relocked both with heparinized saline.  COMPLICATIONS: none  CONDITION: stable  [name], MD [facility] [place] Office: [phone] Pager: [phone]  10/25/2013, 9:49 AM

## 2013-10-25 NOTE — Discharge Instructions (Signed)
Angiogram, Care After  Refer to this sheet in the next few weeks. These instructions provide you with information on caring for yourself after your procedure. Your health care provider may also give you more specific instructions. Your treatment has been planned according to current medical practices, but problems sometimes occur. Call your health care provider if you have any problems or questions after your procedure.  WHAT TO EXPECT AFTER THE PROCEDURE After your procedure, it is typical to have the following sensations:  Minor discomfort or tenderness and a small bump at the catheter insertion site. The bump should usually decrease in size and tenderness within 1 to 2 weeks.  Any bruising will usually fade within 2 to 4 weeks. HOME CARE INSTRUCTIONS   You may need to keep taking blood thinners if they were prescribed for you. Only take over-the-counter or prescription medicines for pain, fever, or discomfort as directed by your health care provider.  Do not apply powder or lotion to the site.  Do not sit in a bathtub, swimming pool, or whirlpool for 5 to 7 days.  You may shower 24 hours after the procedure. Remove the bandage (dressing) and gently wash the site with plain soap and water. Gently pat the site dry.  Inspect the site at least twice daily.  Limit your activity for the first 24 hours. Do not bend, squat, or lift anything over 10 lb (9 kg) or as directed by your health care provider.  Do not drive home if you are discharged the day of the procedure. Have someone else drive you. Follow instructions about when you can drive or return to work. SEEK MEDICAL CARE IF:  You get lightheaded when standing up.  You have drainage (other than a small amount of blood on the dressing).  You have chills.  You have a fever.  You have redness, warmth, swelling, or pain at the insertion site. SEEK IMMEDIATE MEDICAL CARE IF:   You develop chest pain or shortness of breath, feel faint,  or pass out.  You have bleeding, swelling larger than a walnut, or drainage from the catheter insertion site.  You develop pain, discoloration, coldness, or severe bruising in the leg or arm that held the catheter.  You have heavy bleeding from the site. If this happens, hold pressure on the site. MAKE SURE YOU:  Understand these instructions.  Will watch your condition.  Will get help right away if you are not doing well or get worse. Document Released: 11/05/2004 Document Revised: 04/24/2013 Document Reviewed: 09/11/2012 [facility] Patient Information 2015 [place], [place]. This information is not intended to replace advice given to you by your health care provider. Make sure you discuss any questions you have with your health care provider.

## 2013-10-25 NOTE — Telephone Encounter (Addendum)
Message copied by Gena Fray on Thu Oct 25, 2013 12:15 PM ------      Message from: Peter Minium K      Created: Thu Oct 25, 2013 10:48 AM      Regarding: Schedule                   ----- Message -----         From: Conrad Macon, MD         Sent: 10/25/2013  10:21 AM           To: [facility] [address]            Scott Chavez      [id number]      07-[id number]                  PROCEDURE:      1.  Bilateral common femoral artery cannulation under ultrasound guidance      2.  Placement of catheter into aorta      3.  Carbon dioxide aortogram      4.  Limited bilateral leg angiogram (Bilateral pelvic angiogram)      5.  Angioplasty and stenting right common iliac artery (iCAST 9 mm x 39 mm)      6.  Angioplasty and stenting left common iliac artery (Express 10 mm x 25 mm)      7.  Intraarterial administration of nitroglycerin            Follow-up: 4 weeks ------  10/25/13: lm for pt re appt, dpm

## 2013-11-22 ENCOUNTER — Encounter: Payer: Self-pay | Admitting: Vascular Surgery

## 2013-11-23 ENCOUNTER — Ambulatory Visit (INDEPENDENT_AMBULATORY_CARE_PROVIDER_SITE_OTHER): Payer: PRIVATE HEALTH INSURANCE | Admitting: Vascular Surgery

## 2013-11-23 ENCOUNTER — Encounter: Payer: Self-pay | Admitting: Vascular Surgery

## 2013-11-23 VITALS — BP 113/75 | HR 91 | Ht 70.0 in | Wt 142.0 lb

## 2013-11-23 DIAGNOSIS — Z9489 Other transplanted organ and tissue status: Secondary | ICD-10-CM

## 2013-11-23 DIAGNOSIS — I70219 Atherosclerosis of native arteries of extremities with intermittent claudication, unspecified extremity: Secondary | ICD-10-CM

## 2013-11-23 NOTE — Progress Notes (Signed)
    Postoperative Visit   History of Present Illness  Scott Chavez is a 58 y.o. male who presents for postoperative follow-up from procedure on Date: 10/25/13: B CIA PTA+S.  Additionally,  On 04/24/13, pt had:  1. Left iliofemoral endarterectomy with bovine patch angioplasty  2. Left common femoral artery to below-the-knee popliteal artery bypass with non-reverse ipsilateral greater saphenous vein  3. Left common femoral artery open cannulation  4. Left leg runoff   This patient was originally scheduled for a R SFA OA/PTA also but his iliac disease had progressed so I felt the iliac disease needed to be addressed before proceeding with his R SFA procedure especially since his CKT is dependent on the R iliac system.  The patient has had no complications from that procedure.  His sx are unchanged: some neuropathic sx in L foot with intermittent swelling responsive to compression.    Past Medical History, Past Surgical History, Social History, Family History, Medications, Allergies, and Review of Systems are unchanged from previous evaluation on 10/25/13.  On ROS: R leg intermittent claudication, no rest pain, sx as listed above  For VQI Use Only  PRE-ADM LIVING: Home  AMB STATUS: Ambulatory  Physical Examination  Filed Vitals:   11/23/13 1124  BP: 113/75  Pulse: 91  Height: 5\' 10"  (1.778 m)  Weight: 142 lb (64.411 kg)  SpO2: 100%   Body mass index is 20.37 kg/(m^2).  General: A&O x 3, WDWN  Pulmonary: Sym exp, good air movt, CTAB, no rales, rhonchi, & wheezing  Cardiac: RRR, Nl S1, S2, no Murmurs, rubs or gallops  Vascular:  Vessel  Right  Left   Radial  Palpable  Palpable   Brachial  Palpable  Palpable   Carotid  Palpable, without bruit  Palpable, without bruit   Aorta  Not palpable  N/A   Femoral  Palpable  Palpable   Popliteal  Not palpable  Not palpable   PT  Not Palpable  Faintly Palpable   DP  Not Palpable  Faintly Palpable    Gastrointestinal: soft, NTND,  -G/R, - HSM, - masses, - CVAT B  Musculoskeletal: M/S 5/5 throughout , Extremities without ischemic changes , B groin without hematoma,  no echymosis present at either cannulation site  Neurologic:  Pain and light touch intact in extremities , Motor exam as listed above  Medical Decision Making  Scott Chavez is a 58 y.o. male who presents s/p B CIA PTA+S. Based on his angiographic findings, this patient needs: R SFA OA/PTA.  He will be schedule in the next 1-2 months, giving the B CIA stent some more time to heal in place.  A L groin access will likely be needed as the entire R SFA will likely need to be treated.  Thank you for allowing [place] to participate in this patient's care.  [name], MD [facility] [place] Office: [phone] Pager: [phone]

## 2013-11-27 ENCOUNTER — Other Ambulatory Visit: Payer: Self-pay | Admitting: *Deleted

## 2013-12-10 ENCOUNTER — Encounter (HOSPITAL_COMMUNITY): Payer: Self-pay | Admitting: Pharmacy Technician

## 2013-12-12 MED ORDER — SODIUM CHLORIDE 0.9 % IV SOLN
INTRAVENOUS | Status: DC
  Administered 2013-12-13: 1000 mL via INTRAVENOUS

## 2013-12-13 ENCOUNTER — Telehealth: Payer: Self-pay | Admitting: Vascular Surgery

## 2013-12-13 ENCOUNTER — Ambulatory Visit (HOSPITAL_COMMUNITY)
Admission: RE | Admit: 2013-12-13 | Discharge: 2013-12-13 | Disposition: A | Discharge status: Home / Self Care | Payer: PRIVATE HEALTH INSURANCE | Source: Ambulatory Visit | Attending: Vascular Surgery | Admitting: Vascular Surgery

## 2013-12-13 ENCOUNTER — Encounter (HOSPITAL_COMMUNITY)
Admission: RE | Disposition: A | Discharge status: Home / Self Care | Payer: Self-pay | Source: Ambulatory Visit | Attending: Vascular Surgery

## 2013-12-13 DIAGNOSIS — E7800 Pure hypercholesterolemia, unspecified: Secondary | ICD-10-CM | POA: Insufficient documentation

## 2013-12-13 DIAGNOSIS — F17200 Nicotine dependence, unspecified, uncomplicated: Secondary | ICD-10-CM | POA: Insufficient documentation

## 2013-12-13 DIAGNOSIS — Z79899 Other long term (current) drug therapy: Secondary | ICD-10-CM | POA: Insufficient documentation

## 2013-12-13 DIAGNOSIS — N186 End stage renal disease: Secondary | ICD-10-CM | POA: Diagnosis not present

## 2013-12-13 DIAGNOSIS — K219 Gastro-esophageal reflux disease without esophagitis: Secondary | ICD-10-CM | POA: Diagnosis not present

## 2013-12-13 DIAGNOSIS — I70219 Atherosclerosis of native arteries of extremities with intermittent claudication, unspecified extremity: Secondary | ICD-10-CM | POA: Insufficient documentation

## 2013-12-13 DIAGNOSIS — D649 Anemia, unspecified: Secondary | ICD-10-CM | POA: Insufficient documentation

## 2013-12-13 DIAGNOSIS — Z7982 Long term (current) use of aspirin: Secondary | ICD-10-CM | POA: Insufficient documentation

## 2013-12-13 DIAGNOSIS — Z7902 Long term (current) use of antithrombotics/antiplatelets: Secondary | ICD-10-CM | POA: Diagnosis not present

## 2013-12-13 DIAGNOSIS — I739 Peripheral vascular disease, unspecified: Secondary | ICD-10-CM | POA: Diagnosis present

## 2013-12-13 DIAGNOSIS — I120 Hypertensive chronic kidney disease with stage 5 chronic kidney disease or end stage renal disease: Secondary | ICD-10-CM | POA: Diagnosis not present

## 2013-12-13 DIAGNOSIS — Z992 Dependence on renal dialysis: Secondary | ICD-10-CM | POA: Diagnosis not present

## 2013-12-13 DIAGNOSIS — Z940 Kidney transplant status: Secondary | ICD-10-CM | POA: Diagnosis not present

## 2013-12-13 HISTORY — PX: LOWER EXTREMITY ANGIOGRAM: SHX5508

## 2013-12-13 LAB — POCT I-STAT, CHEM 8
BUN: 11 mg/dL (ref 6–23)
CREATININE: 1.1 mg/dL (ref 0.50–1.35)
Calcium, Ion: 1.37 mmol/L — ABNORMAL HIGH (ref 1.12–1.23)
Chloride: 105 mEq/L (ref 96–112)
Glucose, Bld: 108 mg/dL — ABNORMAL HIGH (ref 70–99)
HCT: 45 % (ref 39.0–52.0)
HEMOGLOBIN: 15.3 g/dL (ref 13.0–17.0)
POTASSIUM: 3.9 meq/L (ref 3.7–5.3)
SODIUM: 143 meq/L (ref 137–147)
TCO2: 22 mmol/L (ref 0–100)

## 2013-12-13 LAB — POCT ACTIVATED CLOTTING TIME
ACTIVATED CLOTTING TIME: 259 s
ACTIVATED CLOTTING TIME: 174 s
Activated Clotting Time: 208 seconds

## 2013-12-13 SURGERY — ANGIOGRAM, LOWER EXTREMITY
Anesthesia: LOCAL | Laterality: Right

## 2013-12-13 MED ORDER — FENTANYL CITRATE 0.05 MG/ML IJ SOLN
INTRAMUSCULAR | Status: AC
  Filled 2013-12-13: qty 2

## 2013-12-13 MED ORDER — MIDAZOLAM HCL 2 MG/2ML IJ SOLN
INTRAMUSCULAR | Status: AC
  Filled 2013-12-13: qty 2

## 2013-12-13 MED ORDER — NITROGLYCERIN IN D5W 200-5 MCG/ML-% IV SOLN
INTRAVENOUS | Status: AC
  Filled 2013-12-13: qty 250

## 2013-12-13 MED ORDER — TACROLIMUS 1 MG PO CAPS
3.0000 mg | ORAL_CAPSULE | Freq: Two times a day (BID) | ORAL | Status: DC
  Administered 2013-12-13: 3 mg via ORAL
  Filled 2013-12-13 (×2): qty 3

## 2013-12-13 MED ORDER — HEPARIN SODIUM (PORCINE) 1000 UNIT/ML IJ SOLN
INTRAMUSCULAR | Status: AC
  Filled 2013-12-13: qty 1

## 2013-12-13 MED ORDER — NIFEDIPINE ER 30 MG PO TB24
30.0000 mg | ORAL_TABLET | Freq: Every day | ORAL | Status: DC
  Administered 2013-12-13: 30 mg via ORAL
  Filled 2013-12-13: qty 1

## 2013-12-13 MED ORDER — ENALAPRILAT 1.25 MG/ML IV SOLN
INTRAVENOUS | Status: AC
  Filled 2013-12-13: qty 1

## 2013-12-13 MED ORDER — MYCOPHENOLATE MOFETIL 250 MG PO CAPS
500.0000 mg | ORAL_CAPSULE | Freq: Two times a day (BID) | ORAL | Status: DC
  Administered 2013-12-13: 500 mg via ORAL
  Filled 2013-12-13 (×2): qty 2

## 2013-12-13 MED ORDER — LIDOCAINE HCL (PF) 1 % IJ SOLN
INTRAMUSCULAR | Status: AC
  Filled 2013-12-13: qty 30

## 2013-12-13 MED ORDER — FINASTERIDE 5 MG PO TABS
5.0000 mg | ORAL_TABLET | Freq: Every day | ORAL | Status: DC
  Filled 2013-12-13 (×2): qty 1

## 2013-12-13 MED ORDER — OXYCODONE-ACETAMINOPHEN 5-325 MG PO TABS: 1.0000 | ORAL_TABLET | ORAL | Status: DC | PRN

## 2013-12-13 MED ORDER — GABAPENTIN 300 MG PO CAPS
300.0000 mg | ORAL_CAPSULE | Freq: Three times a day (TID) | ORAL | Status: DC
  Administered 2013-12-13: 300 mg via ORAL
  Filled 2013-12-13 (×2): qty 1

## 2013-12-13 MED ORDER — VERAPAMIL HCL 2.5 MG/ML IV SOLN
INTRAVENOUS | Status: AC
  Filled 2013-12-13: qty 2

## 2013-12-13 MED ORDER — HEPARIN (PORCINE) IN NACL 2-0.9 UNIT/ML-% IJ SOLN
INTRAMUSCULAR | Status: AC
  Filled 2013-12-13: qty 1000

## 2013-12-13 MED ORDER — MORPHINE SULFATE 2 MG/ML IJ SOLN: 2.0000 mg | INTRAMUSCULAR | Status: DC | PRN

## 2013-12-13 MED ORDER — SODIUM CHLORIDE 0.9 % IV SOLN: 1.0000 mL/kg/h | INTRAVENOUS | Status: DC

## 2013-12-13 NOTE — Progress Notes (Signed)
Site area: Left groin Site Prior to Removal:  Level 0 Pressure Applied For:35 minutes Manual:   yes Patient Status During Pull:  Stable Post Pull Site:  Level 0 Post Pull Instructions Given:  yes Post Pull Pulses Present: yes and remained present with doppler with pressure held for sheath pull Dressing Applied:  Pressure dressing with styrofoam wedge Bedrest begins @ 12:00:00 Comments: Puncture site in skin continually oozed after arterial hemostasis was obtained, Dr. Bridgett Larsson in to assess; pressure dressing applied per recommendation of Dr. Bridgett Larsson

## 2013-12-13 NOTE — Telephone Encounter (Addendum)
Message copied by Doristine Section on Thu Dec 13, 2013 12:48 PM ------      Message from: Peter Minium K      Created: Thu Dec 13, 2013 10:46 AM      Regarding: Schedule                   ----- Message -----         From: Conrad Frenchburg, MD         Sent: 12/13/2013  10:30 AM           To: [facility] [address]            Scott Chavez      [id number]      [birth year]            PROCEDURE:      1.  Left common femoral artery cannulation under ultrasound guidance      2.  Second order arterial selection      3.  Right leg runoff      4.  Orbital atherectomy left femoropopliteal artery      5.  Angioplasty left femoropopliteal artery x 2 (3 mm x 200 mm, 4 mm x 200 mm)      6.  Intra-arterial administration of nitroglycerin            Follow-up: 4 weeks       ------ notified patient of post op visit on 01-11-14 at 12:45 with dr. Bridgett Larsson
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
## 2013-12-13 NOTE — Discharge Instructions (Signed)

## 2013-12-13 NOTE — Op Note (Addendum)
OPERATIVE NOTE   PROCEDURE: 1.  Left common femoral artery cannulation under ultrasound guidance 2.  Second order arterial selection 3.  Right leg runoff 4.  Orbital atherectomy right femoropopliteal artery 5.  Angioplasty right femoropopliteal artery x 2 (3 mm x 200 mm, 4 mm x 200 mm) 6.  Intra-arterial administration of nitroglycerin  PRE-OPERATIVE DIAGNOSIS: right leg lifestyle limiting claudication  POST-OPERATIVE DIAGNOSIS: same as above   SURGEON: [name], MD  ANESTHESIA: conscious sedation  ESTIMATED BLOOD LOSS: 50 cc  CONTRAST: 135 cc  FINDING(S): Patent right femoropopliteal artery with multiple stenoses (4 >90%, 1 75-95%): <30% residual stenoses after orbital atherectomy and angioplasty Patent two vessel runoff via peroneal and posterior tibial arteries: unchanged from previous  SPECIMEN(S):  none  INDICATIONS:   Scott Chavez is a 58 y.o. male who presents with lifestyle limiting right leg claudication which interferes with his ability to walk to his college classes on time.  The patient presents for: right leg runoff and possible intervention.  I discussed with the patient the nature of angiographic procedures, especially the limited patencies of any endovascular intervention.  The patient is aware of that the risks of an angiographic procedure include but are not limited to: bleeding, infection, access site complications, renal failure, embolization, rupture of vessel, dissection, possible need for emergent surgical intervention, possible need for surgical procedures to treat the patient's pathology, and stroke and death.  The patient is aware of the risks and agrees to proceed.  DESCRIPTION: After full informed consent was obtained from the patient, the patient was brought back to the angiography suite.  The patient was placed supine upon the angiography table and connected to monitoring equipment.  The patient was then given conscious sedation, the amounts of  which are documented in the patient's chart.  The patient was prepped and drape in the standard fashion for an angiographic procedure.  At this point, attention was turned to the left groin.  Under ultrasound guidance, the left common femoral artery was cannulated with a micropuncture needle proximal to the left femoropopliteal bypass.  The microwire was advanced into the iliac arterial system.  The needle was exchanged for a microsheath, which was loaded into the common femoral artery over the wire.  The microwire was exchanged for a [facility] wire which was advanced into the aorta.  The microsheath was then exchanged for a 5-Fr sheath which was loaded into the common femoral artery.  I placed an Omniflush over the wire and tried to select the right common iliac artery but the geometry made it impossible, so the catheter was exchanged for a Crosser catheter which was able to cross the bifurcation and select the right common iliac artery.  Both the previous common iliac artery stents were in place and crossing the bifurcation did not appear to disturb their position.  I passed the [facility] wire into the right common femoral artery and exchanged the catheter for an end-hole catheter, which was advanced distal to the anatomosis of the kidney transplant in the right external iliac artery.  An automated right leg runoff was completed.  The findings are listed over.  Based on the images, I felt an attempt at orbital atherectomy and angioplasty of the right superficial femoral artery was indicated.  The wire was exchanged for a Rosen wire.  The patient's left femoral sheath was exchanged for a 6-Fr Ansel sheath, which was lodged in the right external iliac artery with great difficulty due to the geometry.  The  dilator was removed.  The patient was given 6000 units of Heparin intravenously, which was a therapeutic bolus.  In total, 8000 units of Heparin was administrated to achieve and maintain a therapeutic level of  anticoagulation.  I then placed the endhole over the wire and exchanged the wire for a [employer] wire.  Using this combination, I advanced into the right superficial femoral artery.  The wire was exchanged for a Spartacore wire which I was able to advance into the mid-segment of the superficial femoral artery before it would not advance.  I exchange the catheter for a Quickcross catheter, and using this combination was able to get into the below-the-knee popliteal artery.  I exchanged the wire for the Parks.  A 4-7 mm Emboshield was placed over the wire in the popliteal artery at the knee level.  I released and deployed the embolic protection device.  I then removed its delivery system.  The 2.0 classic Diamondback Stealth device was loaded over the wire.  Using this device at 60K, 90K, and 140K revolutions, I treated each of the lesions noted above.  I gave a total of 1200 mg of Nitroglycerin, giving 200 mg after every 2-3 treatments with the [facility].  I then removed the device and loaded a 3 mm x 200 mm angioplasty balloons which was inflated at between 8 atm-14 atm at 2 minutes along the entire length of the superficial femoral artery and proximal popliteal artery.  Completion angiogram suggested some residual stenoses >30%, so the balloon was exchanged for a 4 mm x 200 mm angioplasty balloon.  In a similar fashion, the entire superficial femoral artery and proximal popliteal artery was angioplastied at 8 atm-14 atm depending on the needs of the individual segments.  The completion angiogram demonstrated improvement in the entire femoropopliteal segment (<30% residual stenoses) without any dissections, so I felt stopping was indicated as I had concerns that using a 5 mm balloon would cause dissection as the patient was already becoming symptomatic with the 4 mm inflations.  I loaded the retrieval device over the wire and captured the Emboshield.  The device and wire were removed together.  There was evidence  of embolic debris in the filter device.  At this point, I pulled the sheath into the aorta and placed the Amplatz wire into the aorta.  The left sheath was exchanged for a 7-Fr sheath due to some ooze around the sheath.  An end-hole catheter was loaded over the wire and used to recapture the Amplatz tip.  The wire and catheter were removed.  The sheath was aspirated.  No clots were present and the sheath was reloaded with heparinized saline.    COMPLICATIONS: none  CONDITION: stable  [name], MD [facility] [place] Office: [phone] Pager: [phone]  12/13/2013, 10:13 AM

## 2013-12-17 ENCOUNTER — Ambulatory Visit: Payer: PRIVATE HEALTH INSURANCE | Admitting: Internal Medicine

## 2013-12-24 ENCOUNTER — Encounter: Payer: Self-pay | Admitting: Vascular Surgery

## 2014-01-11 ENCOUNTER — Encounter: Payer: PRIVATE HEALTH INSURANCE | Admitting: Vascular Surgery

## 2014-01-16 ENCOUNTER — Encounter: Payer: Self-pay | Admitting: Family

## 2014-01-17 ENCOUNTER — Encounter: Payer: Self-pay | Admitting: Vascular Surgery

## 2014-01-17 ENCOUNTER — Ambulatory Visit (INDEPENDENT_AMBULATORY_CARE_PROVIDER_SITE_OTHER): Payer: PRIVATE HEALTH INSURANCE | Admitting: Vascular Surgery

## 2014-01-17 VITALS — BP 108/81 | HR 86 | Ht 70.0 in | Wt 142.0 lb

## 2014-01-17 DIAGNOSIS — I70219 Atherosclerosis of native arteries of extremities with intermittent claudication, unspecified extremity: Secondary | ICD-10-CM

## 2014-01-17 NOTE — Progress Notes (Signed)
    Postoperative Visit   History of Present Illness  Scott Chavez is a 58 y.o. male who presents for postoperative follow-up from procedure on Date: 12/13/13: OA+PTA R fem-po artery .  The patient notes near resolution of R leg intermittent claudication.  He does not worsening in L leg however.  He continues to have neuropathic sx in L leg.  The patient is able to complete their activities of daily living.  The patient's current symptoms are: L>>R intermittent claudication and neuropathic sx in L leg.  Pt not wear compression stocking on left leg.  + L leg swelling  Past Medical History, Past Surgical History, Social History, Family History, Medications, Allergies, and Review of Systems are unchanged from previous evaluation on 12/13/13  On ROS: improved RLE intermittent claudication, worsening on LLE, +burning sensation L calf.  For VQI Use Only  PRE-ADM LIVING: Home  AMB STATUS: Ambulatory  Physical Examination  Filed Vitals:   01/17/14 1604  BP: 108/81  Pulse: 86  Height: 5\' 10"  (1.778 m)  Weight: 142 lb (64.411 kg)  SpO2: 97%   Body mass index is 20.37 kg/(m^2).  General: A&O x 3, WDWN  Pulmonary: Sym exp, good air movt, CTAB, no rales, rhonchi, & wheezing  Cardiac: RRR, Nl S1, S2, no Murmurs, rubs or gallops  Vascular: Vessel Right Left  Radial Palpable Palpable  Ulnar Palpable Palpable  Brachial Palpable Palpable  Carotid Palpable, without bruit Palpable, without bruit  Aorta Not palpable N/A  Femoral Palpable Palpable  Popliteal Not palpable Not palpable  PT Faintly Palpable Faintly Palpable  DP Not Palpable Not Palpable    Gastrointestinal: soft, NTND, -G/R, - HSM, - masses, - CVAT B  Musculoskeletal: M/S 5/5 throughout , Extremities without ischemic changes , Left  groin without hematoma, no echymosis present at cannulation site, 1+ edema Left leg  Neurologic:  Pain and light touch intact in extremities , Motor exam as listed above  Medical Decision  Making  Scott Chavez is a 58 y.o. male who presents s/p OA+PTA R fem-pop artery  Based on his angiographic findings, this patient needs: ABI and LLE arterial duplex.  He will follow up in 2-4 weeks with those studies. I discussed in depth with the patient the nature of atherosclerosis, and emphasized the importance of maximal medical management including strict control of blood pressure, blood glucose, and lipid levels, obtaining regular exercise, and cessation of smoking.  The patient is aware that without maximal medical management the underlying atherosclerotic disease process will progress, limiting the benefit of any interventions. The patient is currently on a statin: Crestor. The patient is currently on an anti-platelet: Plavix  Thank you for allowing [place] to participate in this patient's care.  [name], MD [facility] [place] Office: [phone] Pager: [phone]

## 2014-01-18 NOTE — Addendum Note (Signed)
Addended by: Mena Goes on: 01/18/2014 09:26 AM   Modules accepted: Orders

## 2014-01-29 ENCOUNTER — Ambulatory Visit (INDEPENDENT_AMBULATORY_CARE_PROVIDER_SITE_OTHER): Payer: PRIVATE HEALTH INSURANCE | Admitting: Internal Medicine

## 2014-01-29 ENCOUNTER — Encounter: Payer: Self-pay | Admitting: Internal Medicine

## 2014-01-29 VITALS — BP 120/80 | HR 56 | Ht 68.25 in | Wt 147.0 lb

## 2014-01-29 DIAGNOSIS — K648 Other hemorrhoids: Secondary | ICD-10-CM | POA: Insufficient documentation

## 2014-01-29 MED ORDER — HYDROCORTISONE 1 % RE CREA: 1.0000 "application " | TOPICAL_CREAM | Freq: Two times a day (BID) | RECTAL | Status: DC
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
## 2014-01-29 NOTE — Patient Instructions (Signed)
We have sent the following medications to your pharmacy for you to pick up at your convenience: Hydrocortisone cream  Follow up with [place] in 2 months.  Please call us back and make this appointment--[phone].  Take benefiber daily, 1 tablespoon , handout provided.   I appreciate the opportunity to care for you.

## 2014-01-29 NOTE — Assessment & Plan Note (Signed)
Anoscopy shows the history is consistent as well. He had him on a colonoscopy in 2010 and was otherwise unremarkable. I don't think he needs another colonoscopy based upon the signs and symptoms and findings. He is on clopidogrel which makes banding problematic though not impossible. I would not hold that now.  He says his defecation habits are okay but I recommended Benefiber 1 tablespoon daily if he is having any trouble straining to stool. Hydrocortisone cream treatment is recommended on a short-term basis and I will see him back in 2 months to ensure the response to treatment.

## 2014-02-07 ENCOUNTER — Encounter: Payer: Self-pay | Admitting: Vascular Surgery

## 2014-02-08 ENCOUNTER — Other Ambulatory Visit (HOSPITAL_COMMUNITY): Payer: PRIVATE HEALTH INSURANCE

## 2014-02-08 ENCOUNTER — Ambulatory Visit: Payer: PRIVATE HEALTH INSURANCE | Admitting: Vascular Surgery

## 2014-02-08 ENCOUNTER — Encounter (HOSPITAL_COMMUNITY): Payer: PRIVATE HEALTH INSURANCE

## 2014-03-14 ENCOUNTER — Encounter: Payer: Self-pay | Admitting: Vascular Surgery

## 2014-03-15 ENCOUNTER — Encounter: Payer: Self-pay | Admitting: Vascular Surgery

## 2014-03-15 ENCOUNTER — Ambulatory Visit (INDEPENDENT_AMBULATORY_CARE_PROVIDER_SITE_OTHER)
Admission: RE | Admit: 2014-03-15 | Discharge: 2014-03-15 | Disposition: A | Discharge status: Home / Self Care | Payer: PRIVATE HEALTH INSURANCE | Source: Ambulatory Visit | Attending: Vascular Surgery | Admitting: Vascular Surgery

## 2014-03-15 ENCOUNTER — Ambulatory Visit (INDEPENDENT_AMBULATORY_CARE_PROVIDER_SITE_OTHER): Payer: PRIVATE HEALTH INSURANCE | Admitting: Vascular Surgery

## 2014-03-15 ENCOUNTER — Ambulatory Visit (HOSPITAL_COMMUNITY)
Admission: RE | Admit: 2014-03-15 | Discharge: 2014-03-15 | Disposition: A | Discharge status: Home / Self Care | Payer: PRIVATE HEALTH INSURANCE | Source: Ambulatory Visit | Attending: Vascular Surgery | Admitting: Vascular Surgery

## 2014-03-15 VITALS — BP 163/97 | HR 58 | Ht 70.0 in | Wt 143.0 lb

## 2014-03-15 DIAGNOSIS — I70219 Atherosclerosis of native arteries of extremities with intermittent claudication, unspecified extremity: Secondary | ICD-10-CM

## 2014-03-15 NOTE — Progress Notes (Signed)
    Office Progress Note  History of Present Illness  Scott Chavez is a 58 y.o. (August 16, 1955) male who presents for ABI and LLE arterial duplex studies s/p OA+PTA R fem-pop artery on 12/13/12. He was last seen on 01/17/13 where he noted near resolution of his right leg intermittent claudication. Today, he describes continued improvement with his right leg claudication. He still has cramping but is able to "push through it" better. However, he continues to have medial left leg numbness and ankle swelling.     The patient's PMH, PSH, SH, FamHx, Med, and Allergies are unchanged from 01/17/14.   On ROS today: he denies rest pain. Improved right leg claudication. Denies non-healing wound of the lower extremities. Weakness in legs.   Physical Examination  Filed Vitals:   03/15/14 1503  BP: 163/97  Pulse: 58  Height: 5\' 10"  (1.778 m)  Weight: 143 lb (64.864 kg)  SpO2: 100%   Body mass index is 20.52 kg/(m^2).  Physical Examination  Filed Vitals:   03/15/14 1503  BP: 163/97  Pulse: 58  Height: 5\' 10"  (1.778 m)  Weight: 143 lb (64.864 kg)  SpO2: 100%   Body mass index is 20.52 kg/(m^2).  General: A&O x 3, WDWN male in NAD  Pulmonary: Sym exp, good air movt, CTAB, no rales, rhonchi, & wheezing   Cardiac: RRR, Nl S1, S2, no Murmurs, rubs or gallops  Vascular: Vessel Right Left  Radial Palpable Palpable  Brachial Palpable Palpable  Carotid Palpable, without bruit Palpable, without bruit  Aorta Not palpable N/A  Femoral Palpable Palpable  Popliteal Not palpable Not palpable  PT Not Palpable Not Palpable  DP Not Palpable Not Palpable   Musculoskeletal: M/S 5/5 throughout. Extremities without ischemic changes. Swelling of left medial ankle.   Neurologic: Pain and light touch intact in extremities, Motor exam as listed above  Non-Invasive Vascular Imaging ABI (Date: 03/15/2014) R: 0.91 (previously 0.69), DP: biphasic, PT: monophasic, TBI: 0.78 L: 1.25 (previously 0.86), DP:  biphasic, PT: biphasic, TBI: 1.16  Lower Extremity Arterial Duplex (Date: 03/15/2014) Patent left fem-below knee popliteal bypass  Medical Decision Making  Scott Chavez is a 58 y.o. male who presents s/p OA+PTA R fem-pop artery, L CFA to BK pop BPG with ips NR GSV, PTA+S B CIA  Based on the patient's vascular studies and examination, the patient will follow up in 6 months with BLE ABI, bilateral arterial duplex and aortoiliac duplex.  His right leg claudication has continued to improve and his ABIs have shown good improvement.   Discussed the limited patency rate on his right leg bypass and possible need for future intervention with drug coated balloon angioplasty.   He is on a dual antiplatelet therapy with plavix and ASA. He is on a statin.   The patient is aware that without maximal medical management the underlying atherosclerotic disease process will progress, limiting the benefit of any interventions.  I discussed in depth with the patient the need for long term surveillance to improve the primary assisted patency of his bypass.  The patient agrees to cooperate with such.  Thank you for allowing [place] to participate in this patient's care.  Virgina Jock, PA-C [facility] Office: [phone] Pager: [phone]  03/15/2014, 3:39 PM  This patient was seen in conjunction with Dr. Bridgett Larsson.

## 2014-03-18 NOTE — Addendum Note (Signed)
Addended by: Mena Goes on: 03/18/2014 10:26 AM   Modules accepted: Orders

## 2014-04-01 ENCOUNTER — Other Ambulatory Visit: Payer: Self-pay | Admitting: *Deleted

## 2014-04-01 DIAGNOSIS — I739 Peripheral vascular disease, unspecified: Secondary | ICD-10-CM

## 2014-04-01 MED ORDER — GABAPENTIN 300 MG PO CAPS: 300.0000 mg | ORAL_CAPSULE | Freq: Three times a day (TID) | ORAL | Status: DC

## 2014-04-11 ENCOUNTER — Encounter (HOSPITAL_COMMUNITY): Payer: Self-pay | Admitting: Vascular Surgery

## 2014-05-16 ENCOUNTER — Encounter (HOSPITAL_COMMUNITY): Payer: Self-pay | Admitting: Vascular Surgery

## 2014-08-28 ENCOUNTER — Other Ambulatory Visit: Payer: Self-pay | Admitting: *Deleted

## 2014-08-28 DIAGNOSIS — I739 Peripheral vascular disease, unspecified: Secondary | ICD-10-CM

## 2014-08-28 MED ORDER — GABAPENTIN 300 MG PO CAPS: 300.0000 mg | ORAL_CAPSULE | Freq: Three times a day (TID) | ORAL | Status: DC

## 2014-09-12 ENCOUNTER — Encounter: Payer: Self-pay | Admitting: Vascular Surgery

## 2014-09-13 ENCOUNTER — Ambulatory Visit (INDEPENDENT_AMBULATORY_CARE_PROVIDER_SITE_OTHER)
Admission: RE | Admit: 2014-09-13 | Discharge: 2014-09-13 | Disposition: A | Discharge status: Home / Self Care | Payer: Medicare Other | Source: Ambulatory Visit | Attending: Vascular Surgery | Admitting: Vascular Surgery

## 2014-09-13 ENCOUNTER — Encounter: Payer: Self-pay | Admitting: Vascular Surgery

## 2014-09-13 ENCOUNTER — Other Ambulatory Visit: Payer: Self-pay | Admitting: *Deleted

## 2014-09-13 ENCOUNTER — Ambulatory Visit (INDEPENDENT_AMBULATORY_CARE_PROVIDER_SITE_OTHER): Payer: Medicare Other | Admitting: Vascular Surgery

## 2014-09-13 ENCOUNTER — Ambulatory Visit (HOSPITAL_COMMUNITY)
Admission: RE | Admit: 2014-09-13 | Discharge: 2014-09-13 | Disposition: A | Discharge status: Home / Self Care | Payer: Medicare Other | Source: Ambulatory Visit | Attending: Vascular Surgery | Admitting: Vascular Surgery

## 2014-09-13 VITALS — BP 144/84 | HR 60 | Resp 16 | Ht 70.0 in | Wt 144.0 lb

## 2014-09-13 DIAGNOSIS — I739 Peripheral vascular disease, unspecified: Secondary | ICD-10-CM | POA: Diagnosis not present

## 2014-09-13 DIAGNOSIS — I70219 Atherosclerosis of native arteries of extremities with intermittent claudication, unspecified extremity: Secondary | ICD-10-CM

## 2014-09-13 DIAGNOSIS — M25569 Pain in unspecified knee: Secondary | ICD-10-CM

## 2014-09-13 MED ORDER — GABAPENTIN 300 MG PO CAPS: 300.0000 mg | ORAL_CAPSULE | Freq: Three times a day (TID) | ORAL | Status: DC

## 2014-09-13 NOTE — Progress Notes (Deleted)
Subjective:     Patient ID: Scott Chavez, male   DOB: 29-Dec-1955, 59 y.o.   MRN: [id number]  HPI   Review of Systems     Objective:   Physical Exam     Assessment:     ***    Plan:     ***

## 2014-09-13 NOTE — Progress Notes (Deleted)
Patient ID: Scott Chavez, male   DOB: [birth year], 59 y.o.   MRN: [id number]  Reason for Consult: Re-evaluation   Referred by Corliss Parish, MD  Subjective:     HPI:  Scott Chavez is a 59 y.o. male ***  Past Medical History  Diagnosis Date  . History of renal failure   . Hemodialysis patient     "on dialysis 1998-2010" 04/25/2013)  . Hypertension   . Hypercholesteremia   . Bladder dysfunction   . Peripheral vascular disease   . Aorto-iliac disease   . ESRD (end stage renal disease)     transplant- 12/[facility] , followed by Dr. Moshe Cipro   . GERD (gastroesophageal reflux disease)   . Anemia     pt. not sure, but reports he is taking Fe for one month now.  . Self-catheterizes urinary bladder     pt. choice, but he reports that he desires to do this to avoid retention   . Internal hemorrhoids     Colonoscopy 2010 and anoscopy 2015   Family History  Problem Relation Age of Onset  . Hypertension Father   . Other Father     amputation  . Hypertension Mother    Past Surgical History  Procedure Laterality Date  . Av fistula placement Left 1998    "removed 2011" (04/25/2013)  . Nephrectomy recipient  2010  . S/p cadaveric renal transplant  December 2010    [place] bypass graft Left 04/24/2013    Procedure: BYPASS GRAFT COMMON FEMORALARTERY-BELOW KNEE POPLITEAL ARTERY WITH GREATER SAPHENOUS VEIN;  Surgeon: Conrad McKenna, MD;  Location: [place];  Service: Vascular;  Laterality: Left;  . Intraoperative arteriogram Left 04/24/2013    Procedure: INTRA OPERATIVE ARTERIOGRAM;  Surgeon: Conrad McGregor, MD;  Location: [place];  Service: Vascular;  Laterality: Left;  . Av fistula repair Left     removal with partial vein removed  . Colonoscopy    . Abdominal aortagram N/A 03/09/2013    Procedure: ABDOMINAL Maxcine Ham;  Surgeon: Elam Dutch, MD;  Location: [facility] C[facility] LAB;  Service: Cardiovascular;  Laterality: N/A;  . Lower extremity  angiogram Bilateral 03/09/2013    Procedure: LOWER EXTREMITY ANGIOGRAM;  Surgeon: Elam Dutch, MD;  Location: [facility] CATH LAB;  Service: Cardiovascular;  Laterality: Bilateral;  . Lower extremity angiogram Right 10/25/2013    Procedure: LOWER EXTREMITY ANGIOGRAM;  Surgeon: Conrad Spotswood, MD;  Location: [facility] CATH LAB;  Service: Cardiovascular;  Laterality: Right;  . Lower extremity angiogram Right 12/13/2013    Procedure: LOWER EXTREMITY ANGIOGRAM;  Surgeon: Conrad Nappanee, MD;  Location: [facility] CATH LAB;  Service: Cardiovascular;  Laterality: Right;    Short Social History:  History  Substance Use Topics  . Smoking status: Light Tobacco Smoker -- 0.50 packs/day for 34 years    Types: E-cigarettes    Last Attempt to Quit: 02/17/2003  . Smokeless tobacco: Never Used     Comment: vapor cigrarette  . Alcohol Use: No    Allergies  Allergen Reactions  . Tape Rash    Silk tape    Current Outpatient Prescriptions  Medication Sig Dispense Refill  . aspirin 325 MG tablet Take 325 mg by mouth 2 (two) times daily.     Marland Kitchen b complex vitamins tablet Take 0.5 tablets by mouth daily.    . ciprofloxacin (CIPRO) 500 MG tablet 3 (three) times daily.    . finasteride (PROSCAR) 5 MG tablet Take 5 mg  by mouth daily before breakfast.     . gabapentin (NEURONTIN) 300 MG capsule Take 1 capsule (300 mg total) by mouth 3 (three) times daily. 30 capsule 11  . magnesium oxide (MAG-OX) 400 MG tablet Take 400 mg by mouth 2 (two) times daily.     . mycophenolate (CELLCEPT) 250 MG capsule Take 500 mg by mouth 2 (two) times daily.     Marland Kitchen NIFEdipine (PROCARDIA-XL/ADALAT-CC/NIFEDICAL-XL) 30 MG 24 hr tablet Take 30 mg by mouth daily.     Marland Kitchen omeprazole (PRILOSEC) 20 MG capsule Take 20 mg by mouth 2 (two) times daily before a meal.    . rosuvastatin (CRESTOR) 5 MG tablet Take 5 mg by mouth daily before breakfast.     . tacrolimus (PROGRAF) 1 MG capsule Take 3 mg by mouth 2 (two) times daily.    . tadalafil (CIALIS) 20 MG tablet  Take 20 mg by mouth daily as needed for erectile dysfunction.    . tamsulosin (FLOMAX) 0.4 MG CAPS capsule Take 0.4 mg by mouth daily.    . vitamin B-12 (CYANOCOBALAMIN) 500 MCG tablet Take 500 mcg by mouth daily.    . vitamin C (ASCORBIC ACID) 500 MG tablet Take 500 mg by mouth daily.    Marland Kitchen zolpidem (AMBIEN) 10 MG tablet Take 10 mg by mouth at bedtime as needed for sleep.    . clopidogrel (PLAVIX) 75 MG tablet Take 1 tablet (75 mg total) by mouth daily. (Patient not taking: Reported on 09/13/2014) 30 tablet 11  . hydrocortisone (PROCTOCORT) 1 % CREA Apply 1 application topically 2 (two) times daily. (Patient not taking: Reported on 09/13/2014) 28.4 g 1   No current facility-administered medications for this visit.    REVIEW OF SYSTEMS      Objective:  Objective  Filed Vitals:   09/13/14 1009  BP: 144/84  Pulse: 60  Resp: 16  Height: 5\' 10"  (1.778 m)  Weight: 144 lb (65.318 kg)  SpO2: 100%   Body mass index is 20.66 kg/(m^2).  Physical Exam  Data: ***     Assessment/Plan:     ***     Conrad [place] MD [facility]

## 2014-09-13 NOTE — Progress Notes (Addendum)
Postoperative Visit   History of Present Illness  Scott Chavez is a 59 y.o. male  who presents for routine follow-up with complaint of mild right calf claudication.    Procedures in past include:  12/13/13: OA+PTA R fem-po artery .   10/25/13: PTA+S R CIA, L CIA  04/24/13: Left iliofemoral endarterectomy with bovine patch angioplasty, Left common femoral artery to below-the-knee popliteal artery bypass with non-reverse ipsilateral greater saphenous vein   Pt intermittent gets R calf cramping with he has to maximally exert.  He has not intermittent claudication in left leg though he continues to have neuropathic sx in L leg. These sx are now down to intermittent episodes of neuropathic pain.  The patient is able to complete their activities of daily living. Pt also notes intermittent left leg swelling.  Past Medical History, Past Surgical History, Social History, Family History, Medications, Allergies, and Review of Systems are unchanged from previous evaluation on 01/17/14  On ROS: improved RLE intermittent claudication, neuropathic pain L leg  For VQI Use Only  PRE-ADM LIVING: Home  AMB STATUS: Ambulatory  Physical Examination  Filed Vitals:   09/13/14 1009  BP: 144/84  Pulse: 60  Resp: 16  Height: 5\' 10"  (1.778 m)  Weight: 144 lb (65.318 kg)  SpO2: 100%   Body mass index is 20.66 kg/(m^2).  General: A&O x 3, WDWN  Pulmonary: Sym exp, good air movt, CTAB, no rales, rhonchi, & wheezing  Cardiac: RRR, Nl S1, S2, no Murmurs, rubs or gallops  Vascular: Vessel Right Left  Radial Palpable Palpable  Ulnar Palpable Palpable  Brachial Palpable Palpable  Carotid Palpable, without bruit Palpable, without bruit  Aorta Not palpable N/A  Femoral Palpable Palpable  Popliteal Not palpable Not palpable  PT Faintly Palpable Faintly Palpable  DP Not Palpable Not Palpable   Gastrointestinal: soft, NTND, -G/R, - HSM, - masses, - CVAT  B  Musculoskeletal: M/S 5/5 throughout , Extremities without ischemic changes ,mild edema Left leg, no wounds, all incisions well healed  Neurologic: Pain and light touch intact in extremities , Motor exam as listed above  ABI (Date: 09/13/2014)  R: 0.84 (0.91), DP: mono, PT: mono, TBI: 0.65  L: 1.15 (1.25), DP: bi, PT: bi, TBI: 0.75  Aortoiliac duplex (09/13/2014)  R: patent iliac system with patent stent (tri, bi)  L: patent iliac system with patent stent (tri, bi)  BLE Arterial duplex (09/13/2014)  R: heavy calcification no hemodynamically significant stenosis  L: widely patent L fem-pop BPG   Medical Decision Making  Scott Chavez is a 59 y.o. male  who presents s/p OA+PTA R fem-pop artery, PTA+S B CIA, L iliofem EA w/ BPA with L CFA to BK pop BPG w/ ips NR GSV   Based on his angiographic findings, this patient needs: ABI, Aortoiliac duplex, and BLE arterial duplex q6 months  I discussed in depth with the patient the nature of atherosclerosis, and emphasized the importance of maximal medical management including strict control of blood pressure, blood glucose, and lipid levels, obtaining regular exercise, and cessation of smoking. The patient is aware that without maximal medical management the underlying atherosclerotic disease process will progress, limiting the benefit of any interventions.  The patient is currently on a statin: Crestor.  The patient is currently on an anti-platelet: Plavix  Thank you for allowing [place] to participate in this patient's care.   [name], MD [facility] [place] Office: [phone] Pager: [phone]  09/13/2014, 10:46 AM  VASCULAR QUALITY INITIATIVE FOLLOW UP DATA:  Current smoker: [  ] yes  [ x ] no  Living status: [ x ]  Home  [  ] Nursing home  [  ] Homeless    MEDS:  ASA [  ] yes  [ x ] no- [  ] medical reason  [  ] non compliant  STATIN  [x  ] yes  [  ] no- [  ] medical reason  [  ]  non compliant  Beta blocker [  ] yes  [x  ] no- [  ] medical reason  [  ] non compliant  ACE inhibitor [  ] yes  [x  ] no- [  ] medical reason  [  ] non compliant  P2Y12 Antagonist [  ] none  [ x ] clopidogrel-Plavix  [  ] ticlopidine-Ticlid   [  ] prasugrel-Effient  [  ] ticagrelor- Brilinta    Anticoagulant [ x ] None  [  ] warfarin  [  ] rivaroxaban-Xarelto [  ] dabigatran- Pradaxa  Ambulation: [ x ] Amb  [  ] Amb with assistance  [  ] wheelchair  [  ] bedridden  Ipsilateral Sx: [ x ] none   [  ] claudication  [  ] rest pain  [  ] tissue loss  Current Patency: [x ] primary  [  ] primary-assisted  [  ] secondary  [  ] occluded  Patency judged by: [ x ] doppler  [  ] palp graft pulse  [ x ] palp distal pulse   [ x ] ABI increase > 15%   [ x ] Duplex  If occluded, when-   Ipsilateral ABI: 1.15  Ipsilateral TBI: 0.75  Infection: [x  ] none  [  ] cellulitis  [  ] deep abscess  [  ] infection of artery or graft  Bypass revision: [ x ] no  [  ] yes- [  ] surg  [  ] catheter based  [  ] both    Date:   Thrombectomy/ lysis/ revision:  [ x ] no  [  ] yes- [  ] surg  [  ] catheter based  [  ] both    Date:   Major amputation: [x]  no  [  ] minor amp  [  ] BKA  [  ] AKA   Date:

## 2014-09-16 ENCOUNTER — Other Ambulatory Visit: Payer: Self-pay | Admitting: *Deleted

## 2014-09-16 DIAGNOSIS — I739 Peripheral vascular disease, unspecified: Secondary | ICD-10-CM

## 2014-09-17 ENCOUNTER — Other Ambulatory Visit: Payer: Self-pay | Admitting: *Deleted

## 2014-09-17 DIAGNOSIS — I739 Peripheral vascular disease, unspecified: Secondary | ICD-10-CM

## 2014-10-01 ENCOUNTER — Encounter (HOSPITAL_COMMUNITY): Payer: Self-pay | Admitting: Emergency Medicine

## 2014-10-01 DIAGNOSIS — Z862 Personal history of diseases of the blood and blood-forming organs and certain disorders involving the immune mechanism: Secondary | ICD-10-CM | POA: Diagnosis not present

## 2014-10-01 DIAGNOSIS — R197 Diarrhea, unspecified: Secondary | ICD-10-CM | POA: Diagnosis not present

## 2014-10-01 DIAGNOSIS — E780 Pure hypercholesterolemia: Secondary | ICD-10-CM | POA: Diagnosis not present

## 2014-10-01 DIAGNOSIS — Z79899 Other long term (current) drug therapy: Secondary | ICD-10-CM | POA: Insufficient documentation

## 2014-10-01 DIAGNOSIS — Z7982 Long term (current) use of aspirin: Secondary | ICD-10-CM | POA: Diagnosis not present

## 2014-10-01 DIAGNOSIS — I120 Hypertensive chronic kidney disease with stage 5 chronic kidney disease or end stage renal disease: Secondary | ICD-10-CM | POA: Diagnosis not present

## 2014-10-01 DIAGNOSIS — N186 End stage renal disease: Secondary | ICD-10-CM | POA: Insufficient documentation

## 2014-10-01 DIAGNOSIS — K219 Gastro-esophageal reflux disease without esophagitis: Secondary | ICD-10-CM | POA: Diagnosis not present

## 2014-10-01 DIAGNOSIS — Z992 Dependence on renal dialysis: Secondary | ICD-10-CM | POA: Diagnosis not present

## 2014-10-01 DIAGNOSIS — J159 Unspecified bacterial pneumonia: Secondary | ICD-10-CM | POA: Diagnosis not present

## 2014-10-01 DIAGNOSIS — Z720 Tobacco use: Secondary | ICD-10-CM | POA: Insufficient documentation

## 2014-10-01 DIAGNOSIS — Z87448 Personal history of other diseases of urinary system: Secondary | ICD-10-CM | POA: Diagnosis not present

## 2014-10-01 DIAGNOSIS — R509 Fever, unspecified: Secondary | ICD-10-CM | POA: Diagnosis present

## 2014-10-01 DIAGNOSIS — J069 Acute upper respiratory infection, unspecified: Secondary | ICD-10-CM | POA: Insufficient documentation

## 2014-10-01 LAB — COMPREHENSIVE METABOLIC PANEL
ALBUMIN: 3.4 g/dL — AB (ref 3.5–5.0)
ALT: 16 U/L — ABNORMAL LOW (ref 17–63)
ANION GAP: 12 (ref 5–15)
AST: 22 U/L (ref 15–41)
Alkaline Phosphatase: 56 U/L (ref 38–126)
BUN: 23 mg/dL — AB (ref 6–20)
CALCIUM: 9.5 mg/dL (ref 8.9–10.3)
CO2: 20 mmol/L — ABNORMAL LOW (ref 22–32)
Chloride: 102 mmol/L (ref 101–111)
Creatinine, Ser: 1.44 mg/dL — ABNORMAL HIGH (ref 0.61–1.24)
GFR calc non Af Amer: 52 mL/min — ABNORMAL LOW (ref >60)
Glucose, Bld: 128 mg/dL — ABNORMAL HIGH (ref 65–99)
Potassium: 3.6 mmol/L (ref 3.5–5.1)
Sodium: 134 mmol/L — ABNORMAL LOW (ref 135–145)
TOTAL PROTEIN: 6.8 g/dL (ref 6.5–8.1)
Total Bilirubin: 1.2 mg/dL (ref 0.3–1.2)

## 2014-10-01 LAB — URINE MICROSCOPIC-ADD ON

## 2014-10-01 LAB — CBC WITH DIFFERENTIAL/PLATELET
BASOS PCT: 0 % (ref 0–1)
Basophils Absolute: 0 10*3/uL (ref 0.0–0.1)
EOS ABS: 0.3 10*3/uL (ref 0.0–0.7)
EOS PCT: 3 % (ref 0–5)
HEMATOCRIT: 40.9 % (ref 39.0–52.0)
Hemoglobin: 14.1 g/dL (ref 13.0–17.0)
Lymphocytes Relative: 10 % — ABNORMAL LOW (ref 12–46)
Lymphs Abs: 0.9 10*3/uL (ref 0.7–4.0)
MCH: 30.5 pg (ref 26.0–34.0)
MCHC: 34.5 g/dL (ref 30.0–36.0)
MCV: 88.3 fL (ref 78.0–100.0)
MONOS PCT: 12 % (ref 3–12)
Monocytes Absolute: 1.1 10*3/uL — ABNORMAL HIGH (ref 0.1–1.0)
Neutro Abs: 6.8 10*3/uL (ref 1.7–7.7)
Neutrophils Relative %: 75 % (ref 43–77)
Platelets: 180 10*3/uL (ref 150–400)
RBC: 4.63 MIL/uL (ref 4.22–5.81)
RDW: 12.5 % (ref 11.5–15.5)
WBC: 9.1 10*3/uL (ref 4.0–10.5)

## 2014-10-01 LAB — URINALYSIS, ROUTINE W REFLEX MICROSCOPIC
Glucose, UA: NEGATIVE mg/dL
Hgb urine dipstick: NEGATIVE
Ketones, ur: 15 mg/dL — AB
Nitrite: NEGATIVE
Protein, ur: NEGATIVE mg/dL
Specific Gravity, Urine: 1.024 (ref 1.005–1.030)
UROBILINOGEN UA: 1 mg/dL (ref 0.0–1.0)
pH: 5.5 (ref 5.0–8.0)

## 2014-10-01 LAB — LIPASE, BLOOD: LIPASE: 19 U/L — AB (ref 22–51)

## 2014-10-01 MED ORDER — ACETAMINOPHEN 325 MG PO TABS
650.0000 mg | ORAL_TABLET | Freq: Once | ORAL | Status: AC
  Administered 2014-10-01: 650 mg via ORAL

## 2014-10-01 MED ORDER — ACETAMINOPHEN 325 MG PO TABS
ORAL_TABLET | ORAL | Status: AC
  Filled 2014-10-01: qty 2

## 2014-10-01 NOTE — ED Notes (Signed)
Pt. reports hypogastric pain/discomfort with fever onset this week , denies emesis or diarrhea .

## 2014-10-02 ENCOUNTER — Emergency Department (HOSPITAL_COMMUNITY): Payer: Medicare Other

## 2014-10-02 ENCOUNTER — Emergency Department (HOSPITAL_COMMUNITY)
Admission: EM | Admit: 2014-10-02 | Discharge: 2014-10-02 | Disposition: A | Discharge status: Home / Self Care | Payer: Medicare Other | Attending: Emergency Medicine | Admitting: Emergency Medicine

## 2014-10-02 DIAGNOSIS — R05 Cough: Secondary | ICD-10-CM

## 2014-10-02 DIAGNOSIS — R059 Cough, unspecified: Secondary | ICD-10-CM

## 2014-10-02 DIAGNOSIS — J069 Acute upper respiratory infection, unspecified: Secondary | ICD-10-CM | POA: Diagnosis not present

## 2014-10-02 DIAGNOSIS — J189 Pneumonia, unspecified organism: Secondary | ICD-10-CM

## 2014-10-02 DIAGNOSIS — N390 Urinary tract infection, site not specified: Secondary | ICD-10-CM

## 2014-10-02 MED ORDER — LEVOFLOXACIN 750 MG PO TABS: 750.0000 mg | ORAL_TABLET | Freq: Every day | ORAL | Status: DC

## 2014-10-02 MED ORDER — ACETAMINOPHEN 325 MG PO TABS
650.0000 mg | ORAL_TABLET | Freq: Once | ORAL | Status: AC
  Administered 2014-10-02: 650 mg via ORAL
  Filled 2014-10-02: qty 2

## 2014-10-02 MED ORDER — DEXTROSE 5 % IV SOLN
1.0000 g | Freq: Once | INTRAVENOUS | Status: AC
  Administered 2014-10-02: 1 g via INTRAVENOUS
  Filled 2014-10-02: qty 10

## 2014-10-02 MED ORDER — SODIUM CHLORIDE 0.9 % IV BOLUS (SEPSIS)
500.0000 mL | Freq: Once | INTRAVENOUS | Status: AC
  Administered 2014-10-02: 500 mL via INTRAVENOUS

## 2014-10-02 MED ORDER — LEVOFLOXACIN 750 MG PO TABS
750.0000 mg | ORAL_TABLET | Freq: Once | ORAL | Status: AC
  Administered 2014-10-02: 750 mg via ORAL
  Filled 2014-10-02: qty 1

## 2014-10-02 NOTE — ED Notes (Signed)
Pt stable, ambulatory, states understanding of discharge instructions 

## 2014-10-02 NOTE — ED Provider Notes (Signed)
CSN: [id number]     Arrival date & time 10/01/14  2013 History  This chart was scribed for Pamella Pert, MD by Meriel Pica, ED Scribe. This patient was seen in room A04C/A04C and the patient's care was started 1:15 AM.   Chief Complaint  Patient presents with  . Fever    Patient is a 59 y.o. male presenting with fever. No language interpreter was used.  Fever Max temp prior to arrival:  102  Temp source:  Oral Severity:  Moderate Onset quality:  Gradual Duration:  5 days Timing:  Constant Progression:  Worsening Chronicity:  New Relieved by:  Nothing Associated symptoms: chills, cough, diarrhea, headaches and nausea   Associated symptoms: no chest pain, no confusion, no congestion, no dysuria, no rash, no rhinorrhea and no vomiting     HPI Comments: Scott Chavez is a 59 y.o. male, with a PMhx of a kidney transplant, who presents to the Emergency Department complaining of a constant, moderate fever that has been going on for the 5 days. Pt reports he has had a fever of 101.5 F onset 5 days ago that has worsened to 102.7 F today with associated diarrhea, headache, productive cough, bloating, and chills.  He currently has a fever of 102.1 F. Pt notes that his sputum is thick but he has not noticed if there is a color to it. He is able to void urine normally, but reports that when he has been drinking a lot of water or coffee he uses a catheter. When he cannot use a catheter he notes that the bloating is worse. The pt is concerned of a subjective UTI. Pt denies dysuria, vomiting, malodorous urine, CP, SOB, or blood in stool.   Past Medical History  Diagnosis Date  . History of renal failure   . Hemodialysis patient     "on dialysis 1998-2010" 04/25/2013)  . Hypertension   . Hypercholesteremia   . Bladder dysfunction   . Peripheral vascular disease   . Aorto-iliac disease   . ESRD (end stage renal disease)     transplant- [facility] , followed by Dr. Moshe Cipro    . GERD (gastroesophageal reflux disease)   . Anemia     pt. not sure, but reports he is taking Fe for one month now.  . Self-catheterizes urinary bladder     pt. choice, but he reports that he desires to do this to avoid retention   . Internal hemorrhoids     Colonoscopy 2010 and anoscopy 2015   Past Surgical History  Procedure Laterality Date  . Av fistula placement Left 1998    "removed 2011" (04/25/2013)  . Nephrectomy recipient  2010  . S/p cadaveric renal transplant  December 2010    [place] bypass graft Left 04/24/2013    Procedure: BYPASS GRAFT COMMON FEMORALARTERY-BELOW KNEE POPLITEAL ARTERY WITH GREATER SAPHENOUS VEIN;  Surgeon: Conrad Belleville, MD;  Location: [place];  Service: Vascular;  Laterality: Left;  . Intraoperative arteriogram Left 04/24/2013    Procedure: INTRA OPERATIVE ARTERIOGRAM;  Surgeon: Conrad Union, MD;  Location: [place];  Service: Vascular;  Laterality: Left;  . Av fistula repair Left     removal with partial vein removed  . Colonoscopy    . Abdominal aortagram N/A 03/09/2013    Procedure: ABDOMINAL Maxcine Ham;  Surgeon: Elam Dutch, MD;  Location: [facility] CATH LAB;  Service: Cardiovascular;  Laterality: N/A;  . Lower extremity angiogram Bilateral 03/09/2013  Procedure: LOWER EXTREMITY ANGIOGRAM;  Surgeon: Elam Dutch, MD;  Location: [facility] CATH LAB;  Service: Cardiovascular;  Laterality: Bilateral;  . Lower extremity angiogram Right 10/25/2013    Procedure: LOWER EXTREMITY ANGIOGRAM;  Surgeon: Conrad [place], MD;  Location: [facility] CATH LAB;  Service: Cardiovascular;  Laterality: Right;  . Lower extremity angiogram Right 12/13/2013    Procedure: LOWER EXTREMITY ANGIOGRAM;  Surgeon: Conrad Ansted, MD;  Location: [facility] CATH LAB;  Service: Cardiovascular;  Laterality: Right;   Family History  Problem Relation Age of Onset  . Hypertension Father   . Other Father     amputation  . Hypertension Mother    History  Substance Use Topics  . Smoking  status: Light Tobacco Smoker -- 0.50 packs/day for 34 years    Types: E-cigarettes    Last Attempt to Quit: 02/17/2003  . Smokeless tobacco: Never Used     Comment: vapor cigrarette  . Alcohol Use: No    Review of Systems  Constitutional: Positive for fever and chills.  HENT: Negative for congestion and rhinorrhea.   Eyes: Negative for visual disturbance.  Respiratory: Positive for cough. Negative for shortness of breath.   Cardiovascular: Positive for leg swelling (baseline). Negative for chest pain.  Gastrointestinal: Positive for nausea and diarrhea. Negative for vomiting and blood in stool.  Genitourinary: Negative for dysuria.  Musculoskeletal: Negative for back pain and neck pain.  Skin: Negative for rash.  Neurological: Positive for headaches.  Hematological: Does not bruise/bleed easily.  Psychiatric/Behavioral: Negative for confusion.  All other systems reviewed and are negative.     Allergies  Tape  Home Medications   Prior to Admission medications   Medication Sig Start Date End Date Taking? Authorizing Provider  aspirin 325 MG tablet Take 325 mg by mouth 2 (two) times daily.     Historical Provider, MD  b complex vitamins tablet Take 0.5 tablets by mouth daily.    Historical Provider, MD  ciprofloxacin (CIPRO) 500 MG tablet 3 (three) times daily. 09/12/14   Historical Provider, MD  clopidogrel (PLAVIX) 75 MG tablet Take 1 tablet (75 mg total) by mouth daily. Patient not taking: Reported on 09/13/2014 10/25/13   Conrad Weston, MD  finasteride (PROSCAR) 5 MG tablet Take 5 mg by mouth daily before breakfast.     Historical Provider, MD  gabapentin (NEURONTIN) 300 MG capsule Take 1 capsule (300 mg total) by mouth 3 (three) times daily. 09/13/14   Conrad Hopkins, MD  hydrocortisone (PROCTOCORT) 1 % CREA Apply 1 application topically 2 (two) times daily. Patient not taking: Reported on 09/13/2014 01/29/14   Gatha Mayer, MD  magnesium oxide (MAG-OX) 400 MG tablet Take 400 mg  by mouth 2 (two) times daily.     Historical Provider, MD  mycophenolate (CELLCEPT) 250 MG capsule Take 500 mg by mouth 2 (two) times daily.     Historical Provider, MD  NIFEdipine (PROCARDIA-XL/ADALAT CC) 30 MG 24 hr tablet  06/27/14   Historical Provider, MD  NIFEdipine (PROCARDIA-XL/ADALAT-CC/NIFEDICAL-XL) 30 MG 24 hr tablet Take 30 mg by mouth daily.     Historical Provider, MD  omeprazole (PRILOSEC) 20 MG capsule Take 20 mg by mouth 2 (two) times daily before a meal.    Historical Provider, MD  rosuvastatin (CRESTOR) 5 MG tablet Take 5 mg by mouth daily before breakfast.     Historical Provider, MD  tacrolimus (PROGRAF) 1 MG capsule Take 3 mg by mouth 2 (two) times daily.    Historical Provider,  MD  tadalafil (CIALIS) 20 MG tablet Take 20 mg by mouth daily as needed for erectile dysfunction.    Historical Provider, MD  tamsulosin (FLOMAX) 0.4 MG CAPS capsule Take 0.4 mg by mouth daily.    Historical Provider, MD  vitamin B-12 (CYANOCOBALAMIN) 500 MCG tablet Take 500 mcg by mouth daily.    Historical Provider, MD  vitamin C (ASCORBIC ACID) 500 MG tablet Take 500 mg by mouth daily.    Historical Provider, MD  zolpidem (AMBIEN) 10 MG tablet Take 10 mg by mouth at bedtime as needed for sleep.    Historical Provider, MD   BP 128/69 mmHg  Pulse 86  Temp(Src) 102.1 F (38.9 C) (Oral)  Resp 22  SpO2 100% Physical Exam  Constitutional: He is oriented to person, place, and time. He appears well-developed and well-nourished. No distress.  HENT:  Head: Normocephalic and atraumatic.  Mouth/Throat: Oropharynx is clear and moist.  Eyes: Conjunctivae and EOM are normal. Pupils are equal, round, and reactive to light.  Neck: Normal range of motion. Neck supple. No tracheal deviation present.  Cardiovascular: Normal rate and normal heart sounds.  Exam reveals no gallop and no friction rub.   No murmur heard. Pulmonary/Chest: Breath sounds normal. No respiratory distress.  Abdominal: Soft. He exhibits  no distension. There is no tenderness. There is no rebound and no guarding.  Musculoskeletal: Normal range of motion. He exhibits edema. He exhibits no tenderness.  Trace pitting edema in distal LLE which is baseline for pt.   Neurological: He is alert and oriented to person, place, and time.  Skin: Skin is warm and dry.  Psychiatric: He has a normal mood and affect. His behavior is normal.  Nursing note and vitals reviewed.   ED Course  Procedures  DIAGNOSTIC STUDIES: Oxygen Saturation is 100% on RA, normal by my interpretation.    COORDINATION OF CARE: 1:21 AM Discussed treatment plan which includes getting a CX-ray and ordering pain medication with pt. Also discussed with pt to treat for a UTI considering his past medical history. Pt acknowledges and agrees to plan.   Labs Review Labs Reviewed  CBC WITH DIFFERENTIAL/PLATELET - Abnormal; Notable for the following:    Lymphocytes Relative 10 (*)    Monocytes Absolute 1.1 (*)    All other components within normal limits  COMPREHENSIVE METABOLIC PANEL - Abnormal; Notable for the following:    Sodium 134 (*)    CO2 20 (*)    Glucose, Bld 128 (*)    BUN 23 (*)    Creatinine, Ser 1.44 (*)    Albumin 3.4 (*)    ALT 16 (*)    GFR calc non Af Amer 52 (*)    All other components within normal limits  LIPASE, BLOOD - Abnormal; Notable for the following:    Lipase 19 (*)    All other components within normal limits  URINALYSIS, ROUTINE W REFLEX MICROSCOPIC (NOT AT [facility]) - Abnormal; Notable for the following:    Color, Urine AMBER (*)    APPearance CLOUDY (*)    Bilirubin Urine SMALL (*)    Ketones, ur 15 (*)    Leukocytes, UA MODERATE (*)    All other components within normal limits  URINE CULTURE  URINE MICROSCOPIC-ADD ON    Imaging Review Dg Chest 2 View  10/02/2014   CLINICAL DATA:  Acute onset of cough and left-sided chest pain. Fever. Initial encounter.  EXAM: CHEST  2 VIEW  COMPARISON:  Chest radiograph performed  04/19/2013  FINDINGS: The lungs are well-aerated. Patchy left upper and midlung zone airspace opacities are concerning for pneumonia. Underlying peribronchial thickening is noted. There is no evidence of pleural effusion or pneumothorax.  The heart is normal in size; the mediastinal contour is within normal limits. No acute osseous abnormalities are seen.  IMPRESSION: Patchy left upper and midlung zone airspace opacities are concerning for pneumonia. Underlying peribronchial thickening noted.   Electronically Signed   By: Garald Balding M.D.   On: 10/02/2014 02:41     EKG Interpretation None      MDM   Final diagnoses:  Cough  UTI (lower urinary tract infection)  CAP (community acquired pneumonia)    7:42 AM 59 y.o. male history of renal transplant who presents with fever over the last 4-5 days. He states that he has had a cough productive of thick sputum and some mild suprapubic pressure. He denies any abdominal pain and has a soft and benign abdomen. He denies any vomiting or diarrhea, chest pain, or shortness of breath. Febrile here but vital signs otherwise unremarkable.  Urinalysis is unimpressive but does have moderate leuks. Will send cx. Chest x-ray consistent with pneumonia. I discussed in abx options with pharmacy and they recommended Levaquin to cover both CAP and UTI. He did get a dose of Rocephin here. I offered admission but the patient preferred to go home. I think this is reasonable given his continued well appearance and stable VS.   7:43 AM:  I have discussed the diagnosis/risks/treatment options with the patient and family and believe the pt to be eligible for discharge home to follow-up with his pcp as needed. We also discussed returning to the ED immediately if new or worsening sx occur. We discussed the sx which are most concerning (e.g., intractable fever, dehydration, abd pain, cp, sob, inability to tolerate abx) that necessitate immediate return. Medications administered  to the patient during their visit and any new prescriptions provided to the patient are listed below.  Medications given during this visit Medications  acetaminophen (TYLENOL) tablet 650 mg (650 mg Oral Given 10/01/14 2030)  sodium chloride 0.9 % bolus 500 mL (0 mLs Intravenous Stopped 10/02/14 0317)  cefTRIAXone (ROCEPHIN) 1 g in dextrose 5 % 50 mL IVPB (0 g Intravenous Stopped 10/02/14 0238)  acetaminophen (TYLENOL) tablet 650 mg (650 mg Oral Given 10/02/14 0209)  levofloxacin (LEVAQUIN) tablet 750 mg (750 mg Oral Given 10/02/14 0321)    Discharge Medication List as of 10/02/2014  3:43 AM        I personally performed the services described in this documentation, which was scribed in my presence. The recorded information has been reviewed and is accurate.     Pamella Pert, MD 10/02/14 [phone]

## 2014-10-03 LAB — URINE CULTURE
COLONY COUNT: NO GROWTH
CULTURE: NO GROWTH

## 2014-11-01 ENCOUNTER — Encounter: Payer: Self-pay | Admitting: Internal Medicine

## 2014-11-01 ENCOUNTER — Ambulatory Visit (INDEPENDENT_AMBULATORY_CARE_PROVIDER_SITE_OTHER): Payer: Medicare Other | Admitting: Internal Medicine

## 2014-11-01 VITALS — BP 112/62 | HR 79 | Ht 70.0 in | Wt 142.0 lb

## 2014-11-01 DIAGNOSIS — E785 Hyperlipidemia, unspecified: Secondary | ICD-10-CM

## 2014-11-01 DIAGNOSIS — R011 Cardiac murmur, unspecified: Secondary | ICD-10-CM | POA: Diagnosis not present

## 2014-11-01 DIAGNOSIS — R0989 Other specified symptoms and signs involving the circulatory and respiratory systems: Secondary | ICD-10-CM | POA: Diagnosis not present

## 2014-11-01 DIAGNOSIS — I739 Peripheral vascular disease, unspecified: Secondary | ICD-10-CM

## 2014-11-01 LAB — LIPID PANEL
Cholesterol: 192 mg/dL (ref 0–200)
HDL: 58.4 mg/dL (ref >39.00)
LDL Cholesterol: 107 mg/dL — ABNORMAL HIGH (ref 0–99)
NonHDL: 133.6
Total CHOL/HDL Ratio: 3
Triglycerides: 135 mg/dL (ref 0.0–149.0)
VLDL: 27 mg/dL (ref 0.0–40.0)

## 2014-11-01 LAB — AST: AST: 22 U/L (ref 0–37)

## 2014-11-01 NOTE — Progress Notes (Signed)
Cardiology Office Note   Date:  11/01/2014   ID:  Scott Chavez, DOB Nov 17, 1955, MRN [id number]  PCP:  Louis Meckel, MD  Cardiologist:   Dorris Carnes, MD   No chief complaint on file.  Pt presents for evaluation of CP     History of Present Illness: Scott Chavez is a 59 y.o. male with a history of PVOD  Followed at [facility]  Also a history of CKD  (s/p kidney transplant), HTN, HL    Seen by Dr Tilford Pillar   Had episode of CP  About 2 wiks ago  None since   Admits to diet indiscetion    Gets some pains with stress and food   Using e ciger  Breathing is OK Active  Sometimes SOB   No dizziness   Just got over pneumonia        Current Outpatient Prescriptions  Medication Sig Dispense Refill  . aspirin 325 MG tablet Take 325 mg by mouth 2 (two) times daily.     . finasteride (PROSCAR) 5 MG tablet Take 5 mg by mouth daily before breakfast.     . gabapentin (NEURONTIN) 300 MG capsule Take 1 capsule (300 mg total) by mouth 3 (three) times daily. 30 capsule 11  . levofloxacin (LEVAQUIN) 750 MG tablet Take 1 tablet (750 mg total) by mouth daily. X 7 days 7 tablet 0  . magnesium oxide (MAG-OX) 400 MG tablet Take 400 mg by mouth 2 (two) times daily.     . mycophenolate (CELLCEPT) 250 MG capsule Take 500 mg by mouth 2 (two) times daily.     Marland Kitchen NIFEdipine (PROCARDIA-XL/ADALAT-CC/NIFEDICAL-XL) 30 MG 24 hr tablet Take 30 mg by mouth daily.     Marland Kitchen omeprazole (PRILOSEC) 20 MG capsule Take 20 mg by mouth daily.     . rosuvastatin (CRESTOR) 5 MG tablet Take 5 mg by mouth daily before breakfast.     . tacrolimus (PROGRAF) 1 MG capsule Take 3 mg by mouth 2 (two) times daily.    . tadalafil (CIALIS) 20 MG tablet Take 20 mg by mouth daily as needed for erectile dysfunction.    . tamsulosin (FLOMAX) 0.4 MG CAPS capsule Take 0.4 mg by mouth daily.    Marland Kitchen zolpidem (AMBIEN) 10 MG tablet Take 10 mg by mouth at bedtime as needed for sleep.     No current facility-administered medications for  this visit.    Allergies:   Tape   Past Medical History  Diagnosis Date  . History of renal failure   . Hemodialysis patient     "on dialysis 1998-2010" 04/25/2013)  . Hypertension   . Hypercholesteremia   . Bladder dysfunction   . Peripheral vascular disease   . Aorto-iliac disease   . ESRD (end stage renal disease)     [facility] followed by Dr. Moshe Cipro   . GERD (gastroesophageal reflux disease)   . Anemia     pt. not sure, but reports he is taking Fe for one month now.  . Self-catheterizes urinary bladder     pt. choice, but he reports that he desires to do this to avoid retention   . Internal hemorrhoids     Colonoscopy 2010 and anoscopy 2015    Past Surgical History  Procedure Laterality Date  . Av fistula placement Left 1998    "removed 2011" (04/25/2013)  . Nephrectomy recipient  2010  . S/p cadaveric renal transplant  December 2010    [facility]  .  Femoral-popliteal bypass graft Left 04/24/2013    Procedure: BYPASS GRAFT COMMON FEMORALARTERY-BELOW KNEE POPLITEAL ARTERY WITH GREATER SAPHENOUS VEIN;  Surgeon: Conrad [place], MD;  Location: [place];  Service: Vascular;  Laterality: Left;  . Intraoperative arteriogram Left 04/24/2013    Procedure: INTRA OPERATIVE ARTERIOGRAM;  Surgeon: Conrad [place], MD;  Location: [place];  Service: Vascular;  Laterality: Left;  . Av fistula repair Left     removal with partial vein removed  . Colonoscopy    . Abdominal aortagram N/A 03/09/2013    Procedure: ABDOMINAL Maxcine Ham;  Surgeon: Elam Dutch, MD;  Location: [facility] CATH LAB;  Service: Cardiovascular;  Laterality: N/A;  . Lower extremity angiogram Bilateral 03/09/2013    Procedure: LOWER EXTREMITY ANGIOGRAM;  Surgeon: Elam Dutch, MD;  Location: [facility] CATH LAB;  Service: Cardiovascular;  Laterality: Bilateral;  . Lower extremity angiogram Right 10/25/2013    Procedure: LOWER EXTREMITY ANGIOGRAM;  Surgeon: Conrad [place], MD;  Location: [facility] CATH LAB;   Service: Cardiovascular;  Laterality: Right;  . Lower extremity angiogram Right 12/13/2013    Procedure: LOWER EXTREMITY ANGIOGRAM;  Surgeon: Conrad Ponderay, MD;  Location: [facility] CATH LAB;  Service: Cardiovascular;  Laterality: Right;     Social History:  The patient  reports that he has been smoking E-cigarettes.  He has a 17 pack-year smoking history. He has never used smokeless tobacco. He reports that he uses illicit drugs (Cocaine, Marijuana, and Heroin). He reports that he does not drink alcohol.   Family History:  The patient's family history includes Hypertension in his father and mother; Other in his father.    ROS:  Please see the history of present illness. All other systems are reviewed and  Negative to the above problem except as noted.    PHYSICAL EXAM: VS:  BP 112/62 mmHg  Pulse 79  Ht 5\' 10"  (1.778 m)  Wt 142 lb (64.411 kg)  BMI 20.37 kg/m2  GEN: Well nourished, well developed, in no acute distress HEENT: normal Neck: no JVD, carotid bruits, or masses Cardiac: RRR; no murmurs, rubs, or gallops,no edema  Respiratory:  clear to auscultation bilaterally, normal work of breathing GI: soft, nontender, nondistended, + BS  No hepatomegaly  MS: no deformity Moving all extremities   Skin: warm and dry, no rash Neuro:  Strength and sensation are intact Psych: euthymic mood, full affect   EKG:  EKG is ordered today.  SR 79 bpm     Lipid Panel No results found for: CHOL, TRIG, HDL, CHOLHDL, VLDL, LDLCALC, LDLDIRECT    Wt Readings from Last 3 Encounters:  11/01/14 142 lb (64.411 kg)  09/13/14 144 lb (65.318 kg)  03/15/14 143 lb (64.864 kg)      ASSESSMENT AND PLAN:  1  CP  Atypical  But pt has signif risk factors.  I would set up for echo and lexiscan myoview.  2.  PVOD  Would set up for carotid doppler   3.  HL  Will need lipids checked.  Keep on statin   Signed, Dorris Carnes, MD  11/01/2014 8:39 AM    [address],  [place], [place]  [address] Phone: [phone]; Fax: [phone]

## 2014-11-01 NOTE — Patient Instructions (Signed)
Your physician recommends that you continue on your current medications as directed. Please refer to the Current Medication list given to you today. Your physician recommends that you return for lab work TODAY (LIPIDS, AST)  Your physician has requested that you have an echocardiogram. Echocardiography is a painless test that uses sound waves to create images of your heart. It provides your doctor with information about the size and shape of your heart and how well your heart's chambers and valves are working. This procedure takes approximately one hour. There are no restrictions for this procedure.  Your physician has requested that you have a carotid duplex. This test is an ultrasound of the carotid arteries in your neck. It looks at blood flow through these arteries that supply the brain with blood. Allow one hour for this exam. There are no restrictions or special instructions.  Your physician has requested that you have a lexiscan myoview. For further information please visit [email or web address]. Please follow instruction sheet, as given.

## 2014-11-11 ENCOUNTER — Ambulatory Visit (HOSPITAL_COMMUNITY): Payer: Medicare Other

## 2014-11-11 ENCOUNTER — Ambulatory Visit (HOSPITAL_COMMUNITY): Payer: Medicare Other | Attending: Internal Medicine

## 2014-11-11 DIAGNOSIS — R011 Cardiac murmur, unspecified: Secondary | ICD-10-CM

## 2014-11-11 DIAGNOSIS — R0989 Other specified symptoms and signs involving the circulatory and respiratory systems: Secondary | ICD-10-CM

## 2014-11-11 DIAGNOSIS — I739 Peripheral vascular disease, unspecified: Secondary | ICD-10-CM | POA: Diagnosis not present

## 2014-11-11 DIAGNOSIS — I6523 Occlusion and stenosis of bilateral carotid arteries: Secondary | ICD-10-CM

## 2014-11-11 DIAGNOSIS — E785 Hyperlipidemia, unspecified: Secondary | ICD-10-CM

## 2014-11-15 ENCOUNTER — Encounter (HOSPITAL_COMMUNITY): Payer: Medicare Other

## 2014-11-22 ENCOUNTER — Other Ambulatory Visit (HOSPITAL_COMMUNITY): Payer: Medicare Other

## 2014-11-22 ENCOUNTER — Encounter (HOSPITAL_COMMUNITY): Payer: Medicare Other

## 2014-12-09 ENCOUNTER — Telehealth (HOSPITAL_COMMUNITY): Payer: Self-pay

## 2014-12-09 NOTE — Telephone Encounter (Signed)
Left message on voicemail in reference to upcoming appointment scheduled for 12-12-2014. Phone number given for a call back so details instructions can be given. Scott Chavez A   

## 2014-12-10 ENCOUNTER — Telehealth (HOSPITAL_COMMUNITY): Payer: Self-pay

## 2014-12-10 NOTE — Telephone Encounter (Signed)
Patient given detailed instructions per Myocardial Perfusion Study Information Sheet for test on 12-12-2014 at 0930. Patient Notified to arrive at 0815 for Echo, and that it is imperative to arrive on time for appointment to keep from having the test rescheduled. Patient verbalized understanding. Oletta Lamas, Seamus Warehime A

## 2014-12-12 ENCOUNTER — Ambulatory Visit (HOSPITAL_COMMUNITY): Payer: Medicare Other | Attending: Internal Medicine

## 2014-12-12 ENCOUNTER — Other Ambulatory Visit: Payer: Self-pay | Admitting: Internal Medicine

## 2014-12-12 ENCOUNTER — Ambulatory Visit (HOSPITAL_BASED_OUTPATIENT_CLINIC_OR_DEPARTMENT_OTHER): Payer: Medicare Other

## 2014-12-12 ENCOUNTER — Other Ambulatory Visit: Payer: Self-pay

## 2014-12-12 DIAGNOSIS — E785 Hyperlipidemia, unspecified: Secondary | ICD-10-CM | POA: Diagnosis not present

## 2014-12-12 DIAGNOSIS — R011 Cardiac murmur, unspecified: Secondary | ICD-10-CM

## 2014-12-12 DIAGNOSIS — I739 Peripheral vascular disease, unspecified: Secondary | ICD-10-CM | POA: Insufficient documentation

## 2014-12-12 DIAGNOSIS — I517 Cardiomegaly: Secondary | ICD-10-CM | POA: Insufficient documentation

## 2014-12-12 DIAGNOSIS — I10 Essential (primary) hypertension: Secondary | ICD-10-CM

## 2014-12-12 DIAGNOSIS — R0989 Other specified symptoms and signs involving the circulatory and respiratory systems: Secondary | ICD-10-CM | POA: Diagnosis not present

## 2014-12-12 DIAGNOSIS — I70219 Atherosclerosis of native arteries of extremities with intermittent claudication, unspecified extremity: Secondary | ICD-10-CM

## 2014-12-12 LAB — MYOCARDIAL PERFUSION IMAGING
CHL CUP RESTING HR STRESS: 51 {beats}/min
LVDIAVOL: 109 mL
LVSYSVOL: 47 mL
Peak HR: 80 {beats}/min
RATE: 0.26
SDS: 1
SRS: 6
SSS: 7
TID: 1.07

## 2014-12-12 MED ORDER — REGADENOSON 0.4 MG/5ML IV SOLN
0.4000 mg | Freq: Once | INTRAVENOUS | Status: AC
  Administered 2014-12-12: 0.4 mg via INTRAVENOUS

## 2014-12-12 MED ORDER — TECHNETIUM TC 99M SESTAMIBI GENERIC - CARDIOLITE
30.4000 | Freq: Once | INTRAVENOUS | Status: AC | PRN
  Administered 2014-12-12: 30 via INTRAVENOUS

## 2014-12-12 MED ORDER — TECHNETIUM TC 99M SESTAMIBI GENERIC - CARDIOLITE
10.3000 | Freq: Once | INTRAVENOUS | Status: AC | PRN
  Administered 2014-12-12: 10 via INTRAVENOUS

## 2014-12-23 ENCOUNTER — Telehealth: Payer: Self-pay

## 2014-12-23 NOTE — Telephone Encounter (Signed)
-----   Message from Scott Carnes V, MD sent at 12/22/2014  4:42 PM EDT ----- Stres test is normal  CP does not appears to be due to a blood flow problem with heart

## 2014-12-23 NOTE — Telephone Encounter (Signed)
Called to give pt myoview results.lmtcb 

## 2014-12-24 NOTE — Telephone Encounter (Signed)
Pt aware of stress myoview results. Stres test is normal CP does not appears to be due to a blood flow problem with heart Pt verbalized understanding.

## 2014-12-24 NOTE — Telephone Encounter (Signed)
Follow up      Calling to get test results

## 2014-12-24 NOTE — Telephone Encounter (Signed)
-----   Message from Dorris Carnes V, MD sent at 12/22/2014  4:42 PM EDT ----- Stres test is normal  CP does not appears to be due to a blood flow problem with heart

## 2014-12-25 ENCOUNTER — Telehealth: Payer: Self-pay | Admitting: Nurse Practitioner

## 2014-12-25 NOTE — Telephone Encounter (Signed)
Mild plaquing in carotids.     F/U for 2 years    Pt should keep on same medicines.         ----- Message -----     From: Baxter Flattery     Sent: 12/25/2014  8:49 AM      To: Fay Records, MD        CAROTID REPORT     Results reviewed with patient who verbalized understanding

## 2015-01-14 ENCOUNTER — Other Ambulatory Visit: Payer: Self-pay

## 2015-01-30 ENCOUNTER — Encounter (HOSPITAL_COMMUNITY): Payer: Self-pay | Admitting: Emergency Medicine

## 2015-01-30 ENCOUNTER — Emergency Department (HOSPITAL_COMMUNITY)
Admission: EM | Admit: 2015-01-30 | Discharge: 2015-01-30 | Disposition: A | Discharge status: Home / Self Care | Payer: Medicare Other | Attending: Emergency Medicine | Admitting: Emergency Medicine

## 2015-01-30 DIAGNOSIS — Z992 Dependence on renal dialysis: Secondary | ICD-10-CM | POA: Insufficient documentation

## 2015-01-30 DIAGNOSIS — H9202 Otalgia, left ear: Secondary | ICD-10-CM | POA: Diagnosis present

## 2015-01-30 DIAGNOSIS — Z7982 Long term (current) use of aspirin: Secondary | ICD-10-CM | POA: Insufficient documentation

## 2015-01-30 DIAGNOSIS — N186 End stage renal disease: Secondary | ICD-10-CM | POA: Diagnosis not present

## 2015-01-30 DIAGNOSIS — H6122 Impacted cerumen, left ear: Secondary | ICD-10-CM | POA: Diagnosis not present

## 2015-01-30 DIAGNOSIS — K219 Gastro-esophageal reflux disease without esophagitis: Secondary | ICD-10-CM | POA: Diagnosis not present

## 2015-01-30 DIAGNOSIS — Z9889 Other specified postprocedural states: Secondary | ICD-10-CM | POA: Insufficient documentation

## 2015-01-30 DIAGNOSIS — Z79899 Other long term (current) drug therapy: Secondary | ICD-10-CM | POA: Insufficient documentation

## 2015-01-30 DIAGNOSIS — E780 Pure hypercholesterolemia: Secondary | ICD-10-CM | POA: Diagnosis not present

## 2015-01-30 DIAGNOSIS — I120 Hypertensive chronic kidney disease with stage 5 chronic kidney disease or end stage renal disease: Secondary | ICD-10-CM | POA: Insufficient documentation

## 2015-01-30 DIAGNOSIS — Z862 Personal history of diseases of the blood and blood-forming organs and certain disorders involving the immune mechanism: Secondary | ICD-10-CM | POA: Diagnosis not present

## 2015-01-30 DIAGNOSIS — Z720 Tobacco use: Secondary | ICD-10-CM | POA: Insufficient documentation

## 2015-01-30 MED ORDER — HYDROGEN PEROXIDE 3 % EX SOLN
CUTANEOUS | Status: AC
  Filled 2015-01-30: qty 473

## 2015-01-30 MED ORDER — HYDROGEN PEROXIDE 3 % EX SOLN
Freq: Once | CUTANEOUS | Status: AC
  Administered 2015-01-30: 16:00:00 via TOPICAL

## 2015-01-30 MED ORDER — CARBAMIDE PEROXIDE 6.5 % OT SOLN: 5.0000 [drp] | Freq: Two times a day (BID) | OTIC | Status: DC

## 2015-01-30 NOTE — ED Notes (Addendum)
Three days ago pt began having left ear pain after getting water in it. States he tried a home remedy of placing H2O2 and sweet oil mix into left ear with no improvement. Also complaining of some hearing loss as well. In triage there appears to be a cerumen impaction to left ear, completely blocking outer canula with a nearly complete blockage to right ear also.

## 2015-01-30 NOTE — ED Provider Notes (Signed)
CSN: [id number]     Arrival date & time 01/30/15  1446 History  By signing my name below, I, Scott Chavez, attest that this documentation has been prepared under the direction and in the presence of non-physician practitioner, Jeannett Senior, PA-C. Electronically Signed: Evelene Chavez, Scribe. 01/30/2015. 4:16 PM.  Chief Complaint  Patient presents with  . Otalgia   The history is provided by the patient. No language interpreter was used.    HPI Comments:  Scott Chavez is a 59 y.o. male who presents to the Emergency Department complaining of 7/10  left ear pain for 3-4 days. He states he has a  "wax build up" in the ear. He notes decreased hearing from the ear. He's put water, q-tips and a bobby pin in his ear in an attempt to clear it out but has been unsuccessful. Pt denies discharge/drainage from the ear. He has no other symptoms or complaints at this time.   Past Medical History  Diagnosis Date  . History of renal failure   . Hemodialysis patient     "on dialysis 1998-2010" 04/25/2013)  . Hypertension   . Hypercholesteremia   . Bladder dysfunction   . Peripheral vascular disease   . Aorto-iliac disease   . ESRD (end stage renal disease)     trans[facility]- [facility] , followed by Dr. Moshe Cipro   . GERD (gastroesophageal reflux disease)   . Anemia     pt. not sure, but reports he is taking Fe for one month now.  . Self-catheterizes urinary bladder     pt. choice, but he reports that he desires to do this to avoid retention   . Internal hemorrhoids     Colonoscopy 2010 and anoscopy 2015   Past Surgical History  Procedure Laterality Date  . Av fistula placement Left 1998    "removed 2011" (04/25/2013)  . Nephrectomy recipient  2010  . S/p cadaveric renal transplant  December 2010    [place] bypass graft Left 04/24/2013    Procedure: BYPASS GRAFT COMMON FEMORALARTERY-BELOW KNEE POPLITEAL ARTERY WITH GREATER SAPHENOUS VEIN;   Surgeon: Conrad Leon, MD;  Location: [place];  Service: Vascular;  Laterality: Left;  . Intraoperative arteriogram Left 04/24/2013    Procedure: INTRA OPERATIVE ARTERIOGRAM;  Surgeon: Conrad [place], MD;  Location: [place];  Service: Vascular;  Laterality: Left;  . Av fistula repair Left     removal with partial vein removed  . Colonoscopy    . Abdominal aortagram N/A 03/09/2013    Procedure: ABDOMINAL Maxcine Ham;  Surgeon: Elam Dutch, MD;  Location: [facility] CATH LAB;  Service: Cardiovascular;  Laterality: N/A;  . Lower extremity angiogram Bilateral 03/09/2013    Procedure: LOWER EXTREMITY ANGIOGRAM;  Surgeon: Elam Dutch, MD;  Location: [facility] CATH LAB;  Service: Cardiovascular;  Laterality: Bilateral;  . Lower extremity angiogram Right 10/25/2013    Procedure: LOWER EXTREMITY ANGIOGRAM;  Surgeon: Conrad Tracy, MD;  Location: [facility] CATH LAB;  Service: Cardiovascular;  Laterality: Right;  . Lower extremity angiogram Right 12/13/2013    Procedure: LOWER EXTREMITY ANGIOGRAM;  Surgeon: Conrad [place], MD;  Location: [facility] CATH LAB;  Service: Cardiovascular;  Laterality: Right;   Family History  Problem Relation Age of Onset  . Hypertension Father   . Other Father     amputation  . Hypertension Mother    Social History  Substance Use Topics  . Smoking status: Light Tobacco Smoker -- 0.50 packs/day for 34 years  Types: E-cigarettes    Last Attempt to Quit: 02/17/2003  . Smokeless tobacco: Never Used     Comment: vapor cigrarette  . Alcohol Use: No    Review of Systems  Constitutional: Negative for fever and chills.  HENT: Positive for ear pain and hearing loss.     Allergies  Tape  Home Medications   Prior to Admission medications   Medication Sig Start Date End Date Taking? Authorizing Provider  aspirin 325 MG tablet Take 325 mg by mouth 2 (two) times daily.     Historical Provider, MD  finasteride (PROSCAR) 5 MG tablet Take 5 mg by mouth daily before breakfast.     Historical Provider, MD   gabapentin (NEURONTIN) 300 MG capsule Take 1 capsule (300 mg total) by mouth 3 (three) times daily. 09/13/14   Conrad [place], MD  levofloxacin (LEVAQUIN) 750 MG tablet Take 1 tablet (750 mg total) by mouth daily. X 7 days 10/02/14   Pamella Pert, MD  magnesium oxide (MAG-OX) 400 MG tablet Take 400 mg by mouth 2 (two) times daily.     Historical Provider, MD  mycophenolate (CELLCEPT) 250 MG capsule Take 500 mg by mouth 2 (two) times daily.     Historical Provider, MD  NIFEdipine (PROCARDIA-XL/ADALAT-CC/NIFEDICAL-XL) 30 MG 24 hr tablet Take 30 mg by mouth daily.     Historical Provider, MD  omeprazole (PRILOSEC) 20 MG capsule Take 20 mg by mouth daily.     Historical Provider, MD  rosuvastatin (CRESTOR) 5 MG tablet Take 5 mg by mouth daily before breakfast.     Historical Provider, MD  tacrolimus (PROGRAF) 1 MG capsule Take 3 mg by mouth 2 (two) times daily.    Historical Provider, MD  tadalafil (CIALIS) 20 MG tablet Take 20 mg by mouth daily as needed for erectile dysfunction.    Historical Provider, MD  tamsulosin (FLOMAX) 0.4 MG CAPS capsule Take 0.4 mg by mouth daily.    Historical Provider, MD  zolpidem (AMBIEN) 10 MG tablet Take 10 mg by mouth at bedtime as needed for sleep.    Historical Provider, MD   Pulse 67  Temp(Src) 98.9 F (37.2 C) (Oral)  Resp 16  SpO2 96% Physical Exam  Constitutional: He is oriented to person, place, and time. He appears well-developed and well-nourished. No distress.  HENT:  Head: Normocephalic and atraumatic.  Right Ear: External ear normal.  Left Ear: External ear normal.  Bilateral cerumen impaction   Eyes: Conjunctivae are normal.  Cardiovascular: Normal rate.   Pulmonary/Chest: Effort normal.  Abdominal: He exhibits no distension.  Neurological: He is alert and oriented to person, place, and time.  Skin: Skin is warm and dry.  Psychiatric: He has a normal mood and affect.  Nursing note and vitals reviewed.   ED Course  EAR CERUMEN  REMOVAL Date/Time: 01/30/2015 4:10 PM Performed by: Jeannett Senior Authorized by: Jeannett Senior Consent: Verbal consent obtained. Consent given by: patient Patient understanding: patient states understanding of the procedure being performed Patient identity confirmed: verbally with patient Local anesthetic: none Location details: left ear Procedure type: irrigation Patient sedated: no     DIAGNOSTIC STUDIES:  Oxygen Saturation is 96% on RA, normal by my interpretation.    COORDINATION OF CARE:  3:41 PM Discussed treatment plan with pt at bedside and pt agreed to plan.  Labs Review Labs Reviewed - No data to display  Imaging Review No results found. I have personally reviewed and evaluated these images and lab results as part  of my medical decision-making.   EKG Interpretation None      MDM   Final diagnoses:  Cerumen impaction, left     patient with left ear canal cerumen impaction. Removed with irrigation. TM appears to be normal. Patient feels much better. States his hearing is back to normal and pain has resolved. Plan to discharge home. Will give him Tobrex drops to apply and ears and follow-up as needed  Filed Vitals:   01/30/15 1452  Pulse: 67  Temp: 98.9 F (37.2 C)  TempSrc: Oral  Resp: 16  SpO2: 96%     Jeannett Senior, PA-C 01/30/15 1859  Harvel Quale, MD 02/01/15 [phone]

## 2015-01-30 NOTE — Discharge Instructions (Signed)
Use debrox daily for impaction. Irrigate with over the counter kits as needed. Follow up as needed.    Cerumen Impaction A cerumen impaction is when the wax in your ear forms a plug. This plug usually causes reduced hearing. Sometimes it also causes an earache or dizziness. Removing a cerumen impaction can be difficult and painful. The wax sticks to the ear canal. The canal is sensitive and bleeds easily. If you try to remove a heavy wax buildup with a cotton tipped swab, you may push it in further. Irrigation with water, suction, and small ear curettes may be used to clear out the wax. If the impaction is fixed to the skin in the ear canal, ear drops may be needed for a few days to loosen the wax. People who build up a lot of wax frequently can use ear wax removal products available in your local drugstore. SEEK MEDICAL CARE IF:  You develop an earache, increased hearing loss, or marked dizziness. Document Released: 05/27/2004 Document Revised: 07/12/2011 Document Reviewed: 07/17/2009 [facility] Patient Information 2015 [place], [place]. This information is not intended to replace advice given to you by your health care provider. Make sure you discuss any questions you have with your health care provider.

## 2015-01-31 ENCOUNTER — Other Ambulatory Visit: Payer: Self-pay | Admitting: *Deleted

## 2015-01-31 DIAGNOSIS — I70219 Atherosclerosis of native arteries of extremities with intermittent claudication, unspecified extremity: Secondary | ICD-10-CM

## 2015-01-31 DIAGNOSIS — M792 Neuralgia and neuritis, unspecified: Secondary | ICD-10-CM

## 2015-01-31 MED ORDER — GABAPENTIN 300 MG PO CAPS: 300.0000 mg | ORAL_CAPSULE | Freq: Three times a day (TID) | ORAL | Status: DC

## 2015-03-21 ENCOUNTER — Ambulatory Visit: Payer: Medicare Other | Admitting: Vascular Surgery

## 2015-03-21 ENCOUNTER — Ambulatory Visit (INDEPENDENT_AMBULATORY_CARE_PROVIDER_SITE_OTHER)
Admission: RE | Admit: 2015-03-21 | Discharge: 2015-03-21 | Disposition: A | Discharge status: Home / Self Care | Payer: Medicare Other | Source: Ambulatory Visit | Attending: Vascular Surgery | Admitting: Vascular Surgery

## 2015-03-21 ENCOUNTER — Other Ambulatory Visit: Payer: Self-pay | Admitting: Vascular Surgery

## 2015-03-21 ENCOUNTER — Ambulatory Visit (HOSPITAL_COMMUNITY)
Admission: RE | Admit: 2015-03-21 | Discharge: 2015-03-21 | Disposition: A | Discharge status: Home / Self Care | Payer: Medicare Other | Source: Ambulatory Visit | Attending: Vascular Surgery | Admitting: Vascular Surgery

## 2015-03-21 ENCOUNTER — Ambulatory Visit (INDEPENDENT_AMBULATORY_CARE_PROVIDER_SITE_OTHER)
Admission: RE | Admit: 2015-03-21 | Discharge: 2015-03-21 | Disposition: A | Discharge status: Home / Self Care | Payer: Medicare Other | Source: Ambulatory Visit | Attending: Physician Assistant | Admitting: Physician Assistant

## 2015-03-21 DIAGNOSIS — I739 Peripheral vascular disease, unspecified: Secondary | ICD-10-CM

## 2015-03-21 DIAGNOSIS — Z48812 Encounter for surgical aftercare following surgery on the circulatory system: Secondary | ICD-10-CM

## 2015-03-31 ENCOUNTER — Encounter: Payer: Self-pay | Admitting: Vascular Surgery

## 2015-04-02 ENCOUNTER — Encounter: Payer: Self-pay | Admitting: Vascular Surgery

## 2015-04-02 ENCOUNTER — Ambulatory Visit (INDEPENDENT_AMBULATORY_CARE_PROVIDER_SITE_OTHER): Payer: Medicare Other | Admitting: Vascular Surgery

## 2015-04-02 ENCOUNTER — Other Ambulatory Visit: Payer: Self-pay

## 2015-04-02 VITALS — BP 108/67 | HR 80 | Temp 99.0°F | Resp 16 | Ht 70.0 in | Wt 142.0 lb

## 2015-04-02 DIAGNOSIS — I70211 Atherosclerosis of native arteries of extremities with intermittent claudication, right leg: Secondary | ICD-10-CM
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
## 2015-04-08 ENCOUNTER — Encounter (HOSPITAL_COMMUNITY): Payer: Self-pay | Admitting: Pharmacy Technician

## 2015-04-10 ENCOUNTER — Encounter (HOSPITAL_COMMUNITY)
Admission: RE | Disposition: A | Discharge status: Home / Self Care | Payer: Self-pay | Source: Ambulatory Visit | Attending: Vascular Surgery

## 2015-04-10 ENCOUNTER — Ambulatory Visit (HOSPITAL_COMMUNITY)
Admission: RE | Admit: 2015-04-10 | Discharge: 2015-04-10 | Disposition: A | Discharge status: Home / Self Care | Payer: Medicare Other | Source: Ambulatory Visit | Attending: Vascular Surgery | Admitting: Vascular Surgery

## 2015-04-10 ENCOUNTER — Encounter (HOSPITAL_COMMUNITY): Payer: Self-pay | Admitting: Vascular Surgery

## 2015-04-10 DIAGNOSIS — Z8249 Family history of ischemic heart disease and other diseases of the circulatory system: Secondary | ICD-10-CM | POA: Diagnosis not present

## 2015-04-10 DIAGNOSIS — K219 Gastro-esophageal reflux disease without esophagitis: Secondary | ICD-10-CM | POA: Insufficient documentation

## 2015-04-10 DIAGNOSIS — Z7982 Long term (current) use of aspirin: Secondary | ICD-10-CM | POA: Insufficient documentation

## 2015-04-10 DIAGNOSIS — D649 Anemia, unspecified: Secondary | ICD-10-CM | POA: Insufficient documentation

## 2015-04-10 DIAGNOSIS — F17210 Nicotine dependence, cigarettes, uncomplicated: Secondary | ICD-10-CM | POA: Insufficient documentation

## 2015-04-10 DIAGNOSIS — I10 Essential (primary) hypertension: Secondary | ICD-10-CM | POA: Insufficient documentation

## 2015-04-10 DIAGNOSIS — I70219 Atherosclerosis of native arteries of extremities with intermittent claudication, unspecified extremity: Secondary | ICD-10-CM | POA: Diagnosis present

## 2015-04-10 DIAGNOSIS — I70211 Atherosclerosis of native arteries of extremities with intermittent claudication, right leg: Secondary | ICD-10-CM | POA: Diagnosis not present

## 2015-04-10 DIAGNOSIS — Z940 Kidney transplant status: Secondary | ICD-10-CM | POA: Insufficient documentation

## 2015-04-10 DIAGNOSIS — E7800 Pure hypercholesterolemia, unspecified: Secondary | ICD-10-CM | POA: Insufficient documentation

## 2015-04-10 DIAGNOSIS — I70213 Atherosclerosis of native arteries of extremities with intermittent claudication, bilateral legs: Secondary | ICD-10-CM | POA: Diagnosis not present

## 2015-04-10 HISTORY — PX: PERIPHERAL VASCULAR CATHETERIZATION: SHX172C

## 2015-04-10 LAB — POCT I-STAT, CHEM 8
BUN: 22 mg/dL — ABNORMAL HIGH (ref 6–20)
CALCIUM ION: 1.3 mmol/L — AB (ref 1.12–1.23)
CHLORIDE: 105 mmol/L (ref 101–111)
Creatinine, Ser: 1.1 mg/dL (ref 0.61–1.24)
GLUCOSE: 106 mg/dL — AB (ref 65–99)
HCT: 46 % (ref 39.0–52.0)
Hemoglobin: 15.6 g/dL (ref 13.0–17.0)
Potassium: 3.7 mmol/L (ref 3.5–5.1)
Sodium: 139 mmol/L (ref 135–145)
TCO2: 23 mmol/L (ref 0–100)

## 2015-04-10 SURGERY — ABDOMINAL AORTOGRAM
Anesthesia: LOCAL

## 2015-04-10 MED ORDER — HEPARIN (PORCINE) IN NACL 2-0.9 UNIT/ML-% IJ SOLN
INTRAMUSCULAR | Status: AC
  Filled 2015-04-10: qty 1000

## 2015-04-10 MED ORDER — SODIUM CHLORIDE 0.9 % IV SOLN
1.0000 mL/kg/h | INTRAVENOUS | Status: DC
  Administered 2015-04-10: 1 mL/kg/h via INTRAVENOUS

## 2015-04-10 MED ORDER — MIDAZOLAM HCL 2 MG/2ML IJ SOLN
INTRAMUSCULAR | Status: DC | PRN
  Administered 2015-04-10: 1 mg via INTRAVENOUS

## 2015-04-10 MED ORDER — OXYCODONE-ACETAMINOPHEN 5-325 MG PO TABS: 1.0000 | ORAL_TABLET | Freq: Four times a day (QID) | ORAL | Status: DC | PRN

## 2015-04-10 MED ORDER — MIDAZOLAM HCL 2 MG/2ML IJ SOLN
INTRAMUSCULAR | Status: AC
  Filled 2015-04-10: qty 2

## 2015-04-10 MED ORDER — MORPHINE SULFATE (PF) 2 MG/ML IV SOLN: 2.0000 mg | INTRAVENOUS | Status: DC | PRN

## 2015-04-10 MED ORDER — SODIUM CHLORIDE 0.9 % IV SOLN
INTRAVENOUS | Status: DC
  Administered 2015-04-10: 07:00:00 via INTRAVENOUS

## 2015-04-10 MED ORDER — FENTANYL CITRATE (PF) 100 MCG/2ML IJ SOLN
INTRAMUSCULAR | Status: AC
  Filled 2015-04-10: qty 2

## 2015-04-10 MED ORDER — LIDOCAINE HCL (PF) 1 % IJ SOLN
INTRAMUSCULAR | Status: DC | PRN
  Administered 2015-04-10: 09:00:00

## 2015-04-10 MED ORDER — LIDOCAINE HCL (PF) 1 % IJ SOLN
INTRAMUSCULAR | Status: AC
  Filled 2015-04-10: qty 30

## 2015-04-10 MED ORDER — FENTANYL CITRATE (PF) 100 MCG/2ML IJ SOLN
INTRAMUSCULAR | Status: DC | PRN
  Administered 2015-04-10: 50 ug via INTRAVENOUS

## 2015-04-10 MED ORDER — IODIXANOL 320 MG/ML IV SOLN
INTRAVENOUS | Status: DC | PRN
  Administered 2015-04-10: 55 mL via INTRA_ARTERIAL

## 2015-04-10 SURGICAL SUPPLY — 11 items
CATH OMNI FLUSH 5F 65CM (CATHETERS) ×1 IMPLANT
COVER PRB 48X5XTLSCP FOLD TPE (BAG) IMPLANT
COVER PROBE 5X48 (BAG) ×2
KIT MICROINTRODUCER STIFF 5F (SHEATH) ×1 IMPLANT
KIT PV (KITS) ×2 IMPLANT
SHEATH PINNACLE 5F 10CM (SHEATH) ×1 IMPLANT
SYR MEDRAD MARK V 150ML (SYRINGE) ×2 IMPLANT
TRANSDUCER W/STOPCOCK (MISCELLANEOUS) ×2 IMPLANT
TRAY PV CATH (CUSTOM PROCEDURE TRAY) ×2 IMPLANT
WIRE AMPLATZ SS-J .035X180CM (WIRE) ×1 IMPLANT
WIRE BENTSON .035X145CM (WIRE) ×1 IMPLANT

## 2015-04-10 NOTE — Discharge Instructions (Signed)

## 2015-04-10 NOTE — Interval H&P Note (Signed)
[facility] [place]  History and Physical Update  The patient was interviewed and re-examined.  The patient's previous History and Physical has been reviewed and is unchanged from my consult.  There is no change in the plan of care: Aortogram, right leg runoff.  I discussed with the patient the nature of angiographic procedures, especially the limited patencies of any endovascular intervention.  The patient is aware of that the risks of an angiographic procedure include but are not limited to: bleeding, infection, access site complications, renal failure, embolization, rupture of vessel, dissection, possible need for emergent surgical intervention, possible need for surgical procedures to treat the patient's pathology, anaphylactic reaction to contrast, and stroke and death.  The patient is aware of the risks and agrees to proceed.   [name], MD [facility] [place] Office: [phone] Pager: [phone]  04/10/2015, 7:25 AM
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
## 2015-04-14 ENCOUNTER — Other Ambulatory Visit: Payer: Self-pay

## 2015-04-16 ENCOUNTER — Encounter (HOSPITAL_COMMUNITY): Payer: Self-pay

## 2015-04-16 ENCOUNTER — Encounter (HOSPITAL_COMMUNITY)
Admission: RE | Admit: 2015-04-16 | Discharge: 2015-04-16 | Disposition: A | Discharge status: Home / Self Care | Payer: Medicare Other | Source: Ambulatory Visit | Attending: Vascular Surgery | Admitting: Vascular Surgery

## 2015-04-16 ENCOUNTER — Other Ambulatory Visit (HOSPITAL_COMMUNITY): Payer: Self-pay | Admitting: *Deleted

## 2015-04-16 HISTORY — DX: Pneumonia, unspecified organism: J18.9

## 2015-04-16 LAB — URINALYSIS, ROUTINE W REFLEX MICROSCOPIC
Bilirubin Urine: NEGATIVE
GLUCOSE, UA: NEGATIVE mg/dL
Hgb urine dipstick: NEGATIVE
KETONES UR: NEGATIVE mg/dL
Nitrite: NEGATIVE
PROTEIN: NEGATIVE mg/dL
Specific Gravity, Urine: 1.025 (ref 1.005–1.030)
pH: 6 (ref 5.0–8.0)

## 2015-04-16 LAB — COMPREHENSIVE METABOLIC PANEL
ALBUMIN: 3.9 g/dL (ref 3.5–5.0)
ALT: 31 U/L (ref 17–63)
AST: 36 U/L (ref 15–41)
Alkaline Phosphatase: 57 U/L (ref 38–126)
Anion gap: 7 (ref 5–15)
BUN: 16 mg/dL (ref 6–20)
CHLORIDE: 107 mmol/L (ref 101–111)
CO2: 25 mmol/L (ref 22–32)
CREATININE: 1.29 mg/dL — AB (ref 0.61–1.24)
Calcium: 10.1 mg/dL (ref 8.9–10.3)
GFR calc Af Amer: >60 mL/min (ref >60)
GFR calc non Af Amer: 59 mL/min — ABNORMAL LOW (ref >60)
GLUCOSE: 105 mg/dL — AB (ref 65–99)
POTASSIUM: 4.8 mmol/L (ref 3.5–5.1)
SODIUM: 139 mmol/L (ref 135–145)
Total Bilirubin: 0.6 mg/dL (ref 0.3–1.2)
Total Protein: 7.1 g/dL (ref 6.5–8.1)

## 2015-04-16 LAB — CBC
HCT: 46.4 % (ref 39.0–52.0)
Hemoglobin: 15.1 g/dL (ref 13.0–17.0)
MCH: 30.3 pg (ref 26.0–34.0)
MCHC: 32.5 g/dL (ref 30.0–36.0)
MCV: 93.2 fL (ref 78.0–100.0)
PLATELETS: 177 10*3/uL (ref 150–400)
RBC: 4.98 MIL/uL (ref 4.22–5.81)
RDW: 13.7 % (ref 11.5–15.5)
WBC: 3.2 10*3/uL — AB (ref 4.0–10.5)

## 2015-04-16 LAB — PROTIME-INR
INR: 1.03 (ref 0.00–1.49)
PROTHROMBIN TIME: 13.7 s (ref 11.6–15.2)

## 2015-04-16 LAB — URINE MICROSCOPIC-ADD ON: RBC / HPF: NONE SEEN RBC/hpf (ref 0–5)

## 2015-04-16 LAB — SURGICAL PCR SCREEN
MRSA, PCR: NEGATIVE
Staphylococcus aureus: POSITIVE — AB

## 2015-04-16 LAB — TYPE AND SCREEN
ABO/RH(D): O POS
ANTIBODY SCREEN: NEGATIVE

## 2015-04-16 LAB — APTT: APTT: 31 s (ref 24–37)

## 2015-04-16 NOTE — Progress Notes (Signed)
Pt denies cardiac history, chest pain or sob. Hx of kidney transplant.  ECHO - 12/12/14 in EPIC Stress - 12/12/14 in EPIC EKG - 11/01/14

## 2015-04-16 NOTE — Pre-Procedure Instructions (Signed)
Scott Chavez  04/16/2015     Your procedure is scheduled on Thursday, April 17, 2015 at 10:15 AM.   Report to [facility] Entrance "A" Admitting Office at 8:15 AM.   Call this number if you have problems the morning of surgery: [phone]     Remember:  Do not eat food or drink liquids after midnight tonight.  Take these medicines the morning of surgery with A SIP OF WATER: Aspirin, Finasteride (Proscar), Gabapentin (Neurontin), Mycophenolate (Cellcept), Nifedipine (Procardia), Omeprazole (Prilosec), Tacrolimus (Prograf), Tamsulosin (Flomax)   Do not wear jewelry.  Do not wear lotions, powders, or cologne.  You may wear deodorant.  Men may shave face and neck.  Do not bring valuables to the hospital.  [facility] is not responsible for any belongings or valuables.  Contacts, dentures or bridgework may not be worn into surgery.  Leave your suitcase in the car.  After surgery it may be brought to your room.  For patients admitted to the hospital, discharge time will be determined by your treatment team.  Special instructions:  [place] - Preparing for Surgery  Before surgery, you can play an important role.  Because skin is not sterile, your skin needs to be as free of germs as possible.  You can reduce the number of germs on you skin by washing with CHG (chlorahexidine gluconate) soap before surgery.  CHG is an antiseptic cleaner which kills germs and bonds with the skin to continue killing germs even after washing.  Please DO NOT use if you have an allergy to CHG or antibacterial soaps.  If your skin becomes reddened/irritated stop using the CHG and inform your nurse when you arrive at Short Stay.  Do not shave (including legs and underarms) for at least 48 hours prior to the first CHG shower.  You may shave your face.  Please follow these instructions carefully:   1.  Shower with CHG Soap the night before surgery and the                                 morning of Surgery.  2.  If you choose to wash your hair, wash your hair first as usual with your       normal shampoo.  3.  After you shampoo, rinse your hair and body thoroughly to remove the                      Shampoo.  4.  Use CHG as you would any other liquid soap.  You can apply chg directly       to the skin and wash gently with scrungie or a clean washcloth.  5.  Apply the CHG Soap to your body ONLY FROM THE NECK DOWN.        Do not use on open wounds or open sores.  Avoid contact with your eyes, ears, mouth and genitals (private parts).  Wash genitals (private parts) with your normal soap.  6.  Wash thoroughly, paying special attention to the area where your surgery        will be performed.  7.  Thoroughly rinse your body with warm water from the neck down.  8.  DO NOT shower/wash with your normal soap after using and rinsing off       the CHG Soap.  9.  Pat yourself dry with a clean towel.  10.  Wear clean pajamas.            11.  Place clean sheets on your bed the night of your first shower and do not        sleep with pets.  Day of Surgery  Do not apply any lotions the morning of surgery.  Please wear clean clothes to the hospital.   Please read over the following fact sheets that you were given. Pain Booklet, Coughing and Deep Breathing, Blood Transfusion Information and Surgical Site Infection Prevention

## 2015-04-17 ENCOUNTER — Inpatient Hospital Stay (HOSPITAL_COMMUNITY): Payer: Medicare Other | Admitting: Anesthesiology

## 2015-04-17 ENCOUNTER — Encounter (HOSPITAL_COMMUNITY)
Admission: AD | Disposition: A | Discharge status: Home / Self Care | Payer: Self-pay | Source: Ambulatory Visit | Attending: Vascular Surgery

## 2015-04-17 ENCOUNTER — Encounter (HOSPITAL_COMMUNITY): Payer: Self-pay | Admitting: *Deleted

## 2015-04-17 ENCOUNTER — Inpatient Hospital Stay (HOSPITAL_COMMUNITY)
Admission: AD | Admit: 2015-04-17 | Discharge: 2015-04-20 | DRG: 253 | Disposition: A | Discharge status: Home / Self Care | Payer: Medicare Other | Source: Ambulatory Visit | Attending: Vascular Surgery | Admitting: Vascular Surgery

## 2015-04-17 DIAGNOSIS — I70211 Atherosclerosis of native arteries of extremities with intermittent claudication, right leg: Principal | ICD-10-CM | POA: Diagnosis present

## 2015-04-17 DIAGNOSIS — D649 Anemia, unspecified: Secondary | ICD-10-CM | POA: Diagnosis present

## 2015-04-17 DIAGNOSIS — Z7982 Long term (current) use of aspirin: Secondary | ICD-10-CM | POA: Diagnosis not present

## 2015-04-17 DIAGNOSIS — K219 Gastro-esophageal reflux disease without esophagitis: Secondary | ICD-10-CM | POA: Diagnosis present

## 2015-04-17 DIAGNOSIS — Z940 Kidney transplant status: Secondary | ICD-10-CM | POA: Diagnosis not present

## 2015-04-17 DIAGNOSIS — Z720 Tobacco use: Secondary | ICD-10-CM

## 2015-04-17 DIAGNOSIS — I10 Essential (primary) hypertension: Secondary | ICD-10-CM | POA: Diagnosis present

## 2015-04-17 DIAGNOSIS — I739 Peripheral vascular disease, unspecified: Secondary | ICD-10-CM | POA: Diagnosis present

## 2015-04-17 DIAGNOSIS — Z91048 Other nonmedicinal substance allergy status: Secondary | ICD-10-CM | POA: Diagnosis not present

## 2015-04-17 DIAGNOSIS — E7800 Pure hypercholesterolemia, unspecified: Secondary | ICD-10-CM | POA: Diagnosis present

## 2015-04-17 HISTORY — PX: ENDARTERECTOMY FEMORAL: SHX5804

## 2015-04-17 HISTORY — PX: VEIN HARVEST: SHX6363

## 2015-04-17 HISTORY — PX: PATCH ANGIOPLASTY: SHX6230

## 2015-04-17 HISTORY — PX: FEMORAL-POPLITEAL BYPASS GRAFT: SHX937

## 2015-04-17 LAB — CBC
HEMATOCRIT: 38.7 % — AB (ref 39.0–52.0)
Hemoglobin: 12.8 g/dL — ABNORMAL LOW (ref 13.0–17.0)
MCH: 31.1 pg (ref 26.0–34.0)
MCHC: 33.1 g/dL (ref 30.0–36.0)
MCV: 93.9 fL (ref 78.0–100.0)
PLATELETS: 140 10*3/uL — AB (ref 150–400)
RBC: 4.12 MIL/uL — ABNORMAL LOW (ref 4.22–5.81)
RDW: 13.7 % (ref 11.5–15.5)
WBC: 6 10*3/uL (ref 4.0–10.5)

## 2015-04-17 LAB — CREATININE, SERUM
Creatinine, Ser: 1.12 mg/dL (ref 0.61–1.24)
GFR calc non Af Amer: >60 mL/min (ref >60)

## 2015-04-17 SURGERY — ENDARTERECTOMY, FEMORAL
Anesthesia: General | Site: Leg Upper | Laterality: Right

## 2015-04-17 MED ORDER — LIDOCAINE HCL (CARDIAC) 20 MG/ML IV SOLN
INTRAVENOUS | Status: DC | PRN
  Administered 2015-04-17: 60 mg via INTRAVENOUS

## 2015-04-17 MED ORDER — THROMBIN 20000 UNITS EX SOLR
CUTANEOUS | Status: AC
  Filled 2015-04-17: qty 20000

## 2015-04-17 MED ORDER — CHLORHEXIDINE GLUCONATE CLOTH 2 % EX PADS: 6.0000 | MEDICATED_PAD | Freq: Once | CUTANEOUS | Status: DC

## 2015-04-17 MED ORDER — PHENOL 1.4 % MT LIQD
1.0000 | OROMUCOSAL | Status: DC | PRN
Start: 2015-04-17 — End: 2015-04-20

## 2015-04-17 MED ORDER — DEXTROSE 5 % IV SOLN
1.5000 g | Freq: Two times a day (BID) | INTRAVENOUS | Status: AC
  Administered 2015-04-17 – 2015-04-18 (×2): 1.5 g via INTRAVENOUS
  Filled 2015-04-17 (×2): qty 1.5

## 2015-04-17 MED ORDER — 0.9 % SODIUM CHLORIDE (POUR BTL) OPTIME
TOPICAL | Status: DC | PRN
  Administered 2015-04-17: 2000 mL

## 2015-04-17 MED ORDER — ACETAMINOPHEN 160 MG/5ML PO SOLN: 325.0000 mg | ORAL | Status: DC | PRN

## 2015-04-17 MED ORDER — MUPIROCIN 2 % EX OINT
1.0000 "application " | TOPICAL_OINTMENT | Freq: Once | CUTANEOUS | Status: AC
  Administered 2015-04-17: 1 via TOPICAL
  Filled 2015-04-17: qty 22

## 2015-04-17 MED ORDER — ENOXAPARIN SODIUM 40 MG/0.4ML ~~LOC~~ SOLN
40.0000 mg | SUBCUTANEOUS | Status: DC
  Administered 2015-04-18 – 2015-04-20 (×3): 40 mg via SUBCUTANEOUS
  Filled 2015-04-17 (×3): qty 0.4

## 2015-04-17 MED ORDER — MAGNESIUM SULFATE 2 GM/50ML IV SOLN
2.0000 g | Freq: Every day | INTRAVENOUS | Status: DC | PRN
  Filled 2015-04-17: qty 50

## 2015-04-17 MED ORDER — PROPOFOL 10 MG/ML IV BOLUS
INTRAVENOUS | Status: AC
  Filled 2015-04-17: qty 20

## 2015-04-17 MED ORDER — POTASSIUM CHLORIDE CRYS ER 20 MEQ PO TBCR: 20.0000 meq | EXTENDED_RELEASE_TABLET | Freq: Every day | ORAL | Status: DC | PRN

## 2015-04-17 MED ORDER — HEPARIN SODIUM (PORCINE) 1000 UNIT/ML IJ SOLN
INTRAMUSCULAR | Status: AC
  Filled 2015-04-17: qty 2

## 2015-04-17 MED ORDER — FENTANYL CITRATE (PF) 250 MCG/5ML IJ SOLN
INTRAMUSCULAR | Status: AC
  Filled 2015-04-17: qty 5

## 2015-04-17 MED ORDER — SODIUM CHLORIDE 0.9 % IV SOLN: 500.0000 mL | Freq: Once | INTRAVENOUS | Status: DC | PRN

## 2015-04-17 MED ORDER — DOCUSATE SODIUM 100 MG PO CAPS
100.0000 mg | ORAL_CAPSULE | Freq: Every day | ORAL | Status: DC
  Administered 2015-04-18 – 2015-04-20 (×3): 100 mg via ORAL
  Filled 2015-04-17 (×3): qty 1

## 2015-04-17 MED ORDER — MORPHINE SULFATE (PF) 2 MG/ML IV SOLN
2.0000 mg | INTRAVENOUS | Status: DC | PRN
  Administered 2015-04-17: 2 mg via INTRAVENOUS
  Administered 2015-04-17 (×2): 4 mg via INTRAVENOUS
  Administered 2015-04-17: 2 mg via INTRAVENOUS
  Administered 2015-04-18 (×2): 4 mg via INTRAVENOUS
  Administered 2015-04-18: 2 mg via INTRAVENOUS
  Administered 2015-04-18 – 2015-04-19 (×5): 4 mg via INTRAVENOUS
  Administered 2015-04-19: 2 mg via INTRAVENOUS
  Filled 2015-04-17: qty 1
  Filled 2015-04-17 (×4): qty 2
  Filled 2015-04-17: qty 1
  Filled 2015-04-17 (×4): qty 2
  Filled 2015-04-17: qty 1
  Filled 2015-04-17 (×3): qty 2

## 2015-04-17 MED ORDER — ONDANSETRON HCL 4 MG/2ML IJ SOLN: 4.0000 mg | Freq: Four times a day (QID) | INTRAMUSCULAR | Status: DC | PRN

## 2015-04-17 MED ORDER — ALBUMIN HUMAN 5 % IV SOLN
INTRAVENOUS | Status: DC | PRN
Start: 2015-04-17 — End: 2015-04-17
  Administered 2015-04-17: 13:00:00 via INTRAVENOUS

## 2015-04-17 MED ORDER — DEXTROSE 5 % IV SOLN
1.5000 g | INTRAVENOUS | Status: AC
  Administered 2015-04-17 (×2): 1.5 g via INTRAVENOUS
  Filled 2015-04-17: qty 1.5

## 2015-04-17 MED ORDER — LACTATED RINGERS IV SOLN
INTRAVENOUS | Status: DC | PRN
  Administered 2015-04-17: 14:00:00 via INTRAVENOUS

## 2015-04-17 MED ORDER — ZOLPIDEM TARTRATE 5 MG PO TABS
10.0000 mg | ORAL_TABLET | Freq: Every evening | ORAL | Status: DC | PRN
  Administered 2015-04-18 (×2): 10 mg via ORAL
  Filled 2015-04-17 (×2): qty 2

## 2015-04-17 MED ORDER — PROPOFOL 10 MG/ML IV BOLUS
INTRAVENOUS | Status: DC | PRN
  Administered 2015-04-17 (×2): 20 mg via INTRAVENOUS
  Administered 2015-04-17: 100 mg via INTRAVENOUS

## 2015-04-17 MED ORDER — ROCURONIUM BROMIDE 100 MG/10ML IV SOLN
INTRAVENOUS | Status: DC | PRN
  Administered 2015-04-17: 40 mg via INTRAVENOUS
  Administered 2015-04-17: 10 mg via INTRAVENOUS
  Administered 2015-04-17: 20 mg via INTRAVENOUS
  Administered 2015-04-17: 10 mg via INTRAVENOUS

## 2015-04-17 MED ORDER — FENTANYL CITRATE (PF) 100 MCG/2ML IJ SOLN
INTRAMUSCULAR | Status: DC | PRN
Start: 2015-04-17 — End: 2015-04-17
  Administered 2015-04-17: 100 ug via INTRAVENOUS
  Administered 2015-04-17 (×3): 50 ug via INTRAVENOUS
  Administered 2015-04-17: 100 ug via INTRAVENOUS
  Administered 2015-04-17 (×3): 50 ug via INTRAVENOUS

## 2015-04-17 MED ORDER — HEMOSTATIC AGENTS (NO CHARGE) OPTIME
TOPICAL | Status: DC | PRN
  Administered 2015-04-17: 1 via TOPICAL

## 2015-04-17 MED ORDER — ROSUVASTATIN CALCIUM 5 MG PO TABS
5.0000 mg | ORAL_TABLET | Freq: Every day | ORAL | Status: DC
  Administered 2015-04-18 – 2015-04-20 (×3): 5 mg via ORAL
  Filled 2015-04-17 (×5): qty 1

## 2015-04-17 MED ORDER — ESMOLOL HCL 100 MG/10ML IV SOLN
INTRAVENOUS | Status: AC
  Filled 2015-04-17: qty 10

## 2015-04-17 MED ORDER — TACROLIMUS 1 MG PO CAPS
3.0000 mg | ORAL_CAPSULE | Freq: Two times a day (BID) | ORAL | Status: DC
  Administered 2015-04-17 – 2015-04-18 (×3): 3 mg via ORAL
  Administered 2015-04-19: 1 mg via ORAL
  Administered 2015-04-19: 2 mg via ORAL
  Administered 2015-04-19 – 2015-04-20 (×2): 3 mg via ORAL
  Filled 2015-04-17 (×4): qty 3
  Filled 2015-04-17: qty 2
  Filled 2015-04-17 (×3): qty 3

## 2015-04-17 MED ORDER — OXYCODONE HCL 5 MG PO TABS: 5.0000 mg | ORAL_TABLET | Freq: Once | ORAL | Status: DC | PRN

## 2015-04-17 MED ORDER — GUAIFENESIN-DM 100-10 MG/5ML PO SYRP: 15.0000 mL | ORAL_SOLUTION | ORAL | Status: DC | PRN

## 2015-04-17 MED ORDER — DEXTROSE 5 % IV SOLN: 1.5000 g | Freq: Once | INTRAVENOUS | Status: DC

## 2015-04-17 MED ORDER — ONDANSETRON HCL 4 MG/2ML IJ SOLN
INTRAMUSCULAR | Status: AC
  Filled 2015-04-17: qty 2

## 2015-04-17 MED ORDER — HEPARIN SODIUM (PORCINE) 1000 UNIT/ML IJ SOLN
INTRAMUSCULAR | Status: AC
  Filled 2015-04-17: qty 1

## 2015-04-17 MED ORDER — GABAPENTIN 300 MG PO CAPS
300.0000 mg | ORAL_CAPSULE | Freq: Three times a day (TID) | ORAL | Status: DC
  Administered 2015-04-17 – 2015-04-20 (×9): 300 mg via ORAL
  Filled 2015-04-17 (×9): qty 1

## 2015-04-17 MED ORDER — ALUM & MAG HYDROXIDE-SIMETH 200-200-20 MG/5ML PO SUSP: 15.0000 mL | ORAL | Status: DC | PRN

## 2015-04-17 MED ORDER — CEFUROXIME SODIUM 750 MG IJ SOLR
INTRAMUSCULAR | Status: AC
  Filled 2015-04-17: qty 750

## 2015-04-17 MED ORDER — PROTAMINE SULFATE 10 MG/ML IV SOLN
INTRAVENOUS | Status: DC | PRN
  Administered 2015-04-17: 50 mg via INTRAVENOUS

## 2015-04-17 MED ORDER — SODIUM CHLORIDE 0.45 % IV SOLN
INTRAVENOUS | Status: DC
  Administered 2015-04-17 – 2015-04-18 (×2): via INTRAVENOUS

## 2015-04-17 MED ORDER — SODIUM CHLORIDE 0.9 % IV SOLN
INTRAVENOUS | Status: DC | PRN
  Administered 2015-04-17: 10:00:00

## 2015-04-17 MED ORDER — PANTOPRAZOLE SODIUM 40 MG PO TBEC
40.0000 mg | DELAYED_RELEASE_TABLET | Freq: Every day | ORAL | Status: DC
  Administered 2015-04-18 – 2015-04-20 (×3): 40 mg via ORAL
  Filled 2015-04-17 (×3): qty 1

## 2015-04-17 MED ORDER — SODIUM CHLORIDE 0.9 % IV SOLN
INTRAVENOUS | Status: DC
  Administered 2015-04-17 (×2): via INTRAVENOUS
  Administered 2015-04-17: 10 mL/h via INTRAVENOUS
  Administered 2015-04-17: 16:00:00 via INTRAVENOUS

## 2015-04-17 MED ORDER — ACETAMINOPHEN 650 MG RE SUPP: 325.0000 mg | RECTAL | Status: DC | PRN

## 2015-04-17 MED ORDER — NIFEDIPINE ER OSMOTIC RELEASE 30 MG PO TB24
30.0000 mg | ORAL_TABLET | Freq: Every day | ORAL | Status: DC
  Administered 2015-04-18 – 2015-04-20 (×3): 30 mg via ORAL
  Filled 2015-04-17 (×3): qty 1

## 2015-04-17 MED ORDER — HYDROMORPHONE HCL 1 MG/ML IJ SOLN
INTRAMUSCULAR | Status: AC
  Administered 2015-04-17: 0.5 mg via INTRAVENOUS
  Filled 2015-04-17: qty 1

## 2015-04-17 MED ORDER — HYDRALAZINE HCL 20 MG/ML IJ SOLN: 5.0000 mg | INTRAMUSCULAR | Status: DC | PRN

## 2015-04-17 MED ORDER — MORPHINE SULFATE (PF) 4 MG/ML IV SOLN
INTRAVENOUS | Status: AC
  Filled 2015-04-17: qty 1

## 2015-04-17 MED ORDER — TAMSULOSIN HCL 0.4 MG PO CAPS
0.4000 mg | ORAL_CAPSULE | Freq: Every day | ORAL | Status: DC
  Administered 2015-04-18 – 2015-04-20 (×3): 0.4 mg via ORAL
  Filled 2015-04-17 (×3): qty 1

## 2015-04-17 MED ORDER — ONDANSETRON HCL 4 MG/2ML IJ SOLN
INTRAMUSCULAR | Status: DC | PRN
  Administered 2015-04-17: 4 mg via INTRAVENOUS

## 2015-04-17 MED ORDER — LACTATED RINGERS IV SOLN: INTRAVENOUS | Status: DC | PRN

## 2015-04-17 MED ORDER — ACETAMINOPHEN 325 MG PO TABS
325.0000 mg | ORAL_TABLET | ORAL | Status: DC | PRN
  Administered 2015-04-18: 325 mg via ORAL
  Administered 2015-04-19 (×2): 650 mg via ORAL
  Filled 2015-04-17 (×2): qty 2
  Filled 2015-04-17: qty 1

## 2015-04-17 MED ORDER — MYCOPHENOLATE MOFETIL 250 MG PO CAPS
500.0000 mg | ORAL_CAPSULE | Freq: Two times a day (BID) | ORAL | Status: DC
  Administered 2015-04-17 – 2015-04-20 (×6): 500 mg via ORAL
  Filled 2015-04-17 (×7): qty 2

## 2015-04-17 MED ORDER — GLYCOPYRROLATE 0.2 MG/ML IJ SOLN
INTRAMUSCULAR | Status: AC
  Filled 2015-04-17: qty 2

## 2015-04-17 MED ORDER — FINASTERIDE 5 MG PO TABS
5.0000 mg | ORAL_TABLET | Freq: Every day | ORAL | Status: DC
  Administered 2015-04-18 – 2015-04-20 (×3): 5 mg via ORAL
  Filled 2015-04-17 (×3): qty 1

## 2015-04-17 MED ORDER — GLYCOPYRROLATE 0.2 MG/ML IJ SOLN
INTRAMUSCULAR | Status: DC | PRN
  Administered 2015-04-17: 0.4 mg via INTRAVENOUS

## 2015-04-17 MED ORDER — HEPARIN SODIUM (PORCINE) 1000 UNIT/ML IJ SOLN
INTRAMUSCULAR | Status: DC | PRN
  Administered 2015-04-17 (×2): 2000 [IU] via INTRAVENOUS
  Administered 2015-04-17: 7000 [IU] via INTRAVENOUS
  Administered 2015-04-17: 2000 [IU] via INTRAVENOUS

## 2015-04-17 MED ORDER — OXYCODONE HCL 5 MG/5ML PO SOLN: 5.0000 mg | Freq: Once | ORAL | Status: DC | PRN

## 2015-04-17 MED ORDER — ACETAMINOPHEN 325 MG PO TABS: 325.0000 mg | ORAL_TABLET | ORAL | Status: DC | PRN

## 2015-04-17 MED ORDER — NEOSTIGMINE METHYLSULFATE 10 MG/10ML IV SOLN
INTRAVENOUS | Status: DC | PRN
  Administered 2015-04-17: 3 mg via INTRAVENOUS

## 2015-04-17 MED ORDER — HYDROMORPHONE HCL 1 MG/ML IJ SOLN
0.2500 mg | INTRAMUSCULAR | Status: DC | PRN
  Administered 2015-04-17 (×2): 0.5 mg via INTRAVENOUS

## 2015-04-17 MED ORDER — PROTAMINE SULFATE 10 MG/ML IV SOLN
INTRAVENOUS | Status: AC
  Filled 2015-04-17: qty 10

## 2015-04-17 MED ORDER — METOPROLOL TARTRATE 1 MG/ML IV SOLN: 2.0000 mg | INTRAVENOUS | Status: DC | PRN

## 2015-04-17 MED ORDER — OXYCODONE HCL 5 MG PO TABS
5.0000 mg | ORAL_TABLET | ORAL | Status: DC | PRN
  Administered 2015-04-17: 10 mg via ORAL
  Administered 2015-04-18: 5 mg via ORAL
  Administered 2015-04-18 – 2015-04-20 (×10): 10 mg via ORAL
  Filled 2015-04-17 (×10): qty 2
  Filled 2015-04-17: qty 1
  Filled 2015-04-17: qty 2

## 2015-04-17 MED ORDER — LABETALOL HCL 5 MG/ML IV SOLN: 10.0000 mg | INTRAVENOUS | Status: DC | PRN

## 2015-04-17 MED ORDER — NEOSTIGMINE METHYLSULFATE 10 MG/10ML IV SOLN
INTRAVENOUS | Status: AC
  Filled 2015-04-17: qty 1

## 2015-04-17 MED ORDER — EPHEDRINE SULFATE 50 MG/ML IJ SOLN
INTRAMUSCULAR | Status: DC | PRN
  Administered 2015-04-17 (×3): 5 mg via INTRAVENOUS
  Administered 2015-04-17: 10 mg via INTRAVENOUS

## 2015-04-17 SURGICAL SUPPLY — 70 items
AGENT HMST SPONGE THK3/8 (HEMOSTASIS) ×2
CANISTER SUCTION 2500CC (MISCELLANEOUS) ×3 IMPLANT
CANNULA VESSEL 3MM 2 BLNT TIP (CANNULA) ×1 IMPLANT
CATH EMB 4FR 80CM (CATHETERS) ×1 IMPLANT
CATH EMB 5FR 80CM (CATHETERS) ×1 IMPLANT
CLIP TI MEDIUM 24 (CLIP) ×3 IMPLANT
CLIP TI WIDE RED SMALL 24 (CLIP) ×3 IMPLANT
COVER PROBE W GEL 5X96 (DRAPES) ×3 IMPLANT
DRAIN CHANNEL 15F RND FF W/TCR (WOUND CARE) IMPLANT
DRAPE C-ARM 42X72 X-RAY (DRAPES) ×2 IMPLANT
ELECT REM PT RETURN 9FT ADLT (ELECTROSURGICAL) ×3
ELECTRODE REM PT RTRN 9FT ADLT (ELECTROSURGICAL) ×2 IMPLANT
EVACUATOR SILICONE 100CC (DRAIN) IMPLANT
GAUZE SPONGE 4X4 16PLY XRAY LF (GAUZE/BANDAGES/DRESSINGS) ×1 IMPLANT
GLOVE BIO SURGEON STRL SZ 6.5 (GLOVE) ×3 IMPLANT
GLOVE BIO SURGEON STRL SZ7 (GLOVE) ×3 IMPLANT
GLOVE BIOGEL PI IND STRL 6.5 (GLOVE) IMPLANT
GLOVE BIOGEL PI IND STRL 7.0 (GLOVE) IMPLANT
GLOVE BIOGEL PI IND STRL 7.5 (GLOVE) ×2 IMPLANT
GLOVE BIOGEL PI INDICATOR 6.5 (GLOVE) ×8
GLOVE BIOGEL PI INDICATOR 7.0 (GLOVE) ×4
GLOVE BIOGEL PI INDICATOR 7.5 (GLOVE) ×2
GLOVE ECLIPSE 6.0 STRL STRAW (GLOVE) IMPLANT
GLOVE ECLIPSE 6.5 STRL STRAW (GLOVE) ×1 IMPLANT
GLOVE SS BIOGEL STRL SZ 6.5 (GLOVE) IMPLANT
GLOVE SUPERSENSE BIOGEL SZ 6.5 (GLOVE) ×3
GLOVE SURG SS PI 6.5 STRL IVOR (GLOVE) ×1 IMPLANT
GOWN STRL REUS W/ TWL LRG LVL3 (GOWN DISPOSABLE) ×6 IMPLANT
GOWN STRL REUS W/ TWL XL LVL3 (GOWN DISPOSABLE) IMPLANT
GOWN STRL REUS W/TWL LRG LVL3 (GOWN DISPOSABLE) ×24
GOWN STRL REUS W/TWL XL LVL3 (GOWN DISPOSABLE) ×4 IMPLANT
HEMOSTAT SPONGE AVITENE ULTRA (HEMOSTASIS) ×1 IMPLANT
KIT BASIN OR (CUSTOM PROCEDURE TRAY) ×3 IMPLANT
KIT ROOM TURNOVER OR (KITS) ×3 IMPLANT
LIQUID BAND (GAUZE/BANDAGES/DRESSINGS) ×2 IMPLANT
MARKER SKIN DUAL TIP RULER LAB (MISCELLANEOUS) ×1 IMPLANT
NS IRRIG 1000ML POUR BTL (IV SOLUTION) ×7 IMPLANT
PACK PERIPHERAL VASCULAR (CUSTOM PROCEDURE TRAY) ×3 IMPLANT
PAD ARMBOARD 7.5X6 YLW CONV (MISCELLANEOUS) ×6 IMPLANT
PATCH VASC XENOSURE 1CMX6CM (Vascular Products) ×3 IMPLANT
PATCH VASC XENOSURE 1X6 (Vascular Products) IMPLANT
SPONGE LAP 18X18 X RAY DECT (DISPOSABLE) ×1 IMPLANT
STAPLER VISISTAT 35W (STAPLE) IMPLANT
STOPCOCK 4 WAY LG BORE MALE ST (IV SETS) ×2 IMPLANT
SUT ETHILON 3 0 PS 1 (SUTURE) IMPLANT
SUT GORETEX 5 0 TT13 24 (SUTURE) IMPLANT
SUT GORETEX 6.0 TT13 (SUTURE) IMPLANT
SUT MNCRL AB 4-0 PS2 18 (SUTURE) ×12 IMPLANT
SUT PROLENE 5 0 C 1 24 (SUTURE) ×4 IMPLANT
SUT PROLENE 6 0 BV (SUTURE) ×9 IMPLANT
SUT PROLENE 7 0 BV 1 (SUTURE) ×1 IMPLANT
SUT PROLENE 7 0 BV1 MDA (SUTURE) ×1 IMPLANT
SUT SILK 2 0 (SUTURE) ×3
SUT SILK 2 0 FS (SUTURE) IMPLANT
SUT SILK 2 0 SH (SUTURE) ×1 IMPLANT
SUT SILK 2-0 18XBRD TIE 12 (SUTURE) IMPLANT
SUT SILK 3 0 (SUTURE) ×6
SUT SILK 3-0 18XBRD TIE 12 (SUTURE) IMPLANT
SUT SILK 4 0 (SUTURE) ×3
SUT SILK 4-0 18XBRD TIE 12 (SUTURE) IMPLANT
SUT VIC AB 2-0 CT1 27 (SUTURE) ×6
SUT VIC AB 2-0 CT1 TAPERPNT 27 (SUTURE) ×4 IMPLANT
SUT VIC AB 3-0 SH 27 (SUTURE) ×24
SUT VIC AB 3-0 SH 27X BRD (SUTURE) ×6 IMPLANT
SYR 3ML LL SCALE MARK (SYRINGE) ×2 IMPLANT
TAPE STRIPS DRAPE STRL (GAUZE/BANDAGES/DRESSINGS) ×1 IMPLANT
TRAY FOLEY W/METER SILVER 16FR (SET/KITS/TRAYS/PACK) ×3 IMPLANT
TUBING EXTENTION W/L.L. (IV SETS) IMPLANT
UNDERPAD 30X30 INCONTINENT (UNDERPADS AND DIAPERS) ×3 IMPLANT
WATER STERILE IRR 1000ML POUR (IV SOLUTION) ×3 IMPLANT

## 2015-04-17 NOTE — Op Note (Signed)
OPERATIVE NOTE   PROCEDURE: 1.  Right iliofemoral endarterectomy with bovine patch angioplasty 2.  Extended right profundaplasty 3.  Right common femoral artery to below-the-knee popliteal artery bypass with non-reversed ipsilateral greater saphenous vein   PRE-OPERATIVE DIAGNOSIS: right leg lifestyle limiting intermittent claudication, severe common femoral artery disease  POST-OPERATIVE DIAGNOSIS: same as above   SURGEON: [name], MD  ASSISTANT(S): Silva Bandy, PAC   ANESTHESIA: general  ESTIMATED BLOOD LOSS: 100 cc  FINDING(S): 1.  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  Scarred right retroperitoneum s/p right kidney transplant 3.  Biphasic posterior tibial artery signal at end of case: dampened monophasic with bypass compression  SPECIMEN(S):  none  INDICATIONS:   Scott Chavez is a 59 y.o. male who presents with intermittent claudication preventing his ambulation between his college classes.  His prior endovascular intervention on the right superficial femoral artery had occluded and he had a severe stenosis in the common femoral artery that would require surgical intervention.  I recommended: Right iliofemoral endarterectomy with bovine patch angioplasty and extended profundaplasty and right femoropopliteal bypass.  The risk, benefits, and alternative for bypass operations were discussed with the patient.  The patient is aware the risks include but are not limited to: bleeding, infection, myocardial infarction, stroke, limb loss, nerve damage, need for additional procedures in the future, wound complications, and inability to complete the bypass.  The patient is aware of these risks and agreed to proceed.   DESCRIPTION: After full informed written consent was obtained, the patient was brought back to the operating room and placed supine upon the operating table.  Prior to induction, the patient was given intravenous  antibiotics.  After obtaining adequate anesthesia, the patient was prepped and draped in the standard fashion for a femoral to popliteal bypass operation.  Attention was turned to the right groin.  A longitudinal incision was made over the right common femoral artery.  Using blunt dissection and electrocautery, the artery was dissected out from the inguinal ligament down to the femoral bifurcation.  The superficial femoral artery, profunda femoral artery, and external iliac artery were dissected out and vessel loops applied.  Circumflex branches were also dissected and controlled with silk ties.  This common femoral artery was found on exam to be severely calcified and not compressible.  I dissected under the inguinal ligament with some difficulty due to scarring from his prior kidney transplant.  Eventually, I found a segment of the external iliac artery that was partially compressible.  I did not want to dissect too far proximal as I did not want to chance interrupting the renal artery anastomosis to the right external iliac artery.  I also dissected out the profunda femoral artery down to the first order bifurcation.    The patient was given 7000 units of Heparin intravenously, which was a therapeutic bolus. An additional 2000 units of Heparin was administered every hour after initial bolus to maintain anticoagulation.  In total, 14000 units of Heparin was administrated to achieve and maintain a therapeutic level of anticoagulation.  After waiting 3 minutes, I clamped the distal right external iliac artery and placed the profunda femoral artery branches under tension with vessel loops.  I tried to make an incision in the common femoral artery but the 11-blade would not penetrate the artery.  I cracked the artery with a hemostat and then used a Potts scissor to extend the arteriotomy proximally into the distal external iliac artery and distally  into the first order profunda femoral artery.  There was a slow ooze  from the artery which I controlled by readjusting the distal external iliac artery clamp.  I started the endarterectomy in the mid-segment with a Technical brewer.  I cracked the calcification with a Mayo scissor and then carried the dissected into the distal external iliac artery.  I was forced to extract this proximal calcified plaque in pieces as the plaque began to splinter.  I extracted all loose disease up to the level of the clamp.  I did not feel comfortable dissecting more proximal as I did not want to cause a retrograde dissecting into the transplant renal artery.  I briefly released the clamp and there good pulsatile bleeding.  I move the clamp slightly more distally.  I continued the endarterectomy distally.  In this process, the superficial femoral artery essential partial tore off the femoral bifurcation.  I transected the chronically occluded superficial femoral artery, resecting a 4 cm segment.  I tied off this superficial femoral artery with a 2-0 silk, but there was essentially no flow.  I sharply fashioned the common femoral artery to improve the geometry for a patch angioplasty.  I continued the endarterectomy into the first order profunda femoral artery branches.  I was able to get a good end point in each branch.  There was excellent backbleeding from the medial branch and intact acceptable bleeding from the lateral branch.  After re-inspecting the iliofemoral arteriotomy, I felt the residual disease was too adherent to remove without making a hole in the artery.  I obtained a previously soaked bovine pericardial patch.  I sewed the patch from the distal external iliac artery down into the first order profunda femoral artery with two running stitches of 6-0 Prolene.  Prior to completing this patch angioplasty and extended profundaplasty, I backbled all arteries.  There was no thrombus.  I completed the patch angioplasty in the usual fashion.  At this point, attention was turned to the  calf.  An longitudinal incision was made one finger-width posterior to the tibia.  Using blunt dissection and electrocautery, a plane was developed through the subcutaneous tissue and fascia down to the popliteal space.  The popliteal vein was dissected out and retracted medially and posteriorly.  The below-the-knee popliteal artery was dissected away from lateral popliteal vein.  This popliteal artery was found on exam to be diseased but not calcified.    In the process of the popliteal artery dissection, I found the right greater saphenous vein.  Skip incisions was made over the greater saphenous vein from the saphenofemoral junction down to calf.  The vein conduit was found to be adequate with size: 3-5 mm.  Side branches of greater saphenous vein were tied off with 4-0 silk or clipped with small titanium clips.  The saphenofemoral junction was clamped and the greater saphenous vein was transected.  The saphenofemoral junction was oversewn in a double layer with a running stitch of 5-0 prolene.  The distal extent of this conduit was tied off at the level of distal calf incision and the conduit transected proximally.  The harvest vein conduit was soaked in a heparinized saline solution.  A vessel cannula was tied to the distal end of the vein and the vein was tested by hydrodistension.  Leaks in the conduit were repaired with 4-0 silk ties and 7-0 Prolene stitches.  At the end of this process, I felt the conduit to be:  adequate quality.  There was  obvious evidence of prior venous sclerosis in the distal vein.  The proximal 4/5 of the vein appeared to be adequate.  I sharply lysed the proximal valves in this vein conduit.  At this point, I clamped the external iliac artery and profunda femoral artery branches.  An incision was made in the bovine patch overlying the common femoral artery and extended proximally and distally with a Potts scissor.  The proximal conduit was spatulated to the dimensions of the  arteriotomy.  The non-reversed greater saphenous vein conduit was sewn to the common femoral artery with a running stitch of 5-0 prolene in an end-to-side configuration .  Note, big stitches were taken due to thickening of the proximal vein conduit wall.  Prior to completing this anastomosis, all vessels were backbled.   No thrombus was noted from any vessels and backbleeding was: vigorous from the medial profunda branch and pulsatile from the external iliac artery.  The anastomosis was completed in the usual fashion.  At this point, I used a valvutome to lyse residual valves.  After two passes, there were no further competent valves.  A few branch points had to be reinforced with 7-0 stitches due to development of bleeding during the valve lysis.    At this point, I bluntly dissected a space between the femoral condyles adjacent to the below-the-knee popliteal vessels.  I bluntly passed the long Gore metal tunneler between the femoral condyles in a subsartorial fashion to the groin incision.  The bullet on the tunneler was removed and then the vein conduit was sewn to the inner cannula with a 2-0 Silk.  I passed the conduit through the metal tunnel, taking care to maintain the orientation of the conduit.  I removed the metal tunnel, leaving the vein conduit in place with desired orientation.  Note, there was some increased venous bleeding noted from the tunnel.  There was no evidence of pulsatile bleeding or high volume bleeding at this point.  Attention was then turned to the popliteal exposure.   I reset the exposure of the popliteal space.  I verified the popliteal artery was appropriately marked.  I determine the target segment for the anastomosis.  I applied tension to the artery proximally and distally with vessel loops.  I made an incision with a 11-blade in the artery and extended it proximally and distally with a Potts scissor.  The popliteal artery was found to calcified plaque without significant  stenosis present.   I pulled the conduit to appropriate tension and length, taking into account straightening out the leg.  I adjusted the length of the conduit sharply.  I spatulated this conduit to meet the dimensions of the arteriotomy.  The conduit was sewn to the common femoral artery with a running stitch of 6 prolene.  Prior to completing this anastomosis, all vessels were backbled.  No thrombus was noted from any vessels and backbleeding was: vigorous.  The bypass conduit was allowed to bleed in an antegrade fashion.  The bleeding was: pulsatile.  The anastomosis was completed in the usual fashion.  At this point, all incisions were washed out and Avitene was placed into both incisions.  The continuous doppler exam demonstrated distally: biphasic posterior tibial artery signal which deteriorate to a dampened monophasic signal with bypass occlusion.    Attention was turned to the right groin.  A loose area in the suture line of the patch and the anastomosis were found with interrogating with a nerve hook.  I tightened the sutures  of both with 6-0 Prolene sutures.  There was no further active bleeding.  The groin was repaired with a double layer of 2-0 Vicryl immediately superficial to the bypass conduit.  The superficial subcutaneous tissue was reapproximated with a double layer of 3-0 Vicryl.  The skin was reapproximated with a running subcuticular of 4-0 Vicryl.  The skin was cleaned, dried, and reinforced with Dermabond.  I turned my attention to the calf.  There appeared to be active bleeding present.  After some exploration, I found a small side branch of a geniculate artery that was actively bleeding.  I tied off this branch and washed out the calf.  There was no active bleeding.  The anastomosis appeared to be dry.  The calf was repaired with a double layer 3-0 Vicryl in the subcutaneous tissue.  The skin was reapproximated with a running subcuticular of 4-0 Monocryl.  After cleaning and drying  the skin, the skin was reinforced with Dermabond.  The vein harvest incisions were closed with a layer of 3-0 Vicryl in the subcutaneous tissue.  The skin in each incision was then reapproximated with a running subcuticular stitch of 4-0 Monocryl.  The skin was cleaned, dried, and Dermabond applied to reinforce the skin closure.    COMPLICATIONS: none  CONDITION: stable   [name], MD [facility] Office: [phone] Pager: [phone]  04/17/2015, 5:11 PM

## 2015-04-17 NOTE — Anesthesia Preprocedure Evaluation (Addendum)
Anesthesia Evaluation  Patient identified by MRN, date of birth, ID band Patient awake    Reviewed: Allergy & Precautions, NPO status , Patient's Chart, lab work & pertinent test results  Airway Mallampati: II  TM Distance: >3 FB Neck ROM: Full    Dental no notable dental hx.    Pulmonary neg shortness of breath, neg sleep apnea, neg recent URI, Current Smoker, neg PE   breath sounds clear to auscultation       Cardiovascular hypertension, (-) angina+ Peripheral Vascular Disease  (-) Past MI  Rhythm:Regular     Neuro/Psych negative neurological ROS  negative psych ROS   GI/Hepatic GERD  Controlled,  Endo/Other    Renal/GU Renal InsufficiencyRenal disease     Musculoskeletal   Abdominal   Peds  Hematology   Anesthesia Other Findings   Reproductive/Obstetrics                            Anesthesia Physical Anesthesia Plan  ASA: III  Anesthesia Plan: General   Post-op Pain Management:    Induction: Intravenous  Airway Management Planned: Oral ETT  Additional Equipment: None  Intra-op Plan:   Post-operative Plan: Extubation in OR  Informed Consent: I have reviewed the patients History and Physical, chart, labs and discussed the procedure including the risks, benefits and alternatives for the proposed anesthesia with the patient or authorized representative who has indicated his/her understanding and acceptance.   Dental advisory given  Plan Discussed with: CRNA and Surgeon  Anesthesia Plan Comments:        Anesthesia Quick Evaluation

## 2015-04-17 NOTE — Anesthesia Postprocedure Evaluation (Signed)
Anesthesia Post Note  Patient: Scott Chavez  Procedure(s) Performed: Procedure(s) (LRB): ENDARTERECTOMY RIGHT ILIOFEMORAL WITH PROFUNDOPLASTY (Right) BYPASS GRAFT RIGHT FEMORALTO BELOW KNEE POPLITEAL ARTERY USING RIGHT NON REVERSED GREATER SAPPHENOUS VEIN (Right) XENOSURE PATCH ANGIOPLASTY, RIGHT ILEOFEMORAL (Right) RIGHT GREATER SAPPHENOUS VEIN HARVEST  (Right)  Patient location during evaluation: PACU Anesthesia Type: General Level of consciousness: awake and alert Pain management: pain level controlled Vital Signs Assessment: post-procedure vital signs reviewed and stable Respiratory status: spontaneous breathing, nonlabored ventilation and respiratory function stable Cardiovascular status: blood pressure returned to baseline and stable Postop Assessment: no signs of nausea or vomiting Anesthetic complications: no    Last Vitals:  Filed Vitals:   04/17/15 1853 04/17/15 1925  BP:  117/71  Pulse:  71  Temp: 36.4 C   Resp:  21    Last Pain:  Filed Vitals:   04/17/15 2030  PainSc: 9                  Goerge Mohr A

## 2015-04-17 NOTE — Progress Notes (Signed)
Patient transferred to 3S08.  Pt's fiance in room with his belongings.  Pt oriented to room and call bell placed within reach.  VSS.  Will continue to monitor.
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
## 2015-04-17 NOTE — Interval H&P Note (Signed)
History and Physical Interval Note:  04/17/2015 8:30 AM  Scott Chavez  has presented today for surgery, with the diagnosis of Peripheral vascular disease with right lower extremity claudication I70.211  The various methods of treatment have been discussed with the patient and family. After consideration of risks, benefits and other options for treatment, the patient has consented to  Procedure(s): ENDARTERECTOMY ILIOFEMORAL WITH PROFUNDOPLASTY (Right) BYPASS GRAFT FEMORAL-POPLITEAL ARTERY (Right) as a surgical intervention .  The patient's history has been reviewed, patient examined, no change in status, stable for surgery.  I have reviewed the patient's chart and labs.  Questions were answered to the patient's satisfaction.     [name]

## 2015-04-17 NOTE — Transfer of Care (Signed)
Immediate Anesthesia Transfer of Care Note  Patient: AKIM STEGER  Procedure(s) Performed: Procedure(s): ENDARTERECTOMY RIGHT ILIOFEMORAL WITH PROFUNDOPLASTY (Right) BYPASS GRAFT RIGHT FEMORALTO BELOW KNEE POPLITEAL ARTERY USING RIGHT NON REVERSED GREATER SAPPHENOUS VEIN (Right) XENOSURE PATCH ANGIOPLASTY, RIGHT ILEOFEMORAL (Right) RIGHT GREATER SAPPHENOUS VEIN HARVEST  (Right)  Patient Location: PACU  Anesthesia Type:General  Level of Consciousness: awake, alert , oriented and patient cooperative  Airway & Oxygen Therapy: Patient Spontanous Breathing and Patient connected to face mask oxygen  Post-op Assessment: Report given to RN, Post -op Vital signs reviewed and stable, Patient moving all extremities and Patient moving all extremities X 4  Post vital signs: Reviewed and stable  Last Vitals:  Filed Vitals:   04/17/15 0822 04/17/15 1725  BP: 161/86   Pulse: 61   Temp: 36.3 C 36.5 C  Resp: 16     Complications: No apparent anesthesia complications

## 2015-04-17 NOTE — Anesthesia Procedure Notes (Signed)
Procedure Name: Intubation Date/Time: 04/17/2015 10:36 AM Performed by: Scheryl Darter Pre-anesthesia Checklist: Patient identified, Emergency Drugs available, Suction available, Patient being monitored and Timeout performed Patient Re-evaluated:Patient Re-evaluated prior to inductionOxygen Delivery Method: Circle system utilized Preoxygenation: Pre-oxygenation with 100% oxygen Intubation Type: IV induction Laryngoscope Size: 2 and Miller Grade View: Grade I Tube type: Oral Tube size: 7.5 mm Number of attempts: 1 Airway Equipment and Method: Stylet Placement Confirmation: ETT inserted through vocal cords under direct vision,  positive ETCO2 and breath sounds checked- equal and bilateral Secured at: 22 cm Tube secured with: Tape Dental Injury: Teeth and Oropharynx as per pre-operative assessment

## 2015-04-18 ENCOUNTER — Inpatient Hospital Stay (HOSPITAL_COMMUNITY): Payer: Medicare Other

## 2015-04-18 ENCOUNTER — Encounter (HOSPITAL_COMMUNITY): Payer: Self-pay | Admitting: Vascular Surgery

## 2015-04-18 DIAGNOSIS — I70211 Atherosclerosis of native arteries of extremities with intermittent claudication, right leg: Secondary | ICD-10-CM

## 2015-04-18 LAB — CBC
HEMATOCRIT: 33.8 % — AB (ref 39.0–52.0)
HEMOGLOBIN: 11 g/dL — AB (ref 13.0–17.0)
MCH: 30.4 pg (ref 26.0–34.0)
MCHC: 32.5 g/dL (ref 30.0–36.0)
MCV: 93.4 fL (ref 78.0–100.0)
Platelets: 135 10*3/uL — ABNORMAL LOW (ref 150–400)
RBC: 3.62 MIL/uL — ABNORMAL LOW (ref 4.22–5.81)
RDW: 13.7 % (ref 11.5–15.5)
WBC: 3.9 10*3/uL — ABNORMAL LOW (ref 4.0–10.5)

## 2015-04-18 LAB — BASIC METABOLIC PANEL
Anion gap: 7 (ref 5–15)
BUN: 8 mg/dL (ref 6–20)
CALCIUM: 9 mg/dL (ref 8.9–10.3)
CHLORIDE: 108 mmol/L (ref 101–111)
CO2: 22 mmol/L (ref 22–32)
CREATININE: 1.16 mg/dL (ref 0.61–1.24)
GFR calc Af Amer: >60 mL/min (ref >60)
GLUCOSE: 98 mg/dL (ref 65–99)
Potassium: 4 mmol/L (ref 3.5–5.1)
SODIUM: 137 mmol/L (ref 135–145)

## 2015-04-18 MED ORDER — ASPIRIN 325 MG PO TABS
325.0000 mg | ORAL_TABLET | Freq: Two times a day (BID) | ORAL | Status: DC
  Administered 2015-04-18 – 2015-04-20 (×5): 325 mg via ORAL
  Filled 2015-04-18 (×5): qty 1

## 2015-04-18 MED ORDER — OXYCODONE HCL 5 MG PO TABS: 5.0000 mg | ORAL_TABLET | ORAL | Status: DC | PRN

## 2015-04-18 NOTE — Evaluation (Signed)
Physical Therapy Evaluation Patient Details Name: Scott Chavez MRN: [id number] DOB: April 26, 1956 Today's Date: 04/18/2015   History of Present Illness  Pt admitted for R fem pop bypass graft.  Clinical Impression  Pt admitted with above diagnosis. Pt currently with functional limitations due to the deficits listed below (see PT Problem List). Pt overall mobilizing well and likely will not need follow up services. Pt will benefit from skilled PT to increase their independence and safety with mobility to allow discharge to the venue listed below.       Follow Up Recommendations No PT follow up    Equipment Recommendations  Rolling walker with 5" wheels (pt thinks he has RW at home)    Recommendations for Other Services       Precautions / Restrictions Precautions Precautions: Fall Restrictions Weight Bearing Restrictions: No      Mobility  Bed Mobility Overal bed mobility: Modified Independent             General bed mobility comments: with bed rail and HOB elevated  Transfers Overall transfer level: Needs assistance Equipment used: Rolling walker (2 wheeled) Transfers: Sit to/from Stand Sit to Stand: Min guard         General transfer comment: min cues for technique  Ambulation/Gait Ambulation/Gait assistance: Min guard Ambulation Distance (Feet): 30 Feet Assistive device: Rolling walker (2 wheeled) Gait Pattern/deviations: Step-to pattern   Gait velocity interpretation: Below normal speed for age/gender General Gait Details: pt working on decreasing UE support  Financial trader Rankin (Stroke Patients Only)       Balance                                             Pertinent Vitals/Pain Pain Assessment: 0-10 Pain Score: 8  Pain Location: RLE Pain Descriptors / Indicators: Sharp Pain Intervention(s): Limited activity within patient's tolerance;Monitored during  session;Repositioned;Premedicated before session    Home Living Family/patient expects to be discharged to:: Private residence Living Arrangements: Spouse/significant other (fiance) Available Help at Discharge: Family;Available 24 hours/day Type of Home: House Home Access: Stairs to enter Entrance Stairs-Rails: None Entrance Stairs-Number of Steps: 3 Home Layout: One level Home Equipment: None      Prior Function Level of Independence: Independent               Hand Dominance        Extremity/Trunk Assessment               Lower Extremity Assessment: Generalized weakness         Communication   Communication: No difficulties  Cognition Arousal/Alertness: Awake/alert   Overall Cognitive Status: Within Functional Limits for tasks assessed                      General Comments      Exercises        Assessment/Plan    PT Assessment Patient needs continued PT services  PT Diagnosis Difficulty walking;Generalized weakness;Acute pain   PT Problem List Decreased strength;Decreased activity tolerance;Decreased balance;Decreased mobility;Decreased knowledge of use of DME;Pain  PT Treatment Interventions DME instruction;Gait training;Stair training;Functional mobility training;Therapeutic activities;Therapeutic exercise;Patient/family education   PT Goals (Current goals can be found in the Care Plan section) Acute Rehab PT Goals Patient Stated Goal: to go home  and decrease pain PT Goal Formulation: With patient Time For Goal Achievement: 04/25/15 Potential to Achieve Goals: Good    Frequency Min 3X/week   Barriers to discharge        Co-evaluation               End of Session Equipment Utilized During Treatment: Gait belt Activity Tolerance: Patient tolerated treatment well;No increased pain Patient left: in chair;with call bell/phone within reach Nurse Communication: Mobility status         Time: [id number] PT Time Calculation  (min) (ACUTE ONLY): 33 min   Charges:   PT Evaluation $Initial PT Evaluation Tier I: 1 Procedure PT Treatments $Gait Training: 8-22 mins $Therapeutic Activity: 8-22 mins   PT G Codes:          Laureen Abrahams, PT, DPT 04/18/2015 1:59 PM [id number]

## 2015-04-18 NOTE — Progress Notes (Signed)
Pt to TX to 2W-32, VSS, called report. Family present & aware of [place].

## 2015-04-18 NOTE — Progress Notes (Signed)
VASCULAR LAB PRELIMINARY  ARTERIAL  ABI completed:    RIGHT    LEFT    PRESSURE WAVEFORM  PRESSURE WAVEFORM  BRACHIAL 136 Triphasic BRACHIAL Restricted Limb Triphasic  DP 101 Biphasic DP 133 Triphasic  PT 114 Biphasic PT 116 Biphasic    RIGHT LEFT  ABI 0.84 0.98   Post operative ABIs indicate a mild reduction in arterial flow on the right and normal arterial flow on the left at rest  Serin Thornell, RVS 04/18/2015, 7:30 PM

## 2015-04-18 NOTE — Progress Notes (Addendum)
  [facility] Progress Note  Subjective  - POD #1  Soreness with incisions  Objective Filed Vitals:   04/17/15 2355 04/18/15 0405  BP: 136/81 122/76  Pulse: 60 81  Temp: 98.4 F (36.9 C) 98.2 F (36.8 C)  Resp: 16 16    Intake/Output Summary (Last 24 hours) at 04/18/15 0733 Last data filed at 04/18/15 0405  Gross per 24 hour  Intake 3412.5 ml  Output   2165 ml  Net 1247.5 ml    Right groin and right leg incisions clean and intact. Biphasic right PT doppler signal  Assessment/Planning: 59 y.o. male is s/p:  1. Right iliofemoral endarterectomy with bovine patch angioplasty 2. Extended right profundaplasty 3. Right common femoral artery to below-the-knee popliteal artery bypass with non-reversed ipsilateral greater saphenous vein  1 Day Post-Op   Bypass is patent. Stable post-op. Good UOP. Creatinine stable.  PT/OT today. Mobilize.  Transfer to 2W.  Dispo: Home when increasing mobilization and pain well-controlled on po's. Anticipate next 1-2 days.   Alvia Grove 04/18/2015 7:33 AM --  Laboratory CBC    Component Value Date/Time   WBC 3.9* 04/18/2015 0416   HGB 11.0* 04/18/2015 0416   HCT 33.8* 04/18/2015 0416   PLT 135* 04/18/2015 0416    BMET    Component Value Date/Time   NA 137 04/18/2015 0416   K 4.0 04/18/2015 0416   CL 108 04/18/2015 0416   CO2 22 04/18/2015 0416   GLUCOSE 98 04/18/2015 0416   BUN 8 04/18/2015 0416   CREATININE 1.16 04/18/2015 0416   CALCIUM 9.0 04/18/2015 0416   GFRNONAA >60 04/18/2015 0416   GFRAA >60 04/18/2015 0416    COAG Lab Results  Component Value Date   INR 1.03 04/16/2015   INR 0.92 04/19/2013   No results found for: PTT  Antibiotics Anti-infectives    Start     Dose/Rate Route Frequency Ordered Stop   04/17/15 2000  cefUROXime (ZINACEF) 1.5 g in dextrose 5 % 50 mL IVPB     1.5 g 100 mL/hr over 30 Minutes Intravenous Every 12 hours 04/17/15 1926 04/18/15 1959   04/17/15 1545   cefUROXime (ZINACEF) 1.5 g in dextrose 5 % 50 mL IVPB  Status:  Discontinued     1.5 g 100 mL/hr over 30 Minutes Intravenous  Once 04/17/15 1536 04/17/15 1913   04/17/15 0801  cefUROXime (ZINACEF) 1.5 g in dextrose 5 % 50 mL IVPB     1.5 g 100 mL/hr over 30 Minutes Intravenous 30 min pre-op 04/17/15 0801 04/17/15 [place], PA-C [facility] Office: [phone] Pager: [phone] 04/18/2015 7:33 AM   Addendum  I have independently interviewed and examined the patient, and I agree with the physician assistant's findings.  Ok to transfer to 2W,  Expect to go home sometime over the next few days.  [name], MD [facility] [place] Office: [phone] Pager: [phone]  04/18/2015, 12:39 PM

## 2015-04-18 NOTE — Progress Notes (Signed)
OT Cancellation Note  Patient Details Name: Scott Chavez MRN: [id number] DOB: 1955-08-15   Cancelled Treatment:    Reason Eval/Treat Not Completed: OT screened, no needs identified, will sign off  [facility], OTR/L  [id number] 04/18/2015 04/18/2015, 4:02 PM

## 2015-04-18 NOTE — Care Management Note (Addendum)
Case Management Note  Patient Details  Name: Scott Chavez MRN: [id number] Date of Birth: 06-21-1955  Subjective/Objective:                 Admitted with right leg lifestyle limiting intermittent claudication, severe common femoral artery disease, s/p below-the-knee popliteal artery bypass 04/17/15.From home with fiance'.Independent with ADL's pta, no DME usage.   Action/Plan: Pending PT/OT evals....... CM to f/u with disposition needs.  Expected Discharge Date:                  Expected Discharge Plan:  Home/Self Care  In-House Referral:     Discharge planning Services  CM Consult  Post Acute Care Choice:    Choice offered to:     DME Arranged:    DME Agency:     HH Arranged:    HH Agency:     Status of Service:  In process, will continue to follow  Medicare Important Message Given:    Date Medicare IM Given:    Medicare IM give by:    Date Additional Medicare IM Given:    Additional Medicare Important Message give by:     If discussed at [place] of Stay Meetings, dates discussed:    Additional Comments: Conception Oms ([phone] Donneta Romberg (Other)  [phone]  Whitman Hero [place], [place] [phone] 04/18/2015, 10:22 AM

## 2015-04-19 NOTE — Progress Notes (Addendum)
[facility] [place]  Subjective  - Pain is his biggest issue.   Objective 109/72 72 98.9 F (37.2 C) (Oral) 18 97%  Intake/Output Summary (Last 24 hours) at 04/19/15 0801 Last data filed at 04/18/15 1400  Gross per 24 hour  Intake    480 ml  Output      0 ml  Net    480 ml   Right groin and leg incisions soft and intact Palpable PT right LE Heart RRR Lungs non labored breathing   Assessment/Planning: POD # 2 1. Right iliofemoral endarterectomy with bovine patch angioplasty 2. Extended right profundaplasty 3. Right common femoral artery to below-the-knee popliteal artery bypass with non-reversed ipsilateral greater saphenous vein     Encourage mobility Rolling walker for home Pain controll  Laurence Slate [facility] 04/19/2015 8:01 AM --  Laboratory Lab Results:  Recent Labs  04/17/15 2040 04/18/15 0416  WBC 6.0 3.9*  HGB 12.8* 11.0*  HCT 38.7* 33.8*  PLT 140* 135*   BMET  Recent Labs  04/16/15 1050 04/17/15 2040 04/18/15 0416  NA 139  --  137  K 4.8  --  4.0  CL 107  --  108  CO2 25  --  22  GLUCOSE 105*  --  98  BUN 16  --  8  CREATININE 1.29* 1.12 1.16  CALCIUM 10.1  --  9.0    COAG Lab Results  Component Value Date   INR 1.03 04/16/2015   INR 0.92 04/19/2013   No results found for: PTT  Agree with above. Incisions all look fine. Right foot is warm and well perfused. Anticipate discharge tomorrow.  Deitra Mayo, MD, [place] [phone] Office: [phone]

## 2015-04-19 NOTE — Anesthesia Postprocedure Evaluation (Signed)
Anesthesia Post Note  Patient: [name]  Procedure(s) Performed: Procedure(s) (LRB): ENDARTERECTOMY RIGHT ILIOFEMORAL WITH PROFUNDOPLASTY (Right) BYPASS GRAFT RIGHT FEMORALTO BELOW KNEE POPLITEAL ARTERY USING RIGHT NON REVERSED GREATER SAPPHENOUS VEIN (Right) XENOSURE PATCH ANGIOPLASTY, RIGHT ILEOFEMORAL (Right) RIGHT GREATER SAPPHENOUS VEIN HARVEST  (Right)  Patient location during evaluation: PACU Anesthesia Type: General Level of consciousness: awake Pain management: pain level controlled Vital Signs Assessment: post-procedure vital signs reviewed and stable Respiratory status: spontaneous breathing and patient connected to nasal cannula oxygen Cardiovascular status: stable Postop Assessment: no signs of nausea or vomiting Anesthetic complications: no    Last Vitals:  Filed Vitals:   04/18/15 2018 04/19/15 0430  BP: 122/68 109/72  Pulse: 71 72  Temp: 36.9 C 37.2 C  Resp: 18 18    Last Pain:  Filed Vitals:   04/19/15 1249  PainSc: Asleep                 Scott Chavez

## 2015-04-20 NOTE — Care Management Note (Signed)
Case Management Note  Patient Details  Name: FARRELL STITELER MRN: [id number] Date of Birth: [birth year]  Subjective/Objective:                  Right iliofemoral endarterectomy  Action/Plan: CM spoke with patient at the bedside. Patient needs a RW. Merry Proud at [facility] notified of the DME request and discharge date of today.   Expected Discharge Date:   04/20/15               Expected Discharge Plan:  Home/Self Care  In-House Referral:     Discharge planning Services  CM Consult  Post Acute Care Choice:    Choice offered to:     DME Arranged:  [place] [facility] DME Agency:  [place]:    [facility] Agency:     Status of Service:  Completed, signed off  Medicare Important Message Given:    Date Medicare IM Given:    Medicare IM give by:    Date Additional Medicare IM Given:    Additional Medicare Important Message give by:     If discussed at [place] of Stay Meetings, dates discussed:    Additional Comments:  Apolonio Schneiders, RN 04/20/2015, 12:02 PM

## 2015-04-20 NOTE — Care Management Important Message (Signed)
Important Message  Patient Details  Name: FARSHAD WOLINSKI MRN: [id number] Date of Birth: 1955/08/24   Medicare Important Message Given:  Yes    Apolonio Schneiders, RN 04/20/2015, 12:58 PM

## 2015-04-20 NOTE — Progress Notes (Addendum)
[facility] [place]  Subjective  - Doing better.  He is tolerating PO pain medication and is more mobile today.   Objective 129/73 71 98.3 F (36.8 C) (Oral) 18 98%  Intake/Output Summary (Last 24 hours) at 04/20/15 1050 Last data filed at 04/20/15 0400  Gross per 24 hour  Intake    480 ml  Output    300 ml  Net    180 ml   Right foot warm well perfused, active range of motion intact Incisions healing well, groin is soft with incisional firmness.  No frank hematoma Heart RRR Lungs non labored brathing    Assessment/Planning: POD # 31. Right iliofemoral endarterectomy with bovine patch angioplasty 2. Extended right profundaplasty 3. Right common femoral artery to below-the-knee popliteal artery bypass with non-reversed ipsilateral greater saphenous vein    Pain controlled with PO medication the last dose of IV morphine was last night. Mobility has improved Disposition stable plan on discharge home this evening  Laurence Slate [facility] 04/20/2015 10:50 AM --  Laboratory Lab Results:  Recent Labs  04/17/15 2040 04/18/15 0416  WBC 6.0 3.9*  HGB 12.8* 11.0*  HCT 38.7* 33.8*  PLT 140* 135*   BMET  Recent Labs  04/17/15 2040 04/18/15 0416  NA  --  137  K  --  4.0  CL  --  108  CO2  --  22  GLUCOSE  --  98  BUN  --  8  CREATININE 1.12 1.16  CALCIUM  --  9.0    COAG Lab Results  Component Value Date   INR 1.03 04/16/2015   INR 0.92 04/19/2013   No results found for: PTT   Doing well. For discharge today.  Deitra Mayo, MD, [place] [phone] Office: [phone]

## 2015-04-21 ENCOUNTER — Telehealth: Payer: Self-pay | Admitting: Vascular Surgery

## 2015-04-21 NOTE — Telephone Encounter (Signed)
-----   Message from Mena Goes, RN sent at 04/18/2015 11:48 AM EST ----- Regarding: Schedule   ----- Message -----    From: Alvia Grove, PA-C    Sent: 04/18/2015   7:38 AM      To: Vvs Charge Pool  S/p 1. Right iliofemoral endarterectomy with bovine patch angioplasty 2. Extended right profundaplasty 3. Right common femoral artery to below-the-knee popliteal artery bypass with non-reversed ipsilateral greater saphenous vein  04/17/15  F/u with Dr. Bridgett Larsson in 2 weeks  Thanks Maudie Mercury

## 2015-04-21 NOTE — Discharge Summary (Signed)
[facility] Discharge Summary   Patient ID:  DYER Scott Chavez MRN: [id number] DOB/AGE: Nov 19, 1955 59 y.o.  Admit date: 04/17/2015 Discharge date: 04/20/2015 Date of Surgery: 04/17/2015 Surgeon: Surgeon(s): Conrad [place], MD  Admission Diagnosis: Peripheral vascular disease with right lower extremity claudication I70.211  Discharge Diagnoses:  Peripheral vascular disease with right lower extremity claudication I70.211  Secondary Diagnoses: Past Medical History  Diagnosis Date  . History of renal failure   . Hemodialysis patient [facility])     "on dialysis 1998-2010" 04/25/2013)  . Hypertension   . Hypercholesteremia   . Bladder dysfunction   . Peripheral vascular disease ([place])   . Aorto-iliac disease ([place])   . ESRD (end stage renal disease) [facility])     transplant- 12/13/2010[facility] , followed by Dr. Moshe Cipro   . GERD (gastroesophageal reflux disease)   . Anemia     pt. not sure, but reports he is taking Fe for one month now.  . Self-catheterizes urinary bladder     pt. choice, but he reports that he desires to do this to avoid retention   . Internal hemorrhoids     Colonoscopy 2010 and anoscopy 2015  . Pneumonia     Procedure(s): ENDARTERECTOMY RIGHT ILIOFEMORAL WITH PROFUNDOPLASTY BYPASS GRAFT RIGHT FEMORALTO BELOW KNEE POPLITEAL ARTERY USING RIGHT NON REVERSED GREATER SAPPHENOUS VEIN XENOSURE PATCH ANGIOPLASTY, RIGHT ILEOFEMORAL RIGHT GREATER Lester VEIN HARVEST   Discharged Condition: good  HPI: Scott Chavez is a 59 y.o. (09-Apr-1956) male who presents for routine surveillance. Prior procedures include: 1. OA+PTA R fem-pop artery (12/13/13) 2. PTA+S R CIA & L CIA (10/25/13) 3. L iliofemoral EA w/ BPA, L CFA to BK pop bypass with NR ips GSV   L leg sx are stable: numbness in anterior shin. Pt previously had severe short distance claudication. He would like to proceed with bypass rather than endovascular intervention.  ABI (Date:  03/21/15)  R:   ABI: 0.73 (0.84),   DP: mono,   PT: mono,   L:   ABI: 1.01 (1.15),   DP: tri,   PT: tri,  RLE arterial duplex (03/21/15)  Monophasic flow throughout  Significant CFA stenosis proximal femoral bifurcation  SFA and PFA stenosis  Aortoiliac duplex (03/21/15)  Patent flow throughout  Patent stents   Hospital Course:  Scott Chavez is a 59 y.o. male is S/P  Procedure(s): ENDARTERECTOMY RIGHT ILIOFEMORAL WITH PROFUNDOPLASTY BYPASS GRAFT RIGHT FEMORALTO BELOW KNEE POPLITEAL ARTERY USING RIGHT NON REVERSED GREATER SAPPHENOUS VEIN XENOSURE PATCH ANGIOPLASTY, RIGHT ILEOFEMORAL RIGHT GREATER SAPPHENOUS VEIN HARVEST   POD#1 Right groin and leg incisions soft and intact Palpable PT right LE Heart RRR Lungs non labored breathing Pain control issues requiring IV push morphine every 2 hours  POD#2 Doing better. He is tolerating PO pain medication and is more mobile today. Tolerating PO's and voiding  Disposition stable for discharge home on PO pain medication.   Significant Diagnostic Studies: CBC Lab Results  Component Value Date   WBC 3.9* 04/18/2015   HGB 11.0* 04/18/2015   HCT 33.8* 04/18/2015   MCV 93.4 04/18/2015   PLT 135* 04/18/2015    BMET    Component Value Date/Time   NA 137 04/18/2015 0416   K 4.0 04/18/2015 0416   CL 108 04/18/2015 0416   CO2 22 04/18/2015 0416   GLUCOSE 98 04/18/2015 0416   BUN 8 04/18/2015 0416   CREATININE 1.16 04/18/2015 0416   CALCIUM 9.0 04/18/2015 0416   GFRNONAA >60 04/18/2015 0416  GFRAA >60 04/18/2015 0416   COAG Lab Results  Component Value Date   INR 1.03 04/16/2015   INR 0.92 04/19/2013     Disposition:  Discharge to :Home Discharge Instructions    Call MD for:  redness, tenderness, or signs of infection (pain, swelling, bleeding, redness, odor or green/yellow discharge around incision site)    Complete by:  As directed      Call MD for:  severe or increased pain, loss or  decreased feeling  in affected limb(s)    Complete by:  As directed      Call MD for:  temperature >100.5    Complete by:  As directed      Discharge patient    Complete by:  As directed   Discharge pt to home     Discharge wound care:    Complete by:  As directed   Wash the groin wound with soap and water daily and pat dry. (No tub bath-only shower)  Then put a dry gauze or washcloth there to keep this area dry daily and as needed. Wash remaining wounds with soap and water and pat dry.  Do not use Vaseline or neosporin on your incisions.  Only use soap and water on your incisions and then protect and keep dry.     Driving Restrictions    Complete by:  As directed   No driving for 2 weeks     Increase activity slowly    Complete by:  As directed   Walk with assistance use walker or cane as needed     Lifting restrictions    Complete by:  As directed   No lifting for 2 weeks     Resume previous diet    Complete by:  As directed             Medication List    TAKE these medications        aspirin 325 MG tablet  Take 325 mg by mouth 2 (two) times daily.     finasteride 5 MG tablet  Commonly known as:  PROSCAR  Take 5 mg by mouth daily before breakfast.     gabapentin 300 MG capsule  Commonly known as:  NEURONTIN  Take 1 capsule (300 mg total) by mouth 3 (three) times daily.     magnesium oxide 400 MG tablet  Commonly known as:  MAG-OX  Take 400 mg by mouth 2 (two) times daily.     mycophenolate 250 MG capsule  Commonly known as:  CELLCEPT  Take 500 mg by mouth 2 (two) times daily.     NIFEdipine 30 MG 24 hr tablet  Commonly known as:  PROCARDIA-XL/ADALAT-CC/NIFEDICAL-XL  Take 30 mg by mouth daily.     omeprazole 20 MG capsule  Commonly known as:  PRILOSEC  Take 20 mg by mouth daily.     oxyCODONE 5 MG immediate release tablet  Commonly known as:  Oxy IR/ROXICODONE  Take 1-2 tablets (5-10 mg total) by mouth every 4 (four) hours as needed for moderate pain.      rosuvastatin 5 MG tablet  Commonly known as:  CRESTOR  Take 5 mg by mouth daily before breakfast.     tacrolimus 1 MG capsule  Commonly known as:  PROGRAF  Take 3 mg by mouth 2 (two) times daily.     tadalafil 20 MG tablet  Commonly known as:  CIALIS  Take 20 mg by mouth daily as needed for erectile dysfunction.  tamsulosin 0.4 MG Caps capsule  Commonly known as:  FLOMAX  Take 0.4 mg by mouth daily.     zolpidem 10 MG tablet  Commonly known as:  AMBIEN  Take 10 mg by mouth at bedtime as needed for sleep.       Verbal and written Discharge instructions given to the patient. Wound care per Discharge AVS     Follow-up Information    Follow up with [name], MD In 2 weeks.   Specialties:  Vascular Surgery, Cardiology   Why:  Our office will call you to arrange an appointment (sent)   Contact information:   [place] [address] [phone]       Signed: Laurence Slate [facility] 04/21/2015, 12:34 PM  Addendum I have independently interviewed and examined the patient, and I agree with the physician assistant's discharge summary.  [name], MD [facility] [place] Office: [phone] Pager: [phone]  04/28/2015, 2:22 PM   - For VQI Registry use --- Instructions: Press F2 to tab through selections.  Delete question if not applicable.   Post-op:  Wound infection: No  Graft infection: No  Transfusion: No  If yes, 0 units given New Arrhythmia: No Ipsilateral amputation: [ ]  no, [ ]  Minor, [ ]  BKA, [ ]  AKA Discharge patency: [x ] Primary, [ ]  Primary assisted, [ ]  Secondary, [ ]  Occluded Patency judged by: [x ] Dopper only, [ ]  Palpable graft pulse, [ ]  Palpable distal pulse, [ ]  ABI inc. > 0.15, [ ]  Duplex  D/C Ambulatory Status: Ambulatory  Complications: MI: [x ] No, [ ]  Troponin only, [ ]  EKG or Clinical CHF: No Resp failure: [ x] none, [ ]  Pneumonia, [ ]  Ventilator Chg in renal function: [ x] none, [ ]  Inc.  Cr > 0.5, [ ]  Temp. Dialysis, [ ]  Permanent dialysis Stroke: [x ] None, [ ]  Minor, [ ]  Major Return to OR: No  Reason for return to OR: [ ]  Bleeding, [ ]  Infection, [ ]  Thrombosis, [ ]  Revision  Discharge medications: Statin use:  Yes ASA use:  Yes Plavix use:  No  for medical reason   Beta blocker use: No  for medical reason   Coumadin use: No  for medical reason

## 2015-04-23 ENCOUNTER — Encounter: Payer: Self-pay | Admitting: Family

## 2015-04-23 ENCOUNTER — Ambulatory Visit (INDEPENDENT_AMBULATORY_CARE_PROVIDER_SITE_OTHER): Payer: Medicare Other | Admitting: Family

## 2015-04-23 VITALS — BP 134/79 | HR 93 | Temp 98.6°F | Ht 70.0 in | Wt 141.0 lb

## 2015-04-23 DIAGNOSIS — I70211 Atherosclerosis of native arteries of extremities with intermittent claudication, right leg: Secondary | ICD-10-CM

## 2015-04-23 DIAGNOSIS — Z9489 Other transplanted organ and tissue status: Secondary | ICD-10-CM

## 2015-04-23 DIAGNOSIS — G8918 Other acute postprocedural pain: Secondary | ICD-10-CM

## 2015-04-23 DIAGNOSIS — Z95828 Presence of other vascular implants and grafts: Secondary | ICD-10-CM

## 2015-04-23 NOTE — Progress Notes (Signed)
Postoperative Visit   History of Present Illness  Scott Chavez is a 59 y.o. year old male patient of Dr. Bridgett Chavez who is s/p OA+PTA R fem-pop artery (12/13/13),  PTA+S R CIA & L CIA (10/25/13),  L iliofemoral EA w/ BPA, L CFA to BK pop bypass with NR ips GS, , and right CFA to BK popliteal artery bypass with non reversed ipsilateral GSV on 04/17/15.  He returns today with c/o right groin incision swelling, tenderness, and feeling tight when he sits up or lies down, he walked in today for appointment.  He denies fever or chills, denies drainage. Unclear to what extent he has been elevating his right leg when not walking. He is particularly concerned whether this will have a negative effect on his right renal transplant. His fiance' is with him.   Pt Diabetic: No Pt smoker: former smoker, is using nicotine vapor product.  Pt meds include: Statin :Yes ASA: Yes Other anticoagulants/antiplatelets: no    The patient's right leg and groin wounds are healed.  The patient notes resolution of lower extremity symptoms.  The patient is able to complete their activities of daily living.    For VQI Use Only  PRE-ADM LIVING: Home  AMB STATUS: Ambulatory with walker  Physical Examination  Filed Vitals:   04/23/15 1208  BP: 134/79  Pulse: 93  Temp: 98.6 F (37 C)  TempSrc: Oral  Height: 5\' 10"  (1.778 m)  Weight: 141 lb (63.957 kg)  SpO2: 100%   Body mass index is 20.23 kg/(m^2).   General: A&O x 3, WDWN  Pulmonary: Respirations are non labored  Cardiac: RRR  Vascular: Vessel Right Left  Radial Palpable Faintly Palpable  Ulnar Not Palpable Not Palpable  Brachial Palpable Palpable  Carotid  without bruit  without bruit  Aorta Not palpable N/A  Femoral Not Palpable,  +Doppler signal Palpable  Popliteal Not palpable Not palpable  PT Not Palpable,  + Doppler signal Faintly Palpable,  + Doppler signal  DP Not Palpable,    + Doppler signal Not Palpable, + Doppler signal    Gastrointestinal: soft, NTND, -G/R, - HSM, - palpable masses, - CVAT B  Musculoskeletal: M/S 5/5 throughout except 3/5 in right LE, Extremities without ischemic changes, 2+ non pitting edema in right leg  Neurologic: Pain and light touch intact in extremities, Motor exam as listed above         Right leg and groin: Incisions are healed, pedal pulses are Dopplerable, right leg with 2+ non pitting edema. Right groin with a seroma, no erythema, no drainage.  Medical Decision Making  Scott Chavez is a 59 y.o. year old male who presents s/p OA+PTA R fem-pop artery (12/13/13),  PTA+S R CIA & L CIA (10/25/13),  L iliofemoral EA w/ BPA, L CFA to BK pop bypass with NR ips GS, , and right CFA to BK popliteal artery bypass with non reversed ipsilateral GSV on 04/17/15. Dr. Scot Dock spoke with pt and his fiance', and examined pt. The swelling in pt's right groin appears to be a seroma, is not pulsitile, no signs of infection. Pt states he has 13 tabs of 5 mg oxycodone left and is asking for more to last him until he returns on 05/09/15. His script was written on 04/18/15 for 30 tabs, take 1-2 tabs every 4 hours prn pain, this is too soon for another script. Pt's friend will pick up a script in about a week if he needs it.  The patient's bypass incisions are healing appropriately with resolution of pre-operative symptoms.  Pt and fiance' were instructed in adequate elevation of his right leg to minimize post operative swelling.  Follow up as scheduled with Dr. Bridgett Chavez on 05/09/15.    Scott, Sharmon Leyden, RN, MSN, FNP-C [facility] [place] Office: [phone]  04/23/2015, 12:37 PM  Clinic MD: Scot Dock

## 2015-04-29 ENCOUNTER — Telehealth: Payer: Self-pay

## 2015-04-29 DIAGNOSIS — G8918 Other acute postprocedural pain: Secondary | ICD-10-CM

## 2015-04-29 MED ORDER — OXYCODONE HCL 5 MG PO TABS: 5.0000 mg | ORAL_TABLET | ORAL | Status: DC | PRN

## 2015-04-29 NOTE — Telephone Encounter (Signed)
Phone call from pt.  Requested a refill on pain medication.  Reported he is not able to get sleep, as he can't lay on his stomach, in usual sleeping position.  Reported the swelling of the right thigh is improving.  Also reported continued swelling of the right foot and lower leg.  Stated the "lump" in right groin remains same, as when checked in the office 12/21.  Reported fever of 99.3 yesterday, 12/26.  Hasn't checked temp. Today.  Stated the incisions are intact.  Denied any redness / drainage from the incisions.  C/o tenderness in the right ankle; described as a "reddish-brown" in color.  Stated he ran out of pain medication yesterday, and has been "miserable."  Stated he was told to call today for refill, after previous Rx was gone.  Discussed with S. Nickel, PA. Rec'd verb. Approval to refill Oxycodone 5 mg IR; qty # 30; no refills.  Pt. Advised Rx. Will be avail. to pick up. Agreed.

## 2015-05-01 ENCOUNTER — Encounter: Payer: Self-pay | Admitting: Vascular Surgery

## 2015-05-07 ENCOUNTER — Telehealth: Payer: Self-pay

## 2015-05-07 DIAGNOSIS — R2681 Unsteadiness on feet: Secondary | ICD-10-CM

## 2015-05-07 DIAGNOSIS — Z95828 Presence of other vascular implants and grafts: Secondary | ICD-10-CM

## 2015-05-07 DIAGNOSIS — I739 Peripheral vascular disease, unspecified: Secondary | ICD-10-CM

## 2015-05-07 NOTE — Telephone Encounter (Signed)
Phone call from pt.  Requested an Rx for a cane to assist him with walking.  Stated he would feel better with the support of a walking cane.  Discussed with S. Nickel, NP.  Gave verbal authorization for a walking cane.   Notified pt. Of authorization for a cane.  Stated he would like to pick up the prescription at the office.

## 2015-05-09 ENCOUNTER — Encounter: Payer: Self-pay | Admitting: Vascular Surgery

## 2015-05-09 ENCOUNTER — Ambulatory Visit (INDEPENDENT_AMBULATORY_CARE_PROVIDER_SITE_OTHER): Payer: Medicare Other | Admitting: Vascular Surgery

## 2015-05-09 VITALS — BP 136/86 | HR 93 | Temp 98.5°F | Resp 14 | Ht 70.0 in | Wt 143.0 lb

## 2015-05-09 DIAGNOSIS — M792 Neuralgia and neuritis, unspecified: Secondary | ICD-10-CM

## 2015-05-09 DIAGNOSIS — I70213 Atherosclerosis of native arteries of extremities with intermittent claudication, bilateral legs: Secondary | ICD-10-CM

## 2015-05-09 MED ORDER — GABAPENTIN 300 MG PO CAPS: 300.0000 mg | ORAL_CAPSULE | Freq: Three times a day (TID) | ORAL | Status: DC

## 2015-05-09 MED ORDER — ACETAMINOPHEN-CODEINE #3 300-30 MG PO TABS: 1.0000 | ORAL_TABLET | Freq: Four times a day (QID) | ORAL | Status: DC | PRN

## 2015-05-09 NOTE — Progress Notes (Signed)
    Postoperative Visit   History of Present Illness  Scott Chavez is a 60 y.o. year old male who presents for postoperative follow-up for: R iliofem EA w/ BPA, extended profundaplasty, R CFA to BK pop bypass with NR ips GSV (Date: 04/17/15).  The patient's wounds are healed.  The patient notes great improvement in R leg intermittent claudication.  The patient is able to complete their activities of daily living.  The patient's current symptoms are: bulge in R groin.  For VQI Use Only  PRE-ADM LIVING: Home  AMB STATUS: Ambulatory  Physical Examination  Filed Vitals:   05/09/15 0846  BP: 136/86  Pulse: 93  Temp: 98.5 F (36.9 C)  Resp: 14   RLE: Incisions are healed but calf incision looks a little attenuated, bulge in R groin with pulse, palpable PT pulse  Medical Decision Making  JIBRAN LEHL is a 60 y.o. year old male who presents s/p R iliofem EA w/ BPA, extended profundaplasty, R CFA to BK pop bypass with NR ips GSV for short-distance intermittent claudication which is greatly improved   Pt has either a small hematoma or seroma in the right groin.  Would manage conservatively for now.  The patient's bypass incisions are healing appropriately with resolution of pre-operative symptoms. I discussed in depth with the patient the nature of atherosclerosis, and emphasized the importance of maximal medical management including strict control of blood pressure, blood glucose, and lipid levels, obtaining regular exercise, and cessation of smoking.  The patient is aware that without maximal medical management the underlying atherosclerotic disease process will progress, limiting the benefit of any interventions. The patient's surveillance will included ABI and bypass duplex studies which will be completed in: 3 months, at which time the patient will be re-evaluated.   I emphasized the importance of routine surveillance of the patient's bypass, as the vascular surgery literature  emphasize the improved patency possible with assisted primary patency procedures versus secondary patency procedures. The patient agrees to participate in their maximal medical care and routine surveillance. I gave the patient a rx for Tylenol #3 1-2 pop q4-6 hr prn pain #30 no refill as he felt the percocet was too strong. I also renewed his Neurotin 300 mg 1 po tid. I will have the patient follow up in one month for wound check.  Thank you for allowing [place] to participate in this patient's care.  [name], MD [facility] [place] Office: [phone] Pager: [phone]  05/09/2015, 9:45 AM

## 2015-05-15 ENCOUNTER — Telehealth: Payer: Self-pay

## 2015-05-15 NOTE — Telephone Encounter (Signed)
Pt. Called and requested a refill on Tylenol #3.  Reported his pain level is at 7/10; has been taking 2 tabs every 6 hrs.  Reported he thinks there is some improvement in the incisional pain, but stated he continues to have right LE pain w/ walking.  Advised he will need to be evaluated again, at office, before pain medication can be authorized.  Appt. given for 8:45 AM, 1/13 with nurse practitioner.  Verb. understanding.

## 2015-05-16 ENCOUNTER — Encounter: Payer: Self-pay | Admitting: Family

## 2015-05-16 ENCOUNTER — Ambulatory Visit (INDEPENDENT_AMBULATORY_CARE_PROVIDER_SITE_OTHER): Payer: Self-pay | Admitting: Family

## 2015-05-16 VITALS — BP 146/91 | HR 73 | Temp 97.7°F | Resp 16 | Ht 70.0 in | Wt 144.0 lb

## 2015-05-16 DIAGNOSIS — Z9489 Other transplanted organ and tissue status: Secondary | ICD-10-CM

## 2015-05-16 DIAGNOSIS — M792 Neuralgia and neuritis, unspecified: Secondary | ICD-10-CM

## 2015-05-16 DIAGNOSIS — Z95828 Presence of other vascular implants and grafts: Secondary | ICD-10-CM

## 2015-05-16 DIAGNOSIS — I779 Disorder of arteries and arterioles, unspecified: Secondary | ICD-10-CM

## 2015-05-16 MED ORDER — LIDOCAINE 5 % EX OINT: TOPICAL_OINTMENT | CUTANEOUS | Status: DC

## 2015-05-16 MED ORDER — LIDOCAINE 5 % EX PTCH: 1.0000 | MEDICATED_PATCH | CUTANEOUS | Status: DC

## 2015-05-16 NOTE — Progress Notes (Signed)
Filed Vitals:   05/16/15 0902 05/16/15 0908  BP: 151/94 146/91  Pulse: 78 73  Temp: 97.7 F (36.5 C)   TempSrc: Oral   Resp: 16   Height: 5\' 10"  (1.778 m)   Weight: 144 lb (65.318 kg)   SpO2: 100%

## 2015-05-16 NOTE — Progress Notes (Signed)
Postoperative Visit   History of Present Illness  Scott Chavez is a 60 y.o. year old male who is s/p R iliofem EA w/ BPA, extended profundaplasty, R CFA to BK pop bypass with NR ips GSV (Date: 04/17/15), left fem-pop BPG in 2014. The patient's wounds are healed. The patient notes great improvement in R leg intermittent claudication. The patient is able to complete their activities of daily living. The patient's current symptoms are: hypersensitivity to touch and continued pain in the right lower leg.                                       The right groin seroma or hematoma is resolving.      The patient saw Dr. Bridgett Larsson on 05/09/15. At that time the pt had either a small hematoma or seroma in the right groin.Dr. Bridgett Larsson advised conservative management for now.  The patient's bypass incisions were healing appropriately with resolution of pre-operative symptoms.  The patient's surveillance will include ABI and bypass duplex studies which will be completed in: 3 months, at which time the patient will be re-evaluated.   .  Dr. Bridgett Larsson prescribed Tylenol #3 1-2 po,  q4-6 hr prn pain #30 no refill as the pt felt the percocet was too strong.  Dr. Bridgett Larsson also renewed pt's Neurotin 300 mg 1 po tid   the patient was to follow up in one month for wound check   He had a right kidney transplant in 2010.  Pt Diabetic: No Pt smoker: former smoker  Pt meds include: Statin :Yes ASA: Yes Other anticoagulants/antiplatelets: no    For VQI Use Only  PRE-ADM LIVING: Home  AMB STATUS: Ambulatory   Physical Examination .  Filed Vitals:   05/16/15 0902 05/16/15 0908  BP: 151/94 146/91  Pulse: 78 73  Temp: 97.7 F (36.5 C)   TempSrc: Oral   Resp: 16   Height: 5\' 10"  (1.778 m)   Weight: 144 lb (65.318 kg)   SpO2: 100%    Body mass index is 20.66 kg/(m^2).   General: A&O x 3, WDWN Gait:  limp Eyes: Pupils equal, Pulmonary: CTAB, without wheezes , rales or rhonchi Cardiac:  regular Rythm , no detected murmur     Carotid Bruits Left Right   Negative Negative  Aorta: is palpable Radial pulses: are 2+ palpable and =   VASCULAR EXAM: Extremities without ischemic changes  without Gangrene; without open wounds. Incisions at inner aspect of left leg are healed, no signs of infection or cellulitis, no drainage. Right groin hematoma/seroma is resolving. Small amount of swelling in right lower leg, non-pitting.     LE Pulses LEFT RIGHT   FEMORAL  3+palpable  2+palpable    POPLITEAL not palpable  not palpable   POSTERIOR TIBIAL not palpable, not Dopplerable  not palpable, Strong Doppler signal    DORSALIS PEDIS  ANTERIOR TIBIAL not palpable, Dopplerable   not palpable, faintly Dopplerable    PERONEAL Not Palpable, not Dopplerable   not Palpable, not Dopplerable    Abdomen: soft, NT, no palpable masses. Skin: no rashes, no ulcers. Musculoskeletal: no muscle wasting or atrophy. Neurologic: A&O X 3; Appropriate Affect ; SENSATION: normal; MOTOR FUNCTION: moving all extremities equally. Speech is fluent/normal. CN 2-12 grossly intact.           Right lower Extremity: Incisions are healed, wounds are healed, pedal pulses are  Dopplerable.  Medical Decision Making  LOYD EURICH is a 60 y.o. year old male who presents s/p R iliofem EA w/ BPA, extended profundaplasty, R CFA to BK pop bypass with NR ips GSV for short-distance intermittent claudication which is greatly improved. The right groin seroma or hematoma is resolving.   I spoke with Dr. Bridgett Larsson re pt's HPI and physical exam of today. The patient seems to be having hypersensitivity to touch and reperfusion pain of the right lower leg.  Lidocaine 5% ointment,  Lidoderm patch was cost prohibitive for pt, on right lower leg every 24 hours as needed; see prescription for specifics re application. Return as already scheduled to see Dr. Bridgett Larsson on 06/06/15.   The patient's bypass incisions are healing appropriately with resolution of pre-operative symptoms.   Scott Chavez, Scott Leyden, RN, MSN, FNP-C [facility] [place] Office: [phone]  05/16/2015, 9:00 AM  Clinic MD: Bridgett Larsson

## 2015-05-27 ENCOUNTER — Encounter: Payer: Self-pay | Admitting: Vascular Surgery

## 2015-06-06 ENCOUNTER — Encounter: Payer: Self-pay | Admitting: Vascular Surgery

## 2015-06-06 ENCOUNTER — Ambulatory Visit (INDEPENDENT_AMBULATORY_CARE_PROVIDER_SITE_OTHER): Payer: Medicare Other | Admitting: Vascular Surgery

## 2015-06-06 VITALS — BP 147/96 | HR 70 | Temp 97.4°F | Resp 14 | Ht 70.0 in | Wt 147.0 lb

## 2015-06-06 DIAGNOSIS — I70213 Atherosclerosis of native arteries of extremities with intermittent claudication, bilateral legs: Secondary | ICD-10-CM

## 2015-06-06 MED ORDER — PREGABALIN 50 MG PO CAPS: 50.0000 mg | ORAL_CAPSULE | Freq: Three times a day (TID) | ORAL | Status: DC

## 2015-06-06 NOTE — Progress Notes (Signed)
Filed Vitals:   06/06/15 0929 06/06/15 0933  BP: 155/95 147/96  Pulse: 72 70  Temp: 97.4 F (36.3 C)   Resp: 14   Height: 5\' 10"  (1.778 m)   Weight: 147 lb (66.679 kg)   SpO2: 100%

## 2015-06-06 NOTE — Addendum Note (Signed)
Addended by: Reola Calkins on: 06/06/2015 04:09 PM   Modules accepted: Orders

## 2015-06-06 NOTE — Progress Notes (Addendum)
    Postoperative Visit   History of Present Illness  Scott Chavez is a 60 y.o. (1956-04-17) male  who presents for postoperative follow-up for: R iliofem EA w/ BPA, extended profundaplasty, R CFA to BK pop bypass with NR ips GSV (Date: 04/17/15). The patient's wounds are healed. The patient continued great improvement in R leg intermittent claudication. The patient is able to complete their activities of daily living. The patient's current symptoms are: neuropathic pain lower leg.  This has worsened with ambulation.  The prior bulge in R groin has resolved completely.   For VQI Use Only  PRE-ADM LIVING: Home  AMB STATUS: Ambulatory   Physical Examination  Filed Vitals:   06/06/15 0929 06/06/15 0933  BP: 155/95 147/96  Pulse: 72 70  Temp: 97.4 F (36.3 C)   Resp: 14   Height: 5\' 10"  (1.778 m)   Weight: 147 lb (66.679 kg)   SpO2: 100%    RLE: Incisions are healed, bulge in R groin resolved, R leg edema 1+, faintly palpable PT pulse with strong doppler signals PT and DP   Medical Decision Making  Scott Chavez is a 60 y.o. (03/27/1956) male who presents s/p R iliofem EA w/ BPA, extended profundaplasty, R CFA to BK pop bypass with NR ips GSV for short-distance intermittent claudication which is greatly improved   The patient's bypass incisions are healing appropriately with resolution of pre-operative symptoms.  Unclear if his RLE neuropathic sx are due to reperfusion injury as there dermatome pattern of pain don't correspond to where the surgery was completed.  Will switch him from Neurotin to Lyrica 50 mg 1 PO tid initially.  I discussed in depth with the patient the nature of atherosclerosis, and emphasized the importance of maximal medical management including strict control of blood pressure, blood glucose, and lipid levels, obtaining regular exercise, and cessation of smoking. The patient is aware that without maximal medical management the underlying  atherosclerotic disease process will progress, limiting the benefit of any interventions.  The patient's surveillance will included BLE ABI, aortoiliac duplex, and BLE bypass duplex studies which will be completed in: 3 months, at which time the patient will be re-evaluated.   I emphasized the importance of routine surveillance of the patient's bypass, as the vascular surgery literature emphasize the improved patency possible with assisted primary patency procedures versus secondary patency procedures.  The patient agrees to participate in their maximal medical care and routine surveillance.  I will have the patient follow up in one month for wound check.  Thank you for allowing [place] to participate in this patient's care.   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   On exam, he has a non-infected segment of a thrombosis LUA AVF with residual thrombosis in this segment.  - I offered him resection of LUA AVF segment.  He prefers GEN anesthesia likely LMA for that procedure. - Will schedule once his school schedule and accomodate such.   [name], MD [facility] Office: [phone] Pager: [phone]  06/06/2015, 10:33 AM

## 2015-07-04 ENCOUNTER — Ambulatory Visit: Payer: Medicare Other | Admitting: Vascular Surgery

## 2015-08-12 ENCOUNTER — Other Ambulatory Visit: Payer: Self-pay

## 2015-08-12 DIAGNOSIS — Z8619 Personal history of other infectious and parasitic diseases: Secondary | ICD-10-CM

## 2015-08-13 ENCOUNTER — Telehealth: Payer: Self-pay | Admitting: Internal Medicine

## 2015-08-13 NOTE — Telephone Encounter (Signed)
Scheduled appt for Required labs on 08/18/15 and appointment with Dr Linus Salmons on 09-03-15/mb

## 2015-08-18 ENCOUNTER — Other Ambulatory Visit: Payer: Medicare Other

## 2015-08-18 DIAGNOSIS — B182 Chronic viral hepatitis C: Secondary | ICD-10-CM

## 2015-08-19 LAB — HEPATITIS A ANTIBODY, TOTAL: Hep A Total Ab: REACTIVE — AB

## 2015-08-19 LAB — IRON: IRON: 81 ug/dL (ref 50–180)

## 2015-08-19 LAB — ANA: ANA: NEGATIVE

## 2015-08-19 LAB — HEPATITIS B SURFACE ANTIBODY,QUALITATIVE: Hep B S Ab: POSITIVE — AB

## 2015-08-19 LAB — HEPATITIS B SURFACE ANTIGEN: Hepatitis B Surface Ag: NEGATIVE

## 2015-08-19 LAB — HIV ANTIBODY (ROUTINE TESTING W REFLEX): HIV 1&2 Ab, 4th Generation: NONREACTIVE

## 2015-08-19 LAB — HEPATITIS B CORE ANTIBODY, TOTAL: HEP B C TOTAL AB: REACTIVE — AB

## 2015-08-24 LAB — HCV RNA,LIPA RFLX NS5A DRUG RESIST

## 2015-08-24 LAB — HCV RNA, QUANT REAL-TIME PCR W/REFLEX
HCV RNA, PCR, QN (LOG): 6.64 {Log_IU}/mL — AB
HCV RNA, PCR, QN: 4400000 [IU]/mL — AB

## 2015-08-25 ENCOUNTER — Encounter: Payer: Self-pay | Admitting: Internal Medicine

## 2015-08-27 LAB — HCV RNA NS5A DRUG RESISTANCE

## 2015-09-03 ENCOUNTER — Encounter: Payer: Self-pay | Admitting: Internal Medicine

## 2015-09-03 ENCOUNTER — Ambulatory Visit (INDEPENDENT_AMBULATORY_CARE_PROVIDER_SITE_OTHER): Payer: Medicare Other | Admitting: Internal Medicine

## 2015-09-03 VITALS — BP 106/69 | HR 75 | Temp 98.1°F | Wt 139.0 lb

## 2015-09-03 DIAGNOSIS — B182 Chronic viral hepatitis C: Secondary | ICD-10-CM

## 2015-09-03 MED ORDER — LEDIPASVIR-SOFOSBUVIR 90-400 MG PO TABS: 1.0000 | ORAL_TABLET | Freq: Every day | ORAL | Status: DC

## 2015-09-03 NOTE — Patient Instructions (Signed)
Date 09/03/2015  Dear Mr Cota, As discussed in the [facility], your hepatitis C therapy will include the following medications:          Harvoni 90mg /400mg  tablet:           Take 1 tablet by mouth once daily   Please note that ALL MEDICATIONS WILL START ON THE SAME DATE for a total of 12 weeks. ---------------------------------------------------------------- Your HCV Treatment Start Date: TBA   Your HCV genotype:  1a    Liver Fibrosis: TBD    ---------------------------------------------------------------- YOUR PHARMACY CONTACT:   [place] [facility] Phone: [phone] Hours: Monday to Friday 7:30 am to 6:00 pm   Please always contact your pharmacy at least 3-4 business days before you run out of medications to ensure your next month's medication is ready or 1 week prior to running out if you receive it by mail.  Remember, each prescription is for 28 days. ---------------------------------------------------------------- GENERAL NOTES REGARDING YOUR HEPATITIS C MEDICATION:  SOFOSBUVIR/LEDIPASVIR (HARVONI): - Harvoni tablet is taken daily with OR without food. - The tablets are orange. - The tablets should be stored at room temperature.  - Acid reducing agents such as H2 blockers (ie. Pepcid (famotidine), Zantac (ranitidine), Tagamet (cimetidine), Axid (nizatidine) and proton pump inhibitors (ie. Prilosec (omeprazole), Protonix (pantoprazole), Nexium (esomeprazole), or Aciphex (rabeprazole)) can decrease effectiveness of Harvoni. Do not take until you have discussed with a health care provider.    -Antacids that contain magnesium and/or aluminum hydroxide (ie. Milk of Magensia, Rolaids, Gaviscon, Maalox, Mylanta, an dArthritis Pain Formula)can reduce absorption of Harvoni, so take them at least 4 hours before or after Harvoni.  -Calcium carbonate (calcium supplements or antacids such as Tums, Caltrate,  Os-Cal)needs to be taken at least 4 hours hours before or after Harvoni.  -St. John's wort or any products that contain St. John's wort like some herbal supplements  Please inform the office prior to starting any of these medications.  - The common side effects associated with Harvoni include:      1. Fatigue      2. Headache      3. Nausea      4. Diarrhea      5. Insomnia  Please note that this only lists the most common side effects and is NOT a comprehensive list of the potential side effects of these medications. For more information, please review the drug information sheets that come with your medication package from the pharmacy.  ---------------------------------------------------------------- GENERAL HELPFUL HINTS ON HCV THERAPY: 1. Stay well-hydrated. 2. Notify the [facility] of any changes in your other over-the-counter/herbal or prescription medications. 3. If you miss a dose of your medication, take the missed dose as soon as you remember. Return to your regular time/dose schedule the next day.  4.  Do not stop taking your medications without first talking with your healthcare provider. 5.  You may take Tylenol (acetaminophen), as long as the dose is less than 2000 mg (OR no more than 4 tablets of the Tylenol Extra Strengths 500mg  tablet) in 24 hours. 6.  You will see our pharmacist-specialist within the first 2 weeks of starting your medication. 7.  You will need to obtain routine labs around week 4 and12 weeks after starting and then 3 to 6 months after finishing Harvoni.    Scharlene Gloss, [place] [facility],  [place]  [phone]

## 2015-09-03 NOTE — Progress Notes (Signed)
Patient ID: Scott Chavez, male   DOB: [birth year], 60 y.o.   MRN: [id number]    [facility] for Infectious Disease   CC: consideration for treatment for chronic hepatitis C  HPI:  +Scott Chavez is a 60 y.o. male who presents for initial evaluation and management of chronic hepatitis C.  Patient tested positive years ago during evaluation for his transplant. Hepatitis C-associated risk factors present are: IV drug abuse (details: over 30 years ago). Patient denies history of blood transfusion, sexual contact with person with liver disease, tattoos. Patient has had other studies performed. Results: hepatitis C RNA by PCR, result: positive. Patient has not had prior treatment for Hepatitis C. Patient does not have a past history of liver disease. Patient does not have a family history of liver disease. Patient does not  have associated signs or symptoms related to liver disease.  Labs reviewed and confirm chronic hepatitis C with a positive viral load.   Records reviewed from nephrology.  He has a kidney transplant, on immunosuppressive drugs.  Creat stable.        Patient does have documented immunity to Hepatitis A. Patient does have documented immunity to Hepatitis B.    Review of Systems:  Constitutional: negative for fatigue and malaise Gastrointestinal: negative for diarrhea Musculoskeletal: negative for myalgias and arthralgias All other systems reviewed and are negative      Past Medical History  Diagnosis Date  . History of renal failure   . Hemodialysis patient [facility])     "on dialysis 1998-2010" 04/25/2013)  . Hypertension   . Hypercholesteremia   . Bladder dysfunction   . Peripheral vascular disease ([place])   . Aorto-iliac disease ([place])   . ESRD (end stage renal disease) [facility])     transplant- [facility] , followed by Dr. Moshe Cipro   . GERD (gastroesophageal reflux disease)   . Anemia     pt. not sure, but reports he is taking Fe for one month now.  .  Self-catheterizes urinary bladder     pt. choice, but he reports that he desires to do this to avoid retention   . Internal hemorrhoids     Colonoscopy 2010 and anoscopy 2015  . Pneumonia     Prior to Admission medications   Medication Sig Start Date End Date Taking? Authorizing Provider  aspirin 325 MG tablet Take 325 mg by mouth 2 (two) times daily.    Yes Historical Provider, MD  finasteride (PROSCAR) 5 MG tablet Take 5 mg by mouth daily before breakfast.    Yes Historical Provider, MD  magnesium oxide (MAG-OX) 400 (241.3 Mg) MG tablet  05/18/15  Yes Historical Provider, MD  magnesium oxide (MAG-OX) 400 MG tablet Take 400 mg by mouth 2 (two) times daily.    Yes Historical Provider, MD  mycophenolate (CELLCEPT) 250 MG capsule Take 500 mg by mouth 2 (two) times daily.    Yes Historical Provider, MD  NIFEdipine (PROCARDIA-XL/ADALAT CC) 30 MG 24 hr tablet  04/28/15  Yes Historical Provider, MD  NIFEdipine (PROCARDIA-XL/ADALAT-CC/NIFEDICAL-XL) 30 MG 24 hr tablet Take 30 mg by mouth daily.    Yes Historical Provider, MD  omeprazole (PRILOSEC) 20 MG capsule Take 20 mg by mouth daily. Reported on 06/06/2015   Yes Historical Provider, MD  pregabalin (LYRICA) 50 MG capsule Take 1 capsule (50 mg total) by mouth 3 (three) times daily. 06/06/15  Yes Conrad , MD  rosuvastatin (CRESTOR) 5 MG tablet Take 5 mg by mouth daily before  breakfast.    Yes Historical Provider, MD  tacrolimus (PROGRAF) 1 MG capsule Take 3 mg by mouth 2 (two) times daily.   Yes Historical Provider, MD  tadalafil (CIALIS) 20 MG tablet Take 20 mg by mouth daily as needed for erectile dysfunction.   Yes Historical Provider, MD  tamsulosin (FLOMAX) 0.4 MG CAPS capsule Take 0.4 mg by mouth daily.   Yes Historical Provider, MD  zolpidem (AMBIEN) 10 MG tablet Take 10 mg by mouth at bedtime as needed for sleep.   Yes Historical Provider, MD  acetaminophen-codeine (TYLENOL #3) 300-30 MG tablet Take 1-2 tablets by mouth every 6 (six) hours as  needed for moderate pain. Patient not taking: Reported on 09/03/2015 05/09/15   Conrad Tucker, MD  gabapentin (NEURONTIN) 300 MG capsule Take 1 capsule (300 mg total) by mouth 3 (three) times daily. Patient not taking: Reported on 09/03/2015 05/09/15   Conrad Goodwin, MD  Ledipasvir-Sofosbuvir (HARVONI) 90-400 MG TABS Take 1 tablet by mouth daily. 09/03/15   Thayer Headings, MD  lidocaine (XYLOCAINE) 5 % ointment Use disposable gloves to apply thin layer to painful area of lower right leg once daily as needed for pain. Place non adherent bandage over application. Patient not taking: Reported on 06/06/2015 05/16/15   Sharmon Leyden Nickel, NP  oxyCODONE (OXY IR/ROXICODONE) 5 MG immediate release tablet Take 1-2 tablets (5-10 mg total) by mouth every 4 (four) hours as needed for moderate pain. Patient not taking: Reported on 05/09/2015 04/29/15   Sharmon Leyden Nickel, NP    Allergies  Allergen Reactions  . Tape Rash    Silk tape    Social History  Substance Use Topics  . Smoking status: Light Tobacco Smoker -- 0.50 packs/day for 34 years    Types: E-cigarettes    Last Attempt to Quit: 02/17/2003  . Smokeless tobacco: Never Used     Comment: vapor cigrarette  . Alcohol Use: No    Family History  Problem Relation Age of Onset  . Hypertension Father   . Other Father     amputation  . Hypertension Mother   No cirrhosis   Objective:  Constitutional: in no apparent distress and alert,  Filed Vitals:   09/03/15 1447  BP: 106/69  Pulse: 75  Temp: 98.1 F (36.7 C)   Eyes: anicteric Cardiovascular: Cor RRR and No murmurs Respiratory: CTA B; normal respiratory effort Gastrointestinal: Bowel sounds are normal, liver is not enlarged, spleen is not enlarged Musculoskeletal: no pedal edema noted Skin: negatives: no rash; no porphyria cutanea tarda Lymphatic: no cervical lymphadenopathy   Laboratory Genotype: No results found for: HCVGENOTYPE HCV viral load: No results found for: HCVQUANT Lab Results    Component Value Date   WBC 3.9* 04/18/2015   HGB 11.0* 04/18/2015   HCT 33.8* 04/18/2015   MCV 93.4 04/18/2015   PLT 135* 04/18/2015    Lab Results  Component Value Date   CREATININE 1.16 04/18/2015   BUN 8 04/18/2015   NA 137 04/18/2015   K 4.0 04/18/2015   CL 108 04/18/2015   CO2 22 04/18/2015    Lab Results  Component Value Date   ALT 31 04/16/2015   AST 36 04/16/2015   ALKPHOS 57 04/16/2015     Labs and history reviewed and show CHILD-PUGH A  5-6 points: Child class A 7-9 points: Child class B 10-15 points: Child class C  Lab Results  Component Value Date   INR 1.03 04/16/2015   BILITOT 0.6 04/16/2015  ALBUMIN 3.9 04/16/2015     Assessment: New Patient with Chronic Hepatitis C genotype 1a, untreated.  I discussed with the patient the lab findings that confirm chronic hepatitis C as well as the natural history and progression of disease including about 30% of people who develop cirrhosis of the liver if left untreated and once cirrhosis is established there is a 2-7% risk per year of liver cancer and liver failure.  I discussed the importance of treatment and benefits in reducing the risk, even if significant liver fibrosis exists.   Plan: 1) Patient counseled extensively on limiting acetaminophen to no more than 2 grams daily, avoidance of alcohol. 2) Transmission discussed with patient including sexual transmission, sharing razors and toothbrush.   3) Will need referral to gastroenterology if concern for cirrhosis 4) Will need referral for substance abuse counseling: No.; Further work up to include urine drug screen  No. 5) Will prescribe Zepatier for 12 weeks or 16 weeks with ribavirin if any NS5A resistance found 6) Hepatitis A vaccine No. 7) Hepatitis B vaccine No. 8) Pneumovax vaccine if concern for cirrhosis 9) Further work up to include liver staging with elastography 10) NS5A test  Yes.  , no resistance 10) will follow up after starting medication

## 2015-09-03 NOTE — Progress Notes (Signed)
Patient stated blood pressure was low for him. Stated that he sometimes feels lightheaded.Patient says that he takes his blood pressure medication correctly. Explained to patient that if lightheadedness continues to contact primary care provider to make aware. He stated that he has an appointment coming but that if he feels it again sooner he will call primary provider.

## 2015-09-04 ENCOUNTER — Other Ambulatory Visit: Payer: Self-pay | Admitting: Internal Medicine

## 2015-09-04 MED ORDER — ELBASVIR-GRAZOPREVIR 50-100 MG PO TABS: 1.0000 | ORAL_TABLET | Freq: Every day | ORAL | Status: DC

## 2015-09-12 ENCOUNTER — Ambulatory Visit: Payer: Medicare Other | Admitting: Vascular Surgery

## 2015-09-12 ENCOUNTER — Encounter (HOSPITAL_COMMUNITY): Payer: Medicare Other

## 2015-09-12 ENCOUNTER — Encounter: Payer: Self-pay | Admitting: Vascular Surgery

## 2015-09-17 ENCOUNTER — Ambulatory Visit (HOSPITAL_COMMUNITY)
Admission: RE | Admit: 2015-09-17 | Discharge: 2015-09-17 | Disposition: A | Discharge status: Home / Self Care | Payer: Medicare Other | Source: Ambulatory Visit | Attending: Internal Medicine | Admitting: Internal Medicine

## 2015-09-17 DIAGNOSIS — B182 Chronic viral hepatitis C: Secondary | ICD-10-CM | POA: Diagnosis present

## 2015-09-19 ENCOUNTER — Ambulatory Visit (INDEPENDENT_AMBULATORY_CARE_PROVIDER_SITE_OTHER)
Admission: RE | Admit: 2015-09-19 | Discharge: 2015-09-19 | Disposition: A | Discharge status: Home / Self Care | Payer: Medicare Other | Source: Ambulatory Visit | Attending: Vascular Surgery | Admitting: Vascular Surgery

## 2015-09-19 ENCOUNTER — Ambulatory Visit (INDEPENDENT_AMBULATORY_CARE_PROVIDER_SITE_OTHER): Payer: Medicare Other | Admitting: Vascular Surgery

## 2015-09-19 ENCOUNTER — Encounter: Payer: Self-pay | Admitting: Vascular Surgery

## 2015-09-19 ENCOUNTER — Ambulatory Visit (HOSPITAL_COMMUNITY)
Admission: RE | Admit: 2015-09-19 | Discharge: 2015-09-19 | Disposition: A | Discharge status: Home / Self Care | Payer: Medicare Other | Source: Ambulatory Visit | Attending: Vascular Surgery | Admitting: Vascular Surgery

## 2015-09-19 VITALS — BP 145/88 | HR 74 | Ht 70.0 in | Wt 139.6 lb

## 2015-09-19 DIAGNOSIS — I70213 Atherosclerosis of native arteries of extremities with intermittent claudication, bilateral legs: Secondary | ICD-10-CM

## 2015-09-19 DIAGNOSIS — Z940 Kidney transplant status: Secondary | ICD-10-CM | POA: Diagnosis not present

## 2015-09-19 DIAGNOSIS — I10 Essential (primary) hypertension: Secondary | ICD-10-CM | POA: Insufficient documentation

## 2015-09-19 DIAGNOSIS — Z95828 Presence of other vascular implants and grafts: Secondary | ICD-10-CM | POA: Diagnosis not present

## 2015-09-19 DIAGNOSIS — E785 Hyperlipidemia, unspecified: Secondary | ICD-10-CM | POA: Diagnosis not present

## 2015-09-19 NOTE — Progress Notes (Signed)
Established Previous Bypass   History of Present Illness  Scott Chavez is a 60 y.o. (06/25/1955) male who presents with chief complaint: resolving neuropathic pain in R foot.  Previous operation(s) include:  1.  L iliofem EA w/ BPA, L CFA to BK pop BPG with NR ips GSV (04/24/13) 2.  PTA+S R CIA and L CIA (10/25/13) 3.  OA+PTA R F-P (12/13/13)  (ERROR in op note in laterality) 4.  R iliofem EA w/ BPA, extended R profundaplasty, R CFA to BK pop BPG w/ NR ips GSV (04/17/15)  The patient's symptoms have improved.  L radicular sx essentially resolved with mildly altered sensation in L medial shin.  R lower leg has some radicular sx similar to L but these have also improved.  His intermittent claudication sx are resolved.  The patient's treatment regimen currently included: maximal medical management and walking.  The patient's PMH, PSH, SH, and FamHx are unchanged from 06/06/15.  Current Outpatient Prescriptions  Medication Sig Dispense Refill  . acetaminophen-codeine (TYLENOL #3) 300-30 MG tablet Take 1-2 tablets by mouth every 6 (six) hours as needed for moderate pain. 30 tablet 0  . aspirin 325 MG tablet Take 325 mg by mouth 2 (two) times daily.     . Elbasvir-Grazoprevir (ZEPATIER) 50-100 MG TABS Take 1 tablet by mouth daily. 28 tablet 2  . finasteride (PROSCAR) 5 MG tablet Take 5 mg by mouth daily before breakfast.     . magnesium oxide (MAG-OX) 400 (241.3 Mg) MG tablet     . magnesium oxide (MAG-OX) 400 MG tablet Take 400 mg by mouth 2 (two) times daily.     . mycophenolate (CELLCEPT) 250 MG capsule Take 500 mg by mouth 2 (two) times daily.     Marland Kitchen NIFEdipine (PROCARDIA-XL/ADALAT CC) 30 MG 24 hr tablet     . NIFEdipine (PROCARDIA-XL/ADALAT-CC/NIFEDICAL-XL) 30 MG 24 hr tablet Take 30 mg by mouth daily.     Marland Kitchen omeprazole (PRILOSEC) 20 MG capsule Take 20 mg by mouth daily. Reported on 06/06/2015    . pregabalin (LYRICA) 50 MG capsule Take 1 capsule (50 mg total) by mouth 3 (three) times  daily. 30 capsule 11  . rosuvastatin (CRESTOR) 5 MG tablet Take 5 mg by mouth daily before breakfast.     . tacrolimus (PROGRAF) 1 MG capsule Take 3 mg by mouth 2 (two) times daily.    . tadalafil (CIALIS) 20 MG tablet Take 20 mg by mouth daily as needed for erectile dysfunction.    . tamsulosin (FLOMAX) 0.4 MG CAPS capsule Take 0.4 mg by mouth daily.    Marland Kitchen zolpidem (AMBIEN) 10 MG tablet Take 10 mg by mouth at bedtime as needed for sleep.    Marland Kitchen gabapentin (NEURONTIN) 300 MG capsule Take 1 capsule (300 mg total) by mouth 3 (three) times daily. (Patient not taking: Reported on 09/03/2015) 90 capsule 3  . lidocaine (XYLOCAINE) 5 % ointment Use disposable gloves to apply thin layer to painful area of lower right leg once daily as needed for pain. Place non adherent bandage over application. (Patient not taking: Reported on 06/06/2015) 30 g 0  . oxyCODONE (OXY IR/ROXICODONE) 5 MG immediate release tablet Take 1-2 tablets (5-10 mg total) by mouth every 4 (four) hours as needed for moderate pain. (Patient not taking: Reported on 05/09/2015) 30 tablet 0   No current facility-administered medications for this visit.    Allergies  Allergen Reactions  . Tape Rash    Silk tape  On ROS today: no rest pain, radicular sx in L mostly resolve, RLE radicular sx improved   Physical Examination  Filed Vitals:   09/19/15 1102  BP: 145/88  Pulse: 74  Height: 5\' 10"  (1.778 m)  Weight: 139 lb 9.6 oz (63.322 kg)  SpO2: 99%   Body mass index is 20.03 kg/(m^2).  General: A&O x 3, WD, thin  Pulmonary: Sym exp, good air movt, CTAB, no rales, rhonchi, & wheezing  Cardiac: RRR, Nl S1, S2, no Murmurs, rubs or gallops  Vascular: Vessel Right Left  Radial Palpable Palpable  Brachial Palpable Palpable  Carotid Palpable, without bruit Palpable, without bruit  Aorta Not palpable N/A  Femoral Palpable Palpable  Popliteal Not palpable Not palpable  PT Palpable Palpable  DP Palpable Palpable   Gastrointestinal:  soft, NTND, no G/R, no HSM, no masses, no CVAT B  Musculoskeletal: M/S 5/5 throughout , Extremities without ischemic changes, alls incisions are healed  Neurologic: Pain and light touch intact in extremities , Motor exam as listed above   Non-Invasive Vascular Imaging ABI (Date: 09/19/2015)  R:   ABI: 0.96 (0.73),   DP: mono,   PT: tri,   TBI: 1.18  L:   ABI: 1.05 (1.0),   DP: tri,   PT: bi,   TBI: 0.98  Aortoiliac Duplex (Date: 09/19/2015)  Ao: 90 c/s (damp bi)  R iliac: 156-183 c/s (in-stent)  L iliac: 98-119 c/s (in-stent)  B Bypass Duplex (Date: 09/19/2015)  Widely patent bilateral fem-pop bypasses  314 c/s in distal R EIA (not seen on aortoiliac)   Medical Decision Making  MOX [name] is a 60 y.o. male who presents with: resolved BLE intermittent claudication, s/p B CIA PTA+S, B fem-pop bypasses with GSV, CKT  .  Based on the patient's vascular studies and examination, I have offered the patient: BLE ABI, bypass duplex, and aortoiliac duplex in 3 months.  If stable, will start q6 month intervals.  I discussed in depth with the patient the nature of atherosclerosis, and emphasized the importance of maximal medical management including strict control of blood pressure, blood glucose, and lipid levels, obtaining regular exercise, and cessation of smoking.    The patient is aware that without maximal medical management the underlying atherosclerotic disease process will progress, limiting the benefit of any interventions.  I discussed in depth with the patient the need for long term surveillance to improve the primary assisted patency of his bypass.  The patient agrees to cooperate with such. The patient is currently on a statin: not medically indicated.  The patient is currently on an anti-platelet: aspirin.  Thank you for allowing [place] to participate in this patient's care.   [name], MD [facility] Office:  [phone] Pager: [phone]  09/19/2015, 11:24 AM

## 2015-09-22 ENCOUNTER — Other Ambulatory Visit: Payer: Self-pay

## 2015-09-22 MED FILL — *ZEPATIER 50-100 MG TABLET: 50-100 | 28 days supply | Qty: 28 | Fill #0

## 2015-09-23 ENCOUNTER — Encounter: Payer: Self-pay | Admitting: Pharmacy Technician

## 2015-09-23 MED ORDER — DEXTROSE 5 % IV SOLN
1.5000 g | INTRAVENOUS | Status: AC
  Administered 2015-09-24: 1.5 g via INTRAVENOUS

## 2015-09-23 MED ORDER — SODIUM CHLORIDE 0.9 % IV SOLN
INTRAVENOUS | Status: DC
  Administered 2015-09-24 (×2): via INTRAVENOUS

## 2015-09-23 NOTE — Anesthesia Preprocedure Evaluation (Addendum)
Anesthesia Evaluation  Patient identified by MRN, date of birth, ID band Patient awake    Reviewed: Allergy & Precautions, NPO status , Patient's Chart, lab work & pertinent test results  Airway Mallampati: II  TM Distance: >3 FB Neck ROM: Full    Dental no notable dental hx.    Pulmonary neg shortness of breath, neg sleep apnea, pneumonia, neg recent URI, Current Smoker, neg PE   breath sounds clear to auscultation       Cardiovascular hypertension, (-) angina+ Peripheral Vascular Disease  (-) Past MI  Rhythm:Regular     Neuro/Psych negative neurological ROS  negative psych ROS   GI/Hepatic GERD  Controlled,(+) Hepatitis -  Endo/Other    Renal/GU Renal InsufficiencyRenal disease     Musculoskeletal   Abdominal   Peds  Hematology  (+) anemia ,   Anesthesia Other Findings   Reproductive/Obstetrics                            Anesthesia Physical  Anesthesia Plan  ASA: III  Anesthesia Plan: General   Post-op Pain Management:    Induction: Intravenous  Airway Management Planned: LMA  Additional Equipment: None  Intra-op Plan:   Post-operative Plan: Extubation in OR  Informed Consent: I have reviewed the patients History and Physical, chart, labs and discussed the procedure including the risks, benefits and alternatives for the proposed anesthesia with the patient or authorized representative who has indicated his/her understanding and acceptance.   Dental advisory given  Plan Discussed with: CRNA  Anesthesia Plan Comments:       Anesthesia Quick Evaluation

## 2015-09-23 NOTE — Progress Notes (Signed)
I was unable to reach patient by phone.  I left  A message on voice mail.  I instructed the patient to arrive at [place] entrance at 0630 , nothing to eat or drink after midnight.   I instructed the patient to take the following medications in the am with just enough water to get them down: Aspirin, Celexa, Proscar, Cellcept, Procardia, Omeprazole, Lyrica, Prograf, Tamsulosin I asked patient to not wear any lotions, powders, cologne, jewelry, piercing, make-up or nail polish.  I asked the patient to call [phone], in the am if there were any questions or problems.

## 2015-09-24 ENCOUNTER — Ambulatory Visit (HOSPITAL_COMMUNITY): Payer: Medicare Other | Admitting: Anesthesiology

## 2015-09-24 ENCOUNTER — Telehealth: Payer: Self-pay | Admitting: Vascular Surgery

## 2015-09-24 ENCOUNTER — Encounter (HOSPITAL_COMMUNITY): Payer: Self-pay | Admitting: *Deleted

## 2015-09-24 ENCOUNTER — Encounter (HOSPITAL_COMMUNITY)
Admission: RE | Disposition: A | Discharge status: Home / Self Care | Payer: Self-pay | Source: Ambulatory Visit | Attending: Vascular Surgery

## 2015-09-24 ENCOUNTER — Ambulatory Visit (HOSPITAL_COMMUNITY)
Admission: RE | Admit: 2015-09-24 | Discharge: 2015-09-24 | Disposition: A | Discharge status: Home / Self Care | Payer: Medicare Other | Source: Ambulatory Visit | Attending: Vascular Surgery | Admitting: Vascular Surgery

## 2015-09-24 DIAGNOSIS — T82868A Thrombosis of vascular prosthetic devices, implants and grafts, initial encounter: Secondary | ICD-10-CM | POA: Insufficient documentation

## 2015-09-24 DIAGNOSIS — K219 Gastro-esophageal reflux disease without esophagitis: Secondary | ICD-10-CM | POA: Insufficient documentation

## 2015-09-24 DIAGNOSIS — I120 Hypertensive chronic kidney disease with stage 5 chronic kidney disease or end stage renal disease: Secondary | ICD-10-CM | POA: Insufficient documentation

## 2015-09-24 DIAGNOSIS — Z940 Kidney transplant status: Secondary | ICD-10-CM | POA: Insufficient documentation

## 2015-09-24 DIAGNOSIS — N186 End stage renal disease: Secondary | ICD-10-CM | POA: Insufficient documentation

## 2015-09-24 DIAGNOSIS — Z992 Dependence on renal dialysis: Secondary | ICD-10-CM | POA: Insufficient documentation

## 2015-09-24 DIAGNOSIS — K759 Inflammatory liver disease, unspecified: Secondary | ICD-10-CM | POA: Insufficient documentation

## 2015-09-24 DIAGNOSIS — F17210 Nicotine dependence, cigarettes, uncomplicated: Secondary | ICD-10-CM | POA: Insufficient documentation

## 2015-09-24 DIAGNOSIS — I739 Peripheral vascular disease, unspecified: Secondary | ICD-10-CM | POA: Diagnosis not present

## 2015-09-24 DIAGNOSIS — Z95820 Peripheral vascular angioplasty status with implants and grafts: Secondary | ICD-10-CM | POA: Diagnosis not present

## 2015-09-24 DIAGNOSIS — Z79899 Other long term (current) drug therapy: Secondary | ICD-10-CM | POA: Insufficient documentation

## 2015-09-24 DIAGNOSIS — Y832 Surgical operation with anastomosis, bypass or graft as the cause of abnormal reaction of the patient, or of later complication, without mention of misadventure at the time of the procedure: Secondary | ICD-10-CM | POA: Diagnosis not present

## 2015-09-24 DIAGNOSIS — Z7982 Long term (current) use of aspirin: Secondary | ICD-10-CM | POA: Insufficient documentation

## 2015-09-24 DIAGNOSIS — E7800 Pure hypercholesterolemia, unspecified: Secondary | ICD-10-CM | POA: Insufficient documentation

## 2015-09-24 DIAGNOSIS — N185 Chronic kidney disease, stage 5: Secondary | ICD-10-CM

## 2015-09-24 HISTORY — PX: THROMBECTOMY W/ EMBOLECTOMY: SHX2507

## 2015-09-24 LAB — POCT I-STAT 4, (NA,K, GLUC, HGB,HCT)
GLUCOSE: 113 mg/dL — AB (ref 65–99)
HEMATOCRIT: 42 % (ref 39.0–52.0)
Hemoglobin: 14.3 g/dL (ref 13.0–17.0)
POTASSIUM: 3.8 mmol/L (ref 3.5–5.1)
Sodium: 140 mmol/L (ref 135–145)

## 2015-09-24 SURGERY — THROMBECTOMY ARTERIOVENOUS FISTULA
Anesthesia: General | Laterality: Left

## 2015-09-24 MED ORDER — ROCURONIUM BROMIDE 50 MG/5ML IV SOLN
INTRAVENOUS | Status: AC
  Filled 2015-09-24: qty 1

## 2015-09-24 MED ORDER — LIDOCAINE HCL (PF) 1 % IJ SOLN
INTRAMUSCULAR | Status: AC
  Filled 2015-09-24: qty 30

## 2015-09-24 MED ORDER — PROPOFOL 10 MG/ML IV BOLUS
INTRAVENOUS | Status: DC | PRN
  Administered 2015-09-24: 200 mg via INTRAVENOUS

## 2015-09-24 MED ORDER — HYDROMORPHONE HCL 1 MG/ML IJ SOLN
INTRAMUSCULAR | Status: AC
  Administered 2015-09-24: 0.5 mg via INTRAVENOUS
  Filled 2015-09-24: qty 1

## 2015-09-24 MED ORDER — MIDAZOLAM HCL 2 MG/2ML IJ SOLN
INTRAMUSCULAR | Status: AC
  Filled 2015-09-24: qty 2

## 2015-09-24 MED ORDER — LIDOCAINE 2% (20 MG/ML) 5 ML SYRINGE
INTRAMUSCULAR | Status: AC
  Filled 2015-09-24: qty 10

## 2015-09-24 MED ORDER — FENTANYL CITRATE (PF) 100 MCG/2ML IJ SOLN
INTRAMUSCULAR | Status: DC | PRN
  Administered 2015-09-24 (×2): 25 ug via INTRAVENOUS

## 2015-09-24 MED ORDER — MEPERIDINE HCL 25 MG/ML IJ SOLN: 6.2500 mg | INTRAMUSCULAR | Status: DC | PRN

## 2015-09-24 MED ORDER — LIDOCAINE HCL (CARDIAC) 20 MG/ML IV SOLN
INTRAVENOUS | Status: DC | PRN
  Administered 2015-09-24: 100 mg via INTRAVENOUS

## 2015-09-24 MED ORDER — FENTANYL CITRATE (PF) 250 MCG/5ML IJ SOLN
INTRAMUSCULAR | Status: AC
  Filled 2015-09-24: qty 5

## 2015-09-24 MED ORDER — HEPARIN SODIUM (PORCINE) 5000 UNIT/ML IJ SOLN
INTRAMUSCULAR | Status: DC | PRN
  Administered 2015-09-24: 500 mL

## 2015-09-24 MED ORDER — CHLORHEXIDINE GLUCONATE CLOTH 2 % EX PADS: 6.0000 | MEDICATED_PAD | Freq: Once | CUTANEOUS | Status: DC

## 2015-09-24 MED ORDER — SODIUM CHLORIDE 0.9 % IR SOLN
Status: DC | PRN
  Administered 2015-09-24: 1

## 2015-09-24 MED ORDER — PROPOFOL 10 MG/ML IV BOLUS
INTRAVENOUS | Status: AC
  Filled 2015-09-24: qty 20

## 2015-09-24 MED ORDER — DEXTROSE 5 % IV SOLN
INTRAVENOUS | Status: AC
  Filled 2015-09-24: qty 1.5

## 2015-09-24 MED ORDER — HYDROMORPHONE HCL 1 MG/ML IJ SOLN
0.2500 mg | INTRAMUSCULAR | Status: DC | PRN
  Administered 2015-09-24 (×2): 0.5 mg via INTRAVENOUS

## 2015-09-24 MED ORDER — EPHEDRINE SULFATE 50 MG/ML IJ SOLN
INTRAMUSCULAR | Status: DC | PRN
  Administered 2015-09-24: 10 mg via INTRAVENOUS
  Administered 2015-09-24: 5 mg via INTRAVENOUS
  Administered 2015-09-24: 15 mg via INTRAVENOUS

## 2015-09-24 MED ORDER — OXYCODONE-ACETAMINOPHEN 5-325 MG PO TABS
1.0000 | ORAL_TABLET | Freq: Once | ORAL | Status: AC
  Administered 2015-09-24: 2 via ORAL

## 2015-09-24 MED ORDER — OXYCODONE-ACETAMINOPHEN 5-325 MG PO TABS
ORAL_TABLET | ORAL | Status: AC
  Filled 2015-09-24: qty 2

## 2015-09-24 MED ORDER — OXYCODONE-ACETAMINOPHEN 5-325 MG PO TABS: 1.0000 | ORAL_TABLET | Freq: Four times a day (QID) | ORAL | Status: DC | PRN

## 2015-09-24 MED ORDER — MIDAZOLAM HCL 5 MG/5ML IJ SOLN
INTRAMUSCULAR | Status: DC | PRN
  Administered 2015-09-24: 2 mg via INTRAVENOUS

## 2015-09-24 MED ORDER — ONDANSETRON HCL 4 MG/2ML IJ SOLN
INTRAMUSCULAR | Status: DC | PRN
  Administered 2015-09-24: 4 mg via INTRAVENOUS

## 2015-09-24 SURGICAL SUPPLY — 36 items
AGENT HMST SPONGE THK3/8 (HEMOSTASIS)
ARMBAND PINK RESTRICT EXTREMIT (MISCELLANEOUS) ×3 IMPLANT
BAG DECANTER FOR FLEXI CONT (MISCELLANEOUS) ×2 IMPLANT
CANISTER SUCTION 2500CC (MISCELLANEOUS) ×3 IMPLANT
CATH EMB 4FR 80CM (CATHETERS) ×1 IMPLANT
CLIP TI MEDIUM 6 (CLIP) ×3 IMPLANT
CLIP TI WIDE RED SMALL 6 (CLIP) ×3 IMPLANT
COVER PROBE W GEL 5X96 (DRAPES) ×3 IMPLANT
DRAPE X-RAY CASS 24X20 (DRAPES) IMPLANT
ELECT REM PT RETURN 9FT ADLT (ELECTROSURGICAL) ×3
ELECTRODE REM PT RTRN 9FT ADLT (ELECTROSURGICAL) ×1 IMPLANT
GAUZE SPONGE 4X4 16PLY XRAY LF (GAUZE/BANDAGES/DRESSINGS) IMPLANT
GEL ULTRASOUND 20GR AQUASONIC (MISCELLANEOUS) IMPLANT
GLOVE BIO SURGEON STRL SZ7 (GLOVE) ×3 IMPLANT
GLOVE BIOGEL PI IND STRL 7.5 (GLOVE) ×1 IMPLANT
GLOVE BIOGEL PI INDICATOR 7.5 (GLOVE) ×2
GOWN STRL REUS W/ TWL LRG LVL3 (GOWN DISPOSABLE) ×3 IMPLANT
GOWN STRL REUS W/TWL LRG LVL3 (GOWN DISPOSABLE) ×9
HEMOSTAT SPONGE AVITENE ULTRA (HEMOSTASIS) IMPLANT
INSERT FOGARTY SM (MISCELLANEOUS) ×1 IMPLANT
KIT BASIN OR (CUSTOM PROCEDURE TRAY) ×3 IMPLANT
KIT ROOM TURNOVER OR (KITS) ×3 IMPLANT
LIQUID BAND (GAUZE/BANDAGES/DRESSINGS) ×3 IMPLANT
NS IRRIG 1000ML POUR BTL (IV SOLUTION) ×3 IMPLANT
PACK CV ACCESS (CUSTOM PROCEDURE TRAY) ×3 IMPLANT
PAD ARMBOARD 7.5X6 YLW CONV (MISCELLANEOUS) ×6 IMPLANT
SET COLLECT BLD 21X3/4 12 (NEEDLE) IMPLANT
STOPCOCK 4 WAY LG BORE MALE ST (IV SETS) IMPLANT
SUT MNCRL AB 4-0 PS2 18 (SUTURE) ×3 IMPLANT
SUT PROLENE 5 0 C 1 24 (SUTURE) IMPLANT
SUT PROLENE 6 0 BV (SUTURE) ×3 IMPLANT
SUT VIC AB 3-0 SH 27 (SUTURE) ×6
SUT VIC AB 3-0 SH 27X BRD (SUTURE) ×1 IMPLANT
TUBING EXTENTION W/L.L. (IV SETS) IMPLANT
UNDERPAD 30X30 INCONTINENT (UNDERPADS AND DIAPERS) ×3 IMPLANT
WATER STERILE IRR 1000ML POUR (IV SOLUTION) ×3 IMPLANT

## 2015-09-24 NOTE — Addendum Note (Signed)
Addendum  created 09/24/15 1625 by Wilburn Cornelia, CRNA   Modules edited: Anesthesia Events, Narrator   Narrator:  Narrator: Event Log Edited

## 2015-09-24 NOTE — H&P (Signed)
Brief History and Physical  History of Present Illness  Scott Chavez is a 60 y.o. male who presents with chief complaint: sx thrombosed L RC AVF segment.  The patient presents today for excision of sx thrombosed L RC AVF segment.    Past Medical History  Diagnosis Date  . History of renal failure   . Hemodialysis patient [facility])     "on dialysis 1998-2010" 04/25/2013)  . Hypertension   . Hypercholesteremia   . Bladder dysfunction   . Peripheral vascular disease ([place])   . Aorto-iliac disease ([place])   . ESRD (end stage renal disease) [facility])     [facility]- [facility] , followed by Dr. Moshe Cipro   . GERD (gastroesophageal reflux disease)   . Anemia     pt. not sure, but reports he is taking Fe for one month now.  . Self-catheterizes urinary bladder     pt. choice, but he reports that he desires to do this to avoid retention   . Internal hemorrhoids     Colonoscopy 2010 and anoscopy 2015  . Pneumonia     Past Surgical History  Procedure Laterality Date  . Av fistula placement Left 1998    "removed 2011" (04/25/2013)  . Nephrectomy recipient  2010  . S/p cadaveric renal transplant  December 2010    [place] bypass graft Left 04/24/2013    Procedure: BYPASS GRAFT COMMON FEMORALARTERY-BELOW KNEE POPLITEAL ARTERY WITH GREATER SAPHENOUS VEIN;  Surgeon: Conrad Laurinburg, MD;  Location: [place];  Service: Vascular;  Laterality: Left;  . Intraoperative arteriogram Left 04/24/2013    Procedure: INTRA OPERATIVE ARTERIOGRAM;  Surgeon: Conrad Ward, MD;  Location: [place];  Service: Vascular;  Laterality: Left;  . Av fistula repair Left     removal with partial vein removed  . Colonoscopy    . Abdominal aortagram N/A 03/09/2013    Procedure: ABDOMINAL Maxcine Ham;  Surgeon: Elam Dutch, MD;  Location: [facility] CATH LAB;  Service: Cardiovascular;  Laterality: N/A;  . Lower extremity angiogram Bilateral 03/09/2013    Procedure: LOWER EXTREMITY ANGIOGRAM;   Surgeon: Elam Dutch, MD;  Location: [facility] CATH LAB;  Service: Cardiovascular;  Laterality: Bilateral;  . Lower extremity angiogram Right 10/25/2013    Procedure: LOWER EXTREMITY ANGIOGRAM;  Surgeon: Conrad Greenwood, MD;  Location: [facility] CATH LAB;  Service: Cardiovascular;  Laterality: Right;  . Lower extremity angiogram Right 12/13/2013    Procedure: LOWER EXTREMITY ANGIOGRAM;  Surgeon: Conrad Chical, MD;  Location: [facility] CATH LAB;  Service: Cardiovascular;  Laterality: Right;  . Peripheral vascular catheterization N/A 04/10/2015    Procedure: Abdominal Aortogram;  Surgeon: Conrad Wildwood, MD;  Location: [place] CV LAB;  Service: Cardiovascular;  Laterality: N/A;  . Endarterectomy femoral Right 04/17/2015    Procedure: ENDARTERECTOMY RIGHT ILIOFEMORAL WITH PROFUNDOPLASTY;  Surgeon: Conrad Nocona Hills, MD;  Location: [place];  Service: Vascular;  Laterality: Right;  . Femoral-popliteal bypass graft Right 04/17/2015    Procedure: BYPASS GRAFT RIGHT FEMORALTO BELOW KNEE POPLITEAL ARTERY USING RIGHT NON REVERSED GREATER [place];  Surgeon: Conrad North Hobbs, MD;  Location: [place];  Service: Vascular;  Laterality: Right;  . Patch angioplasty Right 04/17/2015    Procedure: Rueben Bash PATCH ANGIOPLASTY, RIGHT ILEOFEMORAL;  Surgeon: Conrad Annville, MD;  Location: [place];  Service: Vascular;  Laterality: Right;  . Vein harvest Right 04/17/2015    Procedure: RIGHT GREATER SAPPHENOUS VEIN HARVEST ;  Surgeon: Conrad Sturtevant,  MD;  Location: [facility] OR;  Service: Vascular;  Laterality: Right;    Social History   Social History  . Marital Status: Divorced    Spouse Name: N/A  . Number of Children: 3  . Years of Education: N/A   Occupational History  . student    Social History Main Topics  . Smoking status: Light Tobacco Smoker -- 0.50 packs/day for 34 years    Types: E-cigarettes    Last Attempt to Quit: 02/17/2003  . Smokeless tobacco: Never Used     Comment: vapor cigrarette  . Alcohol Use: No  . Drug Use: Yes     Special: Cocaine, Marijuana, Heroin     Comment: 04/25/2013 "abused in the past ; stopped  11/13/2002  . Sexual Activity: Not on file   Other Topics Concern  . Not on file   Social History Narrative   Patient reports he is divorced. 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   On disability otherwise, he is a vapor smoker, 3 caffeinated beverages daily    Family History  Problem Relation Age of Onset  . Hypertension Father   . Other Father     amputation  . Hypertension Mother     No current facility-administered medications on file prior to encounter.   Current Outpatient Prescriptions on File Prior to Encounter  Medication Sig Dispense Refill  . aspirin 325 MG tablet Take 325 mg by mouth 2 (two) times daily.     . Elbasvir-Grazoprevir (ZEPATIER) 50-100 MG TABS Take 1 tablet by mouth daily. 28 tablet 2  . finasteride (PROSCAR) 5 MG tablet Take 5 mg by mouth daily before breakfast.     . lidocaine (XYLOCAINE) 5 % ointment Use disposable gloves to apply thin layer to painful area of lower right leg once daily as needed for pain. Place non adherent bandage over application. (Patient taking differently: Apply 1 application topically daily as needed for mild pain. Use disposable gloves to apply thin layer to painful area of lower right leg once daily as needed for pain. Place non adherent bandage over application.) 30 g 0  . magnesium oxide (MAG-OX) 400 MG tablet Take 400 mg by mouth 2 (two) times daily.     . mycophenolate (CELLCEPT) 250 MG capsule Take 500 mg by mouth 2 (two) times daily. 500 mg in the morning and 250 mg in the evening    . NIFEdipine (PROCARDIA-XL/ADALAT-CC/NIFEDICAL-XL) 30 MG 24 hr tablet Take 30 mg by mouth daily.     Marland Kitchen omeprazole (PRILOSEC) 20 MG capsule Take 20 mg by mouth daily. Reported on 06/06/2015    . pregabalin (LYRICA) 50 MG capsule Take 1 capsule (50 mg total) by mouth 3 (three) times daily. 30 capsule 11  . rosuvastatin (CRESTOR) 5 MG tablet Take 5  mg by mouth daily before breakfast.     . tacrolimus (PROGRAF) 1 MG capsule Take 3 mg by mouth 2 (two) times daily.    . tadalafil (CIALIS) 20 MG tablet Take 20 mg by mouth daily as needed for erectile dysfunction.    . tamsulosin (FLOMAX) 0.4 MG CAPS capsule Take 0.4 mg by mouth daily.    Marland Kitchen zolpidem (AMBIEN) 10 MG tablet Take 10 mg by mouth at bedtime as needed for sleep.      Allergies  Allergen Reactions  . Tape Rash    Silk tape    Review of Systems: As listed above, otherwise negative.  Physical Examination  Filed Vitals:   09/24/15 0701  BP: 108/68  Pulse: 75  Temp: 97.8 F (36.6 C)  TempSrc: Oral  Resp: 18  Height: 5\' 10"  (1.778 m)  Weight: 139 lb 9 oz (63.305 kg)  SpO2: 100%    General: A&O x 3, WDWN  Pulmonary: Sym exp, good air movt, CTAB, no rales, rhonchi, & wheezing  Cardiac: RRR, Nl S1, S2, no Murmurs, rubs or gallops  Gastrointestinal: soft, NTND, -G/R, - HSM, - masses, - CVAT B  Musculoskeletal: M/S 5/5 throughout , Extremities without ischemic changes, large thrombosed segment of L RC AVF near antecubitum, healed prior excision sites  Laboratory See [place]  Scott Chavez is a 60 y.o. male who presents with: thrombosed L RC AVF.   The patient is scheduled for: excision of thrombosed L RC AVF  Risk, benefits, and alternatives to access surgery were discussed.  The patient is aware the risks include but are not limited to: bleeding, infection, steal syndrome, nerve damage, ischemic monomelic neuropathy, failure to mature, and need for additional procedures.  The patient is aware of the risks and agrees to proceed.  [name], MD [facility] [place] Office: [phone] Pager: [phone]  09/24/2015, 7:58 AM

## 2015-09-24 NOTE — Anesthesia Procedure Notes (Signed)
Procedure Name: LMA Insertion Date/Time: 09/24/2015 8:31 AM Performed by: Merrilyn Puma B Pre-anesthesia Checklist: Patient identified, Emergency Drugs available, Timeout performed, Suction available and Patient being monitored Patient Re-evaluated:Patient Re-evaluated prior to inductionOxygen Delivery Method: Circle system utilized Preoxygenation: Pre-oxygenation with 100% oxygen Intubation Type: IV induction Ventilation: Mask ventilation without difficulty LMA: LMA inserted LMA Size: 4.0 Number of attempts: 1 Placement Confirmation: positive ETCO2 and breath sounds checked- equal and bilateral Tube secured with: Tape Dental Injury: Teeth and Oropharynx as per pre-operative assessment

## 2015-09-24 NOTE — Op Note (Signed)
    OPERATIVE NOTE   PROCEDURE: Excision of thrombosed left radiocephalic arteriovenous fistula segment  PRE-OPERATIVE DIAGNOSIS: end stage renal disease   POST-OPERATIVE DIAGNOSIS: same as above   SURGEON: [name], MD  ASSISTANT(S): Leontine Locket, PAC   ANESTHESIA: general  ESTIMATED BLOOD LOSS: 50 cc  FINDING(S): 1.  Thrombosed large left radiocephalic arteriovenous fistula segment  SPECIMEN(S):  none  INDICATIONS:   Scott Chavez is a 60 y.o. male who presents with symptomatic left radiocephalic arteriovenous fistula   segment.  Patient requested excision of residual antecubital segment of left radiocephalic arteriovenous fistula due to the thrombosed segment rubbing against his clothes causing discomfort.  The patient is aware the risks include bleeding, infection, and possible need for additional procedures..  DESCRIPTION: After obtaining full informed written consent, the patient was brought back to the operating room and placed supine upon the operating table.  The patient received IV antibiotics prior to induction.  After obtaining adequate anesthesia, the patient was prepped and draped in the standard fashion for: left arm access procedure.  I made an elliptical incision around the antecubital segment of the left radiocephalic arteriovenous fistula.  I dissected down to the thrombosed fistula.  I dissected distal and ligated the branch going to the upper arm and down into the forearm.  I then dissected out the cubital segment draining into either the brachial or basilic vein.  This turned out to be the bulk of this thrombosed segment.  I tied off the proximal segment of the vein and then transected the thrombosed segment of this fistula and passed it off the field.  I washed out the surgical site.  Bleeding points were controlled with electrocautery.  The subcutaneous tissue was reapproximated with 3-0 Vicryl.  The skin was then reapproximated with a running subcuticular  stitch of 4-0 Vicryl.  The skin was cleaned, dried, and reinforced with Dermabond. .  COMPLICATIONS: none  CONDITION: stable  [name], MD [facility] [place] Office: [phone] Pager: [phone]  09/24/2015, 9:15 AM

## 2015-09-24 NOTE — Anesthesia Postprocedure Evaluation (Signed)
Anesthesia Post Note  Patient: Scott Chavez  Procedure(s) Performed: Procedure(s) (LRB): EXCISION OF THROMBOSED RADIOCEPHALIC ARTERIOVENOUS FISTULA (Left)  Patient location during evaluation: PACU Anesthesia Type: General Level of consciousness: sedated and patient cooperative Pain management: pain level controlled Vital Signs Assessment: post-procedure vital signs reviewed and stable Respiratory status: spontaneous breathing Cardiovascular status: stable Anesthetic complications: no    Last Vitals:  Filed Vitals:   09/24/15 1000 09/24/15 1008  BP: 131/69 122/83  Pulse: 81 84  Temp:    Resp: 23 18    Last Pain:  Filed Vitals:   09/24/15 1010  PainSc: 2                  Nolon Nations

## 2015-09-24 NOTE — Telephone Encounter (Signed)
Sched appt 6/15 at 1:30. Spoke with pt to inform them of appt.

## 2015-09-24 NOTE — Telephone Encounter (Signed)
-----   Message from Mena Goes, RN sent at 09/24/2015 10:43 AM EDT ----- Regarding: schedule   ----- Message -----    From: Gabriel Earing, PA-C    Sent: 09/24/2015   9:28 AM      To: Vvs Charge Pool  S/p excision of left thrombosed brachiocephalic AVF [id number].  F/u with [facility] in 3 weeks.    Thanks, Aldona Bar

## 2015-09-24 NOTE — Transfer of Care (Signed)
Immediate Anesthesia Transfer of Care Note  Patient: Scott Chavez  Procedure(s) Performed: Procedure(s): EXCISION OF THROMBOSED RADIOCEPHALIC ARTERIOVENOUS FISTULA (Left)  Patient Location: PACU  Anesthesia Type:General  Level of Consciousness: awake, alert  and oriented  Airway & Oxygen Therapy: Patient Spontanous Breathing and Patient connected to nasal cannula oxygen  Post-op Assessment: Report given to RN, Post -op Vital signs reviewed and stable and Patient moving all extremities X 4  Post vital signs: Reviewed and stable  Last Vitals:  Filed Vitals:   09/24/15 0701  BP: 108/68  Pulse: 75  Temp: 36.6 C  Resp: 18    Last Pain: There were no vitals filed for this visit.       Complications: No apparent anesthesia complications

## 2015-09-25 ENCOUNTER — Encounter (HOSPITAL_COMMUNITY): Payer: Self-pay | Admitting: Vascular Surgery

## 2015-10-14 ENCOUNTER — Ambulatory Visit (INDEPENDENT_AMBULATORY_CARE_PROVIDER_SITE_OTHER): Payer: Medicare Other | Admitting: Pharmacist

## 2015-10-14 ENCOUNTER — Encounter: Payer: Self-pay | Admitting: Vascular Surgery

## 2015-10-14 ENCOUNTER — Ambulatory Visit: Payer: Medicare Other

## 2015-10-14 DIAGNOSIS — B182 Chronic viral hepatitis C: Secondary | ICD-10-CM

## 2015-10-14 NOTE — Patient Instructions (Signed)
Continue Zepatier 1 tablet by mouth daily x 12 weeks separate Magnesium by >2 hours from [place] Will consult with Dr Corliss Parish for Tacrolimus (Prograf) level due to drug interaction with Zepatier Followup labs in 1 week Followup with Dr Linus Salmons in August.

## 2015-10-14 NOTE — Progress Notes (Signed)
HPI: Scott Chavez is a 60 y.o. male presenting for HCV treatment followup after starting Zepatier on 09/23/15.  He has a history of IV drug abuse over 30 years ago.  Has a history of kidney transplant on tacrolimus and mycophenolate. Has immunity to Hep A and Hep B.  Patient denies missed doses and states he takes Zepatier in the AM with his omeprazole and magnesium. He denies current side effects.   No results found for: HCVGENOTYPE, HEPCGENOTYPE  Allergies: Allergies  Allergen Reactions  . Tape Rash    Silk tape    Vitals:    Past Medical History: Past Medical History  Diagnosis Date  . History of renal failure   . Hemodialysis patient [facility])     "on dialysis 1998-2010" 04/25/2013)  . Hypertension   . Hypercholesteremia   . Bladder dysfunction   . Peripheral vascular disease ([place])   . Aorto-iliac disease ([place])   . ESRD (end stage renal disease) [facility])     [facility] , followed by Dr. Moshe Cipro   . GERD (gastroesophageal reflux disease)   . Anemia     pt. not sure, but reports he is taking Fe for one month now.  . Self-catheterizes urinary bladder     pt. choice, but he reports that he desires to do this to avoid retention   . Internal hemorrhoids     Colonoscopy 2010 and anoscopy 2015  . Pneumonia     Social History: Social History   Social History  . Marital Status: Divorced    Spouse Name: N/A  . Number of Children: 3  . Years of Education: N/A   Occupational History  . student    Social History Main Topics  . Smoking status: Light Tobacco Smoker -- 0.50 packs/day for 34 years    Types: E-cigarettes    Last Attempt to Quit: 02/17/2003  . Smokeless tobacco: Never Used     Comment: vapor cigrarette  . Alcohol Use: No  . Drug Use: Yes    Special: Cocaine, Marijuana, Heroin     Comment: 04/25/2013 "abused in the past ; stopped  11/13/2002  . Sexual Activity: Not on file   Other Topics Concern  . Not on file   Social History  Narrative   Patient reports he is divorced. 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   On disability otherwise, he is a vapor smoker, 3 caffeinated beverages daily    Labs: HEP B S AB (no units)  Date Value  08/18/2015 POS*   HEPATITIS B SURFACE AG (no units)  Date Value  08/18/2015 NEGATIVE    No results found for: HCVGENOTYPE, HEPCGENOTYPE  No flowsheet data found.  AST (U/L)  Date Value  04/16/2015 36  11/01/2014 22  10/01/2014 22   ALT (U/L)  Date Value  04/16/2015 31  10/01/2014 16*  04/19/2013 22   INR (no units)  Date Value  04/16/2015 1.03  04/19/2013 0.92    CrCl: CrCl cannot be calculated (Unknown ideal weight.).  Fibrosis Score: F2/F3 as assessed by ultrasound on 09/17/15   Previous Treatment Regimen: N/A  Assessment: HCV: Currently on Zepatier x 12 weeks.  Ultrasound showed mild/moderate F2/F3 fibrosis.  Patient currently on immunosuppressants due to history of kidney transplant  Recommendations: Continue Zepatier 1 tablet by mouth daily x 12 weeks Instructed to separate Magnesium by >2 hours Will consult with Dr Corliss Parish for Tacrolimus level due to drug interaction with  Zepatier Followup labs (CMP, HCV RNA) in 1 week Followup with Dr Linus Salmons in August.   Kem Parkinson, Pharm D.  Pharmacy Resident.  [place] [facility] 10/14/2015, 3:36 PM

## 2015-10-16 ENCOUNTER — Ambulatory Visit (INDEPENDENT_AMBULATORY_CARE_PROVIDER_SITE_OTHER): Payer: Medicare Other | Admitting: Vascular Surgery

## 2015-10-16 ENCOUNTER — Encounter: Payer: Self-pay | Admitting: Vascular Surgery

## 2015-10-16 VITALS — BP 140/80 | HR 65 | Temp 97.9°F | Resp 16 | Ht 70.0 in | Wt 139.0 lb

## 2015-10-16 DIAGNOSIS — Z09 Encounter for follow-up examination after completed treatment for conditions other than malignant neoplasm: Secondary | ICD-10-CM

## 2015-10-16 DIAGNOSIS — T82868A Thrombosis of vascular prosthetic devices, implants and grafts, initial encounter: Secondary | ICD-10-CM | POA: Insufficient documentation

## 2015-10-16 MED FILL — *ZEPATIER 50-100 MG TABLET: 50-100 | 28 days supply | Qty: 28 | Fill #1

## 2015-10-16 NOTE — Progress Notes (Addendum)
POST OPERATIVE OFFICE NOTE    CC:  F/u for surgery  HPI:  This is a 60 y.o. male who is s/p excision of thrombosed left radiocephalic AV fistula segment on 09/24/15 by Dr. Bridgett Larsson.  He returns today without any complaints.  He does have a spitting stitch at the lateral edge of the incision.  He is no longer on dialysis as he has a transplant.  Allergies  Allergen Reactions  . Tape Rash    Silk tape    Current Outpatient Prescriptions  Medication Sig Dispense Refill  . aspirin 325 MG tablet Take 325 mg by mouth 2 (two) times daily.     . citalopram (CELEXA) 10 MG tablet Take 10 mg by mouth daily.    . Elbasvir-Grazoprevir (ZEPATIER) 50-100 MG TABS Take 1 tablet by mouth daily. 28 tablet 2  . finasteride (PROSCAR) 5 MG tablet Take 5 mg by mouth daily before breakfast.     . lidocaine (XYLOCAINE) 5 % ointment Use disposable gloves to apply thin layer to painful area of lower right leg once daily as needed for pain. Place non adherent bandage over application. (Patient taking differently: Apply 1 application topically daily as needed for mild pain. Use disposable gloves to apply thin layer to painful area of lower right leg once daily as needed for pain. Place non adherent bandage over application.) 30 g 0  . magnesium oxide (MAG-OX) 400 MG tablet Take 400 mg by mouth 2 (two) times daily.     . mycophenolate (CELLCEPT) 250 MG capsule Take 500 mg by mouth 2 (two) times daily. 500 mg in the morning and 250 mg in the evening    . NIFEdipine (PROCARDIA-XL/ADALAT-CC/NIFEDICAL-XL) 30 MG 24 hr tablet Take 30 mg by mouth daily.     Marland Kitchen omeprazole (PRILOSEC) 20 MG capsule Take 20 mg by mouth daily. Reported on 06/06/2015    . pregabalin (LYRICA) 50 MG capsule Take 1 capsule (50 mg total) by mouth 3 (three) times daily. 30 capsule 11  . rosuvastatin (CRESTOR) 5 MG tablet Take 5 mg by mouth daily before breakfast.     . tacrolimus (PROGRAF) 1 MG capsule Take 3 mg by mouth 2 (two) times daily.    . tadalafil  (CIALIS) 20 MG tablet Take 20 mg by mouth daily as needed for erectile dysfunction.    . tamsulosin (FLOMAX) 0.4 MG CAPS capsule Take 0.4 mg by mouth daily.    Marland Kitchen VYVANSE 20 MG capsule Take 20 mg by mouth 2 (two) times daily. In the morning and at Noon  0  . zolpidem (AMBIEN) 10 MG tablet Take 10 mg by mouth at bedtime as needed for sleep.     No current facility-administered medications for this visit.     ROS:  See HPI  Physical Exam:  Filed Vitals:   10/16/15 1307  BP: 140/80  Pulse: 65  Temp: 97.9 F (36.6 C)  Resp: 16    Incision:  Well healed with spitting stitch laterally Extremities:  +palpable left radial/ulnar and brachial pulse.    Assessment/Plan:  This is a 60 y.o. male who is s/p:  excision of thrombosed left radiocephalic AV fistula segment on 09/24/15 by Dr. Bridgett Larsson  -pt doing well today -incision is well healed -he is not on dialysis due to transplant -spitting stitch was cut -f/u as needed.   Leontine Locket, PA-C [facility] [phone]  Clinic MD:  Pt seen and examined with Dr. Bridgett Larsson   Addendum  I have  independently interviewed and examined the patient, and I agree with the physician assistant's findings.    [name], MD [facility] [place] Office: [phone] Pager: [phone]  10/16/2015, 1:37 PM

## 2015-10-17 ENCOUNTER — Encounter: Payer: Medicare Other | Admitting: Vascular Surgery

## 2015-10-17 NOTE — Addendum Note (Signed)
Addended by: Mena Goes on: 10/17/2015 05:14 PM   Modules accepted: Orders

## 2015-10-20 ENCOUNTER — Other Ambulatory Visit: Payer: Medicare Other

## 2015-10-20 DIAGNOSIS — B182 Chronic viral hepatitis C: Secondary | ICD-10-CM

## 2015-10-20 LAB — COMPLETE METABOLIC PANEL WITH GFR
ALT: 7 U/L — AB (ref 9–46)
AST: 17 U/L (ref 10–35)
Albumin: 4.2 g/dL (ref 3.6–5.1)
Alkaline Phosphatase: 50 U/L (ref 40–115)
BUN: 19 mg/dL (ref 7–25)
CALCIUM: 9.8 mg/dL (ref 8.6–10.3)
CHLORIDE: 105 mmol/L (ref 98–110)
CO2: 22 mmol/L (ref 20–31)
CREATININE: 1.11 mg/dL (ref 0.70–1.33)
GFR, EST AFRICAN AMERICAN: 84 mL/min (ref >=60)
GFR, EST NON AFRICAN AMERICAN: 72 mL/min (ref >=60)
Glucose, Bld: 94 mg/dL (ref 65–99)
POTASSIUM: 4.3 mmol/L (ref 3.5–5.3)
Sodium: 138 mmol/L (ref 135–146)
Total Bilirubin: 0.6 mg/dL (ref 0.2–1.2)
Total Protein: 7.2 g/dL (ref 6.1–8.1)

## 2015-10-21 LAB — HEPATITIS C RNA QUANTITATIVE
HCV QUANT LOG: 1.3 {Log} — AB (ref <1.18)
HCV Quantitative: 20 IU/mL — ABNORMAL HIGH (ref <15)

## 2015-11-12 MED FILL — *ZEPATIER 50-100 MG TABLET: 50-100 | 28 days supply | Qty: 28 | Fill #2

## 2015-12-04 ENCOUNTER — Ambulatory Visit: Payer: Medicare Other | Admitting: Internal Medicine

## 2015-12-18 ENCOUNTER — Ambulatory Visit (INDEPENDENT_AMBULATORY_CARE_PROVIDER_SITE_OTHER): Payer: Medicare Other | Admitting: Internal Medicine

## 2015-12-18 ENCOUNTER — Encounter: Payer: Self-pay | Admitting: Internal Medicine

## 2015-12-18 VITALS — BP 145/70 | HR 85 | Temp 98.0°F | Ht 70.0 in | Wt 146.0 lb

## 2015-12-18 DIAGNOSIS — K7400 Hepatic fibrosis, unspecified: Secondary | ICD-10-CM

## 2015-12-18 DIAGNOSIS — B182 Chronic viral hepatitis C: Secondary | ICD-10-CM | POA: Diagnosis not present

## 2015-12-18 NOTE — Assessment & Plan Note (Signed)
Completed 12 weeks.  Will check end of treatment lab today and rtc 4 months for [facility]

## 2015-12-18 NOTE — Assessment & Plan Note (Signed)
Discussed fibrosis and liver care.  No indication for [place] screening.

## 2015-12-18 NOTE — Progress Notes (Signed)
   Subjective:    Patient ID: Scott Chavez, male    DOB: 18-Apr-1956, 60 y.o.   MRN: [id number]  HPI Here for follow up of HCV  Has genotype 1a, elastography with F2/3, hepatitis A and B immune.  Started on Zepatier and now completed.  Early viral load down to just 20 copies.  No complaints. On tacrolimus as well for transplant.     Review of Systems  Constitutional: Negative for fatigue.  Skin: Negative for rash.  Neurological: Negative for dizziness and headaches.       Objective:   Physical Exam  Constitutional: He appears well-developed and well-nourished.  Eyes: No scleral icterus.  Cardiovascular: Normal rate, regular rhythm and normal heart sounds.   Skin: No rash noted.          Assessment & Plan:

## 2015-12-19 LAB — HEPATITIS C RNA QUANTITATIVE: HCV QUANT: NOT DETECTED [IU]/mL (ref <15)

## 2015-12-28 ENCOUNTER — Emergency Department (HOSPITAL_COMMUNITY)
Admission: EM | Admit: 2015-12-28 | Discharge: 2015-12-28 | Disposition: A | Discharge status: Home / Self Care | Payer: Medicare Other | Attending: Emergency Medicine | Admitting: Emergency Medicine

## 2015-12-28 ENCOUNTER — Emergency Department (HOSPITAL_COMMUNITY): Payer: Medicare Other

## 2015-12-28 ENCOUNTER — Encounter (HOSPITAL_COMMUNITY): Payer: Self-pay

## 2015-12-28 DIAGNOSIS — R300 Dysuria: Secondary | ICD-10-CM | POA: Diagnosis present

## 2015-12-28 DIAGNOSIS — Z992 Dependence on renal dialysis: Secondary | ICD-10-CM | POA: Diagnosis not present

## 2015-12-28 DIAGNOSIS — Z7982 Long term (current) use of aspirin: Secondary | ICD-10-CM | POA: Insufficient documentation

## 2015-12-28 DIAGNOSIS — N390 Urinary tract infection, site not specified: Secondary | ICD-10-CM | POA: Diagnosis not present

## 2015-12-28 DIAGNOSIS — N186 End stage renal disease: Secondary | ICD-10-CM | POA: Diagnosis not present

## 2015-12-28 DIAGNOSIS — F17210 Nicotine dependence, cigarettes, uncomplicated: Secondary | ICD-10-CM | POA: Insufficient documentation

## 2015-12-28 DIAGNOSIS — I120 Hypertensive chronic kidney disease with stage 5 chronic kidney disease or end stage renal disease: Secondary | ICD-10-CM | POA: Insufficient documentation

## 2015-12-28 DIAGNOSIS — R319 Hematuria, unspecified: Secondary | ICD-10-CM | POA: Diagnosis not present

## 2015-12-28 LAB — BASIC METABOLIC PANEL
Anion gap: 11 (ref 5–15)
BUN: 13 mg/dL (ref 6–20)
CALCIUM: 9.8 mg/dL (ref 8.9–10.3)
CO2: 22 mmol/L (ref 22–32)
CREATININE: 1.41 mg/dL — AB (ref 0.61–1.24)
Chloride: 99 mmol/L — ABNORMAL LOW (ref 101–111)
GFR calc non Af Amer: 53 mL/min — ABNORMAL LOW (ref >60)
Glucose, Bld: 132 mg/dL — ABNORMAL HIGH (ref 65–99)
Potassium: 3.5 mmol/L (ref 3.5–5.1)
SODIUM: 132 mmol/L — AB (ref 135–145)

## 2015-12-28 LAB — URINALYSIS, ROUTINE W REFLEX MICROSCOPIC
Glucose, UA: NEGATIVE mg/dL
KETONES UR: 15 mg/dL — AB
NITRITE: NEGATIVE
Protein, ur: 30 mg/dL — AB
Specific Gravity, Urine: 1.022 (ref 1.005–1.030)
pH: 5.5 (ref 5.0–8.0)

## 2015-12-28 LAB — CBC
HCT: 41.3 % (ref 39.0–52.0)
Hemoglobin: 13.7 g/dL (ref 13.0–17.0)
MCH: 29.1 pg (ref 26.0–34.0)
MCHC: 33.2 g/dL (ref 30.0–36.0)
MCV: 87.7 fL (ref 78.0–100.0)
PLATELETS: 133 10*3/uL — AB (ref 150–400)
RBC: 4.71 MIL/uL (ref 4.22–5.81)
RDW: 13.4 % (ref 11.5–15.5)
WBC: 7.8 10*3/uL (ref 4.0–10.5)

## 2015-12-28 LAB — I-STAT CG4 LACTIC ACID, ED: LACTIC ACID, VENOUS: 1.27 mmol/L (ref 0.5–1.9)

## 2015-12-28 LAB — URINE MICROSCOPIC-ADD ON

## 2015-12-28 MED ORDER — SODIUM CHLORIDE 0.9 % IV BOLUS (SEPSIS)
1000.0000 mL | Freq: Once | INTRAVENOUS | Status: AC
  Administered 2015-12-28: 1000 mL via INTRAVENOUS

## 2015-12-28 MED ORDER — CEPHALEXIN 500 MG PO CAPS: 500.0000 mg | ORAL_CAPSULE | Freq: Four times a day (QID) | ORAL | 0 refills | Status: DC

## 2015-12-28 MED ORDER — DEXTROSE 5 % IV SOLN
1.0000 g | Freq: Once | INTRAVENOUS | Status: AC
  Administered 2015-12-28: 1 g via INTRAVENOUS
  Filled 2015-12-28: qty 10

## 2015-12-28 NOTE — Discharge Instructions (Signed)
Take Keflex (antibiotics) as prescribed. Take tylenol as needed for fever or pain. Please follow up with Dr. Moshe Cipro as soon as possible. Return to the ER for new or worsening symptoms.

## 2015-12-28 NOTE — ED Provider Notes (Signed)
[facility] DEPT Provider Note   CSN: [id number] Arrival date & time: 12/28/15  1511  History   Chief Complaint Chief Complaint  Patient presents with  . Dysuria   HPI   Scott Chavez is an 60 y.o. male with history of ESRD now s/p renal transplant, Hep C (just completed therapy), PAD, HTN, HLD, who presents to the ED for evaluation of fever, chills, and dysuria. He states his symptoms started four days ago. He reports he has had similar symptoms with UTI in the past. States temp at home earlier today was 102.36F and he took tylenol and ASA prior to coming to the ED. In the ED he states his chills feel better. He states his urine has a strong foul odor. Denies abdominal pain. Denies nausea, vomiting, diarrhea. Denies chest pain or SOB. He does not remember what antibiotics have worked for his UTI in the past.  Past Medical History:  Diagnosis Date  . Anemia    pt. not sure, but reports he is taking Fe for one month now.  . Aorto-iliac disease ([place])   . Bladder dysfunction   . ESRD (end stage renal disease) [facility])    transplant- 12/[facility]/2010[facility] , followed by Dr. Moshe Cipro   . GERD (gastroesophageal reflux disease)   . Hemodialysis patient [facility])    "on dialysis 1998-2010" 04/25/2013)  . History of renal failure   . Hypercholesteremia   . Hypertension   . Internal hemorrhoids    Colonoscopy 2010 and anoscopy 2015  . Peripheral vascular disease ([place])   . Pneumonia   . Self-catheterizes urinary bladder    pt. choice, but he reports that he desires to do this to avoid retention     Patient Active Problem List   Diagnosis Date Noted  . Liver fibrosis ([place]) 12/18/2015  . Arteriovenous fistula thrombosis ([place]) 10/16/2015  . Chronic hepatitis C without hepatic coma ([place]) 09/03/2015  . Atherosclerosis of native arteries of extremities with intermittent claudication, right leg ([place]) 04/17/2015  . Pain in joint, lower leg 09/13/2014  . Hemorrhoids, internal, with bleeding  01/29/2014  . Pain in limb 08/17/2013  . Peripheral vascular disease, unspecified ([place]) 06/08/2013  . Redness of skin-Calf to foot LEFT 05/25/2013  . Tenderness in limb-Calf to foot LEFT 05/25/2013  . Swelling of limb-Calf to foot LEFT 05/25/2013  . Postoperative pain 05/02/2013  . PAD (peripheral artery disease) ([place]) 04/24/2013  . Preop cardiovascular exam 03/27/2013  . Essential hypertension 03/27/2013  . Hyperlipidemia 03/27/2013  . Atherosclerosis of native arteries of extremity with intermittent claudication ([place]) 02/16/2013  . Transplant recipient 02/16/2013    Past Surgical History:  Procedure Laterality Date  . ABDOMINAL AORTAGRAM N/A 03/09/2013   Procedure: ABDOMINAL Maxcine Ham;  Surgeon: Elam Dutch, MD;  Location: [facility] CATH LAB;  Service: Cardiovascular;  Laterality: N/A;  . AV FISTULA PLACEMENT Left 1998   "removed 2011" (04/25/2013)  . AV FISTULA REPAIR Left    removal with partial vein removed  . COLONOSCOPY    . ENDARTERECTOMY FEMORAL Right 04/17/2015   Procedure: ENDARTERECTOMY RIGHT ILIOFEMORAL WITH PROFUNDOPLASTY;  Surgeon: Conrad [place], MD;  Location: [facility];  Service: Vascular;  Laterality: Right;  . FEMORAL-POPLITEAL BYPASS GRAFT Left 04/24/2013   Procedure: BYPASS GRAFT COMMON FEMORALARTERY-BELOW KNEE POPLITEAL ARTERY WITH GREATER SAPHENOUS VEIN;  Surgeon: Conrad Lyndon, MD;  Location: [place] [facility];  Service: Vascular;  Laterality: Left;  . FEMORAL-POPLITEAL BYPASS GRAFT Right 04/17/2015   Procedure: BYPASS GRAFT RIGHT FEMORALTO BELOW KNEE POPLITEAL ARTERY  USING RIGHT NON REVERSED GREATER SAPPHENOUS VEIN;  Surgeon: Conrad [place], MD;  Location: [facility];  Service: Vascular;  Laterality: Right;  . INTRAOPERATIVE ARTERIOGRAM Left 04/24/2013   Procedure: INTRA OPERATIVE ARTERIOGRAM;  Surgeon: Conrad Williford, MD;  Location: [place];  Service: Vascular;  Laterality: Left;  . LOWER EXTREMITY ANGIOGRAM Bilateral 03/09/2013   Procedure: LOWER EXTREMITY ANGIOGRAM;  Surgeon: Elam Dutch, MD;  Location: [facility] CATH LAB;  Service: Cardiovascular;  Laterality: Bilateral;  . LOWER EXTREMITY ANGIOGRAM Right 10/25/2013   Procedure: LOWER EXTREMITY ANGIOGRAM;  Surgeon: Conrad Barryton, MD;  Location: [facility] CATH LAB;  Service: Cardiovascular;  Laterality: Right;  . LOWER EXTREMITY ANGIOGRAM Right 12/13/2013   Procedure: LOWER EXTREMITY ANGIOGRAM;  Surgeon: Conrad [place], MD;  Location: [facility] CATH LAB;  Service: Cardiovascular;  Laterality: Right;  . NEPHRECTOMY RECIPIENT  2010  . PATCH ANGIOPLASTY Right 04/17/2015   Procedure: Rueben Bash PATCH ANGIOPLASTY, RIGHT ILEOFEMORAL;  Surgeon: Conrad Belton, MD;  Location: [place];  Service: Vascular;  Laterality: Right;  . PERIPHERAL VASCULAR CATHETERIZATION N/A 04/10/2015   Procedure: Abdominal Aortogram;  Surgeon: Conrad Abernathy, MD;  Location: [place] CV LAB;  Service: Cardiovascular;  Laterality: N/A;  . s/p cadaveric renal transplant  December 2010   [place] Left 09/24/2015   Procedure: EXCISION OF THROMBOSED RADIOCEPHALIC ARTERIOVENOUS FISTULA;  Surgeon: Conrad Hope, MD;  Location: [place];  Service: Vascular;  Laterality: Left;  Marland Kitchen VEIN HARVEST Right 04/17/2015   Procedure: RIGHT GREATER SAPPHENOUS VEIN HARVEST ;  Surgeon: Conrad [place], MD;  Location: [place];  Service: Vascular;  Laterality: Right;       Home Medications    Prior to Admission medications   Medication Sig Start Date End Date Taking? Authorizing Provider  aspirin 325 MG tablet Take 325 mg by mouth 2 (two) times daily.    Yes Historical Provider, MD  citalopram (CELEXA) 10 MG tablet Take 10 mg by mouth daily. 09/18/15  Yes Historical Provider, MD  finasteride (PROSCAR) 5 MG tablet Take 5 mg by mouth daily before breakfast.    Yes Historical Provider, MD  magnesium oxide (MAG-OX) 400 MG tablet Take 400 mg by mouth 2 (two) times daily.    Yes Historical Provider, MD  mycophenolate (CELLCEPT) 250 MG capsule Take 250-500 mg by mouth See admin  instructions. 500 mg in the morning and 250 mg in the evening   Yes Historical Provider, MD  NIFEdipine (PROCARDIA-XL/ADALAT-CC/NIFEDICAL-XL) 30 MG 24 hr tablet Take 30 mg by mouth at bedtime.    Yes Historical Provider, MD  omeprazole (PRILOSEC) 20 MG capsule Take 20 mg by mouth daily. Reported on 06/06/2015   Yes Historical Provider, MD  pregabalin (LYRICA) 50 MG capsule Take 1 capsule (50 mg total) by mouth 3 (three) times daily. 06/06/15  Yes Conrad , MD  rosuvastatin (CRESTOR) 5 MG tablet Take 5 mg by mouth at bedtime.    Yes Historical Provider, MD  tacrolimus (PROGRAF) 1 MG capsule Take 3 mg by mouth 2 (two) times daily.   Yes Historical Provider, MD  tamsulosin (FLOMAX) 0.4 MG CAPS capsule Take 0.4 mg by mouth daily.   Yes Historical Provider, MD  VYVANSE 20 MG capsule Take 20 mg by mouth 2 (two) times daily. In the morning and at [facility] 08/25/15  Yes Historical Provider, MD  zolpidem (AMBIEN) 10 MG tablet Take 10 mg by mouth at bedtime as needed for sleep.   Yes Historical  Provider, MD  tadalafil (CIALIS) 20 MG tablet Take 20 mg by mouth daily as needed for erectile dysfunction.    Historical Provider, MD    Family History Family History  Problem Relation Age of Onset  . Hypertension Father   . Other Father     amputation  . Hypertension Mother     Social History Social History  Substance Use Topics  . Smoking status: Light Tobacco Smoker    Packs/day: 0.50    Years: 34.00    Types: E-cigarettes    Last attempt to quit: 02/17/2003  . Smokeless tobacco: Never Used     Comment: vapor cigrarette  . Alcohol use No     Allergies   Tape   Review of Systems Review of Systems 10 Systems reviewed and are negative for acute change except as noted in the HPI.  Physical Exam Updated Vital Signs BP 133/92   Pulse 66   Temp 99.5 F (37.5 C) (Oral)   Resp 18   SpO2 100%   Physical Exam  Constitutional: He is oriented to person, place, and time.  HENT:  Right Ear:  External ear normal.  Left Ear: External ear normal.  Nose: Nose normal.  Mouth/Throat: Oropharynx is clear and moist. No oropharyngeal exudate.  Eyes: Conjunctivae are normal.  Neck: Neck supple.  Cardiovascular: Normal rate, regular rhythm, normal heart sounds and intact distal pulses.   Pulmonary/Chest: Effort normal and breath sounds normal. No respiratory distress. He has no wheezes.  Abdominal: Soft. Bowel sounds are normal. He exhibits no distension. There is no tenderness. There is no rebound and no guarding.  No CVA tenderness  Musculoskeletal: He exhibits no edema.  Lymphadenopathy:    He has no cervical adenopathy.  Neurological: He is alert and oriented to person, place, and time. No cranial nerve deficit.  Skin: Skin is warm and dry.  Psychiatric: He has a normal mood and affect.  Nursing note and vitals reviewed.    ED Treatments / Results  Labs (all labs ordered are listed, but only abnormal results are displayed) Labs Reviewed  URINALYSIS, ROUTINE W REFLEX MICROSCOPIC (NOT AT [facility]) - Abnormal; Notable for the following:       Result Value   Color, Urine AMBER (*)    APPearance TURBID (*)    Hgb urine dipstick MODERATE (*)    Bilirubin Urine SMALL (*)    Ketones, ur 15 (*)    Protein, ur 30 (*)    Leukocytes, UA LARGE (*)    All other components within normal limits  BASIC METABOLIC PANEL - Abnormal; Notable for the following:    Sodium 132 (*)    Chloride 99 (*)    Glucose, Bld 132 (*)    Creatinine, Ser 1.41 (*)    GFR calc non Af Amer 53 (*)    All other components within normal limits  CBC - Abnormal; Notable for the following:    Platelets 133 (*)    All other components within normal limits  URINE MICROSCOPIC-ADD ON - Abnormal; Notable for the following:    Squamous Epithelial / LPF 0-5 (*)    Bacteria, UA MANY (*)    All other components within normal limits  URINE CULTURE  CULTURE, BLOOD (ROUTINE X 2)  CULTURE, BLOOD (ROUTINE X 2)  I-STAT CG4  LACTIC ACID, ED    EKG  EKG Interpretation None       Radiology Ct Renal Stone Study  Result Date: 12/28/2015 CLINICAL DATA:  Urinary  pressure for 5 days. History of end-stage renal disease with transplant. EXAM: CT ABDOMEN AND PELVIS WITHOUT CONTRAST TECHNIQUE: Multidetector CT imaging of the abdomen and pelvis was performed following the standard protocol without IV contrast. COMPARISON:  None. FINDINGS: Lower chest:  Unremarkable. Hepatobiliary: No focal abnormality in the liver on this study without intravenous contrast. There is no evidence for gallstones, gallbladder wall thickening, or pericholecystic fluid. No intrahepatic or extrahepatic biliary dilation. Pancreas: No focal mass lesion. No dilatation of the main duct. No intraparenchymal cyst. No peripancreatic edema. Spleen: No splenomegaly. No focal mass lesion. Adrenals/Urinary Tract: No adrenal nodule or mass. Native kidneys are markedly atrophic with 2 cm cyst identified upper pole native left kidney. Transplant kidney identified in the right pelvis. No hydronephrosis in the transplant kidney. No stones identified in the transplant kidney. No evidence for right transplant hydroureter. Urinary bladder is nondistended, but on coronal and sagittal reconstructions, the wall appears ill-defined and thickened to a slightly greater degree than expected for the level of underdistention. Stomach/Bowel: Stomach is nondistended. No gastric wall thickening. No evidence of outlet obstruction. Duodenum is normally positioned as is the ligament of Treitz. No small bowel wall thickening. No small bowel dilatation. The terminal ileum is normal. The appendix is normal. No gross colonic mass. No colonic wall thickening. No substantial diverticular change. Vascular/Lymphatic: There is abdominal aortic atherosclerosis without aneurysm. There is no gastrohepatic or hepatoduodenal ligament lymphadenopathy. No intraperitoneal or retroperitoneal lymphadenopathy.  No pelvic sidewall lymphadenopathy. Reproductive: The prostate gland and seminal vesicles have normal imaging features. Other: No intraperitoneal free fluid. Musculoskeletal: Heterogeneous bony mineralization likely related to the history of chronic renal insufficiency. IMPRESSION: 1. Ill-defined bladder wall appears thickened for the degree of under distention. Bladder infection/inflammation could have this appearance. 2. Right pelvic transplant kidney without hydronephrosis or stone disease. 3. Atrophied native kidneys bilaterally with 2 cm cyst identified upper pole native left kidney. 4. Abdominal aortic atherosclerosis. Electronically Signed   By: Misty Stanley M.D.   On: 12/28/2015 18:25    Procedures Procedures (including critical care time)  Medications Ordered in ED Medications  sodium chloride 0.9 % bolus 1,000 mL (not administered)  cefTRIAXone (ROCEPHIN) 1 g in dextrose 5 % 50 mL IVPB (not administered)     Initial Impression / Assessment and Plan / ED Course  I have reviewed the triage vital signs and the nursing notes.  Pertinent labs & imaging results that were available during my care of the patient were reviewed by me and considered in my medical decision making (see chart for details).  Clinical Course   Pt is an 60 y.o. male s/p kidney transplant here with UTI. Urine grossly infected with many bacteria, TNTC WBC, moderate hemoglobin, 15 ketones, 30 protein. White count normal. Some AKI with a creatinine of 1.41. CT renal study was obtained and shows some bladder inflammation and no abnormalities with transplanted kidney.I spoke with pharmacy regarding recs for antibiotics with pt's transplanted kidney and AKI. Will give rocephin. Urine and blood cultures ordered. Given his transplant history I would consider this a complicated UTI and I did discuss a hospital admission several times with pt. He would like to go home and pursue outpatient therapy. He does understand reasons to  return to the ED including n/v, high fever, severe pain, passing out, etc.  Final Clinical Impressions(s) / ED Diagnoses   Final diagnoses:  Urinary tract infection with hematuria, site unspecified   New Prescriptions Discharge Medication List as of 12/28/2015  7:33 PM  START taking these medications   Details  cephALEXin (KEFLEX) 500 MG capsule Take 1 capsule (500 mg total) by mouth 4 (four) times daily., Starting Sun 12/28/2015, Print         Anne Ng, PA-C 12/29/15 1335    Dorie Rank, MD 12/30/15 1104

## 2015-12-28 NOTE — ED Triage Notes (Signed)
Onset 5 days urinary pressure, foul smell to urine.  Onset last night fever, chills.

## 2015-12-28 NOTE — ED Notes (Signed)
Will start Rocephin after blood cultures drawn. Called phlebotomy to obtain cultures and lactic.

## 2015-12-28 NOTE — ED Notes (Signed)
Patient transported to CT 

## 2015-12-28 NOTE — ED Triage Notes (Signed)
Pt took ASA 325mg  x 2 tabs and Ibuprofen 200 mg x 3 tabs @ 2 hours PTA.

## 2015-12-30 ENCOUNTER — Telehealth: Payer: Self-pay | Admitting: *Deleted

## 2015-12-30 LAB — URINE CULTURE

## 2015-12-30 NOTE — Telephone Encounter (Signed)
Patient called to get his last Hep C lab results. Advised him they show not detected and he asked what his next step will be. Advised him will ask the doctor and give him a call back.

## 2015-12-30 NOTE — Telephone Encounter (Signed)
As discussed, recheck it in 4 months to verify cure. thanks

## 2015-12-30 NOTE — Telephone Encounter (Signed)
Patient given follow up appts

## 2015-12-31 ENCOUNTER — Telehealth (HOSPITAL_BASED_OUTPATIENT_CLINIC_OR_DEPARTMENT_OTHER): Payer: Self-pay | Admitting: Emergency Medicine

## 2015-12-31 NOTE — Telephone Encounter (Signed)
Post ED Visit - Positive Culture Follow-up  Culture report reviewed by antimicrobial stewardship pharmacist:  []  Elenor Quinones, Pharm.D. []  Heide Guile, Pharm.D., BCPS []  Parks Neptune, Pharm.D. []  Alycia Rossetti, Pharm.D., BCPS []  [place], Pharm.D., BCPS, AAHIVP []  Legrand Como, Pharm.D., BCPS, AAHIVP []  Milus Glazier, Pharm.D. []  Rob Evette Doffing, Pharm.D. Arrie Senate PharmD  Positive urine culture Treated with cephalexin, organism sensitive to the same and no further patient follow-up is required at this time.  Hazle Nordmann 12/31/2015, 10:05 AM

## 2016-01-02 LAB — CULTURE, BLOOD (ROUTINE X 2)
CULTURE: NO GROWTH
CULTURE: NO GROWTH

## 2016-01-07 ENCOUNTER — Other Ambulatory Visit: Payer: Self-pay

## 2016-01-07 DIAGNOSIS — M79609 Pain in unspecified limb: Secondary | ICD-10-CM

## 2016-01-08 ENCOUNTER — Other Ambulatory Visit: Payer: Self-pay

## 2016-01-08 DIAGNOSIS — I739 Peripheral vascular disease, unspecified: Secondary | ICD-10-CM

## 2016-01-08 MED ORDER — PREGABALIN 50 MG PO CAPS: 50.0000 mg | ORAL_CAPSULE | Freq: Three times a day (TID) | ORAL | 5 refills | Status: DC

## 2016-01-19 ENCOUNTER — Encounter: Payer: Self-pay | Admitting: Vascular Surgery

## 2016-01-22 NOTE — Progress Notes (Signed)
Established Previous Bypass   History of Present Illness  Scott Chavez is a 60 y.o. (May 01, 1956) male  who presents with chief complaint: resolving neuropathic pain in R foot.  Previous operation(s) include:   1.  L iliofem EA w/ BPA, L CFA to BK pop BPG with NR ips GSV (04/24/13) 2.  PTA+S R CIA and L CIA (10/25/13) 3.  OA+PTA R F-P (12/13/13)  (ERROR in op note in laterality) 4.  R iliofem EA w/ BPA, extended R profundaplasty, R CFA to BK pop BPG w/ NR ips GSV (04/17/15) 5.  Excision of thrombosed L RC AVF  The patient's symptoms have improved.  Leg radicular sx remained resolved with mildly altered sensation in L medial shin.  R lower leg has some radicular sx similar to L but these have also improved.  His intermittent claudication sx are resolved.  The patient's treatment regimen currently included: maximal medical management and walking.  The patient's PMH, PSH, SH, and FamHx are unchanged from 10/16/15.  Current Outpatient Prescriptions  Medication Sig Dispense Refill  . aspirin 325 MG tablet Take 325 mg by mouth 2 (two) times daily.     . cephALEXin (KEFLEX) 500 MG capsule Take 1 capsule (500 mg total) by mouth 4 (four) times daily. 28 capsule 0  . citalopram (CELEXA) 10 MG tablet Take 10 mg by mouth daily.    . finasteride (PROSCAR) 5 MG tablet Take 5 mg by mouth daily before breakfast.     . magnesium oxide (MAG-OX) 400 MG tablet Take 400 mg by mouth 2 (two) times daily.     . mycophenolate (CELLCEPT) 250 MG capsule Take 250-500 mg by mouth See admin instructions. 500 mg in the morning and 250 mg in the evening    . NIFEdipine (PROCARDIA-XL/ADALAT-CC/NIFEDICAL-XL) 30 MG 24 hr tablet Take 30 mg by mouth at bedtime.     Marland Kitchen omeprazole (PRILOSEC) 20 MG capsule Take 20 mg by mouth daily. Reported on 06/06/2015    . pregabalin (LYRICA) 50 MG capsule Take 1 capsule (50 mg total) by mouth 3 (three) times daily. 30 capsule 5  . rosuvastatin (CRESTOR) 5 MG tablet Take 5 mg by  mouth at bedtime.     . tacrolimus (PROGRAF) 1 MG capsule Take 3 mg by mouth 2 (two) times daily.    . tadalafil (CIALIS) 20 MG tablet Take 20 mg by mouth daily as needed for erectile dysfunction.    . tamsulosin (FLOMAX) 0.4 MG CAPS capsule Take 0.4 mg by mouth daily.    Marland Kitchen VYVANSE 20 MG capsule Take 20 mg by mouth 2 (two) times daily. In the morning and at Noon  0  . zolpidem (AMBIEN) 10 MG tablet Take 10 mg by mouth at bedtime as needed for sleep.     No current facility-administered medications for this visit.     On ROS today: no rest pain, RLE radicular sx attenuated   Physical Examination  Vitals:   01/23/16 1124 01/23/16 1127  BP: (!) 163/90 (!) 155/94  Pulse: (!) 58   Resp: 18   Temp: 97.5 F (36.4 C)   TempSrc: Oral   SpO2: 100%   Weight: 144 lb 8 oz (65.5 kg)   Height: 5\' 10"  (1.778 m)     Body mass index is 20.73 kg/m.  General: A&O x 3, WD, thin  Pulmonary: Sym exp, good air movt, CTAB, no rales, rhonchi, & wheezing  Cardiac: RRR, Nl S1, S2, no Murmurs, rubs or gallops  Vascular: Vessel Right Left  Radial Palpable Palpable  Brachial Palpable Palpable  Carotid Palpable, without bruit Palpable, without bruit  Aorta Not palpable N/A  Femoral Palpable Palpable  Popliteal Not palpable Not palpable  PT Palpable Palpable  DP Palpable Palpable   Gastrointestinal: soft, NTND, no G/R, no HSM, no masses, no CVAT B  Musculoskeletal: M/S 5/5 throughout , Extremities without ischemic changes, all incisions are healed, edema resolved in both legs  Neurologic: Pain and light touch intact in extremities , Motor exam as listed above   Non-Invasive Vascular Imaging  ABI (Date: 01/22/2016)  R:   ABI: 0.90 (0.96),   DP: bi  PT: bi  TBI:  ND  L:   ABI: 0.99 (1.05),   DP: bi  PT: bi  TBI: ND   Aortoiliac Duplex (Date: 09/19/2015)  Ao: 66 c/s (mono)  R iliac: 163-189 c/s (in-stent)  L iliac: 68-84 c/s (in-stent)  B Bypass Duplex  (Date: 09/19/2015)  Widely patent bilateral fem-pop bypasses  Distal EIA 391 c/s (not seen on aortoiliac duplex)   Medical Decision Making  KEENON LEITZEL is a 60 y.o. ([birth year]) male  who presents with: resolved BLE intermittent claudication, s/p B CIA PTA+S, B fem-pop bypasses with GSV, CKT  .  Based on the patient's vascular studies and examination, I have offered the patient: BLE ABI, bypass duplex, and aortoiliac duplex in 3 months.    I discussed in depth with the patient the nature of atherosclerosis, and emphasized the importance of maximal medical management including strict control of blood pressure, blood glucose, and lipid levels, obtaining regular exercise, and cessation of smoking.    The patient is aware that without maximal medical management the underlying atherosclerotic disease process will progress, limiting the benefit of any interventions.  I discussed in depth with the patient the need for long term surveillance to improve the primary assisted patency of his bypass.  The patient agrees to cooperate with such.  The patient is currently on a statin: not medically indicated.  The patient is currently on an anti-platelet: aspirin.  Thank you for allowing [place] to participate in this patient's care.   [name], MD [facility] Office: [phone] Pager: [phone]

## 2016-01-23 ENCOUNTER — Other Ambulatory Visit: Payer: Self-pay | Admitting: Vascular Surgery

## 2016-01-23 ENCOUNTER — Ambulatory Visit (INDEPENDENT_AMBULATORY_CARE_PROVIDER_SITE_OTHER)
Admission: RE | Admit: 2016-01-23 | Discharge: 2016-01-23 | Disposition: A | Discharge status: Home / Self Care | Payer: Medicare Other | Source: Ambulatory Visit | Attending: Vascular Surgery | Admitting: Vascular Surgery

## 2016-01-23 ENCOUNTER — Ambulatory Visit (INDEPENDENT_AMBULATORY_CARE_PROVIDER_SITE_OTHER): Payer: Medicare Other | Admitting: Vascular Surgery

## 2016-01-23 ENCOUNTER — Encounter: Payer: Self-pay | Admitting: Vascular Surgery

## 2016-01-23 ENCOUNTER — Ambulatory Visit (HOSPITAL_COMMUNITY)
Admission: RE | Admit: 2016-01-23 | Discharge: 2016-01-23 | Disposition: A | Discharge status: Home / Self Care | Payer: Medicare Other | Source: Ambulatory Visit | Attending: Vascular Surgery | Admitting: Vascular Surgery

## 2016-01-23 VITALS — BP 155/94 | HR 58 | Temp 97.5°F | Resp 18 | Ht 70.0 in | Wt 144.5 lb

## 2016-01-23 DIAGNOSIS — Z09 Encounter for follow-up examination after completed treatment for conditions other than malignant neoplasm: Secondary | ICD-10-CM | POA: Diagnosis present

## 2016-01-23 DIAGNOSIS — T82868A Thrombosis of vascular prosthetic devices, implants and grafts, initial encounter: Secondary | ICD-10-CM | POA: Diagnosis present

## 2016-01-23 DIAGNOSIS — I70211 Atherosclerosis of native arteries of extremities with intermittent claudication, right leg: Secondary | ICD-10-CM

## 2016-01-23 NOTE — Addendum Note (Signed)
Addended by: Kaleen Mask on: 01/23/2016 03:25 PM   Modules accepted: Orders

## 2016-01-30 ENCOUNTER — Encounter: Payer: Self-pay | Admitting: Internal Medicine

## 2016-02-16 ENCOUNTER — Ambulatory Visit (INDEPENDENT_AMBULATORY_CARE_PROVIDER_SITE_OTHER): Payer: Medicare Other | Admitting: Internal Medicine

## 2016-02-16 ENCOUNTER — Encounter: Payer: Self-pay | Admitting: Internal Medicine

## 2016-02-16 VITALS — BP 118/76 | HR 79 | Ht 70.0 in | Wt 146.1 lb

## 2016-02-16 DIAGNOSIS — I70213 Atherosclerosis of native arteries of extremities with intermittent claudication, bilateral legs: Secondary | ICD-10-CM

## 2016-02-16 NOTE — Progress Notes (Signed)
Cardiology Office Note   Date:  02/16/2016   ID:  Scott Chavez, DOB 17-Sep-1955, MRN [id number]  PCP:  Louis Meckel, MD  Cardiologist:   Dorris Carnes, MD    F/U of HTN and HL     History of Present Illness: Scott Chavez is a 60 y.o. male with a history of PVOD, CKD (s/p kidney tx), HTN, HL  I saw him in July 2016    Pt had some heaviness in chest  Depends on stress and diet     No PND   No dizziness   Followed by Dr Bridgett Larsson       Outpatient Medications Prior to Visit  Medication Sig Dispense Refill  . aspirin 325 MG tablet Take 325 mg by mouth 2 (two) times daily.     . citalopram (CELEXA) 10 MG tablet Take 10 mg by mouth daily.    . finasteride (PROSCAR) 5 MG tablet Take 5 mg by mouth daily before breakfast.     . magnesium oxide (MAG-OX) 400 MG tablet Take 400 mg by mouth 2 (two) times daily.     . mycophenolate (CELLCEPT) 250 MG capsule Take 250-500 mg by mouth See admin instructions. 500 mg in the morning and 250 mg in the evening    . NIFEdipine (PROCARDIA-XL/ADALAT-CC/NIFEDICAL-XL) 30 MG 24 hr tablet Take 30 mg by mouth at bedtime.     Marland Kitchen omeprazole (PRILOSEC) 20 MG capsule Take 20 mg by mouth daily. Reported on 06/06/2015    . pregabalin (LYRICA) 50 MG capsule Take 1 capsule (50 mg total) by mouth 3 (three) times daily. 30 capsule 5  . rosuvastatin (CRESTOR) 5 MG tablet Take 5 mg by mouth at bedtime.     . tacrolimus (PROGRAF) 1 MG capsule Take 3 mg by mouth 2 (two) times daily.    . tadalafil (CIALIS) 20 MG tablet Take 20 mg by mouth daily as needed for erectile dysfunction.    . tamsulosin (FLOMAX) 0.4 MG CAPS capsule Take 0.4 mg by mouth daily.    Marland Kitchen VYVANSE 20 MG capsule Take 20 mg by mouth 2 (two) times daily. In the morning and at Noon  0  . zolpidem (AMBIEN) 10 MG tablet Take 10 mg by mouth at bedtime as needed for sleep.    . cephALEXin (KEFLEX) 500 MG capsule Take 1 capsule (500 mg total) by mouth 4 (four) times daily. (Patient not taking: Reported on  02/16/2016) 28 capsule 0   No facility-administered medications prior to visit.      Allergies:   Tape   Past Medical History:  Diagnosis Date  . Anemia    pt. not sure, but reports he is taking Fe for one month now.  . Aorto-iliac disease ([place])   . Bladder dysfunction   . ESRD (end stage renal disease) [facility])    [facility]- [facility] , followed by Dr. Moshe Cipro   . GERD (gastroesophageal reflux disease)   . Hemodialysis patient [facility])    "on dialysis 1998-2010" 04/25/2013)  . Hepatitis C   . History of renal failure   . Hypercholesteremia   . Hypertension   . Internal hemorrhoids    Colonoscopy 2010 and anoscopy 2015  . Peripheral vascular disease ([place])   . Pneumonia   . Self-catheterizes urinary bladder    pt. choice, but he reports that he desires to do this to avoid retention     Past Surgical History:  Procedure Laterality Date  . ABDOMINAL AORTAGRAM N/A 03/09/2013  Procedure: ABDOMINAL AORTAGRAM;  Surgeon: Elam Dutch, MD;  Location: [facility] CATH LAB;  Service: Cardiovascular;  Laterality: N/A;  . AV FISTULA PLACEMENT Left 1998   "removed 2011" (04/25/2013)  . AV FISTULA REPAIR Left    removal with partial vein removed  . COLONOSCOPY    . ENDARTERECTOMY FEMORAL Right 04/17/2015   Procedure: ENDARTERECTOMY RIGHT ILIOFEMORAL WITH PROFUNDOPLASTY;  Surgeon: Conrad [place], MD;  Location: [place];  Service: Vascular;  Laterality: Right;  . FEMORAL-POPLITEAL BYPASS GRAFT Left 04/24/2013   Procedure: BYPASS GRAFT COMMON FEMORALARTERY-BELOW KNEE POPLITEAL ARTERY WITH GREATER SAPHENOUS VEIN;  Surgeon: Conrad Genoa, MD;  Location: [place];  Service: Vascular;  Laterality: Left;  . FEMORAL-POPLITEAL BYPASS GRAFT Right 04/17/2015   Procedure: BYPASS GRAFT RIGHT FEMORALTO BELOW KNEE POPLITEAL ARTERY USING RIGHT NON REVERSED GREATER SAPPHENOUS VEIN;  Surgeon: Conrad [place], MD;  Location: [place];  Service: Vascular;  Laterality: Right;  . INTRAOPERATIVE ARTERIOGRAM Left  04/24/2013   Procedure: INTRA OPERATIVE ARTERIOGRAM;  Surgeon: Conrad [place], MD;  Location: [place];  Service: Vascular;  Laterality: Left;  . LOWER EXTREMITY ANGIOGRAM Bilateral 03/09/2013   Procedure: LOWER EXTREMITY ANGIOGRAM;  Surgeon: Elam Dutch, MD;  Location: [facility] CATH LAB;  Service: Cardiovascular;  Laterality: Bilateral;  . LOWER EXTREMITY ANGIOGRAM Right 10/25/2013   Procedure: LOWER EXTREMITY ANGIOGRAM;  Surgeon: Conrad Hissop, MD;  Location: [facility] CATH LAB;  Service: Cardiovascular;  Laterality: Right;  . LOWER EXTREMITY ANGIOGRAM Right 12/13/2013   Procedure: LOWER EXTREMITY ANGIOGRAM;  Surgeon: Conrad [place], MD;  Location: [facility] CATH LAB;  Service: Cardiovascular;  Laterality: Right;  . NEPHRECTOMY RECIPIENT  2010  . PATCH ANGIOPLASTY Right 04/17/2015   Procedure: Rueben Bash PATCH ANGIOPLASTY, RIGHT ILEOFEMORAL;  Surgeon: Conrad [place], MD;  Location: [place];  Service: Vascular;  Laterality: Right;  . PERIPHERAL VASCULAR CATHETERIZATION N/A 04/10/2015   Procedure: Abdominal Aortogram;  Surgeon: Conrad [place], MD;  Location: [place] CV LAB;  Service: Cardiovascular;  Laterality: N/A;  . s/p cadaveric renal transplant  December 2010   [place] Left 09/24/2015   Procedure: EXCISION OF THROMBOSED RADIOCEPHALIC ARTERIOVENOUS FISTULA;  Surgeon: Conrad [place], MD;  Location: [place];  Service: Vascular;  Laterality: Left;  Marland Kitchen VEIN HARVEST Right 04/17/2015   Procedure: RIGHT GREATER SAPPHENOUS VEIN HARVEST ;  Surgeon: Conrad Manti, MD;  Location: [place];  Service: Vascular;  Laterality: Right;     Social History:  The patient  reports that he quit smoking about 13 years ago. His smoking use included E-cigarettes. He has a 17.00 pack-year smoking history. He uses smokeless tobacco. He reports that he uses drugs, including Cocaine, Marijuana, and Heroin. He reports that he does not drink alcohol.   Family History:  The patient's family history includes Hypertension in  his father and mother; Other in his father.    ROS:  Please see the history of present illness. All other systems are reviewed and  Negative to the above problem except as noted.    PHYSICAL EXAM: VS:  BP 118/76   Pulse 79   Ht 5\' 10"  (1.778 m)   Wt 146 lb 1.9 oz (66.3 kg)   BMI 20.97 kg/m   GEN: Well nourished, well developed, in no acute distress HEENT: normal Neck: no JVD, carotid bruits, or masses Cardiac: RRR; no murmurs, rubs, or gallops,Tr  edema   Wearing support socks   Respiratory:  clear to auscultation bilaterally, normal work  of breathing GI: soft, nontender, nondistended, + BS  No hepatomegaly  MS: no deformity Moving all extremities   Skin: warm and dry, no rash Neuro:  Strength and sensation are intact Psych: euthymic mood, full affect   EKG:  EKG is ordered today.  SR 79  rSR'  LVH  LAA,  RAA    Lipid Panel    Component Value Date/Time   CHOL 192 11/01/2014 0926   TRIG 135.0 11/01/2014 0926   HDL 58.40 11/01/2014 0926   CHOLHDL 3 11/01/2014 0926   VLDL 27.0 11/01/2014 0926   LDLCALC 107 (H) 11/01/2014 0926      Wt Readings from Last 3 Encounters:  02/16/16 146 lb 1.9 oz (66.3 kg)  01/23/16 144 lb 8 oz (65.5 kg)  12/18/15 146 lb (66.2 kg)      ASSESSMENT AND PLAN:  1  HL  WIll check lipdis   2  HTN Good control  3  CKD   S/p Tx  Followd By Massie Kluver  4  PVOD  Follows with Dr Bridgett Larsson  WIll check lipids    F/U in 1 year     Current medicines are reviewed at length with the patient today.  The patient does not have concerns regarding medicines.  Signed, Dorris Carnes, MD  02/16/2016 3:00 PM    [facility], [place], [place]  [address] Phone: [phone]; Fax: [phone]

## 2016-02-16 NOTE — Patient Instructions (Signed)
Your physician recommends that you continue on your current medications as directed. Please refer to the Current Medication list given to you today. Your physician recommends that you return for lab work ([place]) Your physician wants you to follow-up in: 1 year with Dr. Harrington Challenger.  You will receive a reminder letter in the mail two months in advance. If you don't receive a letter, please call our office to schedule the follow-up appointment.

## 2016-02-17 LAB — LIPID PANEL
CHOL/HDL RATIO: 2.2 ratio (ref <=5.0)
Cholesterol: 174 mg/dL (ref 125–200)
HDL: 80 mg/dL (ref >=40)
LDL CALC: 83 mg/dL (ref <130)
Triglycerides: 55 mg/dL (ref <150)
VLDL: 11 mg/dL (ref <30)

## 2016-02-29 NOTE — Progress Notes (Deleted)
[name] [address], [place] [address] Phone: [phone] Subjective:    I'm seeing this patient by the request  of:    CC: Neck pain   [id number]  Scott Chavez is a 60 y.o. male coming in with complaint of neck pain. Past medical history significant for multiple comorbidities. Patient states     Past Medical History:  Diagnosis Date  . Anemia    pt. not sure, but reports he is taking Fe for one month now.  . Aorto-iliac disease ([place])   . Bladder dysfunction   . ESRD (end stage renal disease) [facility])    [facility] , followed by Dr. Moshe Cipro   . GERD (gastroesophageal reflux disease)   . Hemodialysis patient [facility])    "on dialysis 1998-2010" 04/25/2013)  . Hepatitis C   . History of renal failure   . Hypercholesteremia   . Hypertension   . Internal hemorrhoids    Colonoscopy 2010 and anoscopy 2015  . Peripheral vascular disease ([place])   . Pneumonia   . Self-catheterizes urinary bladder    pt. choice, but he reports that he desires to do this to avoid retention    Past Surgical History:  Procedure Laterality Date  . ABDOMINAL AORTAGRAM N/A 03/09/2013   Procedure: ABDOMINAL Maxcine Ham;  Surgeon: Elam Dutch, MD;  Location: [facility] CATH LAB;  Service: Cardiovascular;  Laterality: N/A;  . AV FISTULA PLACEMENT Left 1998   "removed 2011" (04/25/2013)  . AV FISTULA REPAIR Left    removal with partial vein removed  . COLONOSCOPY    . ENDARTERECTOMY FEMORAL Right 04/17/2015   Procedure: ENDARTERECTOMY RIGHT ILIOFEMORAL WITH PROFUNDOPLASTY;  Surgeon: Conrad [place], MD;  Location: [place];  Service: Vascular;  Laterality: Right;  . FEMORAL-POPLITEAL BYPASS GRAFT Left 04/24/2013   Procedure: BYPASS GRAFT COMMON FEMORALARTERY-BELOW KNEE POPLITEAL ARTERY WITH GREATER SAPHENOUS VEIN;  Surgeon: Conrad Nikiski, MD;  Location: [place];  Service: Vascular;  Laterality: Left;  . FEMORAL-POPLITEAL BYPASS GRAFT Right  04/17/2015   Procedure: BYPASS GRAFT RIGHT FEMORALTO BELOW KNEE POPLITEAL ARTERY USING RIGHT NON REVERSED GREATER SAPPHENOUS VEIN;  Surgeon: Conrad Salamanca, MD;  Location: [place];  Service: Vascular;  Laterality: Right;  . INTRAOPERATIVE ARTERIOGRAM Left 04/24/2013   Procedure: INTRA OPERATIVE ARTERIOGRAM;  Surgeon: Conrad Graford, MD;  Location: [place];  Service: Vascular;  Laterality: Left;  . LOWER EXTREMITY ANGIOGRAM Bilateral 03/09/2013   Procedure: LOWER EXTREMITY ANGIOGRAM;  Surgeon: Elam Dutch, MD;  Location: [facility] CATH LAB;  Service: Cardiovascular;  Laterality: Bilateral;  . LOWER EXTREMITY ANGIOGRAM Right 10/25/2013   Procedure: LOWER EXTREMITY ANGIOGRAM;  Surgeon: Conrad Sidney, MD;  Location: [facility] CATH LAB;  Service: Cardiovascular;  Laterality: Right;  . LOWER EXTREMITY ANGIOGRAM Right 12/13/2013   Procedure: LOWER EXTREMITY ANGIOGRAM;  Surgeon: Conrad Rozel, MD;  Location: [facility] CATH LAB;  Service: Cardiovascular;  Laterality: Right;  . NEPHRECTOMY RECIPIENT  2010  . PATCH ANGIOPLASTY Right 04/17/2015   Procedure: Rueben Bash PATCH ANGIOPLASTY, RIGHT ILEOFEMORAL;  Surgeon: Conrad [employer], MD;  Location: [place];  Service: Vascular;  Laterality: Right;  . PERIPHERAL VASCULAR CATHETERIZATION N/A 04/10/2015   Procedure: Abdominal Aortogram;  Surgeon: Conrad , MD;  Location: [place] CV LAB;  Service: Cardiovascular;  Laterality: N/A;  . s/p cadaveric renal transplant  December 2010   [place] Left 09/24/2015   Procedure: EXCISION OF THROMBOSED RADIOCEPHALIC ARTERIOVENOUS FISTULA;  Surgeon: Conrad [place], MD;  Location: [place];  Service: Vascular;  Laterality: Left;  Marland Kitchen VEIN HARVEST Right 04/17/2015   Procedure: RIGHT GREATER SAPPHENOUS VEIN HARVEST ;  Surgeon: Conrad [employer], MD;  Location: [place];  Service: Vascular;  Laterality: Right;   Social History   Social History  . Marital status: Divorced    Spouse name: N/A  . Number of children: 3  . Years of  education: N/A   Occupational History  . student    Social History Main Topics  . Smoking status: Former Smoker    Packs/day: 0.50    Years: 34.00    Types: E-cigarettes    Quit date: 02/17/2003  . Smokeless tobacco: Current User     Comment: vapor cigrarette  . Alcohol use No  . Drug use:     Types: Cocaine, Marijuana, Heroin     Comment: 04/25/2013 "abused in the past ; stopped  11/13/2002  . Sexual activity: Not on file   Other Topics Concern  . Not on file   Social History Narrative   Patient reports he is divorced. 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   On disability otherwise, he is a vapor smoker, 3 caffeinated beverages daily   Allergies  Allergen Reactions  . Tape Rash    Silk tape   Family History  Problem Relation Age of Onset  . Hypertension Father   . Other Father     amputation  . Hypertension Mother     Past medical history, social, surgical and family history all reviewed in electronic medical record.  No pertanent information unless stated regarding to the chief complaint.   Review of Systems: No headache, visual changes, nausea, vomiting, diarrhea, constipation, dizziness, abdominal pain, skin rash, fevers, chills, night sweats, weight loss, swollen lymph nodes, body aches, joint swelling, muscle aches, chest pain, shortness of breath, mood changes.   Objective  There were no vitals taken for this visit.  General: No apparent distress alert and oriented x3 mood and affect normal, dressed appropriately.  HEENT: Pupils equal, extraocular movements intact  Respiratory: Patient's speak in full sentences and does not appear short of breath  Cardiovascular: No lower extremity edema, non tender, no erythema  Skin: Warm dry intact with no signs of infection or rash on extremities or on axial skeleton.  Abdomen: Soft nontender  Neuro: Cranial nerves II through XII are intact, neurovascularly intact in all extremities with 2+ DTRs and 2+  pulses.  Lymph: No lymphadenopathy of posterior or anterior cervical chain or axillae bilaterally.  Gait normal with good balance and coordination.  MSK:  Non tender with full range of motion and good stability and symmetric strength and tone of shoulders, elbows, wrist, hip, knee and ankles bilaterally.  Neck: Inspection unremarkable. No palpable stepoffs. Negative Spurling's maneuver. Full neck range of motion Grip strength and sensation normal in bilateral hands Strength good C4 to T1 distribution No sensory change to C4 to T1 Negative Hoffman sign bilaterally Reflexes normal   Impression and Recommendations:     This case required medical decision making of moderate complexity.      Note: This dictation was prepared with Dragon dictation along with smaller phrase technology. Any transcriptional errors that result from this process are unintentional.

## 2016-03-01 ENCOUNTER — Ambulatory Visit: Payer: Medicare Other | Admitting: Family Medicine

## 2016-03-13 NOTE — Progress Notes (Signed)
[facility] [address], [place] [address] Phone: [phone] Subjective:    I'm seeing this patient by the request  of:  Dr. Harrington Challenger   CC: Neck pain   [name]  Scott Chavez is a 60 y.o. male coming in with complaint of neck pain. Past medical history significant for multiple comorbidities. Patient states His been going on for months. Does not rib or any true injury. States that he went in trying to crack his own neck and sensation is having of right-sided pain. Seems to stay localized in the neck, denies any radiation down the arm. Rates the severity pain as 5 out of 10 but is finding annoying Scott Chavez has more of a constant irritation in this area. Has tried other modalities including chiropractic with no significant improvement. Patient is studying a significant more than carrying a book that things may be causing some of the discomfort.  Also having some low back pain. Has had this for years. States that his been more intermittent. No radiation down the legs any numbness or tingling. Rates the severity of pain as 5 out of 10. atient's has been trying different things over the course of time but has not had any significant improvement. Does not think it is associated to his kidneys were his past medical history.     Past Medical History:  Diagnosis Date  . Anemia    pt. not sure, but reports he is taking Fe for one month now.  . Aorto-iliac disease ([place])   . Bladder dysfunction   . ESRD (end stage renal disease) [facility])    [facility]- [facility] , followed by Dr. Moshe Cipro   . GERD (gastroesophageal reflux disease)   . Hemodialysis patient [facility])    "on dialysis 1998-2010" 04/25/2013)  . Hepatitis C   . History of renal failure   . Hypercholesteremia   . Hypertension   . Internal hemorrhoids    Colonoscopy 2010 and anoscopy 2015  . Peripheral vascular disease ([place])   . Pneumonia   . Self-catheterizes urinary bladder    pt. choice, but he reports that he desires to do this to avoid retention    Past Surgical History:  Procedure Laterality Date  . ABDOMINAL AORTAGRAM N/A 03/09/2013   Procedure: ABDOMINAL Maxcine Ham;  Surgeon: Elam Dutch, MD;  Location: [facility] CATH LAB;  Service: Cardiovascular;  Laterality: N/A;  . AV FISTULA PLACEMENT Left 1998   "removed 2011" (04/25/2013)  . AV FISTULA REPAIR Left    removal with partial vein removed  . COLONOSCOPY    . ENDARTERECTOMY FEMORAL Right 04/17/2015   Procedure: ENDARTERECTOMY RIGHT ILIOFEMORAL WITH PROFUNDOPLASTY;  Surgeon: Conrad Bristow, MD;  Location: [place];  Service: Vascular;  Laterality: Right;  . FEMORAL-POPLITEAL BYPASS GRAFT Left 04/24/2013   Procedure: BYPASS GRAFT COMMON FEMORALARTERY-BELOW KNEE POPLITEAL ARTERY WITH GREATER SAPHENOUS VEIN;  Surgeon: Conrad Vacaville, MD;  Location: [place];  Service: Vascular;  Laterality: Left;  . FEMORAL-POPLITEAL BYPASS GRAFT Right 04/17/2015   Procedure: BYPASS GRAFT RIGHT FEMORALTO BELOW KNEE POPLITEAL ARTERY USING RIGHT NON REVERSED GREATER SAPPHENOUS VEIN;  Surgeon: Conrad Bishop, MD;  Location: [place];  Service: Vascular;  Laterality: Right;  . INTRAOPERATIVE ARTERIOGRAM Left 04/24/2013   Procedure: INTRA OPERATIVE ARTERIOGRAM;  Surgeon: Conrad Byron, MD;  Location: [place];  Service: Vascular;  Laterality: Left;  . LOWER EXTREMITY ANGIOGRAM Bilateral 03/09/2013   Procedure: LOWER EXTREMITY ANGIOGRAM;  Surgeon: Elam Dutch, MD;  Location: [place] CATH LAB;  Service: Cardiovascular;  Laterality: Bilateral;  . LOWER EXTREMITY ANGIOGRAM Right 10/25/2013   Procedure: LOWER EXTREMITY ANGIOGRAM;  Surgeon: Conrad Ryder, MD;  Location: [facility] CATH LAB;  Service: Cardiovascular;  Laterality: Right;  . LOWER EXTREMITY ANGIOGRAM Right 12/13/2013   Procedure: LOWER EXTREMITY ANGIOGRAM;  Surgeon: Conrad Greenbush, MD;  Location: [facility] CATH LAB;  Service: Cardiovascular;  Laterality: Right;  . NEPHRECTOMY RECIPIENT  2010  . PATCH ANGIOPLASTY  Right 04/17/2015   Procedure: Rueben Bash PATCH ANGIOPLASTY, RIGHT ILEOFEMORAL;  Surgeon: Conrad [place] River, MD;  Location: [place];  Service: Vascular;  Laterality: Right;  . PERIPHERAL VASCULAR CATHETERIZATION N/A 04/10/2015   Procedure: Abdominal Aortogram;  Surgeon: Conrad Clay, MD;  Location: [place] CV LAB;  Service: Cardiovascular;  Laterality: N/A;  . s/p cadaveric renal transplant  December 2010   [place] Left 09/24/2015   Procedure: EXCISION OF THROMBOSED RADIOCEPHALIC ARTERIOVENOUS FISTULA;  Surgeon: Conrad [place], MD;  Location: [place];  Service: Vascular;  Laterality: Left;  Marland Kitchen VEIN HARVEST Right 04/17/2015   Procedure: RIGHT GREATER SAPPHENOUS VEIN HARVEST ;  Surgeon: Conrad Oak Hill, MD;  Location: [place];  Service: Vascular;  Laterality: Right;   Social History   Social History  . Marital status: Divorced    Spouse name: N/A  . Number of children: 3  . Years of education: N/A   Occupational History  . student    Social History Main Topics  . Smoking status: Former Smoker    Packs/day: 0.50    Years: 34.00    Types: E-cigarettes    Quit date: 02/17/2003  . Smokeless tobacco: Current User     Comment: vapor cigrarette  . Alcohol use No  . Drug use:     Types: Cocaine, Marijuana, Heroin     Comment: 04/25/2013 "abused in the past ; stopped  11/13/2002  . Sexual activity: Not Asked   Other Topics Concern  . None   Social History Narrative   Patient reports he is divorced. 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   On disability otherwise, he is a vapor smoker, 3 caffeinated beverages daily   Allergies  Allergen Reactions  . Tape Rash    Silk tape   Family History  Problem Relation Age of Onset  . Hypertension Father   . Other Father     amputation  . Hypertension Mother     Past medical history, social, surgical and family history all reviewed in electronic medical record.  No pertanent information unless stated  regarding to the chief complaint.   Review of Systems: No headache, visual changes, nausea, vomiting, diarrhea, constipation, dizziness, abdominal pain, skin rash, fevers, chills, night sweats, weight loss, swollen lymph nodes, body aches, joint swelling, muscle aches, chest pain, shortness of breath, mood changes.   Objective  Blood pressure 118/68, pulse 96, height 5\' 10"  (1.778 m), weight 146 lb (66.2 kg), SpO2 98 %.  General: No apparent distress alert and oriented x3 mood and affect normal, dressed appropriately.  HEENT: Pupils equal, extraocular movements intact  Respiratory: Patient's speak in full sentences and does not appear short of breath  Cardiovascular: No lower extremity edema, non tender, no erythema  Skin: Warm dry intact with no signs of infection or rash on extremities or on axial skeleton.  Abdomen: Soft nontender  Neuro: Cranial nerves II through XII are intact, neurovascularly intact in all extremities  with 2+ DTRs and 2+ pulses.  Lymph: No lymphadenopathy of posterior or anterior cervical chain or axillae bilaterally.  Gait normal with good balance and coordination.  MSK:  Non tender with full range of motion and good stability and symmetric strength and tone of shoulders, elbows, wrist, hip, knee and ankles bilaterally.  Neck: Inspection unremarkable. No palpable stepoffs. Negative Spurling's maneuver. Full neck range of motion tightness on the right side of the base of the skull down to the shoulder. Grip strength and sensation normal in bilateral hands Strength good C4 to T1 distribution No sensory change to C4 to T1 Negative Hoffman sign bilaterally Reflexes normal  Back Exam:  Inspection: Unremarkable  Motion: Flexion 45 deg, Extension 45 deg, Side Bending to 45 deg bilaterally,  Rotation to 45 deg bilaterally  SLR laying: Negative  XSLR laying: Negative  Palpable tenderness: Mild tenderness to palpation in the paraspinal musculature of the lumbar spine  L4-S1 on the right. FABER: Mild tenderness of the right side. Sensory change: Gross sensation intact to all lumbar and sacral dermatomes.  Reflexes: 2+ at both patellar tendons, 2+ at achilles tendons, Babinski's downgoing.  Strength at foot  Plantar-flexion: 5/5 Dorsi-flexion: 5/5 Eversion: 5/5 Inversion: 5/5  Leg strength  Quad: 5/5 Hamstring: 5/5 Hip flexor: 5/5 Hip abductors: 4/5 but symmetric Gait unremarkable.  Osteopathic findings C2 flexed rotated and side bent right C6 flexed rotated and side bent left  T3 extended rotated and side bent right T7 extended rotated and side bent left  L2 flexed rotated and side bent right Sacrum right on right   procedure note 97110; 15 minutes spent for Therapeutic exercises as stated in above notes.  This included exercises focusing on stretching, strengthening, with significant focus on eccentric aspects.  Low back exercises that included:  Pelvic tilt/bracing instruction to focus on control of the pelvic girdle and lower abdominal muscles  Glute strengthening exercises, focusing on proper firing of the glutes without engaging the low back muscles Proper stretching techniques for maximum relief for the hamstrings, hip flexors, low back and some rotation where tolerated   Proper technique shown and discussed handout in great detail with ATC.  All questions were discussed and answered.   Impression and Recommendations:     This case required medical decision making of moderate complexity.      Note: This dictation was prepared with Dragon dictation along with smaller phrase technology. Any transcriptional errors that result from this process are unintentional.

## 2016-03-15 ENCOUNTER — Ambulatory Visit (INDEPENDENT_AMBULATORY_CARE_PROVIDER_SITE_OTHER)
Admission: RE | Admit: 2016-03-15 | Discharge: 2016-03-15 | Disposition: A | Discharge status: Home / Self Care | Payer: Medicare Other | Source: Ambulatory Visit | Attending: Family Medicine | Admitting: Family Medicine

## 2016-03-15 ENCOUNTER — Ambulatory Visit (INDEPENDENT_AMBULATORY_CARE_PROVIDER_SITE_OTHER): Payer: Medicare Other | Admitting: Family Medicine

## 2016-03-15 ENCOUNTER — Encounter: Payer: Self-pay | Admitting: Family Medicine

## 2016-03-15 VITALS — BP 118/68 | HR 96 | Ht 70.0 in | Wt 146.0 lb

## 2016-03-15 DIAGNOSIS — M5416 Radiculopathy, lumbar region: Secondary | ICD-10-CM

## 2016-03-15 DIAGNOSIS — G8929 Other chronic pain: Secondary | ICD-10-CM | POA: Insufficient documentation

## 2016-03-15 DIAGNOSIS — M9984 Other biomechanical lesions of sacral region: Secondary | ICD-10-CM

## 2016-03-15 DIAGNOSIS — M5010 Cervical disc disorder with radiculopathy, unspecified cervical region: Secondary | ICD-10-CM | POA: Diagnosis not present

## 2016-03-15 DIAGNOSIS — M545 Low back pain: Secondary | ICD-10-CM | POA: Diagnosis not present

## 2016-03-15 DIAGNOSIS — M999 Biomechanical lesion, unspecified: Secondary | ICD-10-CM | POA: Insufficient documentation

## 2016-03-15 DIAGNOSIS — M542 Cervicalgia: Secondary | ICD-10-CM | POA: Diagnosis not present

## 2016-03-15 MED ORDER — VITAMIN D (ERGOCALCIFEROL) 1.25 MG (50000 UNIT) PO CAPS: 50000.0000 [IU] | ORAL_CAPSULE | ORAL | 0 refills | Status: DC

## 2016-03-15 NOTE — Assessment & Plan Note (Signed)
Decision today to treat with OMT was based on Physical Exam  After verbal consent patient was treated with , ME techniques in cervical, thoracic lumbar and sacral  areas  Patient tolerated the procedure well with improvement in symptoms  Patient given exercises, stretches and lifestyle modifications  See medications in patient instructions if given  Patient will follow up in 3-4 weeks

## 2016-03-15 NOTE — Assessment & Plan Note (Signed)
Patient is a greater range of motion. I do believe that a lot of this is secondary to ergonomics and in proper lifting mechanics. X-rays ordered today for further evaluation with patient having history of significant peripheral artery disease. We will see if there is any calcification plan overall. We also discussed icing regimen, home exercises, patient work on Engineer, building services. Work with Product/process development scientist. Responded well to osteopathic manipulation. Follow-up again in 4 weeks.

## 2016-03-15 NOTE — Patient Instructions (Signed)
Good to see you  Ice is your friend Ice 20 minutes 2 times daily. Usually after activity and before bed. Exercises 3 times a week.  On wall with heels, butt shoulder and head touching for a goal of 5 minutes daily  Tennis ball between shoulder blades with sitting.  Once weekly vitamin D for next 12 weeks.  Tart cherry extract any dose at night Xrays downstairs See me again in 4 weeks.

## 2016-03-15 NOTE — Assessment & Plan Note (Signed)
No significant findings of arthritis with good range of motion. Patient did very well to osteopathic manipulation. Discussed core strengthening and hip abductor strengthening. Discussed changing position, we discussed ergonomics with him studying more now that he is back in school. Patient will come back and see me again in 4 weeks.

## 2016-04-11 NOTE — Progress Notes (Signed)
[name] [address], [place] [address] Phone: [phone] Subjective:      CC: Neck pain f/u, Low back pain follow-up  [id number]  Scott Chavez is a 60 y.o. male coming in with complaint of neck pain. Past medical history significant for multiple comorbidities.  Patient was found to have poor posture, given ergonomic adjustments, patient responded fairly well to osteopathic manipulation. Patient states did have improvement at first then worsening. Now back to baseline. Patient states still seems to be the neck in the lower back. No radiation to any of the extremities. States that it just seems to be severe overall. Does not affect daily activities and he can push through it but unfortunately makes it more difficult. Can wake him up at night.    patient had x-rays at last exam. X-rays the patient's lumbar spine were independently visualized by me showing no significant bony abnormality patient's cervical spine showing moderate degenerative disc disease at C5-C6. Both x-rays that show significant calcific changes of the blood vessels.  Past Medical History:  Diagnosis Date  . Anemia    pt. not sure, but reports he is taking Fe for one month now.  . Aorto-iliac disease ([place])   . Bladder dysfunction   . ESRD (end stage renal disease) [facility])    [facility]plant- [facility] , followed by Dr. Moshe Cipro   . GERD (gastroesophageal reflux disease)   . Hemodialysis patient [facility])    "on dialysis 1998-2010" 04/25/2013)  . Hepatitis C   . History of renal failure   . Hypercholesteremia   . Hypertension   . Internal hemorrhoids    Colonoscopy 2010 and anoscopy 2015  . Peripheral vascular disease ([place])   . Pneumonia   . Self-catheterizes urinary bladder    pt. choice, but he reports that he desires to do this to avoid retention    Past Surgical History:  Procedure Laterality Date  . ABDOMINAL AORTAGRAM N/A 03/09/2013   Procedure:  ABDOMINAL Maxcine Ham;  Surgeon: Elam Dutch, MD;  Location: [facility] CATH LAB;  Service: Cardiovascular;  Laterality: N/A;  . AV FISTULA PLACEMENT Left 1998   "removed 2011" (04/25/2013)  . AV FISTULA REPAIR Left    removal with partial vein removed  . COLONOSCOPY    . ENDARTERECTOMY FEMORAL Right 04/17/2015   Procedure: ENDARTERECTOMY RIGHT ILIOFEMORAL WITH PROFUNDOPLASTY;  Surgeon: Conrad Sandstone, MD;  Location: [place];  Service: Vascular;  Laterality: Right;  . FEMORAL-POPLITEAL BYPASS GRAFT Left 04/24/2013   Procedure: BYPASS GRAFT COMMON FEMORALARTERY-BELOW KNEE POPLITEAL ARTERY WITH GREATER SAPHENOUS VEIN;  Surgeon: Conrad [place], MD;  Location: [place];  Service: Vascular;  Laterality: Left;  . FEMORAL-POPLITEAL BYPASS GRAFT Right 04/17/2015   Procedure: BYPASS GRAFT RIGHT FEMORALTO BELOW KNEE POPLITEAL ARTERY USING RIGHT NON REVERSED GREATER SAPPHENOUS VEIN;  Surgeon: Conrad Casselberry, MD;  Location: [place];  Service: Vascular;  Laterality: Right;  . INTRAOPERATIVE ARTERIOGRAM Left 04/24/2013   Procedure: INTRA OPERATIVE ARTERIOGRAM;  Surgeon: Conrad [place], MD;  Location: [place];  Service: Vascular;  Laterality: Left;  . LOWER EXTREMITY ANGIOGRAM Bilateral 03/09/2013   Procedure: LOWER EXTREMITY ANGIOGRAM;  Surgeon: Elam Dutch, MD;  Location: [facility] CATH LAB;  Service: Cardiovascular;  Laterality: Bilateral;  . LOWER EXTREMITY ANGIOGRAM Right 10/25/2013   Procedure: LOWER EXTREMITY ANGIOGRAM;  Surgeon: Conrad , MD;  Location: [facility] CATH LAB;  Service: Cardiovascular;  Laterality: Right;  . LOWER EXTREMITY ANGIOGRAM Right 12/13/2013   Procedure:  LOWER EXTREMITY ANGIOGRAM;  Surgeon: Conrad Davidson, MD;  Location: [facility] CATH LAB;  Service: Cardiovascular;  Laterality: Right;  . NEPHRECTOMY RECIPIENT  2010  . PATCH ANGIOPLASTY Right 04/17/2015   Procedure: Rueben Bash PATCH ANGIOPLASTY, RIGHT ILEOFEMORAL;  Surgeon: Conrad Henderson, MD;  Location: [place];  Service: Vascular;  Laterality: Right;  . PERIPHERAL  VASCULAR CATHETERIZATION N/A 04/10/2015   Procedure: Abdominal Aortogram;  Surgeon: Conrad George Mason, MD;  Location: [place] CV LAB;  Service: Cardiovascular;  Laterality: N/A;  . s/p cadaveric renal transplant  December 2010   [place] Left 09/24/2015   Procedure: EXCISION OF THROMBOSED RADIOCEPHALIC ARTERIOVENOUS FISTULA;  Surgeon: Conrad C[place]ama, MD;  Location: [place];  Service: Vascular;  Laterality: Left;  Marland Kitchen VEIN HARVEST Right 04/17/2015   Procedure: RIGHT GREATER SAPPHENOUS VEIN HARVEST ;  Surgeon: Conrad [place], MD;  Location: [place];  Service: Vascular;  Laterality: Right;   Social History   Social History  . Marital status: Divorced    Spouse name: N/A  . Number of children: 3  . Years of education: N/A   Occupational History  . student    Social History Main Topics  . Smoking status: Former Smoker    Packs/day: 0.50    Years: 34.00    Types: E-cigarettes    Quit date: 02/17/2003  . Smokeless tobacco: Current User     Comment: vapor cigrarette  . Alcohol use No  . Drug use:     Types: Cocaine, Marijuana, Heroin     Comment: 04/25/2013 "abused in the past ; stopped  11/13/2002  . Sexual activity: Not Asked   Other Topics Concern  . None   Social History Narrative   Patient reports he is divorced. 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   On disability otherwise, he is a vapor smoker, 3 caffeinated beverages daily   Allergies  Allergen Reactions  . Tape Rash    Silk tape   Family History  Problem Relation Age of Onset  . Hypertension Father   . Other Father     amputation  . Hypertension Mother     Past medical history, social, surgical and family history all reviewed in electronic medical record.  No pertanent information unless stated regarding to the chief complaint.   Review of Systems: No , visual changes, nausea, vomiting, diarrhea, constipation, dizziness, abdominal pain, skin rash, fevers, chills, night  sweats, weight loss, swollen lymph nodes, b, chest pain, shortness of breath, mood changes.     Objective  Blood pressure (!) 142/78, pulse 77, height 5\' 10"  (1.778 m), weight 148 lb (67.1 kg), SpO2 97 %.  Systems examined below as of 04/12/16 General: NAD A&O x3 mood, affect normal  HEENT: Pupils equal, extraocular movements intact no nystagmus Respiratory: not short of breath at rest or with speaking Cardiovascular: No lower extremity edema, non tender Skin: Warm dry intact with no signs of infection or rash on extremities or on axial skeleton. Abdomen: Soft nontender, no masses Neuro: Cranial nerves  intact, neurovascularly intact in all extremities with 2+ DTRs and 2+ pulses. Lymph: No lymphadenopathy appreciated today  Gait normal with good balance and coordination.  MSK: Non tender with full range of motion and good stability and symmetric strength and tone of shoulders, elbows, wrist,  knee hips and ankles bilaterally.  Mild arthritic changes.  Neck: Inspection unremarkable. No palpable stepoffs. Negative Spurling's maneuver. Continued tightness  at the base of the neck.  Grip strength and sensation normal in bilateral hands Strength good C4 to T1 distribution No sensory change to C4 to T1 Negative Hoffman sign bilaterally Reflexes normal  Back Exam:  Inspection: Unremarkable  Motion: Flexion 45 deg, Extension 30 deg, Side Bending to 35 deg bilaterally,  Rotation to 35 deg bilaterally  SLR laying: Negative  XSLR laying: Negative  Palpable tenderness: Continued tightness and tenderness over the paraspinal musculature of the lumbar spine bilateral FABER: Mild tenderness of the right side. Sensory change: Gross sensation intact to all lumbar and sacral dermatomes.  Reflexes: 2+ at both patellar tendons, 2+ at achilles tendons, Babinski's downgoing.  Strength at foot  Plantar-flexion: 5/5 Dorsi-flexion: 5/5 Eversion: 5/5 Inversion: 5/5  Leg strength  Quad: 5/5 Hamstring: 5/5  Hip flexor: 5/5 Hip abductors: 4/5 but symmetric Gait unremarkable.  Osteopathic findings C2 flexed rotated and side bent right C5 flexed rotated and side bent left  T3 extended rotated and side bent right T8 extended rotated and side bent left  L2 flexed rotated and side bent right Sacrum right on right    Impression and Recommendations:     This case required medical decision making of moderate complexity.      Note: This dictation was prepared with Dragon dictation along with smaller phrase technology. Any transcriptional errors that result from this process are unintentional.

## 2016-04-12 ENCOUNTER — Ambulatory Visit (INDEPENDENT_AMBULATORY_CARE_PROVIDER_SITE_OTHER): Payer: Medicare Other | Admitting: Family Medicine

## 2016-04-12 ENCOUNTER — Encounter: Payer: Self-pay | Admitting: Family Medicine

## 2016-04-12 VITALS — BP 142/78 | HR 77 | Ht 70.0 in | Wt 148.0 lb

## 2016-04-12 DIAGNOSIS — G8929 Other chronic pain: Secondary | ICD-10-CM

## 2016-04-12 DIAGNOSIS — M545 Low back pain: Secondary | ICD-10-CM

## 2016-04-12 DIAGNOSIS — M9982 Other biomechanical lesions of thoracic region: Secondary | ICD-10-CM | POA: Diagnosis not present

## 2016-04-12 DIAGNOSIS — M999 Biomechanical lesion, unspecified: Secondary | ICD-10-CM

## 2016-04-12 DIAGNOSIS — M542 Cervicalgia: Secondary | ICD-10-CM | POA: Diagnosis not present

## 2016-04-12 MED ORDER — GABAPENTIN 100 MG PO CAPS: 200.0000 mg | ORAL_CAPSULE | Freq: Every day | ORAL | 3 refills | Status: DC

## 2016-04-12 NOTE — Assessment & Plan Note (Signed)
Stable, discussed posture, ergonmics and lifting mechanics.  RTC in 4 weeks responded well to OMT

## 2016-04-12 NOTE — Assessment & Plan Note (Signed)
Decision today to treat with OMT was based on Physical Exam  After verbal consent patient was treated with , ME techniques in cervical, thoracic lumbar and sacral  areas  Patient tolerated the procedure well with improvement in symptoms  Patient given exercises, stretches and lifestyle modifications  See medications in patient instructions if given  Patient will follow up in 4-6 weeks

## 2016-04-12 NOTE — Assessment & Plan Note (Signed)
Stable overall. Started on gabapentin. Ome exercise. Respond well to OMT. Patient needs to put on some muscle. Follow-up again in 4-6 weeks

## 2016-04-12 NOTE — Patient Instructions (Addendum)
Good to see you  Gabapentin 200mg  at night 2 tennis ball in a tube sock and lay on them where head meets neck. On wall with heels, butt shoulder and head touching for a goal of 5 minutes daily  Stay active OK to start cardio 3 times a week Then in 2 weeks start some machine weights very low Gabapentin 200mg  at night See me again in 4 weeks.

## 2016-04-19 ENCOUNTER — Other Ambulatory Visit: Payer: Self-pay | Admitting: Internal Medicine

## 2016-04-19 ENCOUNTER — Other Ambulatory Visit: Payer: Medicare Other

## 2016-04-19 DIAGNOSIS — B182 Chronic viral hepatitis C: Secondary | ICD-10-CM

## 2016-04-19 DIAGNOSIS — D509 Iron deficiency anemia, unspecified: Secondary | ICD-10-CM

## 2016-04-19 DIAGNOSIS — Z8619 Personal history of other infectious and parasitic diseases: Secondary | ICD-10-CM

## 2016-04-19 LAB — IRON: Iron: 48 ug/dL — ABNORMAL LOW (ref 50–180)

## 2016-04-19 LAB — HEPATITIS B CORE ANTIBODY, TOTAL: Hep B Core Total Ab: REACTIVE — AB

## 2016-04-20 ENCOUNTER — Other Ambulatory Visit: Payer: Medicare Other

## 2016-04-20 LAB — HIV ANTIBODY (ROUTINE TESTING W REFLEX): HIV 1&2 Ab, 4th Generation: NONREACTIVE

## 2016-04-20 LAB — ANA: ANA: NEGATIVE

## 2016-04-20 LAB — HEPATITIS B SURFACE ANTIBODY,QUALITATIVE: Hep B S Ab: POSITIVE — AB

## 2016-04-20 LAB — HEPATITIS B SURFACE ANTIGEN: Hepatitis B Surface Ag: NEGATIVE

## 2016-04-20 LAB — HEPATITIS A ANTIBODY, TOTAL: HEP A TOTAL AB: REACTIVE — AB

## 2016-04-22 LAB — HCV RNA, QN PCR RFLX GENO, LIPA: HCV RNA, PCR, QN: <15 IU/mL (ref <15)

## 2016-04-23 ENCOUNTER — Encounter: Payer: Self-pay | Admitting: Vascular Surgery

## 2016-04-30 ENCOUNTER — Ambulatory Visit (INDEPENDENT_AMBULATORY_CARE_PROVIDER_SITE_OTHER)
Admission: RE | Admit: 2016-04-30 | Discharge: 2016-04-30 | Disposition: A | Discharge status: Home / Self Care | Payer: Medicare Other | Source: Ambulatory Visit | Attending: Vascular Surgery | Admitting: Vascular Surgery

## 2016-04-30 ENCOUNTER — Ambulatory Visit (HOSPITAL_COMMUNITY)
Admission: RE | Admit: 2016-04-30 | Discharge: 2016-04-30 | Disposition: A | Discharge status: Home / Self Care | Payer: Medicare Other | Source: Ambulatory Visit | Attending: Vascular Surgery | Admitting: Vascular Surgery

## 2016-04-30 ENCOUNTER — Ambulatory Visit: Payer: Medicare Other | Admitting: Vascular Surgery

## 2016-04-30 DIAGNOSIS — I745 Embolism and thrombosis of iliac artery: Secondary | ICD-10-CM | POA: Insufficient documentation

## 2016-04-30 DIAGNOSIS — I70211 Atherosclerosis of native arteries of extremities with intermittent claudication, right leg: Secondary | ICD-10-CM | POA: Insufficient documentation

## 2016-04-30 DIAGNOSIS — R9439 Abnormal result of other cardiovascular function study: Secondary | ICD-10-CM | POA: Insufficient documentation

## 2016-04-30 DIAGNOSIS — Z95820 Peripheral vascular angioplasty status with implants and grafts: Secondary | ICD-10-CM | POA: Insufficient documentation

## 2016-05-04 ENCOUNTER — Ambulatory Visit (INDEPENDENT_AMBULATORY_CARE_PROVIDER_SITE_OTHER): Payer: Medicare Other | Admitting: Internal Medicine

## 2016-05-04 ENCOUNTER — Encounter: Payer: Self-pay | Admitting: Internal Medicine

## 2016-05-04 VITALS — BP 199/92 | HR 73 | Temp 97.9°F | Ht 70.0 in | Wt 150.0 lb

## 2016-05-04 DIAGNOSIS — K7400 Hepatic fibrosis, unspecified: Secondary | ICD-10-CM

## 2016-05-04 DIAGNOSIS — B182 Chronic viral hepatitis C: Secondary | ICD-10-CM | POA: Diagnosis not present

## 2016-05-04 NOTE — Assessment & Plan Note (Signed)
Doing well, no alcohol.  I discussed liver care otherwise.  rtc prn, no HCC screening indicated

## 2016-05-04 NOTE — Progress Notes (Signed)
   Subjective:    Patient ID: Scott Chavez, male    DOB: 10-19-1955, 61 y.o.   MRN: [id number]  HPI Here for follow up of HCV  Has genotype 1a, elastography with F2/3, hepatitis A and B immune.  Started on Zepatier and completed in August.  Early viral load down to just 20 copies and EOT undetectable in August.  No complaints. On tacrolimus as well for transplant.  Here for SVR.  No new issues.     Review of Systems  Constitutional: Negative for fatigue.  Skin: Negative for rash.  Neurological: Negative for dizziness and headaches.       Objective:   Physical Exam  Constitutional: He appears well-developed and well-nourished.  Eyes: No scleral icterus.  Cardiovascular: Normal rate, regular rhythm and normal heart sounds.   Skin: No rash noted.          Assessment & Plan:

## 2016-05-04 NOTE — Progress Notes (Signed)
Established Previous Bypass   History of Present Illness  Scott Chavez is a 61 y.o. (1955/07/25) male who presents with chief complaint: resolving neuropathic pain in R foot. Previous operation(s) include:   1. L iliofem EA w/ BPA, L CFA to BK pop BPG with NR ips GSV (04/24/13) 2. PTA+S R CIA and L CIA (10/25/13) 3. OA+PTA R F-P (12/13/13) (ERROR in op note in laterality) 4. R iliofem EA w/ BPA, extended R profundaplasty, R CFA to BK pop BPG w/ NR ips GSV (04/17/15) 5.  Excision of thrombosed L RC AVF  The patient's symptoms have improved. His intermittent claudicationsx are resolved.  His sx are some aching in his R pelvis. The patient's treatment regimen currently included: maximal medical management and walking.  Past Medical History:  Diagnosis Date  . Anemia    pt. not sure, but reports he is taking Fe for one month now.  . Aorto-iliac disease ([place])   . Bladder dysfunction   . ESRD (end stage renal disease) [facility])    transplant- 12/13/2010[facility] , followed by Dr. Moshe Cipro   . GERD (gastroesophageal reflux disease)   . Hemodialysis patient [facility])    "on dialysis 1998-2010" 04/25/2013)  . Hepatitis C   . History of renal failure   . Hypercholesteremia   . Hypertension   . Internal hemorrhoids    Colonoscopy 2010 and anoscopy 2015  . Peripheral vascular disease ([place])   . Pneumonia   . Self-catheterizes urinary bladder    pt. choice, but he reports that he desires to do this to avoid retention     Past Surgical History:  Procedure Laterality Date  . ABDOMINAL AORTAGRAM N/A 03/09/2013   Procedure: ABDOMINAL Maxcine Ham;  Surgeon: Elam Dutch, MD;  Location: [facility] CATH LAB;  Service: Cardiovascular;  Laterality: N/A;  . AV FISTULA PLACEMENT Left 1998   "removed 2011" (04/25/2013)  . AV FISTULA REPAIR Left    removal with partial vein removed  . COLONOSCOPY    . ENDARTERECTOMY FEMORAL Right 04/17/2015   Procedure: ENDARTERECTOMY RIGHT  ILIOFEMORAL WITH PROFUNDOPLASTY;  Surgeon: Conrad Shady Grove, MD;  Location: [place];  Service: Vascular;  Laterality: Right;  . FEMORAL-POPLITEAL BYPASS GRAFT Left 04/24/2013   Procedure: BYPASS GRAFT COMMON FEMORALARTERY-BELOW KNEE POPLITEAL ARTERY WITH GREATER SAPHENOUS VEIN;  Surgeon: Conrad Coffee, MD;  Location: [place];  Service: Vascular;  Laterality: Left;  . FEMORAL-POPLITEAL BYPASS GRAFT Right 04/17/2015   Procedure: BYPASS GRAFT RIGHT FEMORALTO BELOW KNEE POPLITEAL ARTERY USING RIGHT NON REVERSED GREATER SAPPHENOUS VEIN;  Surgeon: Conrad Deerfield, MD;  Location: [place];  Service: Vascular;  Laterality: Right;  . INTRAOPERATIVE ARTERIOGRAM Left 04/24/2013   Procedure: INTRA OPERATIVE ARTERIOGRAM;  Surgeon: Conrad Bellevue, MD;  Location: [place];  Service: Vascular;  Laterality: Left;  . LOWER EXTREMITY ANGIOGRAM Bilateral 03/09/2013   Procedure: LOWER EXTREMITY ANGIOGRAM;  Surgeon: Elam Dutch, MD;  Location: [facility] CATH LAB;  Service: Cardiovascular;  Laterality: Bilateral;  . LOWER EXTREMITY ANGIOGRAM Right 10/25/2013   Procedure: LOWER EXTREMITY ANGIOGRAM;  Surgeon: Conrad Dorneyville, MD;  Location: [facility] CATH LAB;  Service: Cardiovascular;  Laterality: Right;  . LOWER EXTREMITY ANGIOGRAM Right 12/13/2013   Procedure: LOWER EXTREMITY ANGIOGRAM;  Surgeon: Conrad Brookside Village, MD;  Location: [facility] CATH LAB;  Service: Cardiovascular;  Laterality: Right;  . NEPHRECTOMY RECIPIENT  2010  . PATCH ANGIOPLASTY Right 04/17/2015   Procedure: Rueben Bash PATCH ANGIOPLASTY, RIGHT ILEOFEMORAL;  Surgeon: Conrad Springville, MD;  [facility] OR;  Service: Vascular;  Laterality: Right;  . PERIPHERAL VASCULAR CATHETERIZATION N/A 04/10/2015   Procedure: Abdominal Aortogram;  Surgeon: Conrad Carrollton, MD;  Location: [place] CV LAB;  Service: Cardiovascular;  Laterality: N/A;  . s/p cadaveric renal transplant  December 2010   [place] Left 09/24/2015   Procedure: EXCISION OF THROMBOSED RADIOCEPHALIC ARTERIOVENOUS  FISTULA;  Surgeon: Conrad Ohlman, MD;  Location: [place];  Service: Vascular;  Laterality: Left;  Marland Kitchen VEIN HARVEST Right 04/17/2015   Procedure: RIGHT GREATER SAPPHENOUS VEIN HARVEST ;  Surgeon: Conrad , MD;  Location: [place];  Service: Vascular;  Laterality: Right;    Social History   Social History  . Marital status: Divorced    Spouse name: N/A  . Number of children: 3  . Years of education: N/A   Occupational History  . student    Social History Main Topics  . Smoking status: Former Smoker    Packs/day: 0.50    Years: 34.00    Types: E-cigarettes    Quit date: 02/17/2003  . Smokeless tobacco: Former Systems developer     Comment: vapor cigrarette  . Alcohol use No  . Drug use:     Types: Cocaine, Marijuana, Heroin     Comment: 04/25/2013 "abused in the past ; stopped  11/13/2002  . Sexual activity: Not on file   Other Topics Concern  . Not on file   Social History Narrative   Patient reports he is divorced. 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   On disability otherwise, he is a vapor smoker, 3 caffeinated beverages daily    Family History  Problem Relation Age of Onset  . Hypertension Father   . Other Father     amputation  . Hypertension Mother     Current Outpatient Prescriptions  Medication Sig Dispense Refill  . aspirin 325 MG tablet Take 325 mg by mouth 2 (two) times daily.     . citalopram (CELEXA) 10 MG tablet Take 10 mg by mouth daily.    . finasteride (PROSCAR) 5 MG tablet Take 5 mg by mouth daily before breakfast.     . gabapentin (NEURONTIN) 100 MG capsule Take 2 capsules (200 mg total) by mouth at bedtime. 60 capsule 3  . magnesium oxide (MAG-OX) 400 MG tablet Take 400 mg by mouth 2 (two) times daily.     . mycophenolate (CELLCEPT) 250 MG capsule Take 250-500 mg by mouth See admin instructions. 500 mg in the morning and 250 mg in the evening    . NIFEdipine (PROCARDIA-XL/ADALAT-CC/NIFEDICAL-XL) 30 MG 24 hr tablet Take 30 mg by mouth at bedtime.      Marland Kitchen omeprazole (PRILOSEC) 20 MG capsule Take 20 mg by mouth daily. Reported on 06/06/2015    . pregabalin (LYRICA) 50 MG capsule Take 1 capsule (50 mg total) by mouth 3 (three) times daily. 30 capsule 5  . rosuvastatin (CRESTOR) 5 MG tablet Take 5 mg by mouth at bedtime.     . tacrolimus (PROGRAF) 1 MG capsule Take 3 mg by mouth 2 (two) times daily.    . tadalafil (CIALIS) 20 MG tablet Take 20 mg by mouth daily as needed for erectile dysfunction.    . tamsulosin (FLOMAX) 0.4 MG CAPS capsule Take 0.4 mg by mouth daily.    . Vitamin D, Ergocalciferol, (DRISDOL) 50000 units CAPS capsule Take 1 capsule (50,000 Units total) by mouth every 7 (  seven) days. 12 capsule 0  . VYVANSE 20 MG capsule Take 20 mg by mouth 2 (two) times daily. In the morning and at Noon  0  . zolpidem (AMBIEN) 10 MG tablet Take 10 mg by mouth at bedtime as needed for sleep.     No current facility-administered medications for this visit.      Allergies  Allergen Reactions  . Tape Rash    Silk tape    REVIEW OF SYSTEMS:  (Positives checked otherwise negative)  CARDIOVASCULAR:   [ ]  chest pain,  [ ]  chest pressure,  [ ]  palpitations,  [ ]  shortness of breath when laying flat,  [ ]  shortness of breath with exertion,   [ ]  pain in feet when walking,  [ ]  pain in feet when laying flat, [ ]  history of blood clot in veins (DVT),  [ ]  history of phlebitis,  [ ]  swelling in legs,  [ ]  varicose veins  PULMONARY:   [ ]  productive cough,  [ ]  asthma,  [ ]  wheezing  NEUROLOGIC:   [ ]  weakness in arms or legs,  [ ]  numbness in arms or legs,  [ ]  difficulty speaking or slurred speech,  [ ]  temporary loss of vision in one eye,  [ ]  dizziness  HEMATOLOGIC:   [ ]  bleeding problems,  [ ]  problems with blood clotting too easily  MUSCULOSKEL:   [ ]  joint pain, [ ]  joint swelling  GASTROINTEST:   [ ]  vomiting blood,  [ ]  blood in stool     GENITOURINARY:   [ ]  burning with urination,  [ ]  blood in  urine  PSYCHIATRIC:   [ ]  history of major depression  INTEGUMENTARY:   [ ]  rashes,  [ ]  ulcers  CONSTITUTIONAL:   [ ]  fever,  [ ]  chills     Physical Examination  There were no vitals filed for this visit.  There is no height or weight on file to calculate BMI.  General: A&O x 3, WD, thin  Pulmonary: Sym exp, good air movt, CTAB, no rales, rhonchi, & wheezing  Cardiac: RRR, Nl S1, S2, no Murmurs, rubs or gallops  Vascular: Vessel Right Left  Radial Palpable Palpable  Brachial Palpable Palpable  Carotid Palpable, without bruit Palpable, without bruit  Aorta Not palpable N/A  Femoral Palpable Palpable  Popliteal Not palpable Not palpable  PT Palpable Palpable  DP Palpable Palpable   Gastrointestinal: soft, NTND, no G/R, no HSM, no masses, no CVAT B  Musculoskeletal: M/S 5/5 throughout , Extremities without ischemic changes, all incisions are healed, edema resolved in both legs  Neurologic: Pain and light touch intact in extremities , Motor exam as listed above   Non-Invasive Vascular Imaging  ABI (Date: 04/30/16)  R:   ABI: 0.92 (0.90),   DP: tri  PT: tri  TBI:  0.67  L:   ABI: 0.88 (0.99),   DP: bi  PT: bi  TBI: 0.74   Aortoiliac Duplex(Date: 04/30/16)  Ao: 86 c/s (mono)  R iliac: 213-229 c/s (in-stent), PSV 405 c/s  L iliac: 84-121 c/s (in-stent), PSV 349 c/s  B Bypass Duplex(Date: 04/30/16)  Widely patent bilateral fem-pop bypasses   Medical Decision Making  Scott Chavez is a 61 y.o. ([birth year]) male who presents with: resolved BLE intermittent claudication, s/p B CIA PTA+S, B fem-pop bypasses with GSV, CKT   Pt has a significant drop off in L ABI, which corresponds to a L EIA stenosis on that  side.  This patient has known calcific atherosclerosis in his iliac arteries, so I am not surprised with the findings.  Based on the patient's vascular studies and examination, I have offered the patient:  Aortogram, bilateral pelvic angiogram, possible intervention. I discussed with the patient the nature of angiographic procedures, especially the limited patencies of any endovascular intervention.   The patient is aware of that the risks of an angiographic procedure include but are not limited to: bleeding, infection, access site complications, renal failure, embolization, rupture of vessel, dissection, arteriovenous fistula, possible need for emergent surgical intervention, possible need for surgical procedures to treat the patient's pathology, anaphylactic reaction to contrast, and stroke and death.   The patient is aware of the risks and agrees to proceed.  He is scheduled for 8 JAN 17  The patient is currently on a statin: not medically indicated.  The patient is currently on an anti-platelet: aspirin.  Thank you for allowing [place] to participate in this patient's care.   [name], MD [facility] [place] Office: [phone] Pager: [phone]

## 2016-05-04 NOTE — Assessment & Plan Note (Signed)
Now considered cured.  Pleased with resultts.

## 2016-05-07 ENCOUNTER — Encounter: Payer: Self-pay | Admitting: Vascular Surgery

## 2016-05-07 ENCOUNTER — Other Ambulatory Visit: Payer: Self-pay

## 2016-05-07 ENCOUNTER — Ambulatory Visit (INDEPENDENT_AMBULATORY_CARE_PROVIDER_SITE_OTHER): Payer: Medicare Other | Admitting: Vascular Surgery

## 2016-05-07 VITALS — BP 136/78 | HR 86 | Temp 97.4°F | Resp 16 | Ht 70.0 in | Wt 150.0 lb

## 2016-05-07 DIAGNOSIS — I70213 Atherosclerosis of native arteries of extremities with intermittent claudication, bilateral legs: Secondary | ICD-10-CM

## 2016-05-10 ENCOUNTER — Encounter (HOSPITAL_COMMUNITY)
Admission: RE | Disposition: A | Discharge status: Home / Self Care | Payer: Self-pay | Source: Ambulatory Visit | Attending: Vascular Surgery

## 2016-05-10 ENCOUNTER — Ambulatory Visit (HOSPITAL_COMMUNITY)
Admission: RE | Admit: 2016-05-10 | Discharge: 2016-05-10 | Disposition: A | Discharge status: Home / Self Care | Payer: Medicare Other | Source: Ambulatory Visit | Attending: Vascular Surgery | Admitting: Vascular Surgery

## 2016-05-10 ENCOUNTER — Other Ambulatory Visit: Payer: Self-pay | Admitting: *Deleted

## 2016-05-10 DIAGNOSIS — F17290 Nicotine dependence, other tobacco product, uncomplicated: Secondary | ICD-10-CM | POA: Insufficient documentation

## 2016-05-10 DIAGNOSIS — Z7982 Long term (current) use of aspirin: Secondary | ICD-10-CM | POA: Diagnosis not present

## 2016-05-10 DIAGNOSIS — K648 Other hemorrhoids: Secondary | ICD-10-CM | POA: Diagnosis not present

## 2016-05-10 DIAGNOSIS — K219 Gastro-esophageal reflux disease without esophagitis: Secondary | ICD-10-CM | POA: Diagnosis not present

## 2016-05-10 DIAGNOSIS — B1920 Unspecified viral hepatitis C without hepatic coma: Secondary | ICD-10-CM | POA: Insufficient documentation

## 2016-05-10 DIAGNOSIS — N186 End stage renal disease: Secondary | ICD-10-CM | POA: Insufficient documentation

## 2016-05-10 DIAGNOSIS — I739 Peripheral vascular disease, unspecified: Secondary | ICD-10-CM

## 2016-05-10 DIAGNOSIS — I120 Hypertensive chronic kidney disease with stage 5 chronic kidney disease or end stage renal disease: Secondary | ICD-10-CM | POA: Diagnosis not present

## 2016-05-10 DIAGNOSIS — Z992 Dependence on renal dialysis: Secondary | ICD-10-CM | POA: Insufficient documentation

## 2016-05-10 DIAGNOSIS — I70213 Atherosclerosis of native arteries of extremities with intermittent claudication, bilateral legs: Secondary | ICD-10-CM | POA: Insufficient documentation

## 2016-05-10 DIAGNOSIS — I70219 Atherosclerosis of native arteries of extremities with intermittent claudication, unspecified extremity: Secondary | ICD-10-CM | POA: Diagnosis present

## 2016-05-10 DIAGNOSIS — Z8249 Family history of ischemic heart disease and other diseases of the circulatory system: Secondary | ICD-10-CM | POA: Diagnosis not present

## 2016-05-10 DIAGNOSIS — E7800 Pure hypercholesterolemia, unspecified: Secondary | ICD-10-CM | POA: Insufficient documentation

## 2016-05-10 DIAGNOSIS — Z905 Acquired absence of kidney: Secondary | ICD-10-CM | POA: Insufficient documentation

## 2016-05-10 DIAGNOSIS — Z48812 Encounter for surgical aftercare following surgery on the circulatory system: Secondary | ICD-10-CM

## 2016-05-10 HISTORY — PX: PERIPHERAL VASCULAR CATHETERIZATION: SHX172C

## 2016-05-10 LAB — POCT I-STAT, CHEM 8
BUN: 11 mg/dL (ref 6–20)
CALCIUM ION: 1.27 mmol/L (ref 1.15–1.40)
CHLORIDE: 106 mmol/L (ref 101–111)
Creatinine, Ser: 1.2 mg/dL (ref 0.61–1.24)
GLUCOSE: 108 mg/dL — AB (ref 65–99)
HCT: 42 % (ref 39.0–52.0)
Hemoglobin: 14.3 g/dL (ref 13.0–17.0)
Potassium: 4 mmol/L (ref 3.5–5.1)
SODIUM: 140 mmol/L (ref 135–145)
TCO2: 26 mmol/L (ref 0–100)

## 2016-05-10 SURGERY — ABDOMINAL AORTOGRAM W/LOWER EXTREMITY
Anesthesia: LOCAL

## 2016-05-10 MED ORDER — IODIXANOL 320 MG/ML IV SOLN
INTRAVENOUS | Status: DC | PRN
  Administered 2016-05-10: 85 mL via INTRA_ARTERIAL

## 2016-05-10 MED ORDER — FENTANYL CITRATE (PF) 100 MCG/2ML IJ SOLN
INTRAMUSCULAR | Status: DC | PRN
  Administered 2016-05-10: 50 ug via INTRAVENOUS

## 2016-05-10 MED ORDER — ACETAMINOPHEN 325 MG PO TABS: 650.0000 mg | ORAL_TABLET | ORAL | Status: DC | PRN

## 2016-05-10 MED ORDER — SODIUM CHLORIDE 0.9 % IV SOLN: 1.0000 mL/kg/h | INTRAVENOUS | Status: DC

## 2016-05-10 MED ORDER — LIDOCAINE HCL (PF) 1 % IJ SOLN
INTRAMUSCULAR | Status: DC | PRN
  Administered 2016-05-10: 15 mL via INTRADERMAL

## 2016-05-10 MED ORDER — HEPARIN (PORCINE) IN NACL 2-0.9 UNIT/ML-% IJ SOLN
INTRAMUSCULAR | Status: AC
  Filled 2016-05-10: qty 1000

## 2016-05-10 MED ORDER — HEPARIN (PORCINE) IN NACL 2-0.9 UNIT/ML-% IJ SOLN
INTRAMUSCULAR | Status: DC | PRN
  Administered 2016-05-10: 1000 mL

## 2016-05-10 MED ORDER — MIDAZOLAM HCL 2 MG/2ML IJ SOLN
INTRAMUSCULAR | Status: AC
  Filled 2016-05-10: qty 2

## 2016-05-10 MED ORDER — SODIUM CHLORIDE 0.9 % IV SOLN
INTRAVENOUS | Status: DC
  Administered 2016-05-10: 14:00:00 via INTRAVENOUS

## 2016-05-10 MED ORDER — MIDAZOLAM HCL 2 MG/2ML IJ SOLN
INTRAMUSCULAR | Status: DC | PRN
  Administered 2016-05-10: 1 mg via INTRAVENOUS

## 2016-05-10 MED ORDER — FENTANYL CITRATE (PF) 100 MCG/2ML IJ SOLN
INTRAMUSCULAR | Status: AC
  Filled 2016-05-10: qty 2

## 2016-05-10 MED ORDER — LIDOCAINE HCL (PF) 1 % IJ SOLN
INTRAMUSCULAR | Status: AC
  Filled 2016-05-10: qty 30

## 2016-05-10 SURGICAL SUPPLY — 8 items
CATH OMNI FLUSH 5F 65CM (CATHETERS) ×1 IMPLANT
KIT MICROINTRODUCER STIFF 5F (SHEATH) ×1 IMPLANT
KIT PV (KITS) ×2 IMPLANT
SHEATH PINNACLE 5F 10CM (SHEATH) ×1 IMPLANT
SYR MEDRAD MARK V 150ML (SYRINGE) ×2 IMPLANT
TRANSDUCER W/STOPCOCK (MISCELLANEOUS) ×2 IMPLANT
TRAY PV CATH (CUSTOM PROCEDURE TRAY) ×2 IMPLANT
WIRE BENTSON .035X145CM (WIRE) ×1 IMPLANT

## 2016-05-10 NOTE — Progress Notes (Signed)
Site area: Left groin a 5 french arterial sheath was removed  Site Prior to Removal:  Level 0  Pressure Applied For 15 MINUTES    Bdrest Beginning at  1530p  Manual:   Yes.    Patient Status During Pull:  stable  Post Pull Groin Site:  Level 0  Post Pull Instructions Given:  Yes.    Post Pull Pulses Present:  Yes.    Dressing Applied:  Yes.    Comments:  VS remain stable during sheath pull

## 2016-05-10 NOTE — H&P (View-Only) (Signed)
Established Previous Bypass   History of Present Illness  Scott Chavez is a 61 y.o. (January 23, 1956) male who presents with chief complaint: resolving neuropathic pain in R foot. Previous operation(s) include:   1. L iliofem EA w/ BPA, L CFA to BK pop BPG with NR ips GSV (04/24/13) 2. PTA+S R CIA and L CIA (10/25/13) 3. OA+PTA R F-P (12/13/13) (ERROR in op note in laterality) 4. R iliofem EA w/ BPA, extended R profundaplasty, R CFA to BK pop BPG w/ NR ips GSV (04/17/15) 5.  Excision of thrombosed L RC AVF  The patient's symptoms have improved. His intermittent claudicationsx are resolved.  His sx are some aching in his R pelvis. The patient's treatment regimen currently included: maximal medical management and walking.  Past Medical History:  Diagnosis Date  . Anemia    pt. not sure, but reports he is taking Fe for one month now.  . Aorto-iliac disease ([place])   . Bladder dysfunction   . ESRD (end stage renal disease) [facility])    [facility] , followed by Dr. Moshe Cipro   . GERD (gastroesophageal reflux disease)   . Hemodialysis patient [facility])    "on dialysis 1998-2010" 04/25/2013)  . Hepatitis C   . History of renal failure   . Hypercholesteremia   . Hypertension   . Internal hemorrhoids    Colonoscopy 2010 and anoscopy 2015  . Peripheral vascular disease ([place])   . Pneumonia   . Self-catheterizes urinary bladder    pt. choice, but he reports that he desires to do this to avoid retention     Past Surgical History:  Procedure Laterality Date  . ABDOMINAL AORTAGRAM N/A 03/09/2013   Procedure: ABDOMINAL Maxcine Ham;  Surgeon: Elam Dutch, MD;  Location: [facility] CATH LAB;  Service: Cardiovascular;  Laterality: N/A;  . AV FISTULA PLACEMENT Left 1998   "removed 2011" (04/25/2013)  . AV FISTULA REPAIR Left    removal with partial vein removed  . COLONOSCOPY    . ENDARTERECTOMY FEMORAL Right 04/17/2015   Procedure: ENDARTERECTOMY RIGHT  ILIOFEMORAL WITH PROFUNDOPLASTY;  Surgeon: Conrad Dennis, MD;  Location: [place];  Service: Vascular;  Laterality: Right;  . FEMORAL-POPLITEAL BYPASS GRAFT Left 04/24/2013   Procedure: BYPASS GRAFT COMMON FEMORALARTERY-BELOW KNEE POPLITEAL ARTERY WITH GREATER SAPHENOUS VEIN;  Surgeon: Conrad Panama, MD;  Location: [place];  Service: Vascular;  Laterality: Left;  . FEMORAL-POPLITEAL BYPASS GRAFT Right 04/17/2015   Procedure: BYPASS GRAFT RIGHT FEMORALTO BELOW KNEE POPLITEAL ARTERY USING RIGHT NON REVERSED GREATER SAPPHENOUS VEIN;  Surgeon: Conrad Ho-Ho-Kus, MD;  Location: [place];  Service: Vascular;  Laterality: Right;  . INTRAOPERATIVE ARTERIOGRAM Left 04/24/2013   Procedure: INTRA OPERATIVE ARTERIOGRAM;  Surgeon: Conrad Irving, MD;  Location: [place];  Service: Vascular;  Laterality: Left;  . LOWER EXTREMITY ANGIOGRAM Bilateral 03/09/2013   Procedure: LOWER EXTREMITY ANGIOGRAM;  Surgeon: Elam Dutch, MD;  Location: [facility] CATH LAB;  Service: Cardiovascular;  Laterality: Bilateral;  . LOWER EXTREMITY ANGIOGRAM Right 10/25/2013   Procedure: LOWER EXTREMITY ANGIOGRAM;  Surgeon: Conrad Waynesburg, MD;  Location: [facility] CATH LAB;  Service: Cardiovascular;  Laterality: Right;  . LOWER EXTREMITY ANGIOGRAM Right 12/13/2013   Procedure: LOWER EXTREMITY ANGIOGRAM;  Surgeon: Conrad Giltner, MD;  Location: [facility] CATH LAB;  Service: Cardiovascular;  Laterality: Right;  . NEPHRECTOMY RECIPIENT  2010  . PATCH ANGIOPLASTY Right 04/17/2015   Procedure: Rueben Bash PATCH ANGIOPLASTY, RIGHT ILEOFEMORAL;  Surgeon: Conrad Tresckow, MD;  [facility];  Service: Vascular;  Laterality: Right;  . PERIPHERAL VASCULAR CATHETERIZATION N/A 04/10/2015   Procedure: Abdominal Aortogram;  Surgeon: Conrad Belknap, MD;  Location: [place] CV LAB;  Service: Cardiovascular;  Laterality: N/A;  . s/p cadaveric renal transplant  December 2010   [place] Left 09/24/2015   Procedure: EXCISION OF THROMBOSED RADIOCEPHALIC ARTERIOVENOUS  FISTULA;  Surgeon: Conrad Old Bethpage, MD;  Location: [place];  Service: Vascular;  Laterality: Left;  Marland Kitchen VEIN HARVEST Right 04/17/2015   Procedure: RIGHT GREATER SAPPHENOUS VEIN HARVEST ;  Surgeon: Conrad , MD;  Location: [place];  Service: Vascular;  Laterality: Right;    Social History   Social History  . Marital status: Divorced    Spouse name: N/A  . Number of children: 3  . Years of education: N/A   Occupational History  . student    Social History Main Topics  . Smoking status: Former Smoker    Packs/day: 0.50    Years: 34.00    Types: E-cigarettes    Quit date: 02/17/2003  . Smokeless tobacco: Former Systems developer     Comment: vapor cigrarette  . Alcohol use No  . Drug use:     Types: Cocaine, Marijuana, Heroin     Comment: 04/25/2013 "abused in the past ; stopped  11/13/2002  . Sexual activity: Not on file   Other Topics Concern  . Not on file   Social History Narrative   Patient reports he is divorced. 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   On disability otherwise, he is a vapor smoker, 3 caffeinated beverages daily    Family History  Problem Relation Age of Onset  . Hypertension Father   . Other Father     amputation  . Hypertension Mother     Current Outpatient Prescriptions  Medication Sig Dispense Refill  . aspirin 325 MG tablet Take 325 mg by mouth 2 (two) times daily.     . citalopram (CELEXA) 10 MG tablet Take 10 mg by mouth daily.    . finasteride (PROSCAR) 5 MG tablet Take 5 mg by mouth daily before breakfast.     . gabapentin (NEURONTIN) 100 MG capsule Take 2 capsules (200 mg total) by mouth at bedtime. 60 capsule 3  . magnesium oxide (MAG-OX) 400 MG tablet Take 400 mg by mouth 2 (two) times daily.     . mycophenolate (CELLCEPT) 250 MG capsule Take 250-500 mg by mouth See admin instructions. 500 mg in the morning and 250 mg in the evening    . NIFEdipine (PROCARDIA-XL/ADALAT-CC/NIFEDICAL-XL) 30 MG 24 hr tablet Take 30 mg by mouth at bedtime.      Marland Kitchen omeprazole (PRILOSEC) 20 MG capsule Take 20 mg by mouth daily. Reported on 06/06/2015    . pregabalin (LYRICA) 50 MG capsule Take 1 capsule (50 mg total) by mouth 3 (three) times daily. 30 capsule 5  . rosuvastatin (CRESTOR) 5 MG tablet Take 5 mg by mouth at bedtime.     . tacrolimus (PROGRAF) 1 MG capsule Take 3 mg by mouth 2 (two) times daily.    . tadalafil (CIALIS) 20 MG tablet Take 20 mg by mouth daily as needed for erectile dysfunction.    . tamsulosin (FLOMAX) 0.4 MG CAPS capsule Take 0.4 mg by mouth daily.    . Vitamin D, Ergocalciferol, (DRISDOL) 50000 units CAPS capsule Take 1 capsule (50,000 Units total) by mouth every 7 (  seven) days. 12 capsule 0  . VYVANSE 20 MG capsule Take 20 mg by mouth 2 (two) times daily. In the morning and at Noon  0  . zolpidem (AMBIEN) 10 MG tablet Take 10 mg by mouth at bedtime as needed for sleep.     No current facility-administered medications for this visit.      Allergies  Allergen Reactions  . Tape Rash    Silk tape    REVIEW OF SYSTEMS:  (Positives checked otherwise negative)  CARDIOVASCULAR:   [ ]  chest pain,  [ ]  chest pressure,  [ ]  palpitations,  [ ]  shortness of breath when laying flat,  [ ]  shortness of breath with exertion,   [ ]  pain in feet when walking,  [ ]  pain in feet when laying flat, [ ]  history of blood clot in veins (DVT),  [ ]  history of phlebitis,  [ ]  swelling in legs,  [ ]  varicose veins  PULMONARY:   [ ]  productive cough,  [ ]  asthma,  [ ]  wheezing  NEUROLOGIC:   [ ]  weakness in arms or legs,  [ ]  numbness in arms or legs,  [ ]  difficulty speaking or slurred speech,  [ ]  temporary loss of vision in one eye,  [ ]  dizziness  HEMATOLOGIC:   [ ]  bleeding problems,  [ ]  problems with blood clotting too easily  MUSCULOSKEL:   [ ]  joint pain, [ ]  joint swelling  GASTROINTEST:   [ ]  vomiting blood,  [ ]  blood in stool     GENITOURINARY:   [ ]  burning with urination,  [ ]  blood in  urine  PSYCHIATRIC:   [ ]  history of major depression  INTEGUMENTARY:   [ ]  rashes,  [ ]  ulcers  CONSTITUTIONAL:   [ ]  fever,  [ ]  chills     Physical Examination  There were no vitals filed for this visit.  There is no height or weight on file to calculate BMI.  General: A&O x 3, WD, thin  Pulmonary: Sym exp, good air movt, CTAB, no rales, rhonchi, & wheezing  Cardiac: RRR, Nl S1, S2, no Murmurs, rubs or gallops  Vascular: Vessel Right Left  Radial Palpable Palpable  Brachial Palpable Palpable  Carotid Palpable, without bruit Palpable, without bruit  Aorta Not palpable N/A  Femoral Palpable Palpable  Popliteal Not palpable Not palpable  PT Palpable Palpable  DP Palpable Palpable   Gastrointestinal: soft, NTND, no G/R, no HSM, no masses, no CVAT B  Musculoskeletal: M/S 5/5 throughout , Extremities without ischemic changes, all incisions are healed, edema resolved in both legs  Neurologic: Pain and light touch intact in extremities , Motor exam as listed above   Non-Invasive Vascular Imaging  ABI (Date: 04/30/16)  R:   ABI: 0.92 (0.90),   DP: tri  PT: tri  TBI:  0.67  L:   ABI: 0.88 (0.99),   DP: bi  PT: bi  TBI: 0.74   Aortoiliac Duplex(Date: 04/30/16)  Ao: 86 c/s (mono)  R iliac: 213-229 c/s (in-stent), PSV 405 c/s  L iliac: 84-121 c/s (in-stent), PSV 349 c/s  B Bypass Duplex(Date: 04/30/16)  Widely patent bilateral fem-pop bypasses   Medical Decision Making  Scott Chavez is a 61 y.o. (May 22, 1955) male who presents with: resolved BLE intermittent claudication, s/p B CIA PTA+S, B fem-pop bypasses with GSV, CKT   Pt has a significant drop off in L ABI, which corresponds to a L EIA stenosis on that  side.  This patient has known calcific atherosclerosis in his iliac arteries, so I am not surprised with the findings.  Based on the patient's vascular studies and examination, I have offered the patient:  Aortogram, bilateral pelvic angiogram, possible intervention. I discussed with the patient the nature of angiographic procedures, especially the limited patencies of any endovascular intervention.   The patient is aware of that the risks of an angiographic procedure include but are not limited to: bleeding, infection, access site complications, renal failure, embolization, rupture of vessel, dissection, arteriovenous fistula, possible need for emergent surgical intervention, possible need for surgical procedures to treat the patient's pathology, anaphylactic reaction to contrast, and stroke and death.   The patient is aware of the risks and agrees to proceed.  He is scheduled for 8 JAN 17  The patient is currently on a statin: not medically indicated.  The patient is currently on an anti-platelet: aspirin.  Thank you for allowing [place] to participate in this patient's care.   [name], MD [facility] [place] Office: [phone] Pager: [phone]

## 2016-05-10 NOTE — Interval H&P Note (Signed)
History and Physical Interval Note:  05/10/2016 1:26 PM  Scott Chavez  has presented today for surgery, with the diagnosis of pvd   claudication  The various methods of treatment have been discussed with the patient and family. After consideration of risks, benefits and other options for treatment, the patient has consented to  Procedure(s): Abdominal Aortogram w/Lower Extremity (N/A) as a surgical intervention .  The patient's history has been reviewed, patient examined, no change in status, stable for surgery.  I have reviewed the patient's chart and labs.  Questions were answered to the patient's satisfaction.     [name]

## 2016-05-10 NOTE — Discharge Instructions (Signed)
Femoral Site Care °Introduction °Refer to this sheet in the next few weeks. These instructions provide you with information about caring for yourself after your procedure. Your health care provider may also give you more specific instructions. Your treatment has been planned according to current medical practices, but problems sometimes occur. Call your health care provider if you have any problems or questions after your procedure. °What can I expect after the procedure? °After your procedure, it is typical to have the following: °· Bruising at the site that usually fades within 1-2 weeks. °· Blood collecting in the tissue (hematoma) that may be painful to the touch. It should usually decrease in size and tenderness within 1-2 weeks. °Follow these instructions at home: °· Take medicines only as directed by your health care provider. °· You may shower 24-48 hours after the procedure or as directed by your health care provider. Remove the bandage (dressing) and gently wash the site with plain soap and water. Pat the area dry with a clean towel. Do not rub the site, because this may cause bleeding. °· Do not take baths, swim, or use a hot tub until your health care provider approves. °· Check your insertion site every day for redness, swelling, or drainage. °· Do not apply powder or lotion to the site. °· Limit use of stairs to twice a day for the first 2-3 days or as directed by your health care provider. °· Do not squat for the first 2-3 days or as directed by your health care provider. °· Do not lift over 10 lb (4.5 kg) for 5 days after your procedure or as directed by your health care provider. °· Ask your health care provider when it is okay to: °¨ Return to work or school. °¨ Resume usual physical activities or sports. °¨ Resume sexual activity. °· Do not drive home if you are discharged the same day as the procedure. Have someone else drive you. °· You may drive 24 hours after the procedure unless otherwise  instructed by your health care provider. °· Do not operate machinery or power tools for 24 hours after the procedure or as directed by your health care provider. °· If your procedure was done as an outpatient procedure, which means that you went home the same day as your procedure, a responsible adult should be with you for the first 24 hours after you arrive home. °· Keep all follow-up visits as directed by your health care provider. This is important. °Contact a health care provider if: °· You have a fever. °· You have chills. °· You have increased bleeding from the site. Hold pressure on the site. °Get help right away if: °· You have unusual pain at the site. °· You have redness, warmth, or swelling at the site. °· You have drainage (other than a small amount of blood on the dressing) from the site. °· The site is bleeding, and the bleeding does not stop after 30 minutes of holding steady pressure on the site. °· Your leg or foot becomes pale, cool, tingly, or numb. °This information is not intended to replace advice given to you by your health care provider. Make sure you discuss any questions you have with your health care provider. °Document Released: 12/21/2013 Document Revised: 09/25/2015 Document Reviewed: 11/06/2013 °© 2017 Elsevier ° °

## 2016-05-10 NOTE — Op Note (Signed)
OPERATIVE NOTE   PROCEDURE: 1.  Left common femoral artery cannulation under ultrasound guidance 2.  Placement of catheter in aorta 3.  Aortogram 4.  Conscious sedation for 25 minutes 5.  Bilateral pelvic angiogram 6.  Left leg runoff via sheath  PRE-OPERATIVE DIAGNOSIS: drop off in left ABI  POST-OPERATIVE DIAGNOSIS: same as above   SURGEON: [name], MD  ANESTHESIA: conscious sedation  ESTIMATED BLOOD LOSS: 30 cc  CONTRAST: 85 cc  FINDING(S):  Aorta: patent  Superior mesenteric artery: patent Celiac artery: patent   Right Left  RA Not visualized Not visualized  CIA Patent with calcific disease, Patent stent Patent with calcific diseasem Patent stent  EIA Patent, distal stenosis with 3 mm residual lumen Patent, distal stenosis with 4-5 mm residual lumen  IIA Occluded Occluded  CFA Patent Patent  SFA  Patent proximally, occludes in proximal 1/3, widely patent femoropopliteal bypass  PFA  Patent with distal disease in medial profunda branch  Pop  Patent, small  Trif  Patent  AT  Occluded  Pero  Patent, mid-segment serial stenoses >75% and >90%, hypertrophied collateral around stenoses  PT  Patent proximally, occludes shortly after takeoff   SPECIMEN(S):  none  INDICATIONS:   Scott Chavez is a 61 y.o. male who presents with abnormal iliac arterial duplex and decrease in left leg ABI.  The patient presents for: aortogram, bilateral pelvic angiogram, and possible left iliac intervention.  I discussed with the patient the nature of angiographic procedures, especially the limited patencies of any endovascular intervention.  The patient is aware of that the risks of an angiographic procedure include but are not limited to: bleeding, infection, access site complications, renal failure, embolization, rupture of vessel, dissection, possible need for emergent surgical intervention, possible need for surgical procedures to treat the patient's pathology, and stroke and  death.  The patient is aware of the risks and agrees to proceed.  DESCRIPTION: After full informed consent was obtained from the patient, the patient was brought back to the angiography suite.  The patient was placed supine upon the angiography table and connected to cardiopulmonary monitoring equipment.  The patient was then given conscious sedation, the amounts of which are documented in the patient's chart.  A circulating radiologic technician maintained continuous monitoring of the patient's cardiopulmonary status.  Additionally, the control room radiologic technician provided backup monitoring throughout the procedure.  The patient was prepped and drape in the standard fashion for an angiographic procedure.  At this point, attention was turned to the left groin.  Under ultrasound guidance, the subcutaneous tissue surrounding the left common femoral artery was anesthesized with 1% lidocaine with epinephrine.  The artery was then cannulated with a micropuncture needle.  The microwire was advanced into the iliac arterial system.  The needle was exchanged for a microsheath, which was loaded into the common femoral artery over the wire.  The microwire was exchanged for a [facility] wire which was advanced into the aorta.  The microsheath was then exchanged for a 5-Fr sheath which was loaded into the common femoral artery.  The Omniflush catheter was then loaded over the wire up to the level of L1.  The catheter was connected to the power injector circuit.  After de-airring and de-clotting the circuit, a power injector aortogram was completed.  The catheter was pulled down to the distal aorta.  A bilateral pelvic angiogram was completed at right and left obliques.  The findings are listed above.    I replaced the  wire into the catheter, straightening out the crook in the catheter.  Both were removed from the sheath together.  I connected the left sheath to the power injector circuit.  An automated left leg  runoff was completed.  The findings are listed above.  The sheath was aspirated.  No clots were present and the sheath was reloaded with heparinized saline.    Based on the images, a right iliofemoral endarterectomy with patch angioplasty would be the priority to avoid right leg ischemia.  As there remains a 3 mm lumen, there is no immediately need for intervention.  I discussed with the patient proceeding with such in the summer after he is done with school.  A left iliofemoral endarterectomy with patch angioplasty with peroneal artery orbital atherectomy and angioplasty will be also be needed, again on an elective basis.   COMPLICATIONS: none  CONDITION: stable   [name], MD, [facility] [place] Office: [phone] Pager: [phone]  05/10/2016, 3:25 PM

## 2016-05-11 ENCOUNTER — Encounter (HOSPITAL_COMMUNITY): Payer: Self-pay | Admitting: Vascular Surgery

## 2016-05-11 NOTE — Progress Notes (Signed)
[name] [address], [place] [address] Phone: [phone] Subjective:      CC: Neck pain f/u, Low back pain follow-up  [id number]  Scott Chavez is a 61 y.o. male coming in with complaint of neck pain. Past medical history significant for multiple comorbidities.  Patient was found to have poor posture, given ergonomic adjustments, patient responded fairly well to osteopathic manipulation.  Patient states he continues to do the exercises on a fairly regular regimen. Patient states some mild tightness of the neck but nothing severe. No radiation of pain. Mild tightening since last exam but did respond very well previously.    patient had x-rays at last exam. X-rays the patient's lumbar spine were independently visualized by me showing no significant bony abnormality patient's cervical spine showing moderate degenerative disc disease at C5-C6. Both x-rays that show significant calcific changes of the blood vessels.  Past Medical History:  Diagnosis Date  . Anemia    pt. not sure, but reports he is taking Fe for one month now.  . Aorto-iliac disease ([place])   . Bladder dysfunction   . ESRD (end stage renal disease) [facility])    [facility] followed by Dr. Moshe Cipro   . GERD (gastroesophageal reflux disease)   . Hemodialysis patient [facility])    "on dialysis 1998-2010" 04/25/2013)  . Hepatitis C   . History of renal failure   . Hypercholesteremia   . Hypertension   . Internal hemorrhoids    Colonoscopy 2010 and anoscopy 2015  . Peripheral vascular disease ([place])   . Pneumonia   . Self-catheterizes urinary bladder    pt. choice, but he reports that he desires to do this to avoid retention    Past Surgical History:  Procedure Laterality Date  . ABDOMINAL AORTAGRAM N/A 03/09/2013   Procedure: ABDOMINAL Maxcine Ham;  Surgeon: Elam Dutch, MD;  Location: [facility] CATH LAB;  Service: Cardiovascular;  Laterality: N/A;  .  AV FISTULA PLACEMENT Left 1998   "removed 2011" (04/25/2013)  . AV FISTULA REPAIR Left    removal with partial vein removed  . COLONOSCOPY    . ENDARTERECTOMY FEMORAL Right 04/17/2015   Procedure: ENDARTERECTOMY RIGHT ILIOFEMORAL WITH PROFUNDOPLASTY;  Surgeon: Conrad Delevan, MD;  Location: [place];  Service: Vascular;  Laterality: Right;  . FEMORAL-POPLITEAL BYPASS GRAFT Left 04/24/2013   Procedure: BYPASS GRAFT COMMON FEMORALARTERY-BELOW KNEE POPLITEAL ARTERY WITH GREATER SAPHENOUS VEIN;  Surgeon: Conrad [place], MD;  Location: [place];  Service: Vascular;  Laterality: Left;  . FEMORAL-POPLITEAL BYPASS GRAFT Right 04/17/2015   Procedure: BYPASS GRAFT RIGHT FEMORALTO BELOW KNEE POPLITEAL ARTERY USING RIGHT NON REVERSED GREATER SAPPHENOUS VEIN;  Surgeon: Conrad [place], MD;  Location: [place];  Service: Vascular;  Laterality: Right;  . INTRAOPERATIVE ARTERIOGRAM Left 04/24/2013   Procedure: INTRA OPERATIVE ARTERIOGRAM;  Surgeon: Conrad [place], MD;  Location: [place];  Service: Vascular;  Laterality: Left;  . LOWER EXTREMITY ANGIOGRAM Bilateral 03/09/2013   Procedure: LOWER EXTREMITY ANGIOGRAM;  Surgeon: Elam Dutch, MD;  [facility] CATH LAB;  Service: Cardiovascular;  Laterality: Bilateral;  . LOWER EXTREMITY ANGIOGRAM Right 10/25/2013   Procedure: LOWER EXTREMITY ANGIOGRAM;  Surgeon: Conrad [place], MD;  Location: [facility] CATH LAB;  Service: Cardiovascular;  Laterality: Right;  . LOWER EXTREMITY ANGIOGRAM Right 12/13/2013   Procedure: LOWER EXTREMITY ANGIOGRAM;  Surgeon: Conrad [place], MD;  Location: [facility] CATH LAB;  Service: Cardiovascular;  Laterality: Right;  . NEPHRECTOMY RECIPIENT  2010  . PATCH ANGIOPLASTY Right 04/17/2015   Procedure: Rueben Bash PATCH ANGIOPLASTY, RIGHT ILEOFEMORAL;  Surgeon: Conrad [place], MD;  Location: [place];  Service: Vascular;  Laterality: Right;  . PERIPHERAL VASCULAR CATHETERIZATION N/A 04/10/2015   Procedure: Abdominal Aortogram;  Surgeon: Conrad [place], MD;  Location: [place] CV  LAB;  Service: Cardiovascular;  Laterality: N/A;  . PERIPHERAL VASCULAR CATHETERIZATION N/A 05/10/2016   Procedure: Abdominal Aortogram w/Lower Extremity;  Surgeon: Conrad [place], MD;  Location: [place] CV LAB;  Service: Cardiovascular;  Laterality: N/A;  . s/p cadaveric renal transplant  December 2010   [place] Left 09/24/2015   Procedure: EXCISION OF THROMBOSED RADIOCEPHALIC ARTERIOVENOUS FISTULA;  Surgeon: Conrad [place], MD;  Location: [place];  Service: Vascular;  Laterality: Left;  Marland Kitchen VEIN HARVEST Right 04/17/2015   Procedure: RIGHT GREATER SAPPHENOUS VEIN HARVEST ;  Surgeon: Conrad [place], MD;  Location: [place];  Service: Vascular;  Laterality: Right;   Social History   Social History  . Marital status: Divorced    Spouse name: N/A  . Number of children: 3  . Years of education: N/A   Occupational History  . student    Social History Main Topics  . Smoking status: Former Smoker    Packs/day: 0.50    Years: 34.00    Types: E-cigarettes    Quit date: 02/17/2003  . Smokeless tobacco: Former Systems developer     Comment: vapor cigrarette  . Alcohol use No  . Drug use:     Types: Cocaine, Marijuana, Heroin     Comment: 04/25/2013 "abused in the past ; stopped  11/13/2002  . Sexual activity: Not Asked   Other Topics Concern  . None   Social History Narrative   Patient reports he is divorced. 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   On disability otherwise, he is a vapor smoker, 3 caffeinated beverages daily   Allergies  Allergen Reactions  . Tape Rash    Silk tape   Family History  Problem Relation Age of Onset  . Hypertension Father   . Other Father     amputation  . Hypertension Mother     Past medical history, social, surgical and family history all reviewed in electronic medical record.  No pertanent information unless stated regarding to the chief complaint.   Review of Systems: No headache, visual changes, nausea, vomiting,  diarrhea, constipation, dizziness, abdominal pain, skin rash, fevers, chills, night sweats, weight loss, swollen lymph nodes, body aches, joint swelling, muscle aches, chest pain, shortness of breath, mood changes.      Objective  Blood pressure 130/84, pulse 72, height 5\' 10"  (1.778 m), weight 153 lb (69.4 kg), SpO2 98 %.  Systems examined below as of 05/12/16 General: NAD A&O x3 mood, affect normal  HEENT: Pupils equal, extraocular movements intact no nystagmus Respiratory: not short of breath at rest or with speaking Cardiovascular: No lower extremity edema, non tender Skin: Warm dry intact with no signs of infection or rash on extremities or on axial skeleton. Abdomen: Soft nontender, no masses Neuro: Cranial nerves  intact, neurovascularly intact in all extremities with 2+ DTRs and 2+ pulses. Lymph: No lymphadenopathy appreciated today  Gait normal with good balance and coordination.  MSK: Non tender with full range of motion and good stability and symmetric strength and tone of shoulders, elbows, wrist,  knee hips and ankles bilaterally.  Mild arthritic changes.  Neck: Inspection unremarkable. No palpable stepoffs. Negative Spurling's maneuver.  Mild tenderness still noted of the neck. Grip strength and sensation normal in bilateral hands Strength good C4 to T1 distribution No sensory change to C4 to T1 Negative Hoffman sign bilaterally Reflexes normal  Back Exam:  Inspection: Unremarkable  Motion: Flexion 45 deg, Extension 30 deg, Side Bending to 35 deg bilaterally,  Rotation to 35 deg bilaterally  SLR laying: Negative  XSLR laying: Negative  Palpable tenderness: More tenderness over the thoracic column cervical juncture l FABER: Negative Sensory change: Gross sensation intact to all lumbar and sacral dermatomes.  Reflexes: 2+ at both patellar tendons, 2+ at achilles tendons, Babinski's downgoing.  Strength at foot  Plantar-flexion: 5/5 Dorsi-flexion: 5/5 Eversion: 5/5  Inversion: 5/5  Leg strength  Quad: 5/5 Hamstring: 5/5 Hip flexor: 5/5 Hip abductors: 4/5 but symmetric Gait unremarkable.  Osteopathic findings Cervical C2 flexed rotated and side bent right C5 flexed rotated and side bent left T3 extended rotated and side bent right inhaled third rib T8 extended rotated and side bent left L3 flexed rotated and side bent right Sacrum right on right     Impression and Recommendations:     This case required medical decision making of moderate complexity.      Note: This dictation was prepared with Dragon dictation along with smaller phrase technology. Any transcriptional errors that result from this process are unintentional.

## 2016-05-12 ENCOUNTER — Ambulatory Visit (INDEPENDENT_AMBULATORY_CARE_PROVIDER_SITE_OTHER): Payer: Medicare Other | Admitting: Family Medicine

## 2016-05-12 ENCOUNTER — Encounter: Payer: Self-pay | Admitting: Family Medicine

## 2016-05-12 VITALS — BP 130/84 | HR 72 | Ht 70.0 in | Wt 153.0 lb

## 2016-05-12 DIAGNOSIS — M999 Biomechanical lesion, unspecified: Secondary | ICD-10-CM

## 2016-05-12 DIAGNOSIS — M542 Cervicalgia: Secondary | ICD-10-CM | POA: Diagnosis not present

## 2016-05-12 DIAGNOSIS — M9902 Segmental and somatic dysfunction of thoracic region: Secondary | ICD-10-CM

## 2016-05-12 DIAGNOSIS — G8929 Other chronic pain: Secondary | ICD-10-CM | POA: Diagnosis not present

## 2016-05-12 NOTE — Assessment & Plan Note (Signed)
Decision today to treat with OMT was based on Physical Exam  After verbal consent patient was treated with , ME techniques in cervical, thoracic lumbar and sacral  areas  Patient tolerated the procedure well with improvement in symptoms  Patient given exercises, stretches and lifestyle modifications  See medications in patient instructions if given  Patient will follow up in 12 weeks

## 2016-05-12 NOTE — Assessment & Plan Note (Signed)
Seems to be doing very well with conservative therapy. No significant change in management at this time. Encourage him to continue to work on strengthening exercises. Given increasing resistance. Patient and will follow-up with me again in 3 months.

## 2016-05-12 NOTE — Patient Instructions (Signed)
Good to see you  Keep working on the posture.  See me again in 3 months!  I am impressed.

## 2016-07-14 NOTE — Progress Notes (Signed)
Cardiology Office Note    Date:  07/20/2016   ID:  Scott Chavez, DOB November 11, 1955, MRN [id number]  PCP:  Louis Meckel, MD  Cardiologist:  Dr. Harrington Challenger  Chief Complaint: Ankle swelling   History of Present Illness:   Scott Chavez is a 61 y.o. male HTN, HLD, PVD, chronic diastolic dysfunction,  ESRD s/p kidney transplant (followed by Dr. Moshe Cipro) and GERD presents for ankle swelling.  He was doing well on cardiac stand point when last seen by Dr. Harrington Challenger 02/16/2016. Last echocardiogram 12/2014 showed LV EF of 65-70%, severe LVH and grade 1 DD. Normal Myoview 12/2014.  Patient have extensive hx of PVD that followed by Dr. Bridgett Larsson.  Abdominal Aortogram w/Lower Extremity  05/10/16  Conclusion     Patent aorta with patent mesenteric arteries  Patent bilateral common iliac arteries with extensive calcific atherosclerosis with patent common iliac artery stents  Bilateral occluded internal arteries  Patent transplant renal artery in right iliac fossa  Distal right external iliac artery stenosis: 3 mm residual lumen  Distal left external iliac artery stenosis: 4-5 mm residual lumen  Patent bilateral common femoral arteries  Occluded left superficial femoral artery with widely patent femoropopliteal bypass  Left profunda artery patent with distal disease evident in medial profunda branch  Single vessel runoff in left calf via peroneal artery which has mid-segment stenoses >75% and >90%  Hypertrophied collateral routes around mid-segment stenoses  Hypertrophied collaterals reconstitute posterior tibial and dorsalis pedis arteries     Here today for LE edema. Patient had noted lower extremity edema L > R while eating "lots of potato chips". Worse in evening. Better in morning. This has been resolved with cut back on chips. The patient denies nausea, vomiting, fever, chest pain, palpitations, shortness of breath, orthopnea, PND, dizziness, syncope, cough, congestion,  abdominal pain, hematochezia, melena.    Past Medical History:  Diagnosis Date  . Anemia    pt. not sure, but reports he is taking Fe for one month now.  . Aorto-iliac disease ([place])   . Bladder dysfunction   . ESRD (end stage renal disease) [facility])    transplant- [facility]/2010[facility] , followed by Dr. Moshe Cipro   . GERD (gastroesophageal reflux disease)   . Hemodialysis patient [facility])    "on dialysis 1998-2010" 04/25/2013)  . Hepatitis C   . History of renal failure   . Hypercholesteremia   . Hypertension   . Internal hemorrhoids    Colonoscopy 2010 and anoscopy 2015  . Peripheral vascular disease ([place])   . Pneumonia   . Self-catheterizes urinary bladder    pt. choice, but he reports that he desires to do this to avoid retention     Past Surgical History:  Procedure Laterality Date  . ABDOMINAL AORTAGRAM N/A 03/09/2013   Procedure: ABDOMINAL Maxcine Ham;  Surgeon: Elam Dutch, MD;  Location: [facility] CATH LAB;  Service: Cardiovascular;  Laterality: N/A;  . AV FISTULA PLACEMENT Left 1998   "removed 2011" (04/25/2013)  . AV FISTULA REPAIR Left    removal with partial vein removed  . COLONOSCOPY    . ENDARTERECTOMY FEMORAL Right 04/17/2015   Procedure: ENDARTERECTOMY RIGHT ILIOFEMORAL WITH PROFUNDOPLASTY;  Surgeon: Conrad Palisade, MD;  Location: [place];  Service: Vascular;  Laterality: Right;  . FEMORAL-POPLITEAL BYPASS GRAFT Left 04/24/2013   Procedure: BYPASS GRAFT COMMON FEMORALARTERY-BELOW KNEE POPLITEAL ARTERY WITH GREATER SAPHENOUS VEIN;  Surgeon: Conrad , MD;  Location: [place];  Service: Vascular;  Laterality: Left;  . FEMORAL-POPLITEAL  BYPASS GRAFT Right 04/17/2015   Procedure: BYPASS GRAFT RIGHT FEMORALTO BELOW KNEE POPLITEAL ARTERY USING RIGHT NON REVERSED GREATER SAPPHENOUS VEIN;  Surgeon: Conrad Rock House, MD;  Location: [place];  Service: Vascular;  Laterality: Right;  . INTRAOPERATIVE ARTERIOGRAM Left 04/24/2013   Procedure: INTRA OPERATIVE ARTERIOGRAM;  Surgeon: Conrad Mill Creek, MD;  Location: [place];  Service: Vascular;  Laterality: Left;  . LOWER EXTREMITY ANGIOGRAM Bilateral 03/09/2013   Procedure: LOWER EXTREMITY ANGIOGRAM;  Surgeon: Elam Dutch, MD;  Location: [facility] CATH LAB;  Service: Cardiovascular;  Laterality: Bilateral;  . LOWER EXTREMITY ANGIOGRAM Right 10/25/2013   Procedure: LOWER EXTREMITY ANGIOGRAM;  Surgeon: Conrad Le[place]ne, MD;  Location: [facility] CATH LAB;  Service: Cardiovascular;  Laterality: Right;  . LOWER EXTREMITY ANGIOGRAM Right 12/13/2013   Procedure: LOWER EXTREMITY ANGIOGRAM;  Surgeon: Conrad Rackerby, MD;  Location: [facility] CATH LAB;  Service: Cardiovascular;  Laterality: Right;  . NEPHRECTOMY RECIPIENT  2010  . PATCH ANGIOPLASTY Right 04/17/2015   Procedure: Rueben Bash PATCH ANGIOPLASTY, RIGHT ILEOFEMORAL;  Surgeon: Conrad Shoreacres, MD;  Location: [place];  Service: Vascular;  Laterality: Right;  . PERIPHERAL VASCULAR CATHETERIZATION N/A 04/10/2015   Procedure: Abdominal Aortogram;  Surgeon: Conrad [place], MD;  Location: [place] CV LAB;  Service: Cardiovascular;  Laterality: N/A;  . PERIPHERAL VASCULAR CATHETERIZATION N/A 05/10/2016   Procedure: Abdominal Aortogram w/Lower Extremity;  Surgeon: Conrad Stonegate, MD;  Location: [place] CV LAB;  Service: Cardiovascular;  Laterality: N/A;  . s/p cadaveric renal transplant  December 2010   [place] Left 09/24/2015   Procedure: EXCISION OF THROMBOSED RADIOCEPHALIC ARTERIOVENOUS FISTULA;  Surgeon: Conrad Intercourse, MD;  Location: [place];  Service: Vascular;  Laterality: Left;  Marland Kitchen VEIN HARVEST Right 04/17/2015   Procedure: RIGHT GREATER Wilkin ;  Surgeon: Conrad Carytown, MD;  Location: [place];  Service: Vascular;  Laterality: Right;    Current Medications: Prior to Admission medications   Medication Sig Start Date End Date Taking? Authorizing Provider  aspirin 325 MG tablet Take 325 mg by mouth 2 (two) times daily.     Historical Provider, MD  Cholecalciferol (VITAMIN  D3) 5000 units CAPS Take 5,000 Units by mouth 2 (two) times daily.    Historical Provider, MD  citalopram (CELEXA) 10 MG tablet Take 10 mg by mouth 3 (three) times daily.  09/18/15   Historical Provider, MD  Cyanocobalamin (VITAMIN B-12 PO) Take 1 tablet by mouth daily.    Historical Provider, MD  magnesium oxide (MAG-OX) 400 MG tablet Take 400 mg by mouth 2 (two) times daily.     Historical Provider, MD  mycophenolate (CELLCEPT) 250 MG capsule Take 250-500 mg by mouth See admin instructions. 500 mg in the morning and 250 mg in the evening    Historical Provider, MD  NIFEdipine (PROCARDIA-XL/ADALAT-CC/NIFEDICAL-XL) 30 MG 24 hr tablet Take 30 mg by mouth at bedtime.     Historical Provider, MD  omeprazole (PRILOSEC) 20 MG capsule Take 20 mg by mouth daily. Reported on 06/06/2015    Historical Provider, MD  pregabalin (LYRICA) 50 MG capsule Take 1 capsule (50 mg total) by mouth 3 (three) times daily. 01/08/16   Conrad Blaine, MD  Pyridoxine HCl (VITAMIN B-6 PO) Take 1 tablet by mouth daily.    Historical Provider, MD  rosuvastatin (CRESTOR) 5 MG tablet Take 5 mg by mouth at bedtime.     Historical Provider, MD  tacrolimus (PROGRAF) 1 MG capsule  Take 3 mg by mouth 2 (two) times daily.    Historical Provider, MD  tadalafil (CIALIS) 20 MG tablet Take 20 mg by mouth daily as needed for erectile dysfunction.    Historical Provider, MD  tamsulosin (FLOMAX) 0.4 MG CAPS capsule Take 0.4 mg by mouth daily.    Historical Provider, MD  VYVANSE 20 MG capsule Take 20 mg by mouth 2 (two) times daily. In the morning and at [facility] 08/25/15   Historical Provider, MD  zolpidem (AMBIEN) 10 MG tablet Take 10 mg by mouth at bedtime as needed for sleep.    Historical Provider, MD    Allergies:   Tape   Social History   Social History  . Marital status: Divorced    Spouse name: N/A  . Number of children: 3  . Years of education: N/A   Occupational History  . student    Social History Main Topics  . Smoking status:  Former Smoker    Packs/day: 0.50    Years: 34.00    Types: E-cigarettes    Quit date: 02/17/2003  . Smokeless tobacco: Former Systems developer     Comment: vapor cigrarette  . Alcohol use No  . Drug use: Yes    Types: Cocaine, Marijuana, Heroin     Comment: 04/25/2013 "abused in the past ; stopped  11/13/2002  . Sexual activity: Not Asked   Other Topics Concern  . None   Social History Narrative   Patient reports he is divorced. 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   On disability otherwise, he is a vapor smoker, 3 caffeinated beverages daily     Family History:  The patient's family history includes Hypertension in his father and mother; Other in his father.   ROS:   Please see the history of present illness.    ROS All other systems reviewed and are negative.   PHYSICAL EXAM:   VS:  BP 124/78   Pulse 77   Ht 5\' 10"  (1.778 m)   Wt 153 lb (69.4 kg)   BMI 21.95 kg/m    GEN: Well nourished, well developed, in no acute distress  HEENT: normal  Neck: no JVD, carotid bruits, or masses Cardiac:RRR; no murmurs, rubs, or gallops,  Trace L LE  edema  Respiratory:  clear to auscultation bilaterally, normal work of breathing GI: soft, nontender, nondistended, + BS MS: no deformity or atrophy  Skin: warm and dry, no rash Neuro:  Alert and Oriented x 3, Strength and sensation are intact Psych: euthymic mood, full affect  Wt Readings from Last 3 Encounters:  07/20/16 153 lb (69.4 kg)  05/12/16 153 lb (69.4 kg)  05/10/16 150 lb (68 kg)      Studies/Labs Reviewed:   EKG:  EKG is not ordered today.    Recent Labs: 10/20/2015: ALT 7 12/28/2015: Platelets 133 05/10/2016: BUN 11; Creatinine, Ser 1.20; Hemoglobin 14.3; Potassium 4.0; Sodium 140   Lipid Panel    Component Value Date/Time   CHOL 174 02/16/2016 1514   TRIG 55 02/16/2016 1514   HDL 80 02/16/2016 1514   CHOLHDL 2.2 02/16/2016 1514   VLDL 11 02/16/2016 1514   LDLCALC 83 02/16/2016 1514    Additional  studies/ records that were reviewed today include:   Echocardiogram: 12/12/14  Study Conclusions  - Left ventricle: The cavity size was normal. Wall thickness was   increased in a pattern of severe LVH. Systolic function was   vigorous. The  estimated ejection fraction was in the range of 65%   to 70%. Doppler parameters are consistent with abnormal left   ventricular relaxation (grade 1 diastolic dysfunction).   Myoview 12/12/14 Study Highlights    Nuclear stress EF: 57%.  The study is normal.  This is a low risk study.  The left ventricular ejection fraction is normal (55-65%).  No evidence of ischemia     ASSESSMENT & PLAN:   1. Lower extremity edema - Resolved with cut back on "potato chips". Noted trace Left lower leg edema. Likely due to PVD. No orthopnea, PND or dyspnea. Advised low sodium diet. Discussed heart failure in details.   2. Chronic diastolic CHF - Not volume overload on exam.   3. HTN - Stable and well controlled  4. HLD - 02/16/2016: Cholesterol 174; HDL 80; LDL Cholesterol 83; Triglycerides 55; VLDL 11  - Continue low dose statin   5. PVD followed by Dr. Bridgett Larsson    Medication Adjustments/Labs and Tests Ordered: Current medicines are reviewed at length with the patient today.  Concerns regarding medicines are outlined above.  Medication changes, Labs and Tests ordered today are listed in the Patient Instructions below. Patient Instructions  Medication Instructions:   Your physician recommends that you continue on your current medications as directed. Please refer to the Current Medication list given to you today.   If you need a refill on your cardiac medications before your next appointment, please call your pharmacy.  Labwork: NONE ORDERED  TODAY    Testing/Procedures: NONE ORDERED  TODAY    Follow-Up: IN 4 TO 6 MONTHS WITH DR ROSS YOU WILL RECEIVE A REMINDER NOTICE IN THE MAIL WHEN CLOSE TIME TO CALL BACK TO MAKE APPOINTMENT     Any Other Special Instructions Will Be Listed Below (If Applicable).                                                                                                                                                      Jarrett Soho, [place]  07/20/2016 8:37 AM    [facility] [address], [place], [place]  [address] Phone: [phone]; Fax: [phone]

## 2016-07-20 ENCOUNTER — Encounter: Payer: Self-pay | Admitting: Physician Assistant

## 2016-07-20 ENCOUNTER — Ambulatory Visit (INDEPENDENT_AMBULATORY_CARE_PROVIDER_SITE_OTHER): Payer: Medicare Other | Admitting: Physician Assistant

## 2016-07-20 VITALS — BP 124/78 | HR 77 | Ht 70.0 in | Wt 153.0 lb

## 2016-07-20 DIAGNOSIS — I739 Peripheral vascular disease, unspecified: Secondary | ICD-10-CM | POA: Diagnosis not present

## 2016-07-20 DIAGNOSIS — E784 Other hyperlipidemia: Secondary | ICD-10-CM

## 2016-07-20 DIAGNOSIS — E7849 Other hyperlipidemia: Secondary | ICD-10-CM

## 2016-07-20 DIAGNOSIS — I10 Essential (primary) hypertension: Secondary | ICD-10-CM | POA: Diagnosis not present

## 2016-07-20 DIAGNOSIS — I5032 Chronic diastolic (congestive) heart failure: Secondary | ICD-10-CM

## 2016-07-20 DIAGNOSIS — R600 Localized edema: Secondary | ICD-10-CM | POA: Diagnosis not present

## 2016-07-20 NOTE — Patient Instructions (Signed)
Medication Instructions:   Your physician recommends that you continue on your current medications as directed. Please refer to the Current Medication list given to you today.   If you need a refill on your cardiac medications before your next appointment, please call your pharmacy.  Labwork: NONE ORDERED  TODAY    Testing/Procedures: NONE ORDERED  TODAY    Follow-Up: IN 4 TO 6 MONTHS WITH DR ROSS YOU WILL RECEIVE A REMINDER NOTICE IN THE MAIL WHEN CLOSE TIME TO CALL BACK TO MAKE APPOINTMENT    Any Other Special Instructions Will Be Listed Below (If Applicable).

## 2016-08-05 ENCOUNTER — Encounter: Payer: Self-pay | Admitting: Vascular Surgery
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
## 2016-08-10 NOTE — Progress Notes (Deleted)
Established Previous Bypass   History of Present Illness  Scott Chavez is a 61 y.o. (01/02/1956) male who presents with chief complaint: ***. Previous operation(s) include:   1. L iliofem EA w/ BPA, L CFA to BK pop BPG with NR ips GSV (04/24/13) 2. PTA+S R CIA and L CIA (10/25/13) 3. OA+PTA R F-P (12/13/13) (ERROR in op note in laterality) 4. R iliofem EA w/ BPA, extended R profundaplasty, R CFA to BK pop BPG w/ NR ips GSV (04/17/15) 5. Excision of thrombosed L RC AVF  The patient's symptoms have ***improved. His intermittent claudicationsx are resolved.  His sx are some aching in his R pelvis. The patient's treatment regimen currently included: maximal medical management and walking.  PMH, PSH, SH, [place] have not changedsince 05/10/16  Current Outpatient Prescriptions  Medication Sig Dispense Refill  . aspirin 325 MG tablet Take 325 mg by mouth 2 (two) times daily.     . Cholecalciferol (VITAMIN D3) 5000 units CAPS Take 5,000 Units by mouth 2 (two) times daily.    . citalopram (CELEXA) 10 MG tablet Take 10 mg by mouth 3 (three) times daily.     . Cyanocobalamin (VITAMIN B-12 PO) Take 1 tablet by mouth daily.    . magnesium oxide (MAG-OX) 400 MG tablet Take 400 mg by mouth 2 (two) times daily.     . mycophenolate (CELLCEPT) 250 MG capsule Take 250-500 mg by mouth See admin instructions. 500 mg in the morning and 250 mg in the evening    . NIFEdipine (PROCARDIA-XL/ADALAT-CC/NIFEDICAL-XL) 30 MG 24 hr tablet Take 30 mg by mouth at bedtime.     Marland Kitchen omeprazole (PRILOSEC) 20 MG capsule Take 20 mg by mouth daily. Reported on 06/06/2015    . pregabalin (LYRICA) 50 MG capsule Take 1 capsule (50 mg total) by mouth 3 (three) times daily. 30 capsule 5  . Pyridoxine HCl (VITAMIN B-6 PO) Take 1 tablet by mouth daily.    . rosuvastatin (CRESTOR) 5 MG tablet Take 5 mg by mouth at bedtime.     . tacrolimus (PROGRAF) 1 MG capsule Take 3 mg by mouth 2 (two) times daily.    . tadalafil  (CIALIS) 20 MG tablet Take 20 mg by mouth daily as needed for erectile dysfunction.    . tamsulosin (FLOMAX) 0.4 MG CAPS capsule Take 0.4 mg by mouth daily.    Marland Kitchen VYVANSE 20 MG capsule Take 20 mg by mouth 2 (two) times daily. In the morning and at Noon  0  . zolpidem (AMBIEN) 10 MG tablet Take 10 mg by mouth at bedtime as needed for sleep.     No current facility-administered medications for this visit.     Physical Examination  There were no vitals filed for this visit.  There is no height or weight on file to calculate BMI.  General: A&O x 3, WD, thin  Pulmonary: Sym exp, good air movt, CTAB, no rales, rhonchi, & wheezing  Cardiac: RRR, Nl S1, S2, no Murmurs, rubs or gallops  Laboratory: CBC:    Component Value Date/Time   WBC 7.8 12/28/2015 1546   RBC 4.71 12/28/2015 1546   HGB 14.3 05/10/2016 1353   HCT 42.0 05/10/2016 1353   PLT 133 (L) 12/28/2015 1546   MCV 87.7 12/28/2015 1546   MCH 29.1 12/28/2015 1546   MCHC 33.2 12/28/2015 1546   RDW 13.4 12/28/2015 1546   LYMPHSABS 0.9 10/01/2014 2029   MONOABS 1.1 (H) 10/01/2014 2029   EOSABS 0.3  10/01/2014 2029   BASOSABS 0.0 10/01/2014 2029    BMP:    Component Value Date/Time   NA 140 05/10/2016 1353   K 4.0 05/10/2016 1353   CL 106 05/10/2016 1353   CO2 22 12/28/2015 1546   GLUCOSE 108 (H) 05/10/2016 1353   BUN 11 05/10/2016 1353   CREATININE 1.20 05/10/2016 1353   CREATININE 1.11 10/20/2015 1355   CALCIUM 9.8 12/28/2015 1546   GFRNONAA 53 (L) 12/28/2015 1546   GFRNONAA 72 10/20/2015 1355   GFRAA >60 12/28/2015 1546   GFRAA 84 10/20/2015 1355    Coagulation: Lab Results  Component Value Date   INR 1.03 04/16/2015   INR 0.92 04/19/2013   No results found for: PTT  Lipids:    Component Value Date/Time   CHOL 174 02/16/2016 1514   TRIG 55 02/16/2016 1514   HDL 80 02/16/2016 1514   CHOLHDL 2.2 02/16/2016 1514   VLDL 11 02/16/2016 1514   LDLCALC 83 02/16/2016 1514    Gastrointestinal: soft,  NTND, no G/R, no HSM, no masses, no CVAT B  Musculoskeletal: M/S 5/5 throughout , Extremities without ischemic changes, all incisions are healed, edema resolved in both legs  Neurologic: Pain and light touch intact in extremities , Motor exam as listed above   Non-Invasive Vascular Imaging  ABI (Date: 08/10/2016)  R:   ABI: *** (***),   DP: {Signals:19197::"none","mono","bi","tri"}  PT: {Signals:19197::"none","mono","bi","tri"}  TBI:  ***  L:   ABI: *** (***),   DP: {Signals:19197::"none","mono","bi","tri"}  PT: {Signals:19197::"none","mono","bi","tri"}  TBI: ***  Aortoiliac Duplex(***)  Ao: *** c/s  R iliac: *** c/s  L iliac: *** c/s  B Bypass Duplex(***)  ***   Medical Decision Making  Scott Chavez is a 61 y.o. (Sep 12, 1955) male who presents with: resolved BLE intermittent claudication, s/p B CIA PTA+S, B fem-pop bypasses with GSV, CKT   Previous angiography demonstrated R EIA stenosis with 3 mm residual lumen.  The L EIA had a 4-5 mm residual lumen.  Based on the patient's vascular studies and examination, I have offered the patient: R iliofemoral endarterectomy and bovine patch angioplasty The patient is currently on a statin: Crestor.  The patient is currently on an anti-platelet: ASA.  Thank you for allowing [place] to participate in this patient's care.   [name], MD, FACS [facility] Office: [phone] Pager: [phone]

## 2016-08-11 ENCOUNTER — Encounter: Payer: Self-pay | Admitting: Family Medicine

## 2016-08-11 ENCOUNTER — Ambulatory Visit (INDEPENDENT_AMBULATORY_CARE_PROVIDER_SITE_OTHER): Payer: Medicare Other | Admitting: Family Medicine

## 2016-08-11 VITALS — BP 128/74 | HR 88 | Resp 16 | Wt 152.0 lb

## 2016-08-11 DIAGNOSIS — M545 Low back pain: Secondary | ICD-10-CM

## 2016-08-11 DIAGNOSIS — G8929 Other chronic pain: Secondary | ICD-10-CM

## 2016-08-11 DIAGNOSIS — M999 Biomechanical lesion, unspecified: Secondary | ICD-10-CM

## 2016-08-11 NOTE — Assessment & Plan Note (Signed)
Decision today to treat with OMT was based on Physical Exam  After verbal consent patient was treated with HVLA, ME, FPR techniques in cervical, thoracic, lumbar and sacral areas  Patient tolerated the procedure well with improvement in symptoms  Patient given exercises, stretches and lifestyle modifications  See medications in patient instructions if given  Patient will follow up in 8 weeks

## 2016-08-11 NOTE — Patient Instructions (Signed)
Good to see you  2 months may be better Continue the exercises 3 times a week.  See me again in 2 months

## 2016-08-11 NOTE — Assessment & Plan Note (Signed)
Patient's doing relatively well. Discussed possible yoga and other core stability exercises that I think will be beneficial. Patient will continue to be active otherwise. Patient follow-up with me again in 2 months. Because patient's tightness and do feel that increased frequency of office visits may be more beneficial. Given therapy band again

## 2016-08-11 NOTE — Progress Notes (Signed)
Pre-visit discussion using our clinic review tool. No additional management support is needed unless otherwise documented below in the visit note.  

## 2016-08-13 ENCOUNTER — Encounter (HOSPITAL_COMMUNITY): Payer: Medicare Other

## 2016-08-13 ENCOUNTER — Ambulatory Visit: Payer: Medicare Other | Admitting: Vascular Surgery

## 2016-08-24 ENCOUNTER — Encounter: Payer: Self-pay | Admitting: Cardiology

## 2016-09-22 ENCOUNTER — Encounter: Payer: Self-pay | Admitting: Vascular Surgery

## 2016-09-24 ENCOUNTER — Ambulatory Visit (INDEPENDENT_AMBULATORY_CARE_PROVIDER_SITE_OTHER)
Admission: RE | Admit: 2016-09-24 | Discharge: 2016-09-24 | Disposition: A | Discharge status: Home / Self Care | Payer: Medicare Other | Source: Ambulatory Visit | Attending: Vascular Surgery | Admitting: Vascular Surgery

## 2016-09-24 ENCOUNTER — Ambulatory Visit (HOSPITAL_COMMUNITY)
Admission: RE | Admit: 2016-09-24 | Discharge: 2016-09-24 | Disposition: A | Discharge status: Home / Self Care | Payer: Medicare Other | Source: Ambulatory Visit | Attending: Vascular Surgery | Admitting: Vascular Surgery

## 2016-09-24 ENCOUNTER — Other Ambulatory Visit: Payer: Self-pay | Admitting: Vascular Surgery

## 2016-09-24 DIAGNOSIS — I739 Peripheral vascular disease, unspecified: Secondary | ICD-10-CM

## 2016-09-24 DIAGNOSIS — Z95820 Peripheral vascular angioplasty status with implants and grafts: Secondary | ICD-10-CM | POA: Insufficient documentation

## 2016-09-24 DIAGNOSIS — Z48812 Encounter for surgical aftercare following surgery on the circulatory system: Secondary | ICD-10-CM
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
## 2016-10-01 ENCOUNTER — Ambulatory Visit (INDEPENDENT_AMBULATORY_CARE_PROVIDER_SITE_OTHER): Payer: Medicare Other | Admitting: Vascular Surgery

## 2016-10-01 ENCOUNTER — Encounter: Payer: Self-pay | Admitting: Vascular Surgery

## 2016-10-01 VITALS — BP 140/84 | HR 73 | Resp 20 | Ht 70.0 in | Wt 152.0 lb

## 2016-10-01 DIAGNOSIS — I70211 Atherosclerosis of native arteries of extremities with intermittent claudication, right leg: Secondary | ICD-10-CM

## 2016-10-02 ENCOUNTER — Ambulatory Visit (INDEPENDENT_AMBULATORY_CARE_PROVIDER_SITE_OTHER): Payer: Medicare Other

## 2016-10-02 ENCOUNTER — Emergency Department (HOSPITAL_COMMUNITY): Payer: Medicare Other

## 2016-10-02 ENCOUNTER — Emergency Department (HOSPITAL_COMMUNITY)
Admission: EM | Admit: 2016-10-02 | Discharge: 2016-10-02 | Disposition: A | Discharge status: Home / Self Care | Payer: Medicare Other | Attending: Emergency Medicine | Admitting: Emergency Medicine

## 2016-10-02 ENCOUNTER — Ambulatory Visit (HOSPITAL_COMMUNITY)
Admission: EM | Admit: 2016-10-02 | Discharge: 2016-10-02 | Disposition: A | Discharge status: Home / Self Care | Payer: Medicare Other | Attending: Internal Medicine | Admitting: Internal Medicine

## 2016-10-02 ENCOUNTER — Encounter (HOSPITAL_COMMUNITY): Payer: Self-pay | Admitting: Emergency Medicine

## 2016-10-02 ENCOUNTER — Encounter (HOSPITAL_COMMUNITY): Payer: Self-pay | Admitting: *Deleted

## 2016-10-02 DIAGNOSIS — Z95828 Presence of other vascular implants and grafts: Secondary | ICD-10-CM | POA: Insufficient documentation

## 2016-10-02 DIAGNOSIS — E7800 Pure hypercholesterolemia, unspecified: Secondary | ICD-10-CM | POA: Insufficient documentation

## 2016-10-02 DIAGNOSIS — S0230XA Fracture of orbital floor, unspecified side, initial encounter for closed fracture: Secondary | ICD-10-CM | POA: Diagnosis not present

## 2016-10-02 DIAGNOSIS — S0282XA Fracture of other specified skull and facial bones, left side, initial encounter for closed fracture: Secondary | ICD-10-CM | POA: Insufficient documentation

## 2016-10-02 DIAGNOSIS — S0993XA Unspecified injury of face, initial encounter: Secondary | ICD-10-CM | POA: Diagnosis not present

## 2016-10-02 DIAGNOSIS — S92502A Displaced unspecified fracture of left lesser toe(s), initial encounter for closed fracture: Secondary | ICD-10-CM

## 2016-10-02 DIAGNOSIS — S0285XA Fracture of orbit, unspecified, initial encounter for closed fracture: Secondary | ICD-10-CM

## 2016-10-02 DIAGNOSIS — S0083XA Contusion of other part of head, initial encounter: Secondary | ICD-10-CM

## 2016-10-02 DIAGNOSIS — Y92003 Bedroom of unspecified non-institutional (private) residence as the place of occurrence of the external cause: Secondary | ICD-10-CM | POA: Insufficient documentation

## 2016-10-02 DIAGNOSIS — S92512A Displaced fracture of proximal phalanx of left lesser toe(s), initial encounter for closed fracture: Secondary | ICD-10-CM | POA: Diagnosis not present

## 2016-10-02 DIAGNOSIS — Z940 Kidney transplant status: Secondary | ICD-10-CM | POA: Insufficient documentation

## 2016-10-02 DIAGNOSIS — I120 Hypertensive chronic kidney disease with stage 5 chronic kidney disease or end stage renal disease: Secondary | ICD-10-CM | POA: Diagnosis not present

## 2016-10-02 DIAGNOSIS — W06XXXA Fall from bed, initial encounter: Secondary | ICD-10-CM | POA: Diagnosis not present

## 2016-10-02 DIAGNOSIS — Y998 Other external cause status: Secondary | ICD-10-CM | POA: Insufficient documentation

## 2016-10-02 DIAGNOSIS — S99922A Unspecified injury of left foot, initial encounter: Secondary | ICD-10-CM | POA: Insufficient documentation

## 2016-10-02 DIAGNOSIS — Z87891 Personal history of nicotine dependence: Secondary | ICD-10-CM | POA: Diagnosis not present

## 2016-10-02 DIAGNOSIS — N186 End stage renal disease: Secondary | ICD-10-CM | POA: Diagnosis not present

## 2016-10-02 DIAGNOSIS — Z7982 Long term (current) use of aspirin: Secondary | ICD-10-CM | POA: Diagnosis not present

## 2016-10-02 DIAGNOSIS — Z79899 Other long term (current) drug therapy: Secondary | ICD-10-CM | POA: Diagnosis not present

## 2016-10-02 DIAGNOSIS — S0280XA Fracture of other specified skull and facial bones, unspecified side, initial encounter for closed fracture: Secondary | ICD-10-CM

## 2016-10-02 DIAGNOSIS — S0292XA Unspecified fracture of facial bones, initial encounter for closed fracture: Secondary | ICD-10-CM | POA: Diagnosis not present

## 2016-10-02 DIAGNOSIS — Y9389 Activity, other specified: Secondary | ICD-10-CM | POA: Insufficient documentation

## 2016-10-02 DIAGNOSIS — R51 Headache: Secondary | ICD-10-CM | POA: Diagnosis not present

## 2016-10-02 MED ORDER — HYDROCODONE-ACETAMINOPHEN 5-325 MG PO TABS: 1.0000 | ORAL_TABLET | Freq: Four times a day (QID) | ORAL | 0 refills | Status: DC | PRN

## 2016-10-02 MED ORDER — HYDROCODONE-ACETAMINOPHEN 5-325 MG PO TABS
1.0000 | ORAL_TABLET | Freq: Once | ORAL | Status: AC
Start: 2016-10-02 — End: 2016-10-02
  Administered 2016-10-02: 1 via ORAL
  Filled 2016-10-02: qty 1

## 2016-10-02 MED ORDER — CEPHALEXIN 500 MG PO CAPS: 500.0000 mg | ORAL_CAPSULE | Freq: Four times a day (QID) | ORAL | 0 refills | Status: DC

## 2016-10-02 MED ORDER — SALINE SPRAY 0.65 % NA SOLN: 1.0000 | NASAL | 0 refills | Status: DC | PRN

## 2016-10-02 NOTE — ED Provider Notes (Signed)
[place] DEPT Provider Note   CSN: [id number] Arrival date & time: 10/02/16  1426  By signing my name below, I, Scott Chavez, attest that this documentation has been prepared under the direction and in the presence of  [name], [place]. Electronically Signed: Evelene Chavez, Scribe. 10/02/2016. 4:11 PM.  History   Chief Complaint Chief Complaint  Patient presents with  . Toe Injury  . Facial Pain    The history is provided by the patient. No language interpreter was used.    HPI Comments:  Scott Chavez is a 61 y.o. male with PMH/o kidney transplant who presents to the Emergency Department complaining of moderate left facial pain with associated pain to the left second toe s/p fall this AM. Pt states he fell out of bed due to a dream he was having. He states he struck his face on the dresser and injured his toe as well. He was seen at urgent care today for his pain and had imaging that showed a "minimally displaced fracture of the distal metaphysis proximal phalanx second toe" and "left zygoma fracture and probable fracture of the left orbital floor". He was advised to come to the ED for facial CT for further evaluation. No alleviating factors noted. He denies taking any medication prior to arrival. No vision changes. No pain to the jaw.   Past Medical History:  Diagnosis Date  . Anemia    pt. not sure, but reports he is taking Fe for one month now.  . Aorto-iliac disease ([place])   . Bladder dysfunction   . ESRD (end stage renal disease) [facility])    transplant- [facility] , followed by Dr. Moshe Cipro   . GERD (gastroesophageal reflux disease)   . Hemodialysis patient [facility])    "on dialysis 1998-2010" 04/25/2013)  . Hepatitis C   . History of renal failure   . Hypercholesteremia   . Hypertension   . Internal hemorrhoids    Colonoscopy 2010 and anoscopy 2015  . Peripheral vascular disease ([place])   . Pneumonia   . Self-catheterizes urinary bladder    pt.  choice, but he reports that he desires to do this to avoid retention     Patient Active Problem List   Diagnosis Date Noted  . Chronic neck pain 03/15/2016  . Chronic low back pain without sciatica 03/15/2016  . Nonallopathic lesion of lumbosacral region 03/15/2016  . Nonallopathic lesion of sacral region 03/15/2016  . Nonallopathic lesion of thoracic region 03/15/2016  . Liver fibrosis 12/18/2015  . Arteriovenous fistula thrombosis ([place]) 10/16/2015  . Chronic hepatitis C without hepatic coma ([place]) 09/03/2015  . Atherosclerosis of native arteries of extremities with intermittent claudication, right leg ([place]) 04/17/2015  . Pain in joint, lower leg 09/13/2014  . Hemorrhoids, internal, with bleeding 01/29/2014  . Pain in limb 08/17/2013  . Peripheral vascular disease, unspecified ([place]) 06/08/2013  . Redness of skin-Calf to foot LEFT 05/25/2013  . Tenderness in limb-Calf to foot LEFT 05/25/2013  . Swelling of limb-Calf to foot LEFT 05/25/2013  . Postoperative pain 05/02/2013  . PAD (peripheral artery disease) ([place]) 04/24/2013  . Preop cardiovascular exam 03/27/2013  . Essential hypertension 03/27/2013  . Hyperlipidemia 03/27/2013  . Atherosclerosis of native arteries of extremity with intermittent claudication ([place]) 02/16/2013  . Transplant recipient 02/16/2013    Past Surgical History:  Procedure Laterality Date  . ABDOMINAL AORTAGRAM N/A 03/09/2013   Procedure: ABDOMINAL Maxcine Ham;  Surgeon: Elam Dutch, MD;  Location: [facility] CATH LAB;  Service: Cardiovascular;  Laterality: N/A;  . AV FISTULA PLACEMENT Left 1998   "removed 2011" (04/25/2013)  . AV FISTULA REPAIR Left    removal with partial vein removed  . COLONOSCOPY    . ENDARTERECTOMY FEMORAL Right 04/17/2015   Procedure: ENDARTERECTOMY RIGHT ILIOFEMORAL WITH PROFUNDOPLASTY;  Surgeon: Conrad West Feliciana, MD;  Location: [place];  Service: Vascular;  Laterality: Right;  . FEMORAL-POPLITEAL BYPASS GRAFT Left 04/24/2013   Procedure:  BYPASS GRAFT COMMON FEMORALARTERY-BELOW KNEE POPLITEAL ARTERY WITH GREATER SAPHENOUS VEIN;  Surgeon: Conrad Bay Lake, MD;  Location: [place];  Service: Vascular;  Laterality: Left;  . FEMORAL-POPLITEAL BYPASS GRAFT Right 04/17/2015   Procedure: BYPASS GRAFT RIGHT FEMORALTO BELOW KNEE POPLITEAL ARTERY USING RIGHT NON REVERSED GREATER SAPPHENOUS VEIN;  Surgeon: Conrad Dollar Bay, MD;  Location: [place];  Service: Vascular;  Laterality: Right;  . INTRAOPERATIVE ARTERIOGRAM Left 04/24/2013   Procedure: INTRA OPERATIVE ARTERIOGRAM;  Surgeon: Conrad Liberty, MD;  Location: [place];  Service: Vascular;  Laterality: Left;  . KIDNEY TRANSPLANT    . LOWER EXTREMITY ANGIOGRAM Bilateral 03/09/2013   Procedure: LOWER EXTREMITY ANGIOGRAM;  Surgeon: Elam Dutch, MD;  Location: [facility] CATH LAB;  Service: Cardiovascular;  Laterality: Bilateral;  . LOWER EXTREMITY ANGIOGRAM Right 10/25/2013   Procedure: LOWER EXTREMITY ANGIOGRAM;  Surgeon: Conrad White Pine, MD;  Location: [facility] CATH LAB;  Service: Cardiovascular;  Laterality: Right;  . LOWER EXTREMITY ANGIOGRAM Right 12/13/2013   Procedure: LOWER EXTREMITY ANGIOGRAM;  Surgeon: Conrad Gibson, MD;  Location: [facility] CATH LAB;  Service: Cardiovascular;  Laterality: Right;  . NEPHRECTOMY RECIPIENT  2010  . PATCH ANGIOPLASTY Right 04/17/2015   Procedure: Rueben Bash PATCH ANGIOPLASTY, RIGHT ILEOFEMORAL;  Surgeon: Conrad Oglethorpe, MD;  Location: [place];  Service: Vascular;  Laterality: Right;  . PERIPHERAL VASCULAR CATHETERIZATION N/A 04/10/2015   Procedure: Abdominal Aortogram;  Surgeon: Conrad McLouth, MD;  Location: [place] CV LAB;  Service: Cardiovascular;  Laterality: N/A;  . PERIPHERAL VASCULAR CATHETERIZATION N/A 05/10/2016   Procedure: Abdominal Aortogram w/Lower Extremity;  Surgeon: Conrad Radisson, MD;  Location: [place] CV LAB;  Service: Cardiovascular;  Laterality: N/A;  . s/p cadaveric renal transplant  December 2010   [place] Left 09/24/2015   Procedure:  EXCISION OF THROMBOSED RADIOCEPHALIC ARTERIOVENOUS FISTULA;  Surgeon: Conrad Gurley, MD;  Location: [place];  Service: Vascular;  Laterality: Left;  Marland Kitchen VEIN HARVEST Right 04/17/2015   Procedure: RIGHT GREATER SAPPHENOUS VEIN HARVEST ;  Surgeon: Conrad , MD;  Location: [place];  Service: Vascular;  Laterality: Right;       Home Medications    Prior to Admission medications   Medication Sig Start Date End Date Taking? Authorizing Provider  aspirin 325 MG tablet Take 325 mg by mouth 2 (two) times daily.     [provider]  Cholecalciferol (VITAMIN D3) 5000 units CAPS Take 5,000 Units by mouth 2 (two) times daily.    [provider]  citalopram (CELEXA) 10 MG tablet Take 10 mg by mouth 3 (three) times daily.  09/18/15   [provider]  Cyanocobalamin (VITAMIN B-12 PO) Take 1 tablet by mouth daily.    [provider]  magnesium oxide (MAG-OX) 400 MG tablet Take 400 mg by mouth 2 (two) times daily.     [provider]  mycophenolate (CELLCEPT) 250 MG capsule Take 250-500 mg by mouth See admin instructions. 500 mg in the morning and 250 mg in the evening  [provider]  NIFEdipine (PROCARDIA-XL/ADALAT-CC/NIFEDICAL-XL) 30 MG 24 hr tablet Take 30 mg by mouth at bedtime.     [provider]  omeprazole (PRILOSEC) 20 MG capsule Take 20 mg by mouth daily. Reported on 06/06/2015    [provider]  pregabalin (LYRICA) 50 MG capsule Take 1 capsule (50 mg total) by mouth 3 (three) times daily. 01/08/16   Conrad Midway, MD  Pyridoxine HCl (VITAMIN B-6 PO) Take 1 tablet by mouth daily.    [provider]  rosuvastatin (CRESTOR) 5 MG tablet Take 5 mg by mouth at bedtime.     [provider]  tacrolimus (PROGRAF) 1 MG capsule Take 3 mg by mouth 2 (two) times daily.    [provider]  tadalafil (CIALIS) 20 MG tablet Take 20 mg by mouth daily as needed for erectile dysfunction.    [provider]    tamsulosin (FLOMAX) 0.4 MG CAPS capsule Take 0.4 mg by mouth daily.    [provider]  VYVANSE 20 MG capsule Take 20 mg by mouth 2 (two) times daily. In the morning and at [facility] 08/25/15   [provider]  zolpidem (AMBIEN) 10 MG tablet Take 10 mg by mouth at bedtime as needed for sleep.    [provider]    Family History Family History  Problem Relation Age of Onset  . Hypertension Father   . Other Father        amputation  . Hypertension Mother     Social History Social History  Substance Use Topics  . Smoking status: Former Smoker    Packs/day: 0.50    Years: 34.00    Types: E-cigarettes    Quit date: 02/17/2003  . Smokeless tobacco: Former Systems developer     Comment: vapor cigrarette  . Alcohol use No     Allergies   Tape   Review of Systems Review of Systems  HENT: Positive for facial swelling.        +facial pain   Musculoskeletal: Positive for arthralgias and myalgias.  Skin: Positive for color change.  Neurological: Negative for syncope.     Physical Exam Updated Vital Signs BP 114/86 (BP Location: Right Arm)   Pulse 88   Temp 98.1 F (36.7 C) (Oral)   Resp 17   Ht 5\' 10"  (1.778 m)   Wt 152 lb (68.9 kg)   SpO2 100%   BMI 21.81 kg/m   Physical Exam  Constitutional: He appears well-developed and well-nourished.  Sitting comfortably on examination table  HENT:  Head: Normocephalic and atraumatic.  Diffuse ecchymosis and edema to the left periorbital region TTP to the left periorbital region. Crepitus noted to the lower orbital/zygomatic arch.   Eyes: Conjunctivae and EOM are normal. Pupils are equal, round, and reactive to light. Right eye exhibits no discharge. Left eye exhibits no discharge. No scleral icterus.  EOMs intact without difficulty. No evidence of entrapment. No nystagmus noted.   Pulmonary/Chest: Effort normal.  Neurological: He is alert.  Skin: Skin is warm and dry.  Psychiatric: He has a normal mood and  affect. His speech is normal and behavior is normal.  Nursing note and vitals reviewed.    ED Treatments / Results  DIAGNOSTIC STUDIES:  Oxygen Saturation is 100% on RA, normal by my interpretation.    COORDINATION OF CARE:  4:07 PM Discussed treatment plan with pt at bedside and pt agreed to plan.  Labs (all labs ordered are listed, but only abnormal results  are displayed) Labs Reviewed - No data to display  EKG  EKG Interpretation None       Radiology Dg Facial Bones Complete  Result Date: 10/02/2016 CLINICAL DATA:  Fall with left facial injury and pain today. Initial encounter. EXAM: FACIAL BONES COMPLETE 3+V COMPARISON:  None. FINDINGS: A fracture of the left zygoma is present. There appears to be a left orbital floor fracture. Opacification of the left maxillary sinus is noted. IMPRESSION: Left zygoma fracture and probable fracture of the left orbital floor. Opacified left maxillary sinus. Facial CT recommended for further evaluation. Electronically Signed   By: Margarette Canada M.D.   On: 10/02/2016 13:51   Dg Foot Complete Left  Result Date: 10/02/2016 CLINICAL DATA:  Blow to the left foot on a dresser this morning with onset of pain. Initial encounter. EXAM: LEFT FOOT - COMPLETE 3+ VIEW COMPARISON:  None. FINDINGS: The patient has a fracture through the distal metaphysis of the proximal phalanx of the second toe with slight lateral displacement of the distal fragment. No other acute bony or joint abnormality is identified. Hallux valgus and mild first MTP osteoarthritis noted. Soft tissues appear normal. IMPRESSION: Min displaced fracture distal metaphysis proximal phalanx second toe. Hallux valgus and first MTP osteoarthritis. Electronically Signed   By: Inge Rise M.D.   On: 10/02/2016 13:47    Procedures Procedures (including critical care time)  Medications Ordered in ED Medications - No data to display   Initial Impression / Assessment and Plan / ED Course  I  have reviewed the triage vital signs and the nursing notes.  Pertinent labs & imaging results that were available during my care of the patient were reviewed by me and considered in my medical decision making (see chart for details).     61year-old male who presents from urgent care with facial injury and known toe fracture. Patient was seen at urgent care and had x-rays that showed confirmation of zygomatic bone and possible orbital wall fracture. He was sent to the emergency department for further CT evaluation. EOMs intact without difficulty. No vision symptoms. Low suspicion for entrapment. Will order a CT to evaluate for wall fracture. Analgesics provided in the department.  Patient signed out to oncoming PA at shift change with CT orbit pending. If shown orbital wall fracture, plan to send home with ENT referral and pain management.  Final Clinical Impressions(s) / ED Diagnoses   Final diagnoses:  Contusion of face, initial encounter  Closed fracture of facial bone, unspecified facial bone, initial encounter ([place])  Closed fracture of orbital wall, initial encounter [facility])    New Prescriptions New Prescriptions   No medications on file   I personally performed the services described in this documentation, which was scribed in my presence. The recorded information has been reviewed and is accurate.     Volanda Napoleon, PA-C 10/03/16 1287    Malvin Johns, MD 10/03/16 [phone]

## 2016-10-02 NOTE — ED Triage Notes (Signed)
Pt. Sent from UC for reevaluation of left 2nd toe, pt. [place] out of bed this morning dreaming.

## 2016-10-02 NOTE — ED Notes (Signed)
Pt left room to smoke. Pt informed that they need to be in room at time of CT arrival.

## 2016-10-02 NOTE — ED Provider Notes (Signed)
THIS IS A SHARED VISIT WITH Scott Chavez, PAC.   Scott Chavez is a 61 y.o. male sent to the ED from [facility] for evaluation of facial fractures. Patient awaiting CT when patient turned over to me.  BP (!) 163/78 (BP Location: Right Arm)   Pulse 61   Temp 98.1 F (36.7 C) (Oral)   Resp 18   Ht 5\' 10"  (1.778 m)   Wt 68.9 kg (152 lb)   SpO2 100%   BMI 21.81 kg/m    Dg Facial Bones Complete  Result Date: 10/02/2016 CLINICAL DATA:  Fall with left facial injury and pain today. Initial encounter. EXAM: FACIAL BONES COMPLETE 3+V COMPARISON:  None. FINDINGS: A fracture of the left zygoma is present. There appears to be a left orbital floor fracture. Opacification of the left maxillary sinus is noted. IMPRESSION: Left zygoma fracture and probable fracture of the left orbital floor. Opacified left maxillary sinus. Facial CT recommended for further evaluation. Electronically Signed   By: Scott Chavez M.D.   On: 10/02/2016 13:51   Dg Foot Complete Left  Result Date: 10/02/2016 CLINICAL DATA:  Blow to the left foot on a dresser this morning with onset of pain. Initial encounter. EXAM: LEFT FOOT - COMPLETE 3+ VIEW COMPARISON:  None. FINDINGS: The patient has a fracture through the distal metaphysis of the proximal phalanx of the second toe with slight lateral displacement of the distal fragment. No other acute bony or joint abnormality is identified. Hallux valgus and mild first MTP osteoarthritis noted. Soft tissues appear normal. IMPRESSION: Min displaced fracture distal metaphysis proximal phalanx second toe. Hallux valgus and first MTP osteoarthritis. Electronically Signed   By: Scott Chavez M.D.   On: 10/02/2016 13:47   Ct Orbits Wo Contrast  Result Date: 10/02/2016 CLINICAL DATA:  Fall, facial injury EXAM: CT ORBITS WITHOUT CONTRAST TECHNIQUE: Multidetector CT images were obtained using the standard protocol without intravenous contrast. COMPARISON:  Plain films performed earlier today FINDINGS:  Orbits: There is a fracture through the floor of the left orbit. Fracture extends to involve the anterior and posterior walls of the left maxillary sinus and the left zygomatic arch. Fracture through the lateral wall of the left orbit. No evidence of entrapment. Visualized sinuses: Blood noted within the left maxillary sinus. Paranasal sinuses otherwise clear. Mastoids clear. Soft tissues: Soft tissue swelling over the left side of the face. Limited intracranial: No significant or unexpected finding. IMPRESSION: Left orbital/facial fractures involving the lateral and for of the left orbit, anterior and posterior walls of the left maxillary sinus, and left zygomatic arch. Blood within the left maxillary sinus. Electronically Signed   By: Scott Chavez M.D.   On: 10/02/2016 19:13    Results of CT scan discussed with the patient.   Consult with Dr. Wilburn Chavez on call for ENT and he will see the patient for f/u in the office in one week. Patient to call on Monday to schedule.  Patient instructed on soft diet, saline nasal spray, antibiotics, ice and follow up. Will also treat for pain. Patient voices understanding and agrees with plan. Return precautions discussed. Patient stable for d/c at this time.  Rx Keflex   Scott Chavez, [place] 10/02/16 2059    Scott Fast, MD 10/03/16 [phone]

## 2016-10-02 NOTE — ED Triage Notes (Signed)
Reports being chased by cat, causing pt to run into a dresser this AM @ 0800.  C/O left periorbital ecchymosis, pain, swelling.  Also c/o left 2nd toe pain from dresser.  Denies LOC or blurred vision.

## 2016-10-02 NOTE — Discharge Instructions (Addendum)
Follow-up with the referred ENT doctor for further evaluation. Call their office on Monday and arrange for an appointment in the next 7 days  DO NOT BLOW YOUR NOSE, USE THE NASAL SPRAY FOR SWELLING, EAT ONLY SOFT FOOD UNTIL FOLLOW UP WITH DR. Wilburn Cornelia.   Take medication as directed for pain.   Follow-up with your primary care doctor as needed for further evaluation.  Return to the Emergency Dept for any worsening pain, fever, vision changes, eye pain or any other worsening or concerning symptoms.

## 2016-10-02 NOTE — Discharge Instructions (Signed)
Please go straight to Emergency Room You need higher level of care and CT of left orbit

## 2016-10-02 NOTE — ED Provider Notes (Signed)
CSN: [id number]     Arrival date & time 10/02/16  1209 History   None    Chief Complaint  Patient presents with  . Eye Injury   (Consider location/radiation/quality/duration/timing/severity/associated sxs/prior Treatment) Patient had a bad dream and thought he was being chased by a cat and he got up out of bed and tripped and fell and hit his left face/ eye into dresser.  He also injured his right second toe.   The history is provided by the patient. No language interpreter was used.  Head Injury  Location:  Generalized Time since incident:  5 hours Mechanism of injury: fall   Fall:    Fall occurred:  Running   Height of fall:  3   Impact surface:  Unable to specify   Entrapped after fall: no   Pain details:    Quality:  Aching   Severity:  Moderate   Timing:  Constant   Past Medical History:  Diagnosis Date  . Anemia    pt. not sure, but reports he is taking Fe for one month now.  . Aorto-iliac disease ([place])   . Bladder dysfunction   . ESRD (end stage renal disease) [facility])    [facility]- [facility] , followed by Dr. Moshe Cipro   . GERD (gastroesophageal reflux disease)   . Hemodialysis patient [facility])    "on dialysis 1998-2010" 04/25/2013)  . Hepatitis C   . History of renal failure   . Hypercholesteremia   . Hypertension   . Internal hemorrhoids    Colonoscopy 2010 and anoscopy 2015  . Peripheral vascular disease ([place])   . Pneumonia   . Self-catheterizes urinary bladder    pt. choice, but he reports that he desires to do this to avoid retention    Past Surgical History:  Procedure Laterality Date  . ABDOMINAL AORTAGRAM N/A 03/09/2013   Procedure: ABDOMINAL Maxcine Ham;  Surgeon: Elam Dutch, MD;  Location: [facility] CATH LAB;  Service: Cardiovascular;  Laterality: N/A;  . AV FISTULA PLACEMENT Left 1998   "removed 2011" (04/25/2013)  . AV FISTULA REPAIR Left    removal with partial vein removed  . COLONOSCOPY    . ENDARTERECTOMY FEMORAL Right  04/17/2015   Procedure: ENDARTERECTOMY RIGHT ILIOFEMORAL WITH PROFUNDOPLASTY;  Surgeon: Conrad Kotzebue, MD;  Location: [place];  Service: Vascular;  Laterality: Right;  . FEMORAL-POPLITEAL BYPASS GRAFT Left 04/24/2013   Procedure: BYPASS GRAFT COMMON FEMORALARTERY-BELOW KNEE POPLITEAL ARTERY WITH GREATER SAPHENOUS VEIN;  Surgeon: Conrad Seville, MD;  Location: [place];  Service: Vascular;  Laterality: Left;  . FEMORAL-POPLITEAL BYPASS GRAFT Right 04/17/2015   Procedure: BYPASS GRAFT RIGHT FEMORALTO BELOW KNEE POPLITEAL ARTERY USING RIGHT NON REVERSED GREATER SAPPHENOUS VEIN;  Surgeon: Conrad Parkway, MD;  Location: [place];  Service: Vascular;  Laterality: Right;  . INTRAOPERATIVE ARTERIOGRAM Left 04/24/2013   Procedure: INTRA OPERATIVE ARTERIOGRAM;  Surgeon: Conrad Reeds, MD;  Location: [place];  Service: Vascular;  Laterality: Left;  . KIDNEY TRANSPLANT    . LOWER EXTREMITY ANGIOGRAM Bilateral 03/09/2013   Procedure: LOWER EXTREMITY ANGIOGRAM;  Surgeon: Elam Dutch, MD;  Location: [facility] CATH LAB;  Service: Cardiovascular;  Laterality: Bilateral;  . LOWER EXTREMITY ANGIOGRAM Right 10/25/2013   Procedure: LOWER EXTREMITY ANGIOGRAM;  Surgeon: Conrad Stryker, MD;  Location: [facility] CATH LAB;  Service: Cardiovascular;  Laterality: Right;  . LOWER EXTREMITY ANGIOGRAM Right 12/13/2013   Procedure: LOWER EXTREMITY ANGIOGRAM;  Surgeon: Conrad Otsego, MD;  Location: [facility] CATH LAB;  Service:  Cardiovascular;  Laterality: Right;  . NEPHRECTOMY RECIPIENT  2010  . PATCH ANGIOPLASTY Right 04/17/2015   Procedure: Rueben Bash PATCH ANGIOPLASTY, RIGHT ILEOFEMORAL;  Surgeon: Conrad Warsaw, MD;  Location: [place];  Service: Vascular;  Laterality: Right;  . PERIPHERAL VASCULAR CATHETERIZATION N/A 04/10/2015   Procedure: Abdominal Aortogram;  Surgeon: Conrad Flower Mound, MD;  Location: [place] CV LAB;  Service: Cardiovascular;  Laterality: N/A;  . PERIPHERAL VASCULAR CATHETERIZATION N/A 05/10/2016   Procedure: Abdominal Aortogram w/Lower Extremity;   Surgeon: Conrad Leary, MD;  Location: [place] CV LAB;  Service: Cardiovascular;  Laterality: N/A;  . s/p cadaveric renal transplant  December 2010   [place] Left 09/24/2015   Procedure: EXCISION OF THROMBOSED RADIOCEPHALIC ARTERIOVENOUS FISTULA;  Surgeon: Conrad Joplin, MD;  Location: [place];  Service: Vascular;  Laterality: Left;  Marland Kitchen VEIN HARVEST Right 04/17/2015   Procedure: RIGHT GREATER [place] ;  Surgeon: Conrad Warba, MD;  Location: [place];  Service: Vascular;  Laterality: Right;   Family History  Problem Relation Age of Onset  . Hypertension Father   . Other Father        amputation  . Hypertension Mother    Social History  Substance Use Topics  . Smoking status: Former Smoker    Packs/day: 0.50    Years: 34.00    Types: E-cigarettes    Quit date: 02/17/2003  . Smokeless tobacco: Former Systems developer     Comment: vapor cigrarette  . Alcohol use No    Review of Systems  Constitutional: Negative.   HENT: Negative.   Eyes:       Left periorbital swelling  Cardiovascular: Negative.   Gastrointestinal: Negative.   Genitourinary: Negative.   Musculoskeletal: Positive for arthralgias.       Sore and swollen second left toe  Allergic/Immunologic: Negative.     Allergies  Tape  Home Medications   Prior to Admission medications   Medication Sig Start Date End Date Taking? Authorizing Provider  aspirin 325 MG tablet Take 325 mg by mouth 2 (two) times daily.    Yes [provider]  Cholecalciferol (VITAMIN D3) 5000 units CAPS Take 5,000 Units by mouth 2 (two) times daily.   Yes [provider]  citalopram (CELEXA) 10 MG tablet Take 10 mg by mouth 3 (three) times daily.  09/18/15  Yes [provider]  Cyanocobalamin (VITAMIN B-12 PO) Take 1 tablet by mouth daily.   Yes [provider]  magnesium oxide (MAG-OX) 400 MG tablet Take 400 mg by mouth 2 (two) times daily.    Yes [provider]   mycophenolate (CELLCEPT) 250 MG capsule Take 250-500 mg by mouth See admin instructions. 500 mg in the morning and 250 mg in the evening   Yes [provider]  omeprazole (PRILOSEC) 20 MG capsule Take 20 mg by mouth daily. Reported on 06/06/2015   Yes [provider]  pregabalin (LYRICA) 50 MG capsule Take 1 capsule (50 mg total) by mouth 3 (three) times daily. 01/08/16  Yes Conrad , MD  Pyridoxine HCl (VITAMIN B-6 PO) Take 1 tablet by mouth daily.   Yes [provider]  rosuvastatin (CRESTOR) 5 MG tablet Take 5 mg by mouth at bedtime.    Yes [provider]  tacrolimus (PROGRAF) 1 MG capsule Take 3 mg by mouth 2 (two) times daily.   Yes [provider]  tadalafil (CIALIS) 20 MG tablet Take 20 mg by  mouth daily as needed for erectile dysfunction.   Yes [provider]  tamsulosin (FLOMAX) 0.4 MG CAPS capsule Take 0.4 mg by mouth daily.   Yes [provider]  VYVANSE 20 MG capsule Take 20 mg by mouth 2 (two) times daily. In the morning and at [facility] 08/25/15  Yes [provider]  zolpidem (AMBIEN) 10 MG tablet Take 10 mg by mouth at bedtime as needed for sleep.   Yes [provider]  NIFEdipine (PROCARDIA-XL/ADALAT-CC/NIFEDICAL-XL) 30 MG 24 hr tablet Take 30 mg by mouth at bedtime.     [provider]   Meds Ordered and Administered this Visit  Medications - No data to display  BP (!) 141/79   Pulse 78   Temp 98.2 F (36.8 C) (Oral)   Resp 16   SpO2 100%  No data found.   Physical Exam  Constitutional: He appears well-developed and well-nourished.  HENT:  Head: Normocephalic and atraumatic.  Right Ear: External ear normal.  Left Ear: External ear normal.  Mouth/Throat: Oropharynx is clear and moist.  Left peri-orbital swelling and left maxillary tenderness  Eyes: Conjunctivae and EOM are normal. Pupils are equal, round, and reactive to light.  Neck: Normal range of motion. Neck supple.   Cardiovascular: Normal rate, regular rhythm and normal heart sounds.   Pulmonary/Chest: Effort normal and breath sounds normal.  Abdominal: Soft. Bowel sounds are normal.  Musculoskeletal: Normal range of motion. He exhibits tenderness.  Nursing note and vitals reviewed.   Urgent Care Course     Procedures (including critical care time)  Labs Review Labs Reviewed - No data to display  Imaging Review Dg Facial Bones Complete  Result Date: 10/02/2016 CLINICAL DATA:  Fall with left facial injury and pain today. Initial encounter. EXAM: FACIAL BONES COMPLETE 3+V COMPARISON:  None. FINDINGS: A fracture of the left zygoma is present. There appears to be a left orbital floor fracture. Opacification of the left maxillary sinus is noted. IMPRESSION: Left zygoma fracture and probable fracture of the left orbital floor. Opacified left maxillary sinus. Facial CT recommended for further evaluation. Electronically Signed   By: Margarette Canada M.D.   On: 10/02/2016 13:51   Dg Foot Complete Left  Result Date: 10/02/2016 CLINICAL DATA:  Blow to the left foot on a dresser this morning with onset of pain. Initial encounter. EXAM: LEFT FOOT - COMPLETE 3+ VIEW COMPARISON:  None. FINDINGS: The patient has a fracture through the distal metaphysis of the proximal phalanx of the second toe with slight lateral displacement of the distal fragment. No other acute bony or joint abnormality is identified. Hallux valgus and mild first MTP osteoarthritis noted. Soft tissues appear normal. IMPRESSION: Min displaced fracture distal metaphysis proximal phalanx second toe. Hallux valgus and first MTP osteoarthritis. Electronically Signed   By: Inge Rise M.D.   On: 10/02/2016 13:47     Visual Acuity Review  Right Eye Distance: 20/20 Left Eye Distance: 20/25 Bilateral Distance: 20/20  Right Eye Near:   Left Eye Near:    Bilateral Near:         MDM   1. Fracture of other specified skull and facial bones, left  side, initial encounter for closed fracture ([place])   2. Closed fracture of phalanx of left second toe, initial encounter    Need to go to ED for higher level of care.      Lysbeth Penner, FNP 10/02/16 1455

## 2016-10-08 DIAGNOSIS — S022XXA Fracture of nasal bones, initial encounter for closed fracture: Secondary | ICD-10-CM | POA: Diagnosis not present

## 2016-10-11 DIAGNOSIS — S0240BD Malar fracture, left side, subsequent encounter for fracture with routine healing: Secondary | ICD-10-CM | POA: Diagnosis not present

## 2016-10-11 DIAGNOSIS — M25572 Pain in left ankle and joints of left foot: Secondary | ICD-10-CM | POA: Diagnosis not present

## 2016-10-11 DIAGNOSIS — M79672 Pain in left foot: Secondary | ICD-10-CM | POA: Diagnosis not present

## 2016-10-11 DIAGNOSIS — S0240BA Malar fracture, left side, initial encounter for closed fracture: Secondary | ICD-10-CM | POA: Insufficient documentation

## 2016-10-11 DIAGNOSIS — S0282XD Fracture of other specified skull and facial bones, left side, subsequent encounter for fracture with routine healing: Secondary | ICD-10-CM | POA: Diagnosis not present

## 2016-10-11 DIAGNOSIS — K921 Melena: Secondary | ICD-10-CM | POA: Diagnosis not present

## 2016-10-11 DIAGNOSIS — E785 Hyperlipidemia, unspecified: Secondary | ICD-10-CM | POA: Diagnosis not present

## 2016-10-11 DIAGNOSIS — I10 Essential (primary) hypertension: Secondary | ICD-10-CM | POA: Diagnosis not present

## 2016-10-14 ENCOUNTER — Other Ambulatory Visit: Payer: Self-pay

## 2016-10-15 DIAGNOSIS — S0240DA Maxillary fracture, left side, initial encounter for closed fracture: Secondary | ICD-10-CM | POA: Diagnosis not present

## 2016-10-15 DIAGNOSIS — R339 Retention of urine, unspecified: Secondary | ICD-10-CM | POA: Diagnosis not present

## 2016-10-15 DIAGNOSIS — S0240BA Malar fracture, left side, initial encounter for closed fracture: Secondary | ICD-10-CM | POA: Diagnosis not present

## 2016-10-22 ENCOUNTER — Encounter (HOSPITAL_COMMUNITY)
Admission: RE | Admit: 2016-10-22 | Discharge: 2016-10-22 | Disposition: A | Discharge status: Home / Self Care | Payer: Medicare Other | Source: Ambulatory Visit | Attending: Vascular Surgery | Admitting: Vascular Surgery

## 2016-10-22 ENCOUNTER — Encounter (HOSPITAL_COMMUNITY): Payer: Self-pay

## 2016-10-22 DIAGNOSIS — E7800 Pure hypercholesterolemia, unspecified: Secondary | ICD-10-CM | POA: Diagnosis not present

## 2016-10-22 DIAGNOSIS — Z7982 Long term (current) use of aspirin: Secondary | ICD-10-CM | POA: Diagnosis not present

## 2016-10-22 DIAGNOSIS — Z79899 Other long term (current) drug therapy: Secondary | ICD-10-CM | POA: Diagnosis not present

## 2016-10-22 DIAGNOSIS — Z91048 Other nonmedicinal substance allergy status: Secondary | ICD-10-CM | POA: Diagnosis not present

## 2016-10-22 DIAGNOSIS — I708 Atherosclerosis of other arteries: Secondary | ICD-10-CM | POA: Diagnosis not present

## 2016-10-22 DIAGNOSIS — K219 Gastro-esophageal reflux disease without esophagitis: Secondary | ICD-10-CM | POA: Diagnosis not present

## 2016-10-22 DIAGNOSIS — I10 Essential (primary) hypertension: Secondary | ICD-10-CM | POA: Diagnosis not present

## 2016-10-22 DIAGNOSIS — K5730 Diverticulosis of large intestine without perforation or abscess without bleeding: Secondary | ICD-10-CM | POA: Diagnosis not present

## 2016-10-22 DIAGNOSIS — D509 Iron deficiency anemia, unspecified: Secondary | ICD-10-CM | POA: Diagnosis not present

## 2016-10-22 DIAGNOSIS — K3189 Other diseases of stomach and duodenum: Secondary | ICD-10-CM | POA: Diagnosis not present

## 2016-10-22 DIAGNOSIS — Z8249 Family history of ischemic heart disease and other diseases of the circulatory system: Secondary | ICD-10-CM | POA: Diagnosis not present

## 2016-10-22 DIAGNOSIS — Z940 Kidney transplant status: Secondary | ICD-10-CM | POA: Diagnosis not present

## 2016-10-22 DIAGNOSIS — D126 Benign neoplasm of colon, unspecified: Secondary | ICD-10-CM | POA: Diagnosis not present

## 2016-10-22 DIAGNOSIS — K648 Other hemorrhoids: Secondary | ICD-10-CM | POA: Diagnosis not present

## 2016-10-22 DIAGNOSIS — K2930 Chronic superficial gastritis without bleeding: Secondary | ICD-10-CM | POA: Diagnosis not present

## 2016-10-22 HISTORY — DX: Attention-deficit hyperactivity disorder, unspecified type: F90.9

## 2016-10-22 LAB — CBC
HEMATOCRIT: 40.2 % (ref 39.0–52.0)
HEMOGLOBIN: 12.4 g/dL — AB (ref 13.0–17.0)
MCH: 26 pg (ref 26.0–34.0)
MCHC: 30.8 g/dL (ref 30.0–36.0)
MCV: 84.3 fL (ref 78.0–100.0)
Platelets: 203 10*3/uL (ref 150–400)
RBC: 4.77 MIL/uL (ref 4.22–5.81)
RDW: 15.6 % — AB (ref 11.5–15.5)
WBC: 4.5 10*3/uL (ref 4.0–10.5)

## 2016-10-22 LAB — TYPE AND SCREEN
ABO/RH(D): O POS
Antibody Screen: NEGATIVE

## 2016-10-22 LAB — COMPREHENSIVE METABOLIC PANEL
ALK PHOS: 47 U/L (ref 38–126)
ALT: 14 U/L — ABNORMAL LOW (ref 17–63)
ANION GAP: 6 (ref 5–15)
AST: 27 U/L (ref 15–41)
Albumin: 3.8 g/dL (ref 3.5–5.0)
BILIRUBIN TOTAL: 0.6 mg/dL (ref 0.3–1.2)
BUN: 13 mg/dL (ref 6–20)
CALCIUM: 9.8 mg/dL (ref 8.9–10.3)
CO2: 26 mmol/L (ref 22–32)
Chloride: 106 mmol/L (ref 101–111)
Creatinine, Ser: 1.17 mg/dL (ref 0.61–1.24)
GFR calc non Af Amer: >60 mL/min (ref >60)
Glucose, Bld: 91 mg/dL (ref 65–99)
POTASSIUM: 4 mmol/L (ref 3.5–5.1)
SODIUM: 138 mmol/L (ref 135–145)
TOTAL PROTEIN: 7.3 g/dL (ref 6.5–8.1)

## 2016-10-22 LAB — PROTIME-INR
INR: 1.03
Prothrombin Time: 13.5 seconds (ref 11.4–15.2)

## 2016-10-22 LAB — SURGICAL PCR SCREEN
MRSA, PCR: NEGATIVE
Staphylococcus aureus: NEGATIVE

## 2016-10-22 LAB — APTT: aPTT: 32 seconds (ref 24–36)

## 2016-10-22 NOTE — Progress Notes (Signed)
Pt denies cardiac history, chest pain or sob. Pt states he is not diabetic. 

## 2016-10-22 NOTE — Pre-Procedure Instructions (Signed)
Scott Chavez  10/22/2016    Your procedure is scheduled on Monday, October 25, 2016 at 7:30 AM.   Report to [facility] Entrance "A" Admitting Office at 5:30 AM.   Call this number if you have problems the morning of surgery: [phone]   Remember:  Do not eat food or drink liquids after midnight Sunday, 10/24/16.  Take these medicines the morning of surgery with A SIP OF WATER: Aspirin, Citalopram (Celexa), Mycophenolate (Cellcept), Omeprazole (Prilosec), Pregabalin (Lyrica), Tacrolimus (Prograf), Tamsulosin (Flomax)  Stop Multivitamins today.    Do not wear jewelry.  Do not wear lotions, powders, cologne or deodorant.  Men may shave face and neck.  Do not bring valuables to the hospital.  [facility] is not responsible for any belongings or valuables.  Contacts, dentures or bridgework may not be worn into surgery.  Leave your suitcase in the car.  After surgery it may be brought to your room.  For patients admitted to the hospital, discharge time will be determined by your treatment team.  Special instructions: [place] - Preparing for Surgery  Before surgery, you can play an important role.  Because skin is not sterile, your skin needs to be as free of germs as possible.  You can reduce the number of germs on you skin by washing with CHG (chlorahexidine gluconate) soap before surgery.  CHG is an antiseptic cleaner which kills germs and bonds with the skin to continue killing germs even after washing.  Please DO NOT use if you have an allergy to CHG or antibacterial soaps.  If your skin becomes reddened/irritated stop using the CHG and inform your nurse when you arrive at Short Stay.  Do not shave (including legs and underarms) for at least 48 hours prior to the first CHG shower.  You may shave your face.  Please follow these instructions carefully:   1.  Shower with CHG Soap the night before surgery and the                    morning of Surgery.  2.  If you  choose to wash your hair, wash your hair first as usual with your       normal shampoo.  3.  After you shampoo, rinse your hair and body thoroughly to remove the shampoo.  4.  Use CHG as you would any other liquid soap.  You can apply chg directly       to the skin and wash gently with scrungie or a clean washcloth.  5.  Apply the CHG Soap to your body ONLY FROM THE NECK DOWN.        Do not use on open wounds or open sores.  Avoid contact with your eyes, ears, mouth and genitals (private parts).  Wash genitals (private parts) with your normal soap.  6.  Wash thoroughly, paying special attention to the area where your surgery        will be performed.  7.  Thoroughly rinse your body with warm water from the neck down.  8.  DO NOT shower/wash with your normal soap after using and rinsing off       the CHG Soap.  9.  Pat yourself dry with a clean towel.            10.  Wear clean pajamas.            11 .  Place clean sheets on your bed the night of  your first shower and do not        sleep with pets.  Day of Surgery  Do not apply any lotions/deodorants the morning of surgery.  Please wear clean clothes to the hospital.   Please read over the fact sheets that you were given.

## 2016-10-24 MED ORDER — DEXTROSE 5 % IV SOLN
1.5000 g | INTRAVENOUS | Status: AC
  Administered 2016-10-25: 1.5 g via INTRAVENOUS
  Filled 2016-10-24: qty 1.5

## 2016-10-24 MED ORDER — SODIUM CHLORIDE 0.9 % IV SOLN
INTRAVENOUS | Status: DC
  Administered 2016-10-25: 10:00:00 via INTRAVENOUS

## 2016-10-24 NOTE — Anesthesia Preprocedure Evaluation (Addendum)
Anesthesia Evaluation  Patient identified by MRN, date of birth, ID band Patient awake    Reviewed: Allergy & Precautions, NPO status , Patient's Chart, lab work & pertinent test results  Airway Mallampati: I       Dental  (+) Edentulous Upper   Pulmonary former smoker,    breath sounds clear to auscultation       Cardiovascular hypertension, + Peripheral Vascular Disease   Rhythm:Regular Rate:Normal     Neuro/Psych    GI/Hepatic GERD  ,(+) Hepatitis -, C  Endo/Other    Renal/GU ESRF and DialysisRenal disease     Musculoskeletal   Abdominal   Peds  Hematology   Anesthesia Other Findings   Reproductive/Obstetrics                            Anesthesia Physical Anesthesia Plan  ASA: III  Anesthesia Plan: General   Post-op Pain Management:    Induction: Intravenous  PONV Risk Score and Plan: 3 and Ondansetron, Dexamethasone, Propofol and Midazolam  Airway Management Planned: Oral ETT  Additional Equipment:   Intra-op Plan:   Post-operative Plan: Extubation in OR  Informed Consent: I have reviewed the patients History and Physical, chart, labs and discussed the procedure including the risks, benefits and alternatives for the proposed anesthesia with the patient or authorized representative who has indicated his/her understanding and acceptance.     Plan Discussed with: CRNA  Anesthesia Plan Comments:         Anesthesia Quick Evaluation

## 2016-10-25 ENCOUNTER — Inpatient Hospital Stay (HOSPITAL_COMMUNITY): Payer: Medicare Other | Admitting: Certified Registered Nurse Anesthetist

## 2016-10-25 ENCOUNTER — Encounter (HOSPITAL_COMMUNITY)
Admission: RE | Disposition: A | Discharge status: Home / Self Care | Payer: Self-pay | Source: Ambulatory Visit | Attending: Vascular Surgery

## 2016-10-25 ENCOUNTER — Inpatient Hospital Stay (HOSPITAL_COMMUNITY)
Admission: RE | Admit: 2016-10-25 | Discharge: 2016-10-26 | DRG: 271 | Disposition: A | Discharge status: Home / Self Care | Payer: Medicare Other | Source: Ambulatory Visit | Attending: Vascular Surgery | Admitting: Vascular Surgery

## 2016-10-25 DIAGNOSIS — I10 Essential (primary) hypertension: Secondary | ICD-10-CM | POA: Diagnosis present

## 2016-10-25 DIAGNOSIS — F17290 Nicotine dependence, other tobacco product, uncomplicated: Secondary | ICD-10-CM | POA: Diagnosis present

## 2016-10-25 DIAGNOSIS — I70211 Atherosclerosis of native arteries of extremities with intermittent claudication, right leg: Secondary | ICD-10-CM | POA: Diagnosis not present

## 2016-10-25 DIAGNOSIS — Z8249 Family history of ischemic heart disease and other diseases of the circulatory system: Secondary | ICD-10-CM | POA: Diagnosis not present

## 2016-10-25 DIAGNOSIS — Z940 Kidney transplant status: Secondary | ICD-10-CM

## 2016-10-25 DIAGNOSIS — I739 Peripheral vascular disease, unspecified: Secondary | ICD-10-CM | POA: Diagnosis present

## 2016-10-25 DIAGNOSIS — K219 Gastro-esophageal reflux disease without esophagitis: Secondary | ICD-10-CM | POA: Diagnosis not present

## 2016-10-25 DIAGNOSIS — I708 Atherosclerosis of other arteries: Secondary | ICD-10-CM | POA: Diagnosis not present

## 2016-10-25 DIAGNOSIS — Z79899 Other long term (current) drug therapy: Secondary | ICD-10-CM

## 2016-10-25 DIAGNOSIS — I779 Disorder of arteries and arterioles, unspecified: Secondary | ICD-10-CM | POA: Diagnosis present

## 2016-10-25 DIAGNOSIS — K648 Other hemorrhoids: Secondary | ICD-10-CM | POA: Diagnosis not present

## 2016-10-25 DIAGNOSIS — I771 Stricture of artery: Secondary | ICD-10-CM | POA: Diagnosis not present

## 2016-10-25 DIAGNOSIS — Z7982 Long term (current) use of aspirin: Secondary | ICD-10-CM

## 2016-10-25 DIAGNOSIS — K2930 Chronic superficial gastritis without bleeding: Secondary | ICD-10-CM | POA: Diagnosis not present

## 2016-10-25 DIAGNOSIS — E7800 Pure hypercholesterolemia, unspecified: Secondary | ICD-10-CM | POA: Diagnosis not present

## 2016-10-25 DIAGNOSIS — D126 Benign neoplasm of colon, unspecified: Secondary | ICD-10-CM | POA: Diagnosis not present

## 2016-10-25 DIAGNOSIS — Z91048 Other nonmedicinal substance allergy status: Secondary | ICD-10-CM

## 2016-10-25 DIAGNOSIS — K3189 Other diseases of stomach and duodenum: Secondary | ICD-10-CM | POA: Diagnosis not present

## 2016-10-25 HISTORY — PX: ENDARTERECTOMY FEMORAL: SHX5804

## 2016-10-25 LAB — CBC
HCT: 37.9 % — ABNORMAL LOW (ref 39.0–52.0)
Hemoglobin: 11.9 g/dL — ABNORMAL LOW (ref 13.0–17.0)
MCH: 26.4 pg (ref 26.0–34.0)
MCHC: 31.4 g/dL (ref 30.0–36.0)
MCV: 84 fL (ref 78.0–100.0)
Platelets: 176 K/uL (ref 150–400)
RBC: 4.51 MIL/uL (ref 4.22–5.81)
RDW: 15.5 % (ref 11.5–15.5)
WBC: 7 K/uL (ref 4.0–10.5)

## 2016-10-25 LAB — URINALYSIS, ROUTINE W REFLEX MICROSCOPIC
Bilirubin Urine: NEGATIVE
GLUCOSE, UA: NEGATIVE mg/dL
Hgb urine dipstick: NEGATIVE
KETONES UR: NEGATIVE mg/dL
LEUKOCYTES UA: NEGATIVE
NITRITE: NEGATIVE
PH: 6 (ref 5.0–8.0)
Protein, ur: NEGATIVE mg/dL
SPECIFIC GRAVITY, URINE: 1.008 (ref 1.005–1.030)

## 2016-10-25 LAB — CREATININE, SERUM
Creatinine, Ser: 1.26 mg/dL — ABNORMAL HIGH (ref 0.61–1.24)
GFR calc Af Amer: >60 mL/min
GFR calc non Af Amer: >60 mL/min

## 2016-10-25 SURGERY — ENDARTERECTOMY, FEMORAL
Anesthesia: General | Site: Leg Upper | Laterality: Right

## 2016-10-25 MED ORDER — MYCOPHENOLATE MOFETIL 250 MG PO CAPS
500.0000 mg | ORAL_CAPSULE | Freq: Every day | ORAL | Status: DC
  Administered 2016-10-26: 500 mg via ORAL
  Filled 2016-10-25: qty 2

## 2016-10-25 MED ORDER — ACETAMINOPHEN 325 MG PO TABS: 325.0000 mg | ORAL_TABLET | ORAL | Status: DC | PRN

## 2016-10-25 MED ORDER — SUGAMMADEX SODIUM 200 MG/2ML IV SOLN
INTRAVENOUS | Status: AC
  Filled 2016-10-25: qty 2

## 2016-10-25 MED ORDER — LIDOCAINE HCL (CARDIAC) 20 MG/ML IV SOLN
INTRAVENOUS | Status: DC | PRN
  Administered 2016-10-25: 80 mg via INTRAVENOUS

## 2016-10-25 MED ORDER — ROCURONIUM BROMIDE 100 MG/10ML IV SOLN
INTRAVENOUS | Status: DC | PRN
  Administered 2016-10-25: 50 mg via INTRAVENOUS
  Administered 2016-10-25: 10 mg via INTRAVENOUS
  Administered 2016-10-25: 20 mg via INTRAVENOUS

## 2016-10-25 MED ORDER — TAMSULOSIN HCL 0.4 MG PO CAPS
0.4000 mg | ORAL_CAPSULE | Freq: Every day | ORAL | Status: DC
  Administered 2016-10-26: 0.4 mg via ORAL
  Filled 2016-10-25: qty 1

## 2016-10-25 MED ORDER — FENTANYL CITRATE (PF) 100 MCG/2ML IJ SOLN
INTRAMUSCULAR | Status: DC | PRN
  Administered 2016-10-25: 100 ug via INTRAVENOUS
  Administered 2016-10-25: 50 ug via INTRAVENOUS

## 2016-10-25 MED ORDER — HYDROCODONE-ACETAMINOPHEN 5-325 MG PO TABS
1.0000 | ORAL_TABLET | ORAL | Status: DC | PRN
  Administered 2016-10-25 – 2016-10-26 (×3): 2 via ORAL
  Filled 2016-10-25 (×3): qty 2

## 2016-10-25 MED ORDER — CITALOPRAM HYDROBROMIDE 20 MG PO TABS
20.0000 mg | ORAL_TABLET | Freq: Every day | ORAL | Status: DC
  Administered 2016-10-26: 20 mg via ORAL
  Filled 2016-10-25: qty 1

## 2016-10-25 MED ORDER — CHLORHEXIDINE GLUCONATE 4 % EX LIQD: 60.0000 mL | Freq: Once | CUTANEOUS | Status: DC

## 2016-10-25 MED ORDER — MAGNESIUM SULFATE 2 GM/50ML IV SOLN: 2.0000 g | Freq: Every day | INTRAVENOUS | Status: DC | PRN

## 2016-10-25 MED ORDER — PROMETHAZINE HCL 25 MG/ML IJ SOLN: 6.2500 mg | INTRAMUSCULAR | Status: DC | PRN

## 2016-10-25 MED ORDER — SODIUM CHLORIDE 0.9 % IV SOLN
INTRAVENOUS | Status: DC
  Administered 2016-10-25: 13:00:00 via INTRAVENOUS

## 2016-10-25 MED ORDER — HYDROMORPHONE HCL 1 MG/ML IJ SOLN
INTRAMUSCULAR | Status: AC
  Administered 2016-10-25: 0.5 mg via INTRAVENOUS
  Filled 2016-10-25: qty 0.5

## 2016-10-25 MED ORDER — MIDAZOLAM HCL 5 MG/5ML IJ SOLN
INTRAMUSCULAR | Status: DC | PRN
  Administered 2016-10-25: 2 mg via INTRAVENOUS

## 2016-10-25 MED ORDER — MAGNESIUM OXIDE 400 (241.3 MG) MG PO TABS
400.0000 mg | ORAL_TABLET | Freq: Two times a day (BID) | ORAL | Status: DC
  Administered 2016-10-25 – 2016-10-26 (×2): 400 mg via ORAL
  Filled 2016-10-25 (×2): qty 1

## 2016-10-25 MED ORDER — GUAIFENESIN-DM 100-10 MG/5ML PO SYRP: 15.0000 mL | ORAL_SOLUTION | ORAL | Status: DC | PRN

## 2016-10-25 MED ORDER — THROMBIN 20000 UNITS EX SOLR
CUTANEOUS | Status: AC
  Filled 2016-10-25: qty 20000

## 2016-10-25 MED ORDER — SUGAMMADEX SODIUM 200 MG/2ML IV SOLN
INTRAVENOUS | Status: DC | PRN
  Administered 2016-10-25: 140 mg via INTRAVENOUS

## 2016-10-25 MED ORDER — PHENOL 1.4 % MT LIQD: 1.0000 | OROMUCOSAL | Status: DC | PRN

## 2016-10-25 MED ORDER — TACROLIMUS 1 MG PO CAPS: 1.0000 mg | ORAL_CAPSULE | ORAL | Status: DC

## 2016-10-25 MED ORDER — ONDANSETRON HCL 4 MG/2ML IJ SOLN
INTRAMUSCULAR | Status: AC
  Filled 2016-10-25: qty 2

## 2016-10-25 MED ORDER — LABETALOL HCL 5 MG/ML IV SOLN: 10.0000 mg | INTRAVENOUS | Status: DC | PRN

## 2016-10-25 MED ORDER — ASPIRIN 325 MG PO TABS
325.0000 mg | ORAL_TABLET | Freq: Two times a day (BID) | ORAL | Status: DC
  Administered 2016-10-25 – 2016-10-26 (×3): 325 mg via ORAL
  Filled 2016-10-25 (×3): qty 1

## 2016-10-25 MED ORDER — SODIUM CHLORIDE 0.9 % IV SOLN
INTRAVENOUS | Status: DC | PRN
  Administered 2016-10-25: 08:00:00

## 2016-10-25 MED ORDER — PANTOPRAZOLE SODIUM 40 MG PO TBEC
40.0000 mg | DELAYED_RELEASE_TABLET | Freq: Every day | ORAL | Status: DC
  Administered 2016-10-26: 40 mg via ORAL
  Filled 2016-10-25: qty 1

## 2016-10-25 MED ORDER — PROPOFOL 10 MG/ML IV BOLUS
INTRAVENOUS | Status: DC | PRN
  Administered 2016-10-25: 160 mg via INTRAVENOUS

## 2016-10-25 MED ORDER — HEMOSTATIC AGENTS (NO CHARGE) OPTIME
TOPICAL | Status: DC | PRN
  Administered 2016-10-25 (×2): 1 via TOPICAL

## 2016-10-25 MED ORDER — CITALOPRAM HYDROBROMIDE 20 MG PO TABS
10.0000 mg | ORAL_TABLET | Freq: Every day | ORAL | Status: DC
  Administered 2016-10-25 – 2016-10-26 (×2): 10 mg via ORAL
  Filled 2016-10-25 (×2): qty 1

## 2016-10-25 MED ORDER — ENOXAPARIN SODIUM 40 MG/0.4ML ~~LOC~~ SOLN
40.0000 mg | SUBCUTANEOUS | Status: DC
  Administered 2016-10-26: 40 mg via SUBCUTANEOUS
  Filled 2016-10-25: qty 0.4

## 2016-10-25 MED ORDER — ROCURONIUM BROMIDE 10 MG/ML (PF) SYRINGE
PREFILLED_SYRINGE | INTRAVENOUS | Status: AC
  Filled 2016-10-25: qty 5

## 2016-10-25 MED ORDER — DEXAMETHASONE SODIUM PHOSPHATE 10 MG/ML IJ SOLN
INTRAMUSCULAR | Status: DC | PRN
  Administered 2016-10-25: 5 mg via INTRAVENOUS

## 2016-10-25 MED ORDER — NIFEDIPINE ER OSMOTIC RELEASE 30 MG PO TB24
30.0000 mg | ORAL_TABLET | Freq: Every day | ORAL | Status: DC
  Administered 2016-10-26: 30 mg via ORAL
  Filled 2016-10-25: qty 1

## 2016-10-25 MED ORDER — HYDROMORPHONE HCL 1 MG/ML IJ SOLN
0.2500 mg | INTRAMUSCULAR | Status: DC | PRN
Start: 2016-10-25 — End: 2016-10-25
  Administered 2016-10-25 (×2): 0.5 mg via INTRAVENOUS

## 2016-10-25 MED ORDER — HEPARIN SODIUM (PORCINE) 1000 UNIT/ML IJ SOLN
INTRAMUSCULAR | Status: AC
  Filled 2016-10-25: qty 1

## 2016-10-25 MED ORDER — ZOLPIDEM TARTRATE 5 MG PO TABS
10.0000 mg | ORAL_TABLET | Freq: Every evening | ORAL | Status: DC | PRN
  Administered 2016-10-26: 10 mg via ORAL
  Filled 2016-10-25: qty 2

## 2016-10-25 MED ORDER — MIDAZOLAM HCL 2 MG/2ML IJ SOLN
INTRAMUSCULAR | Status: AC
  Filled 2016-10-25: qty 2

## 2016-10-25 MED ORDER — ONDANSETRON HCL 4 MG/2ML IJ SOLN
INTRAMUSCULAR | Status: DC | PRN
  Administered 2016-10-25: 4 mg via INTRAVENOUS

## 2016-10-25 MED ORDER — VITAMIN D3 25 MCG (1000 UNIT) PO TABS
5000.0000 [IU] | ORAL_TABLET | Freq: Two times a day (BID) | ORAL | Status: DC
  Administered 2016-10-25 – 2016-10-26 (×3): 5000 [IU] via ORAL
  Filled 2016-10-25 (×6): qty 5

## 2016-10-25 MED ORDER — PROPOFOL 10 MG/ML IV BOLUS
INTRAVENOUS | Status: AC
  Filled 2016-10-25: qty 20

## 2016-10-25 MED ORDER — ACETAMINOPHEN 650 MG RE SUPP: 325.0000 mg | RECTAL | Status: DC | PRN

## 2016-10-25 MED ORDER — 0.9 % SODIUM CHLORIDE (POUR BTL) OPTIME
TOPICAL | Status: DC | PRN
  Administered 2016-10-25: 2000 mL

## 2016-10-25 MED ORDER — LIDOCAINE 2% (20 MG/ML) 5 ML SYRINGE
INTRAMUSCULAR | Status: AC
  Filled 2016-10-25: qty 5

## 2016-10-25 MED ORDER — PREGABALIN 25 MG PO CAPS
50.0000 mg | ORAL_CAPSULE | Freq: Three times a day (TID) | ORAL | Status: DC
  Administered 2016-10-25 – 2016-10-26 (×3): 50 mg via ORAL
  Filled 2016-10-25 (×3): qty 2

## 2016-10-25 MED ORDER — HYDRALAZINE HCL 20 MG/ML IJ SOLN: 5.0000 mg | INTRAMUSCULAR | Status: DC | PRN

## 2016-10-25 MED ORDER — ROSUVASTATIN CALCIUM 10 MG PO TABS
5.0000 mg | ORAL_TABLET | Freq: Every day | ORAL | Status: DC
  Administered 2016-10-25: 5 mg via ORAL
  Filled 2016-10-25: qty 1

## 2016-10-25 MED ORDER — LACTATED RINGERS IV SOLN
INTRAVENOUS | Status: DC | PRN
  Administered 2016-10-25: 07:00:00 via INTRAVENOUS

## 2016-10-25 MED ORDER — TACROLIMUS 1 MG PO CAPS
1.0000 mg | ORAL_CAPSULE | Freq: Every day | ORAL | Status: DC
  Administered 2016-10-25: 1 mg via ORAL
  Filled 2016-10-25: qty 1

## 2016-10-25 MED ORDER — ALUM & MAG HYDROXIDE-SIMETH 200-200-20 MG/5ML PO SUSP: 15.0000 mL | ORAL | Status: DC | PRN

## 2016-10-25 MED ORDER — SODIUM CHLORIDE 0.9 % IV SOLN
500.0000 mL | Freq: Once | INTRAVENOUS | Status: DC | PRN
Start: 2016-10-25 — End: 2016-10-26

## 2016-10-25 MED ORDER — HEPARIN SODIUM (PORCINE) 1000 UNIT/ML IJ SOLN
INTRAMUSCULAR | Status: DC | PRN
  Administered 2016-10-25: 7500 [IU] via INTRAVENOUS
  Administered 2016-10-25: 2000 [IU] via INTRAVENOUS

## 2016-10-25 MED ORDER — PROTAMINE SULFATE 10 MG/ML IV SOLN
INTRAVENOUS | Status: AC
  Filled 2016-10-25: qty 10

## 2016-10-25 MED ORDER — CITALOPRAM HYDROBROMIDE 20 MG PO TABS: 10.0000 mg | ORAL_TABLET | Freq: Three times a day (TID) | ORAL | Status: DC

## 2016-10-25 MED ORDER — POTASSIUM CHLORIDE CRYS ER 20 MEQ PO TBCR: 20.0000 meq | EXTENDED_RELEASE_TABLET | Freq: Every day | ORAL | Status: DC | PRN

## 2016-10-25 MED ORDER — MYCOPHENOLATE MOFETIL 250 MG PO CAPS
250.0000 mg | ORAL_CAPSULE | Freq: Every day | ORAL | Status: DC
  Administered 2016-10-25: 250 mg via ORAL
  Filled 2016-10-25 (×2): qty 1

## 2016-10-25 MED ORDER — DOCUSATE SODIUM 100 MG PO CAPS
100.0000 mg | ORAL_CAPSULE | Freq: Every day | ORAL | Status: DC
  Administered 2016-10-26: 100 mg via ORAL
  Filled 2016-10-25: qty 1

## 2016-10-25 MED ORDER — HYDROMORPHONE HCL 1 MG/ML IJ SOLN: 0.5000 mg | INTRAMUSCULAR | Status: DC | PRN

## 2016-10-25 MED ORDER — LISDEXAMFETAMINE DIMESYLATE 20 MG PO CAPS
20.0000 mg | ORAL_CAPSULE | Freq: Two times a day (BID) | ORAL | Status: DC
  Administered 2016-10-25 – 2016-10-26 (×3): 20 mg via ORAL
  Filled 2016-10-25 (×3): qty 1

## 2016-10-25 MED ORDER — DEXAMETHASONE SODIUM PHOSPHATE 10 MG/ML IJ SOLN
INTRAMUSCULAR | Status: AC
  Filled 2016-10-25: qty 1

## 2016-10-25 MED ORDER — FENTANYL CITRATE (PF) 250 MCG/5ML IJ SOLN
INTRAMUSCULAR | Status: AC
  Filled 2016-10-25: qty 5

## 2016-10-25 MED ORDER — METOPROLOL TARTRATE 5 MG/5ML IV SOLN: 2.0000 mg | INTRAVENOUS | Status: DC | PRN

## 2016-10-25 MED ORDER — TACROLIMUS 1 MG PO CAPS
2.0000 mg | ORAL_CAPSULE | Freq: Every day | ORAL | Status: DC
  Administered 2016-10-26: 2 mg via ORAL
  Filled 2016-10-25: qty 2

## 2016-10-25 MED ORDER — DEXTROSE 5 % IV SOLN
INTRAVENOUS | Status: DC | PRN
  Administered 2016-10-25: 10 ug/min via INTRAVENOUS

## 2016-10-25 MED ORDER — PROTAMINE SULFATE 10 MG/ML IV SOLN
INTRAVENOUS | Status: DC | PRN
  Administered 2016-10-25: 60 mg via INTRAVENOUS

## 2016-10-25 MED ORDER — MYCOPHENOLATE MOFETIL 250 MG PO CAPS: 250.0000 mg | ORAL_CAPSULE | ORAL | Status: DC

## 2016-10-25 MED ORDER — ONDANSETRON HCL 4 MG/2ML IJ SOLN: 4.0000 mg | Freq: Four times a day (QID) | INTRAMUSCULAR | Status: DC | PRN

## 2016-10-25 MED ORDER — DEXTROSE 5 % IV SOLN
1.5000 g | Freq: Two times a day (BID) | INTRAVENOUS | Status: AC
  Administered 2016-10-25 – 2016-10-26 (×2): 1.5 g via INTRAVENOUS
  Filled 2016-10-25 (×2): qty 1.5

## 2016-10-25 SURGICAL SUPPLY — 43 items
ADH SKN CLS APL DERMABOND .7 (GAUZE/BANDAGES/DRESSINGS) ×1
AGENT HMST SPONGE THK3/8 (HEMOSTASIS) ×2
CANISTER SUCT 3000ML PPV (MISCELLANEOUS) ×3 IMPLANT
CLIP TI MEDIUM 24 (CLIP) ×3 IMPLANT
CLIP TI WIDE RED SMALL 24 (CLIP) ×3 IMPLANT
DERMABOND ADVANCED (GAUZE/BANDAGES/DRESSINGS) ×2
DERMABOND ADVANCED .7 DNX12 (GAUZE/BANDAGES/DRESSINGS) IMPLANT
DRAIN CHANNEL 15F RND FF W/TCR (WOUND CARE) IMPLANT
ELECT REM PT RETURN 9FT ADLT (ELECTROSURGICAL) ×3
ELECTRODE REM PT RTRN 9FT ADLT (ELECTROSURGICAL) ×1 IMPLANT
EVACUATOR SILICONE 100CC (DRAIN) IMPLANT
GAUZE SPONGE 4X4 16PLY XRAY LF (GAUZE/BANDAGES/DRESSINGS) ×2 IMPLANT
GLOVE BIO SURGEON STRL SZ7 (GLOVE) ×5 IMPLANT
GLOVE BIOGEL PI IND STRL 6.5 (GLOVE) IMPLANT
GLOVE BIOGEL PI IND STRL 7.5 (GLOVE) ×1 IMPLANT
GLOVE BIOGEL PI INDICATOR 6.5 (GLOVE) ×6
GLOVE BIOGEL PI INDICATOR 7.5 (GLOVE) ×2
GLOVE ECLIPSE 6.5 STRL STRAW (GLOVE) ×2 IMPLANT
GOWN STRL REUS W/ TWL LRG LVL3 (GOWN DISPOSABLE) ×3 IMPLANT
GOWN STRL REUS W/ TWL XL LVL3 (GOWN DISPOSABLE) IMPLANT
GOWN STRL REUS W/TWL LRG LVL3 (GOWN DISPOSABLE) ×12
GOWN STRL REUS W/TWL XL LVL3 (GOWN DISPOSABLE) ×3
HEMOSTAT SPONGE AVITENE ULTRA (HEMOSTASIS) ×4 IMPLANT
KIT BASIN OR (CUSTOM PROCEDURE TRAY) ×3 IMPLANT
KIT ROOM TURNOVER OR (KITS) ×3 IMPLANT
NS IRRIG 1000ML POUR BTL (IV SOLUTION) ×6 IMPLANT
PACK PERIPHERAL VASCULAR (CUSTOM PROCEDURE TRAY) ×3 IMPLANT
PAD ARMBOARD 7.5X6 YLW CONV (MISCELLANEOUS) ×6 IMPLANT
PATCH VASC XENOSURE 1CMX6CM (Vascular Products) ×3 IMPLANT
PATCH VASC XENOSURE 1X6 (Vascular Products) IMPLANT
SPONGE SURGIFOAM ABS GEL 100 (HEMOSTASIS) IMPLANT
SUT MNCRL AB 4-0 PS2 18 (SUTURE) ×3 IMPLANT
SUT PROLENE 3 0 SH 48 (SUTURE) ×2 IMPLANT
SUT PROLENE 5 0 C 1 24 (SUTURE) ×5 IMPLANT
SUT PROLENE 6 0 BV (SUTURE) ×8 IMPLANT
SUT PROLENE 7 0 BV1 MDA (SUTURE) ×4 IMPLANT
SUT VIC AB 2-0 CT1 27 (SUTURE) ×3
SUT VIC AB 2-0 CT1 TAPERPNT 27 (SUTURE) ×1 IMPLANT
SUT VIC AB 3-0 SH 27 (SUTURE) ×3
SUT VIC AB 3-0 SH 27X BRD (SUTURE) ×1 IMPLANT
TRAY FOLEY W/METER SILVER 16FR (SET/KITS/TRAYS/PACK) IMPLANT
UNDERPAD 30X30 (UNDERPADS AND DIAPERS) ×3 IMPLANT
WATER STERILE IRR 1000ML POUR (IV SOLUTION) ×3 IMPLANT

## 2016-10-25 NOTE — Op Note (Signed)
OPERATIVE NOTE   PROCEDURE: 1. Redo right femoral exposure 2. Right iliofemoral endarterectomy with bovine patch angioplasty  PRE-OPERATIVE DIAGNOSIS: right distal external iliac artery high grade stenosis  POST-OPERATIVE DIAGNOSIS: same as above   SURGEON: [name], MD  ASSISTANT(S): Silva Bandy, PAC   ANESTHESIA: general  ESTIMATED BLOOD LOSS: 150 cc  FINDING(S): 1.  Heavily calcified near occluded distal external iliac artery  2.  Diffuse non-calcified distal external iliac artery plaque causing minimal luminal stenosis proximal to calcific plaque 3.  Mid-common femoral artery plaque <30% luminal stenosis 4.  Palpable posterior tibial artery at end of case: biphasic signal  SPECIMEN(S):  none  INDICATIONS:   Scott Chavez is a 61 y.o. male with cadaveric right kidney transplant who presents with drop off in right left ABI and recurrent right leg intermittent claudication.  On angiography, he had a >75% stenosis in the distal external iliac artery that appeared to be compromising the inflow to his right femoropopliteal bypass.  I recommended: right iliofemoral endarterectomy with bovine patch angioplasty.  The risk, benefits, and alternative for endarterectomy operations were discussed with the patient.  The patient is aware the risks include but are not limited to: bleeding, infection, myocardial infarction, stroke, limb loss, nerve damage, limb edema, need for additional procedures in the future, wound complications, and inability to complete the bypass.  I explicitly discussed the risk of possible dissection of the iliac artery segment to which the kidney transplant is implanted, so I discussed limiting the endarterectomy to avoid such.  The patient is aware of these risks and agreed to proceed.   DESCRIPTION: After obtaining full informed written consent, the patient was brought back to the operating room and placed supine upon the operating table.  The patient received  IV antibiotics prior to induction.  A procedure time out was completed and the correct surgical site was verified.  After obtaining adequate anesthesia, the patient was prepped and draped in the standard fashion for: right iliofemoral endarterectomy.  I made an incision through the prior right groin incision.  Using blunt dissection and electrocautery, I slowly dissected through the prior scar tissue until I could identify the the location of the calcific iliofemoral plaque.  I carefully dissected out the distal external iliac artery and proximal common femoral artery.  I was able to stay proximal to the prior femoropopliteal bypass.  I found that I could not get proximal enough on the external iliac artery without taking down the inguinal ligament partially.  I transected ~3 cm of the inguinal ligament with electrocautery.  This gave me enough laxity to get proximal to the heavily calcified iliofemoral plaque.  The anatomy was identical to the anatomy on the angiogram.  I tied over the cross vein in the retroperitoneum.  Due to some continued bleeding, I had to oversewed the medial end of this vein complex with a double layer of 5-0 Prolene.  I dissected proximal and distal to the calcific plaque and placed vessel loops.  The patient was given 7500 units of Heparin intravenously, which was a therapeutic bolus. An additional 2000 units of Heparin was administered every hour after initial bolus to maintain anticoagulation.  In total, 9500 units of Heparin was administrated to achieve and maintain a therapeutic level of anticoagulation.  After waiting 3 minutes, I clamped the distal external iliac artery in the retroperitoneum and then clamped the common femoral artery in the mid-segment of the common femoral artery.  I made an arteriotomy in  the mid-segment of this plaque and extended it with difficult proximally and distally due to the severe calcification.  I started the endarterectomy in the mid-segment and  dissected distally until I found a segment densely adherent.  I transected the plaque here to avoid compromising the segment with the femoropopliteal bypass anastomosis.  I then carried this dissection into the distal external iliac artery.  I was able to transected the plaque at the proximal end of the calcified segment.  Unfortunately, there appeared to extensive friable disease here, so I extended the arteriotomy another 3 cm proximally and continued the endarterectomy until the residual limited plaque caused minimal luminal compromise.  This plaque appeared to extend proximally but was adherent, so I felt pursuing this plaque proximally was not necessary.  At this point, I removed any residual intimal flaps and then tacked down the distal residual common femoral artery plaque, which I felt represented <30% luminal stenosis, with 7-0 Prolene stitches.  I then obtained a bovine pericardial patch and fashioned it to the geometry of this arteriotomy.  I sewed the patch in place with two 6-0 Prolene stitches, running from each end and tying in the middle.  Prior to completing this patch angioplasty, I backbled each end of the artery.  There was excellent pulsatile bleeding from both ends and no thrombus or debris.  I completed this patch angioplasty in the usual fashion.  I released all clamps and immediately there was a palpable posterior tibial artery pulse, confirmed with a multiphasic posterior tibial artery signals.  I packed the right groin with Avitene and gave 60 mg of Protamine to reverse anticoagulation.  I had to tighten up the suture line with a 6-0 Prolene stitch.  After another a few rounds of Avitene, bleeding stopped.  I repaired the inguinal ligament with a 3-0 Prolene stitch.  I then reapproximated the deep tissue with a double layer of 2-0 Vicryl immediately over the artery and vein.  A double layer of 3-0 Vicryl in the superficial subcutaneous tissue was sewn followed by a running subcuticular  stitch of 4-0 Monocryl in the skin.  The skin was cleaned, dried and reinforced with Dermabond.     COMPLICATIONS: none  CONDITION: stable   [name], MD, [facility] Office: [phone] Pager: [phone]  10/25/2016, 10:51 AM

## 2016-10-25 NOTE — Anesthesia Procedure Notes (Addendum)
Procedure Name: Intubation Date/Time: 10/25/2016 7:38 AM Performed by: Mariea Clonts Pre-anesthesia Checklist: Patient identified, Emergency Drugs available, Suction available and Patient being monitored Patient Re-evaluated:Patient Re-evaluated prior to inductionOxygen Delivery Method: Circle System Utilized Preoxygenation: Pre-oxygenation with 100% oxygen Intubation Type: IV induction Ventilation: Mask ventilation without difficulty Laryngoscope Size: Mac and 4 Grade View: Grade I Tube type: Oral Tube size: 7.5 mm Number of attempts: 1 Airway Equipment and Method: Stylet and Oral airway Placement Confirmation: ETT inserted through vocal cords under direct vision,  positive ETCO2 and breath sounds checked- equal and bilateral Secured at: 23 (lip) cm Tube secured with: Tape Dental Injury: Teeth and Oropharynx as per pre-operative assessment

## 2016-10-25 NOTE — Progress Notes (Signed)
  Day of Surgery Note    Subjective:  No complaints   Vitals:   10/25/16 1130 10/25/16 1138  BP:  110/75  Pulse: 64 73  Resp: 16 12  Temp:      Incisions:   Clean and dry without hematoma Extremities:  Easily palpable right PT pulse Cardiac:  regular Lungs:  Non labored   Assessment/Plan:  This is a 61 y.o. male who is s/p  1. Redo right femoral exposure 2. Right iliofemoral endarterectomy with bovine patch angioplasty  -pt doing well in pacu with palpable right PT pulse -to 4 east when bed available   Leontine Locket, PA-C 10/25/2016 11:39 AM [phone]

## 2016-10-25 NOTE — Anesthesia Postprocedure Evaluation (Signed)
Anesthesia Post Note  Patient: Scott Chavez  Procedure(s) Performed: Procedure(s) (LRB): RIGHT ILIOFEMORAL ENDARTERECTOMY WITH BOVINE PERICARDIAL PATCH ANGIOPLASTY (Right)     Patient location during evaluation: PACU Anesthesia Type: General Level of consciousness: awake, oriented and sedated Pain management: pain level controlled Vital Signs Assessment: post-procedure vital signs reviewed and stable Respiratory status: spontaneous breathing, nonlabored ventilation, respiratory function stable and patient connected to nasal cannula oxygen Cardiovascular status: blood pressure returned to baseline and stable Postop Assessment: no signs of nausea or vomiting Anesthetic complications: no    Last Vitals:  Vitals:   10/25/16 1209 10/25/16 1215  BP: 107/67   Pulse: 69 62  Resp: 15 16  Temp:      Last Pain:  Vitals:   10/25/16 1209  TempSrc:   PainSc: 0-No pain    LLE Motor Response: Purposeful movement;Responds to commands (10/25/16 1209) LLE Sensation: Full sensation;No numbness;No tingling (10/25/16 1209) RLE Motor Response: Purposeful movement;Responds to commands (10/25/16 1209) RLE Sensation: Full sensation;No numbness;No tingling (10/25/16 1209)      Angell Honse,JAMES TERRILL
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
## 2016-10-25 NOTE — Interval H&P Note (Signed)
History and Physical Interval Note:  10/25/2016 7:16 AM  Scott Chavez  has presented today for surgery, with the diagnosis of Peripheral Vascular Disease with Right Lower Extremity Claudication  I70.211 Bilateral External Iliac Artery Stenosis I77.1   The various methods of treatment have been discussed with the patient and family. After consideration of risks, benefits and other options for treatment, the patient has consented to  Procedure(s): RIGHT ILIOFEMORAL ENDARTERECTOMY WITH BOVINE PERICARDIAL PATCH ANGIOPLASTY (Right) as a surgical intervention .  The patient's history has been reviewed, patient examined, no change in status, stable for surgery.  I have reviewed the patient's chart and labs.  Questions were answered to the patient's satisfaction.     [name]

## 2016-10-25 NOTE — Transfer of Care (Cosign Needed)
Immediate Anesthesia Transfer of Care Note  Patient: Scott Chavez  Procedure(s) Performed: Procedure(s): RIGHT ILIOFEMORAL ENDARTERECTOMY WITH BOVINE PERICARDIAL PATCH ANGIOPLASTY (Right)  Patient Location: PACU  Anesthesia Type:General  Level of Consciousness: awake, alert , oriented and patient cooperative  Airway & Oxygen Therapy: Patient Spontanous Breathing and Patient connected to nasal cannula oxygen  Post-op Assessment: Report given to RN, Post -op Vital signs reviewed and stable and Patient moving all extremities X 4  Post vital signs: Reviewed and stable  Last Vitals:  Vitals:   10/25/16 0622  BP: 117/74  Pulse: 63  Resp: 18  Temp: 36.9 C    Last Pain:  Vitals:   10/25/16 0622  TempSrc: Oral         Complications: No apparent anesthesia complications

## 2016-10-26 ENCOUNTER — Encounter (HOSPITAL_COMMUNITY): Payer: Self-pay | Admitting: Vascular Surgery

## 2016-10-26 ENCOUNTER — Encounter (HOSPITAL_COMMUNITY): Payer: Medicare Other

## 2016-10-26 DIAGNOSIS — K2930 Chronic superficial gastritis without bleeding: Secondary | ICD-10-CM | POA: Diagnosis not present

## 2016-10-26 DIAGNOSIS — K3189 Other diseases of stomach and duodenum: Secondary | ICD-10-CM | POA: Diagnosis not present

## 2016-10-26 DIAGNOSIS — D126 Benign neoplasm of colon, unspecified: Secondary | ICD-10-CM | POA: Diagnosis not present

## 2016-10-26 LAB — CBC
HCT: 34.8 % — ABNORMAL LOW (ref 39.0–52.0)
Hemoglobin: 11.1 g/dL — ABNORMAL LOW (ref 13.0–17.0)
MCH: 26.6 pg (ref 26.0–34.0)
MCHC: 31.9 g/dL (ref 30.0–36.0)
MCV: 83.3 fL (ref 78.0–100.0)
Platelets: 167 10*3/uL (ref 150–400)
RBC: 4.18 MIL/uL — ABNORMAL LOW (ref 4.22–5.81)
RDW: 15.4 % (ref 11.5–15.5)
WBC: 6.8 10*3/uL (ref 4.0–10.5)

## 2016-10-26 LAB — URINALYSIS, COMPLETE (UACMP) WITH MICROSCOPIC
Bacteria, UA: NONE SEEN
Bilirubin Urine: NEGATIVE
GLUCOSE, UA: NEGATIVE mg/dL
HGB URINE DIPSTICK: NEGATIVE
Ketones, ur: NEGATIVE mg/dL
LEUKOCYTES UA: NEGATIVE
NITRITE: NEGATIVE
Protein, ur: 30 mg/dL — AB
Specific Gravity, Urine: 1.011 (ref 1.005–1.030)
pH: 6 (ref 5.0–8.0)

## 2016-10-26 LAB — BASIC METABOLIC PANEL
Anion gap: 7 (ref 5–15)
BUN: 9 mg/dL (ref 6–20)
CALCIUM: 9.2 mg/dL (ref 8.9–10.3)
CO2: 23 mmol/L (ref 22–32)
CREATININE: 1.11 mg/dL (ref 0.61–1.24)
Chloride: 105 mmol/L (ref 101–111)
Glucose, Bld: 105 mg/dL — ABNORMAL HIGH (ref 65–99)
Potassium: 4 mmol/L (ref 3.5–5.1)
SODIUM: 135 mmol/L (ref 135–145)

## 2016-10-26 MED ORDER — TACROLIMUS 1 MG PO CAPS
3.0000 mg | ORAL_CAPSULE | Freq: Two times a day (BID) | ORAL | Status: DC
  Filled 2016-10-26: qty 3

## 2016-10-26 MED ORDER — HYDROCODONE-ACETAMINOPHEN 5-325 MG PO TABS: 1.0000 | ORAL_TABLET | Freq: Four times a day (QID) | ORAL | 0 refills | Status: DC | PRN

## 2016-10-26 NOTE — Evaluation (Signed)
Physical Therapy Evaluation Patient Details Name: Scott Chavez MRN: [id number] DOB: [birth year] Today's Date: 10/26/2016   History of Present Illness  Pt is a 61 yo male admitted with mild neuropathic symptoms in his R leg s/p R iliofemoral endarctectomy. PMH significant for HTN, ESRD, PVD, PAD, Aortoilliac disease.   Clinical Impression  Patient is s/p above surgery resulting in functional limitations due to the deficits listed below (see PT Problem List). Pt is min guard for transfers and ambulation of 200 ft with RW.  Patient will benefit from skilled PT to perform stair training and to increase their independence and safety with mobility to allow discharge to the venue listed below.       Follow Up Recommendations No PT follow up    Equipment Recommendations  None recommended by PT    Recommendations for Other Services       Precautions / Restrictions Precautions Precautions: Fall Restrictions Weight Bearing Restrictions: No      Mobility  Bed Mobility               General bed mobility comments: pt in recliner at entry  Transfers Overall transfer level: Needs assistance Equipment used: None Transfers: Sit to/from Stand Sit to Stand: Min guard         General transfer comment: min guard for safety  Ambulation/Gait Ambulation/Gait assistance: Min guard Ambulation Distance (Feet): 200 Feet Assistive device: Rolling walker (2 wheeled) Gait Pattern/deviations: Decreased step length - right;Decreased step length - left;Decreased weight shift to right;Ataxic;Trunk flexed Gait velocity: slowed Gait velocity interpretation: Below normal speed for age/gender General Gait Details: min guard for safety vc for upright posture and relaxed shoulders      Balance Overall balance assessment: No apparent balance deficits (not formally assessed)                                           Pertinent Vitals/Pain Pain Assessment: 0-10 Pain Score: 7   Pain Location: R groin  Pain Descriptors / Indicators: Grimacing;Guarding;Sharp Pain Intervention(s): Monitored during session;Limited activity within patient's tolerance    Home Living Family/patient expects to be discharged to:: Private residence Living Arrangements: Alone Available Help at Discharge: Friend(s);Available 24 hours/day Type of Home: House Home Access: Stairs to enter Entrance Stairs-Rails: None Entrance Stairs-Number of Steps: 4 Home Layout: One level Home Equipment: Walker - 2 wheels;Cane - single point;Grab bars - tub/shower;Grab bars - toilet      Prior Function Level of Independence: Independent         Comments: community ambulator and driver        Extremity/Trunk Assessment   Upper Extremity Assessment Upper Extremity Assessment: Overall WFL for tasks assessed    Lower Extremity Assessment Lower Extremity Assessment: RLE deficits/detail RLE Deficits / Details: R hip ROM not assessed due to surgicall intervention, R knee and ankle ROM WFL and strength grossly 4/5  RLE: Unable to fully assess due to pain RLE Sensation: history of peripheral neuropathy (light touch intact) LLE Deficits / Details: L LE ROM grossly WFL, strength grossly 4/5  LLE Sensation: history of peripheral neuropathy    Cervical / Trunk Assessment Cervical / Trunk Assessment: Normal  Communication   Communication: No difficulties  Cognition Arousal/Alertness: Awake/alert Behavior During Therapy: WFL for tasks assessed/performed Overall Cognitive Status: Within Functional Limits for tasks assessed  General Comments General comments (skin integrity, edema, etc.): at rest on RA SaO2 100%O2, HR 65 bpm, with activity HR rose to 140 bpm and SaO2 dropped to 95%O2, after 1 minute rest SaO2 returned to 99%O2 and HR 60 bpm        Assessment/Plan    PT Assessment Patient needs continued PT services  PT Problem List  Decreased range of motion;Decreased mobility;Pain       PT Treatment Interventions DME instruction;Gait training;Stair training;Functional mobility training;Therapeutic activities;Therapeutic exercise;Patient/family education    PT Goals (Current goals can be found in the Care Plan section)  Acute Rehab PT Goals Patient Stated Goal: go home PT Goal Formulation: With patient Time For Goal Achievement: 11/02/16 Potential to Achieve Goals: Good    Frequency Min 3X/week    AM-PAC PT "6 Clicks" Daily Activity  Outcome Measure Difficulty turning over in bed (including adjusting bedclothes, sheets and blankets)?: A Little Difficulty moving from lying on back to sitting on the side of the bed? : A Little Difficulty sitting down on and standing up from a chair with arms (e.g., wheelchair, bedside commode, etc,.)?: A Little Help needed moving to and from a bed to chair (including a wheelchair)?: None Help needed walking in hospital room?: None Help needed climbing 3-5 steps with a railing? : A Little 6 Click Score: 20    End of Session Equipment Utilized During Treatment: Gait belt Activity Tolerance: Patient tolerated treatment well Patient left: in chair;with call bell/phone within reach Nurse Communication: Mobility status PT Visit Diagnosis: Other abnormalities of gait and mobility (R26.89);Difficulty in walking, not elsewhere classified (R26.2);Pain Pain - Right/Left: Right Pain - part of body: Leg    Time: 1245-1320 PT Time Calculation (min) (ACUTE ONLY): 35 min   Charges:   PT Evaluation $PT Eval Moderate Complexity: 1 Procedure PT Treatments $Gait Training: 8-22 mins   PT G Codes:        Matisha Termine B. Migdalia Dk PT, DPT Acute Rehabilitation  [phone] Pager [phone]    [place] 10/26/2016, 1:39 PM

## 2016-10-26 NOTE — Progress Notes (Signed)
Discussed discharge instructions and medications with patient and patients wife. Follow up appointment discussed.  Both verbalized understanding with all questions answered. VSS. Pt discharged home with wife.  [facility]

## 2016-10-26 NOTE — Progress Notes (Addendum)
  [facility] Progress Note  Subjective  - POD #1  Some mild pain with incision. No pain or numbness right foot.   Objective Vitals:   10/25/16 2244 10/26/16 0255  BP: 139/82 128/78  Pulse: 70 63  Resp: 16 15  Temp: 97.9 F (36.6 C) 98.3 F (36.8 C)    Intake/Output Summary (Last 24 hours) at 10/26/16 0731 Last data filed at 10/26/16 0600  Gross per 24 hour  Intake          1754.17 ml  Output             1475 ml  Net           279.17 ml   Right groin incision clean and intact without hematoma. Brisk biphasic right PT and peroneal signals. PT easily palpable yesterday, but unable to palpate on my exam.   Assessment/Planning: 61 y.o. male is s/p: right iliofemoral endarterectomy with bovine pericardial patch angioplasty 1 Day Post-Op   Doing well post-op. Unable to palpate right PT today on my exam, but easily dopplerable. Has ambulated without difficulty. Potential d/c home today. Will have Dr. Bridgett Larsson evaluate.   Alvia [place] 10/26/2016 7:31 AM --  Laboratory CBC    Component Value Date/Time   WBC 6.8 10/26/2016 0018   HGB 11.1 (L) 10/26/2016 0018   HCT 34.8 (L) 10/26/2016 0018   PLT 167 10/26/2016 0018    BMET    Component Value Date/Time   NA 135 10/26/2016 0018   K 4.0 10/26/2016 0018   CL 105 10/26/2016 0018   CO2 23 10/26/2016 0018   GLUCOSE 105 (H) 10/26/2016 0018   BUN 9 10/26/2016 0018   CREATININE 1.11 10/26/2016 0018   CREATININE 1.11 10/20/2015 1355   CALCIUM 9.2 10/26/2016 0018   GFRNONAA >60 10/26/2016 0018   GFRNONAA 72 10/20/2015 1355   GFRAA >60 10/26/2016 0018   GFRAA 84 10/20/2015 1355    COAG Lab Results  Component Value Date   INR 1.03 10/22/2016   INR 1.03 04/16/2015   INR 0.92 04/19/2013   No results found for: PTT  Antibiotics Anti-infectives    Start     Dose/Rate Route Frequency Ordered Stop   10/25/16 2000  cefUROXime (ZINACEF) 1.5 g in dextrose 5 % 50 mL IVPB     1.5 g 100 mL/hr over 30  Minutes Intravenous Every 12 hours 10/25/16 1327 10/26/16 1959   10/25/16 0700  cefUROXime (ZINACEF) 1.5 g in dextrose 5 % 50 mL IVPB     1.5 g 100 mL/hr over 30 Minutes Intravenous To ShortStay Surgical 10/24/16 0910 10/25/16 [place], PA-C [facility] Office: [phone] Pager: [phone] 10/26/2016 7:31 AM   Addendum  I have independently interviewed and examined the patient, and I agree with the physician assistant's findings.  Biphasic PT on exam without any hematoma in R groin.  - D/C home - F/U 2 weeks - R ABI in 2 weeks  [name], MD, FACS [facility] [place] Office: [phone] Pager: [phone]  10/26/2016, 11:30 AM

## 2016-10-26 NOTE — Progress Notes (Signed)
Notified MD of UA results. Culture is still pending. GIven the ok to discharge.

## 2016-10-26 NOTE — Progress Notes (Signed)
Patient complaining of urinary frequency and pressure. Notified MD. Received order to get UA and culture. Will continue to monitor.

## 2016-10-26 NOTE — Care Management Note (Addendum)
Case Management Note  Patient Details  Name: Scott Chavez MRN: [id number] Date of Birth: [birth year]  Subjective/Objective:     s/p: right iliofemoral endarterectomy with bovine pericardial patch angioplasty               Action/Plan: Discharge Planning: NCM spoke to pt and lives at home with wife. Chart reviewed. No NCM needs identified.   PCP Corliss Parish MD   Expected Discharge Date:  10/26/16               Expected Discharge Plan:  Home/Self Care  In-House Referral:  NA  Discharge planning Services  CM Consult  Post Acute Care Choice:  NA Choice offered to:  NA  DME Arranged:  N/A DME Agency:  NA  HH Arranged:  NA HH Agency:  NA  Status of Service:  Completed, signed off  If discussed at [place] of Stay Meetings, dates discussed:    Additional Comments:  Erenest Rasher, RN 10/26/2016, 11:47 AM

## 2016-10-27 LAB — URINE CULTURE: CULTURE: NO GROWTH

## 2016-10-28 NOTE — Discharge Summary (Signed)
[facility] Discharge Summary  Scott Chavez Jun 11, 1955 61 y.o. male  [id number]  Admission Date: 10/25/2016  Discharge Date: 10/26/2016  Physician: [name], MD  Admission Diagnosis: Peripheral Vascular Disease with Right Lower Extremity Claudication  I70.211 Bilateral External Iliac Artery Stenosis I77.1   HPI:   This is a 61 y.o. male  who presents with chief complaint: mild neuropathic sx in R leg as previously. Previous operation(s) include:   1. L iliofem EA w/ BPA, L CFA to BK pop BPG with NR ips GSV (04/24/13) 2. PTA+S R CIA and L CIA (10/25/13) 3. OA+PTA R F-P (12/13/13) (ERROR in op note in laterality) 4. R iliofem EA w/ BPA, extended R profundaplasty, R CFA to BK pop BPG w/ NR ips GSV (04/17/15) 5. Excision of thrombosed L RC AVF  The patient's symptoms are stable but worse from their nadir. His intermittent claudicationsx are mild in R leg and resolved in L.  His sx are some aching in his R pelvis. The patient's treatment regimen currently included: maximal medical management and walking.  Patient's angiogram on 05/10/16 demonstrated R distal EIA stenosis that will need iliofemoral endarterectomy followed by L iliofemoral EA w/ peroneal OA+PTA.  Hospital Course:  The patient was admitted to the hospital and taken to the operating room on 10/25/2016 and underwent: 1. Redo right femoral exposure 2. Right iliofemoral endarterectomy with bovine patch angioplasty  The patient tolerated the procedure well and was transported to the PACU in stable condition.   The patient was doing well on POD 1. His right groin incision was clean and intact without hematoma. He had brisk biphasic PT and peroneal doppler signals on the right. He did complain of urinary frequency and UA and culture were ordered. Both were negative. He was ambulating well and pain well controlled. He was discharged home on POD 1 in good condition.   CBC    Component Value  Date/Time   WBC 6.8 10/26/2016 0018   RBC 4.18 (L) 10/26/2016 0018   HGB 11.1 (L) 10/26/2016 0018   HCT 34.8 (L) 10/26/2016 0018   PLT 167 10/26/2016 0018   MCV 83.3 10/26/2016 0018   MCH 26.6 10/26/2016 0018   MCHC 31.9 10/26/2016 0018   RDW 15.4 10/26/2016 0018   LYMPHSABS 0.9 10/01/2014 2029   MONOABS 1.1 (H) 10/01/2014 2029   EOSABS 0.3 10/01/2014 2029   BASOSABS 0.0 10/01/2014 2029    BMET    Component Value Date/Time   NA 135 10/26/2016 0018   K 4.0 10/26/2016 0018   CL 105 10/26/2016 0018   CO2 23 10/26/2016 0018   GLUCOSE 105 (H) 10/26/2016 0018   BUN 9 10/26/2016 0018   CREATININE 1.11 10/26/2016 0018   CREATININE 1.11 10/20/2015 1355   CALCIUM 9.2 10/26/2016 0018   GFRNONAA >60 10/26/2016 0018   GFRNONAA 72 10/20/2015 1355   GFRAA >60 10/26/2016 0018   GFRAA 84 10/20/2015 1355     Discharge Instructions:   The patient is discharged to home with extensive instructions on wound care and progressive ambulation.  They are instructed not to drive or perform any heavy lifting until returning to see the physician in his office.  Discharge Instructions    Call MD for:  redness, tenderness, or signs of infection (pain, swelling, bleeding, redness, odor or green/yellow discharge around incision site)    Complete by:  As directed    Call MD for:  severe or increased pain, loss or decreased feeling  in  affected limb(s)    Complete by:  As directed    Call MD for:  temperature >100.5    Complete by:  As directed    Discharge wound care:    Complete by:  As directed    Wash the groin wound with soap and water daily and pat dry. (No tub bath-only shower)  Then put a dry gauze or washcloth there to keep this area dry daily and as needed.  Do not use Vaseline or neosporin on your incisions.  Only use soap and water on your incisions and then protect and keep dry.   Driving Restrictions    Complete by:  As directed    No driving for 1 week and while on pain medication    Increase activity slowly    Complete by:  As directed    Walk with assistance use walker or cane as needed   Lifting restrictions    Complete by:  As directed    No lifting for 1 weeks   Resume previous diet    Complete by:  As directed       Discharge Diagnosis:  Peripheral Vascular Disease with Right Lower Extremity Claudication  I70.211 Bilateral External Iliac Artery Stenosis I77.1   Secondary Diagnosis: Patient Active Problem List   Diagnosis Date Noted  . Peripheral arterial disease ([place]) 10/25/2016  . Chronic neck pain 03/15/2016  . Chronic low back pain without sciatica 03/15/2016  . Nonallopathic lesion of lumbosacral region 03/15/2016  . Nonallopathic lesion of sacral region 03/15/2016  . Nonallopathic lesion of thoracic region 03/15/2016  . Liver fibrosis 12/18/2015  . Arteriovenous fistula thrombosis ([place]) 10/16/2015  . Chronic hepatitis C without hepatic coma ([place]) 09/03/2015  . Atherosclerosis of native arteries of extremities with intermittent claudication, right leg ([place]) 04/17/2015  . Pain in joint, lower leg 09/13/2014  . Hemorrhoids, internal, with bleeding 01/29/2014  . Pain in limb 08/17/2013  . Peripheral vascular disease, unspecified ([place]) 06/08/2013  . Redness of skin-Calf to foot LEFT 05/25/2013  . Tenderness in limb-Calf to foot LEFT 05/25/2013  . Swelling of limb-Calf to foot LEFT 05/25/2013  . Postoperative pain 05/02/2013  . PAD (peripheral artery disease) ([place]) 04/24/2013  . Preop cardiovascular exam 03/27/2013  . Essential hypertension 03/27/2013  . Hyperlipidemia 03/27/2013  . Atherosclerosis of native arteries of extremity with intermittent claudication ([place]) 02/16/2013  . Transplant recipient 02/16/2013   Past Medical History:  Diagnosis Date  . ADHD   . Anemia    pt. not sure, but reports he is taking Fe for one month now.  . Aorto-iliac disease ([place])   . Bladder dysfunction   . ESRD (end stage renal disease) [facility])    transplant-  [facility] , followed by Dr. Moshe Cipro   . GERD (gastroesophageal reflux disease)   . Hemodialysis patient [facility])    "on dialysis 1998-2010" 04/25/2013)  . Hepatitis C    has been treated with Harvoni  . History of renal failure   . Hypercholesteremia   . Hypertension   . Internal hemorrhoids    Colonoscopy 2010 and anoscopy 2015  . Peripheral vascular disease ([place])   . Pneumonia   . Self-catheterizes urinary bladder    pt. choice, but he reports that he desires to do this to avoid retention      Allergies as of 10/26/2016      Reactions   Tape Rash, Other (See Comments)   Silk tape      Medication List  TAKE these medications   aspirin 325 MG tablet Take 325 mg by mouth 2 (two) times daily.   citalopram 10 MG tablet Commonly known as:  CELEXA Take 10-20 mg by mouth daily. Takes 20mg  po every morning and 10mg  po daily at [facility]   HYDROcodone-acetaminophen 5-325 MG tablet Commonly known as:  NORCO Take 1 tablet by mouth every 6 (six) hours as needed for moderate pain.   lisdexamfetamine 20 MG capsule Commonly known as:  VYVANSE Take 20 mg by mouth 2 (two) times daily. Takes 20mg  po every morning and 20mg  po daily at [facility].   magnesium oxide 400 MG tablet Commonly known as:  MAG-OX Take 400 mg by mouth 2 (two) times daily.   MULTI-VITAMIN PO Take 1 tablet by mouth at bedtime.   mycophenolate 250 MG capsule Commonly known as:  CELLCEPT Take 250-500 mg by mouth See admin instructions. 500 mg in the morning and 250 mg in the evening   NIFEdipine 30 MG 24 hr tablet Commonly known as:  PROCARDIA-XL/ADALAT-CC/NIFEDICAL-XL Take 30 mg by mouth daily.   omeprazole 20 MG capsule Commonly known as:  PRILOSEC Take 20 mg by mouth daily. Reported on 06/06/2015   pregabalin 50 MG capsule Commonly known as:  LYRICA Take 1 capsule (50 mg total) by mouth 3 (three) times daily.   rosuvastatin 5 MG tablet Commonly known as:  CRESTOR Take 5 mg by mouth at  bedtime.   sodium chloride 0.65 % Soln nasal spray Commonly known as:  OCEAN Place 1 spray into both nostrils as needed (swelling).   tacrolimus 1 MG capsule Commonly known as:  PROGRAF Take 3 mg by mouth 2 (two) times daily.   tadalafil 20 MG tablet Commonly known as:  CIALIS Take 20 mg by mouth daily as needed for erectile dysfunction.   tamsulosin 0.4 MG Caps capsule Commonly known as:  FLOMAX Take 0.4 mg by mouth daily.   VITAMIN B-12 PO Take 1 tablet by mouth daily.   VITAMIN B-6 PO Take 1 tablet by mouth daily.   Vitamin D3 5000 units Caps Take 5,000 Units by mouth 2 (two) times daily.   zolpidem 10 MG tablet Commonly known as:  AMBIEN Take 10 mg by mouth at bedtime as needed for sleep.       Hydrocodone #20 No Refill  Disposition: Home  Patient's condition: is Good  Follow up: 1. Dr. Bridgett Larsson in 2 weeks   Virgina Jock, PA-C [facility] [phone] 10/28/2016  3:15 PM  Addendum  Pt underwent right iliofemoral endarterectomy with bovine patch angioplasty for >75% distal external iliac artery stenosis compromising right leg blood flow into his right femoropopliteal bypass.  Pt's case was uneventful as was his post-operative course.  The patient will be seen in the office in 2 weeks for wound check.  [name], MD, FACS [facility] [place] Office: [phone] Pager: [phone]  10/29/2016, 8:27 AM   - For VQI Registry use --- Instructions: Press F2 to tab through selections.  Delete question if not applicable.   Post-op:  Wound infection: No  Graft infection: No  Transfusion: No  New Arrhythmia: No Ipsilateral amputation: No, [ ]  Minor, [ ]  BKA, [ ]  AKA D/C Ambulatory Status: Ambulatory  Complications: MI: No, [ ]  Troponin only, [ ]  EKG or Clinical CHF: No Resp failure:No, [ ]  Pneumonia, [ ]  Ventilator Chg in renal function: No, [ ]  Inc. Cr > 0.5, [ ]  Temp. Dialysis, [ ]  Permanent  dialysis  Stroke: No, [ ]  Minor, [ ]  Major Return to OR: No  Reason for return to OR: [ ]  Bleeding, [ ]  Infection, [ ]  Thrombosis, [ ]  Revision  Discharge medications: Statin use:  yes ASA use:  yes Plavix use:  no Beta blocker use: no Coumadin use: no

## 2016-11-02 ENCOUNTER — Telehealth: Payer: Self-pay | Admitting: Vascular Surgery

## 2016-11-02 NOTE — Telephone Encounter (Signed)
-----   Message from Mena Goes, RN sent at 10/26/2016 12:14 PM EDT ----- Regarding: ABI also   ----- Message ----- From: Alvia Grove, PA-C Sent: 10/26/2016  11:33 AM To: Vvs Charge Pool  Please add ABIs to patient's appointment with Dr. Bridgett Larsson in 2 weeks.  Thanks Maudie Mercury

## 2016-11-02 NOTE — Telephone Encounter (Signed)
Sched appt 11/19/16 at 9:30. Spoke to pt to confirm appt.

## 2016-11-09 DIAGNOSIS — S92505A Nondisplaced unspecified fracture of left lesser toe(s), initial encounter for closed fracture: Secondary | ICD-10-CM | POA: Diagnosis not present

## 2016-11-10 ENCOUNTER — Encounter: Payer: Self-pay | Admitting: Vascular Surgery

## 2016-11-11 NOTE — Progress Notes (Signed)
    Postoperative Visit   History of Present Illness   Scott Chavez is a 61 y.o. year old male who presents for postoperative follow-up for: redo R fem exposure, R iliofemoral EA w/ BPA (10/25/16).  The patient's right recently sealed but he has a residual "bump" in right groin.  The patient notes improvement in RLE sx.  The patient is able to complete their activities of daily living.  The patient's current symptoms are: aching with manipulation of R groin.   Physical Examination   Vitals:   11/19/16 0941 11/19/16 0944  BP: (!) 150/81 (!) 148/84  Pulse: 66   Resp: 18   Temp: 98.3 F (36.8 C)   TempSrc: Oral   SpO2: 100%   Weight: 156 lb 11.2 oz (71.1 kg)   Height: 5\' 10"  (1.778 m)     RLE: R groin incision is healed with palpable mass in subcutaneous tissue, non-ballotable without any pulsatile character, on Sonosite: mass appears to be solid and hemogenous, 1+ edema, faintly palpable DP, easily palpable R femoral pulse   Medical Decision Making   Scott Chavez is a 61 y.o. year old male who presents s/p Redo right femoral exposure, R iliofemoral EA w/ BPA.   The patient's bypass incisions are healing appropriately with improvement of pre-operative symptoms.  Unclear if mass in right groin is hematoma vs scar tissue.  I don't see any advantage to surgical exploration in this immunosuppressed patient.  We discuss conservative mgmt for now.  The notes some discomfort in R groin with activity, so I have ordered a lidocaine patch for use in this area daily have washing off the right groin. I discussed in depth with the patient the nature of atherosclerosis, and emphasized the importance of maximal medical management including strict control of blood pressure, blood glucose, and lipid levels, obtaining regular exercise, and cessation of smoking.  The patient is aware that without maximal medical management the underlying atherosclerotic disease process will progress, limiting  the benefit of any interventions. The patient's surveillance will included ABI and bypass duplex studies which will be completed in: 2 months, at which time the patient will be re-evaluated.   I emphasized the importance of routine surveillance of the patient's bypass, as the vascular surgery literature emphasize the improved patency possible with assisted primary patency procedures versus secondary patency procedures. The patient agrees to participate in their maximal medical care and routine surveillance. Thank you for allowing [place] to participate in this patient's care.   [name], MD, FACS [facility] [place] Office: [phone] Pager: [phone]

## 2016-11-19 ENCOUNTER — Encounter: Payer: Self-pay | Admitting: Vascular Surgery

## 2016-11-19 ENCOUNTER — Ambulatory Visit (INDEPENDENT_AMBULATORY_CARE_PROVIDER_SITE_OTHER): Payer: Self-pay | Admitting: Vascular Surgery

## 2016-11-19 VITALS — BP 148/84 | HR 66 | Temp 98.3°F | Resp 18 | Ht 70.0 in | Wt 156.7 lb

## 2016-11-19 DIAGNOSIS — I70211 Atherosclerosis of native arteries of extremities with intermittent claudication, right leg: Secondary | ICD-10-CM

## 2016-11-19 MED ORDER — LIDOCAINE 5 % EX PTCH
1.0000 | MEDICATED_PATCH | CUTANEOUS | 0 refills | Status: DC
Start: 2016-11-19 — End: 2019-05-18

## 2016-11-24 DIAGNOSIS — I129 Hypertensive chronic kidney disease with stage 1 through stage 4 chronic kidney disease, or unspecified chronic kidney disease: Secondary | ICD-10-CM | POA: Diagnosis not present

## 2016-11-24 DIAGNOSIS — R399 Unspecified symptoms and signs involving the genitourinary system: Secondary | ICD-10-CM | POA: Diagnosis not present

## 2016-11-24 DIAGNOSIS — Z8619 Personal history of other infectious and parasitic diseases: Secondary | ICD-10-CM | POA: Diagnosis not present

## 2016-11-24 DIAGNOSIS — Z940 Kidney transplant status: Secondary | ICD-10-CM | POA: Diagnosis not present

## 2016-11-24 DIAGNOSIS — E7800 Pure hypercholesterolemia, unspecified: Secondary | ICD-10-CM | POA: Diagnosis not present

## 2016-12-02 ENCOUNTER — Other Ambulatory Visit: Payer: Self-pay

## 2016-12-13 DIAGNOSIS — N390 Urinary tract infection, site not specified: Secondary | ICD-10-CM | POA: Diagnosis not present

## 2016-12-13 DIAGNOSIS — Z940 Kidney transplant status: Secondary | ICD-10-CM | POA: Diagnosis not present

## 2016-12-13 DIAGNOSIS — E7800 Pure hypercholesterolemia, unspecified: Secondary | ICD-10-CM | POA: Diagnosis not present

## 2016-12-29 DIAGNOSIS — E785 Hyperlipidemia, unspecified: Secondary | ICD-10-CM | POA: Diagnosis not present

## 2016-12-29 DIAGNOSIS — Z940 Kidney transplant status: Secondary | ICD-10-CM | POA: Diagnosis not present

## 2016-12-29 DIAGNOSIS — R7303 Prediabetes: Secondary | ICD-10-CM | POA: Diagnosis not present

## 2016-12-29 DIAGNOSIS — K219 Gastro-esophageal reflux disease without esophagitis: Secondary | ICD-10-CM | POA: Diagnosis not present

## 2016-12-29 DIAGNOSIS — I10 Essential (primary) hypertension: Secondary | ICD-10-CM | POA: Diagnosis not present

## 2016-12-29 DIAGNOSIS — G4700 Insomnia, unspecified: Secondary | ICD-10-CM | POA: Diagnosis not present

## 2017-01-11 DIAGNOSIS — G4700 Insomnia, unspecified: Secondary | ICD-10-CM | POA: Diagnosis not present

## 2017-01-11 DIAGNOSIS — E785 Hyperlipidemia, unspecified: Secondary | ICD-10-CM | POA: Diagnosis not present

## 2017-01-11 DIAGNOSIS — Z940 Kidney transplant status: Secondary | ICD-10-CM | POA: Diagnosis not present

## 2017-01-11 DIAGNOSIS — I10 Essential (primary) hypertension: Secondary | ICD-10-CM | POA: Diagnosis not present

## 2017-01-19 DIAGNOSIS — R339 Retention of urine, unspecified: Secondary | ICD-10-CM | POA: Diagnosis not present

## 2017-01-19 NOTE — Progress Notes (Signed)
Established Intermittent Claudication   History of Present Illness   Scott Chavez is a 61 y.o. (December 09, 1955) male who presents with chief complaint: bilateral legs feel more "plump".    Previous operation(s) include:   1. L iliofem EA w/ BPA, L CFA to BK pop BPG with NR ips GSV (04/24/13) 2. PTA+S R CIA and L CIA (10/25/13) 3. OA+PTA R F-P (12/13/13) (ERROR in op note in laterality) 4. R iliofem EA w/ BPA, extended R profundaplasty, R CFA to BK pop BPG w/ NR ips GSV (04/17/15) 5. Excision of thrombosed L RC AVF (09/24/15) 6.  Redo R fem exposure, R iliofem EA w/ BPA (10/25/16)  The patient's symptoms have not progressed.  The patient's symptoms are: swelling.  His intermittent claudication sx are stable and minimal.  The patient's treatment regimen currently included: maximal medical management.  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  Left EIA has known stenosis, but not tight enough to merit immediate intervention.  The patient's PMH, PSH, and SH, and FamHx are unchanged from 11/19/16.  Current Outpatient Prescriptions  Medication Sig Dispense Refill  . aspirin 325 MG tablet Take 325 mg by mouth 2 (two) times daily.     . Cholecalciferol (VITAMIN D3) 5000 units CAPS Take 5,000 Units by mouth 2 (two) times daily.    . citalopram (CELEXA) 10 MG tablet Take 10-20 mg by mouth daily. Takes 20mg  po every morning and 10mg  po daily at [facility]    . Cyanocobalamin (VITAMIN B-12 PO) Take 1 tablet by mouth daily.    Marland Kitchen HYDROcodone-acetaminophen (NORCO) 5-325 MG tablet Take 1 tablet by mouth every 6 (six) hours as needed for moderate pain. 20 tablet 0  . lidocaine (LIDODERM) 5 % Place 1 patch onto the skin daily. Remove & Discard patch within 12 hours or as directed by MD 30 patch 0  . lisdexamfetamine (VYVANSE) 20 MG capsule Take 20 mg by mouth 2 (two) times daily. Takes 20mg  po every morning and 20mg  po daily at [facility].    . magnesium oxide (MAG-OX) 400 MG tablet Take 400 mg by mouth 2 (two)  times daily.     . Multiple Vitamin (MULTI-VITAMIN PO) Take 1 tablet by mouth at bedtime.     . mycophenolate (CELLCEPT) 250 MG capsule Take 250-500 mg by mouth See admin instructions. 500 mg in the morning and 250 mg in the evening    . NIFEdipine (PROCARDIA-XL/ADALAT-CC/NIFEDICAL-XL) 30 MG 24 hr tablet Take 30 mg by mouth daily.     Marland Kitchen omeprazole (PRILOSEC) 20 MG capsule Take 20 mg by mouth daily. Reported on 06/06/2015    . pregabalin (LYRICA) 50 MG capsule Take 1 capsule (50 mg total) by mouth 3 (three) times daily. 30 capsule 5  . Pyridoxine HCl (VITAMIN B-6 PO) Take 1 tablet by mouth daily.    . rosuvastatin (CRESTOR) 5 MG tablet Take 5 mg by mouth at bedtime.     . sodium chloride (OCEAN) 0.65 % SOLN nasal spray Place 1 spray into both nostrils as needed (swelling). 30 mL 0  . tacrolimus (PROGRAF) 1 MG capsule Take 3 mg by mouth 2 (two) times daily.     . tadalafil (CIALIS) 20 MG tablet Take 20 mg by mouth daily as needed for erectile dysfunction.    . tamsulosin (FLOMAX) 0.4 MG CAPS capsule Take 0.4 mg by mouth daily.    Marland Kitchen zolpidem (AMBIEN) 10 MG tablet Take 10 mg by mouth at bedtime as needed for  sleep.     No current facility-administered medications for this visit.     On ROS today: bilateral leg more edematous, no rest pain.   Physical Examination   Vitals:   01/21/17 0947  BP: (!) 161/89  Pulse: 72  Resp: 20  Temp: (!) 97.1 F (36.2 C)  SpO2: 97%  Weight: 162 lb (73.5 kg)  Height: 5\' 10"  (1.778 m)   Body mass index is 23.24 kg/m.  General Alert, O x 3, WD, NAD  Pulmonary Sym exp, good B air movt, CTA B  Cardiac RRR, Nl S1, S2, no Murmurs, No rubs, No S3,S4  Vascular Vessel Right Left  Radial Palpable Palpable  Brachial Palpable Palpable  Carotid Palpable, No Bruit Palpable, No Bruit  Aorta Not palpable N/A  Femoral Palpable Palpable  Popliteal Not palpable Not palpable  PT Not palpable Not palpable  DP Faintly palpable Faintly palpable    Gastro- intestinal  soft, non-distended, non-tender to palpation, No guarding or rebound, no HSM, no masses, no CVAT B, No palpable prominent aortic pulse,    Musculo- skeletal M/S 5/5 throughout  , Extremities without ischemic changes  , No edema present, compression stocks and socks on  Neurologic Pain and light touch intact in extremities, Motor exam as listed above    Non-Invasive Vascular imaging   Not done.  Ordered   Medical Decision Making   Scott Chavez is a 61 y.o. male who presents with:  resolved BLE intermittent claudication, s/p B CIA PTA+S, B fem-pop bypasses with GSV, s/p R iliofemoral EA w/ BPA, CKT, B distal EIA stenoses (R>L), L peroneal artery stenosis   Based on the patient's vascular studies and examination, I have offered the patient: BLE ABI, B arterial duplexes, and aortoiliac duplex.  He is going to be scheduled in the next 2-4 weeks.   I discussed in depth with the patient the nature of atherosclerosis, and emphasized the importance of maximal medical management including strict control of blood pressure, blood glucose, and lipid levels, antiplatelet agents, obtaining regular exercise, and cessation of smoking.    The patient is aware that without maximal medical management the underlying atherosclerotic disease process will progress, limiting the benefit of any interventions. The patient is currently on a statin: Crestor.  The patient is currently on an anti-platelet: ASA.  Thank you for allowing [place] to participate in this patient's care.   [name], MD, FACS [facility] [place] Office: [phone] Pager: [phone]

## 2017-01-21 ENCOUNTER — Encounter: Payer: Self-pay | Admitting: Vascular Surgery

## 2017-01-21 ENCOUNTER — Ambulatory Visit (INDEPENDENT_AMBULATORY_CARE_PROVIDER_SITE_OTHER): Payer: Medicare Other | Admitting: Vascular Surgery

## 2017-01-21 VITALS — BP 161/89 | HR 72 | Temp 97.1°F | Resp 20 | Ht 70.0 in | Wt 162.0 lb

## 2017-01-21 DIAGNOSIS — I70212 Atherosclerosis of native arteries of extremities with intermittent claudication, left leg: Secondary | ICD-10-CM | POA: Diagnosis not present

## 2017-01-21 DIAGNOSIS — I70211 Atherosclerosis of native arteries of extremities with intermittent claudication, right leg: Secondary | ICD-10-CM

## 2017-01-21 DIAGNOSIS — R2243 Localized swelling, mass and lump, lower limb, bilateral: Secondary | ICD-10-CM

## 2017-01-21 DIAGNOSIS — N390 Urinary tract infection, site not specified: Secondary | ICD-10-CM | POA: Diagnosis not present

## 2017-01-24 ENCOUNTER — Other Ambulatory Visit: Payer: Self-pay

## 2017-01-24 DIAGNOSIS — I70211 Atherosclerosis of native arteries of extremities with intermittent claudication, right leg: Secondary | ICD-10-CM

## 2017-01-24 DIAGNOSIS — I739 Peripheral vascular disease, unspecified: Secondary | ICD-10-CM

## 2017-01-27 ENCOUNTER — Ambulatory Visit (HOSPITAL_COMMUNITY)
Admission: RE | Admit: 2017-01-27 | Discharge: 2017-01-27 | Disposition: A | Discharge status: Home / Self Care | Payer: Medicare Other | Source: Ambulatory Visit | Attending: Vascular Surgery | Admitting: Vascular Surgery

## 2017-01-27 DIAGNOSIS — I739 Peripheral vascular disease, unspecified: Secondary | ICD-10-CM | POA: Insufficient documentation

## 2017-01-27 DIAGNOSIS — Z95828 Presence of other vascular implants and grafts: Secondary | ICD-10-CM | POA: Insufficient documentation

## 2017-01-27 DIAGNOSIS — I70211 Atherosclerosis of native arteries of extremities with intermittent claudication, right leg: Secondary | ICD-10-CM

## 2017-02-01 NOTE — Progress Notes (Deleted)
Established Intermittent Claudication   History of Present Illness   Scott Chavez is a 61 y.o. (1956/01/01) male who presents with chief complaint: bilateral legs feel more "plump".    Previous operation(s) include:   1. L iliofem EA w/ BPA, L CFA to BK pop BPG with NR ips GSV (04/24/13) 2. PTA+S R CIA and L CIA (10/25/13) 3. OA+PTA R F-P (12/13/13) (ERROR in op note in laterality) 4. R iliofem EA w/ BPA, extended R profundaplasty, R CFA to BK pop BPG w/ NR ips GSV (04/17/15) 5. Excision of thrombosed L RC AVF (09/24/15) 6.  Redo R fem exposure, R iliofem EA w/ BPA (10/25/16)  The patient's symptoms have not progressed.  The patient's symptoms are: swelling.  His intermittent claudication sx are stable and minimal.  The patient's treatment regimen currently included: maximal medical management.  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  Left EIA has known stenosis, but not tight enough to merit immediate intervention.  The patient's PMH, PSH, and SH, and FamHx are unchanged from 01/21/17.  Current Outpatient Prescriptions  Medication Sig Dispense Refill  . aspirin 325 MG tablet Take 325 mg by mouth 2 (two) times daily.     . Cholecalciferol (VITAMIN D3) 5000 units CAPS Take 5,000 Units by mouth 2 (two) times daily.    . citalopram (CELEXA) 10 MG tablet Take 10-20 mg by mouth daily. Takes 20mg  po every morning and 10mg  po daily at [facility]    . Cyanocobalamin (VITAMIN B-12 PO) Take 1 tablet by mouth daily.    Marland Kitchen HYDROcodone-acetaminophen (NORCO) 5-325 MG tablet Take 1 tablet by mouth every 6 (six) hours as needed for moderate pain. 20 tablet 0  . lidocaine (LIDODERM) 5 % Place 1 patch onto the skin daily. Remove & Discard patch within 12 hours or as directed by MD 30 patch 0  . lisdexamfetamine (VYVANSE) 20 MG capsule Take 20 mg by mouth 2 (two) times daily. Takes 20mg  po every morning and 20mg  po daily at [facility].    . magnesium oxide (MAG-OX) 400 MG tablet Take 400 mg by mouth  2 (two) times daily.     . Multiple Vitamin (MULTI-VITAMIN PO) Take 1 tablet by mouth at bedtime.     . mycophenolate (CELLCEPT) 250 MG capsule Take 250-500 mg by mouth See admin instructions. 500 mg in the morning and 250 mg in the evening    . NIFEdipine (PROCARDIA-XL/ADALAT-CC/NIFEDICAL-XL) 30 MG 24 hr tablet Take 30 mg by mouth daily.     Marland Kitchen omeprazole (PRILOSEC) 20 MG capsule Take 20 mg by mouth daily. Reported on 06/06/2015    . pregabalin (LYRICA) 50 MG capsule Take 1 capsule (50 mg total) by mouth 3 (three) times daily. 30 capsule 5  . Pyridoxine HCl (VITAMIN B-6 PO) Take 1 tablet by mouth daily.    . rosuvastatin (CRESTOR) 5 MG tablet Take 5 mg by mouth at bedtime.     . sodium chloride (OCEAN) 0.65 % SOLN nasal spray Place 1 spray into both nostrils as needed (swelling). 30 mL 0  . tacrolimus (PROGRAF) 1 MG capsule Take 3 mg by mouth 2 (two) times daily.     . tadalafil (CIALIS) 20 MG tablet Take 20 mg by mouth daily as needed for erectile dysfunction.    . tamsulosin (FLOMAX) 0.4 MG CAPS capsule Take 0.4 mg by mouth daily.    Marland Kitchen zolpidem (AMBIEN) 10 MG tablet Take 10 mg by mouth at bedtime as needed for  sleep.     No current facility-administered medications for this visit.    On ROS today: ***, ***   Physical Examination  ***There were no vitals filed for this visit. ***There is no height or weight on file to calculate BMI.  General Alert, O x 3, WD, NAD  Pulmonary Sym exp, good B air movt, CTA B  Cardiac RRR, Nl S1, S2, no Murmurs, No rubs, No S3,S4  Vascular Vessel Right Left  Radial Palpable Palpable  Brachial Palpable Palpable  Carotid Palpable, No Bruit Palpable, No Bruit  Aorta Not palpable N/A  Femoral Palpable Palpable  Popliteal Not palpable Not palpable  PT Not palpable Not palpable  DP Faintly palpable Faintly palpable    Gastro- intestinal soft, non-distended, non-tender to palpation, No guarding or rebound, no HSM, no masses, no CVAT B, No palpable  prominent aortic pulse,    Musculo- skeletal M/S 5/5 throughout  , Extremities without ischemic changes  , No edema present, compression stocks and socks on  Neurologic Pain and light touch intact in extremities, Motor exam as listed above    Non-invasive Vascular Imaging   ABI (***)  R:   ABI: *** (***),   PT: {Signals:19197::"none","mono","bi","tri"}  DP: {Signals:19197::"none","mono","bi","tri"}  TBI:  ***  L:   ABI: *** (***),   PT: {Signals:19197::"none","mono","bi","tri"}  DP: {Signals:19197::"none","mono","bi","tri"}  TBI: ***   Medical Decision Making   WYMAN MESCHKE is a 61 y.o. (Oct 22, 1955) male  who presents with:  resolved BLE intermittent claudication, s/p B CIA PTA+S, B fem-pop bypasses with GSV, s/p R iliofemoral EA w/ BPA, CKT, B distal EIA stenoses (R>L), L peroneal artery stenosis   ***  Based on the patient's vascular studies and examination, I have offered the patient: BLE ABI, B arterial duplexes, and aortoiliac duplex.  He is going to be scheduled in the next 2-4 weeks.   I discussed in depth with the patient the nature of atherosclerosis, and emphasized the importance of maximal medical management including strict control of blood pressure, blood glucose, and lipid levels, antiplatelet agents, obtaining regular exercise, and cessation of smoking.    The patient is aware that without maximal medical management the underlying atherosclerotic disease process will progress, limiting the benefit of any interventions.  The patient is currently on a statin: Crestor.   The patient is currently on an anti-platelet: ASA.  Thank you for allowing [place] to participate in this patient's care.   [name], MD, FACS [facility] [place] Office: [phone] Pager: [phone]

## 2017-02-02 ENCOUNTER — Encounter: Payer: Self-pay | Admitting: Vascular Surgery

## 2017-02-04 ENCOUNTER — Ambulatory Visit: Payer: Medicare Other | Admitting: Vascular Surgery

## 2017-02-04 ENCOUNTER — Ambulatory Visit (HOSPITAL_COMMUNITY)
Admission: RE | Admit: 2017-02-04 | Discharge: 2017-02-04 | Disposition: A | Discharge status: Home / Self Care | Payer: Medicare Other | Source: Ambulatory Visit | Attending: Vascular Surgery | Admitting: Vascular Surgery

## 2017-02-04 DIAGNOSIS — Z48812 Encounter for surgical aftercare following surgery on the circulatory system: Secondary | ICD-10-CM | POA: Diagnosis not present

## 2017-02-04 DIAGNOSIS — Z95828 Presence of other vascular implants and grafts: Secondary | ICD-10-CM | POA: Diagnosis not present

## 2017-02-04 DIAGNOSIS — I739 Peripheral vascular disease, unspecified: Secondary | ICD-10-CM

## 2017-02-04 DIAGNOSIS — I70211 Atherosclerosis of native arteries of extremities with intermittent claudication, right leg: Secondary | ICD-10-CM | POA: Diagnosis not present

## 2017-02-10 NOTE — Progress Notes (Deleted)
Established Intermittent Claudication   History of Present Illness   Scott Chavez is a 61 y.o. (1955/09/01) male who presents with chief complaint: ***  Previous operation(s) include:   1. L iliofem EA w/ BPA, L CFA to BK pop BPG with NR ips GSV (04/24/13) 2. PTA+S R CIA and L CIA (10/25/13) 3. OA+PTA R F-P (12/13/13) (ERROR in op note in laterality) 4. R iliofem EA w/ BPA, extended R profundaplasty, R CFA to BK pop BPG w/ NR ips GSV (04/17/15) 5. Excision of thrombosed L RC AVF (09/24/15) 6.  Redo R fem exposure, R iliofem EA w/ BPA (10/25/16)  The patient's symptoms have not progressed.  The patient's symptoms are: swelling.  His intermittent claudication sx are stable and minimal.  The patient's treatment regimen currently included: maximal medical management.  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  Left EIA has known stenosis, but not tight enough to merit immediate intervention.  Pt returns today for routine surveillance studies that have not been done.  The patient's PMH, PSH, and SH, and FamHx are unchanged from 01/21/17.  Current Outpatient Prescriptions  Medication Sig Dispense Refill  . aspirin 325 MG tablet Take 325 mg by mouth 2 (two) times daily.     . Cholecalciferol (VITAMIN D3) 5000 units CAPS Take 5,000 Units by mouth 2 (two) times daily.    . citalopram (CELEXA) 10 MG tablet Take 10-20 mg by mouth daily. Takes 20mg  po every morning and 10mg  po daily at [facility]    . Cyanocobalamin (VITAMIN B-12 PO) Take 1 tablet by mouth daily.    Marland Kitchen HYDROcodone-acetaminophen (NORCO) 5-325 MG tablet Take 1 tablet by mouth every 6 (six) hours as needed for moderate pain. 20 tablet 0  . lidocaine (LIDODERM) 5 % Place 1 patch onto the skin daily. Remove & Discard patch within 12 hours or as directed by MD 30 patch 0  . lisdexamfetamine (VYVANSE) 20 MG capsule Take 20 mg by mouth 2 (two) times daily. Takes 20mg  po every morning and 20mg  po daily at [facility].    . magnesium  oxide (MAG-OX) 400 MG tablet Take 400 mg by mouth 2 (two) times daily.     . Multiple Vitamin (MULTI-VITAMIN PO) Take 1 tablet by mouth at bedtime.     . mycophenolate (CELLCEPT) 250 MG capsule Take 250-500 mg by mouth See admin instructions. 500 mg in the morning and 250 mg in the evening    . NIFEdipine (PROCARDIA-XL/ADALAT-CC/NIFEDICAL-XL) 30 MG 24 hr tablet Take 30 mg by mouth daily.     Marland Kitchen omeprazole (PRILOSEC) 20 MG capsule Take 20 mg by mouth daily. Reported on 06/06/2015    . pregabalin (LYRICA) 50 MG capsule Take 1 capsule (50 mg total) by mouth 3 (three) times daily. 30 capsule 5  . Pyridoxine HCl (VITAMIN B-6 PO) Take 1 tablet by mouth daily.    . rosuvastatin (CRESTOR) 5 MG tablet Take 5 mg by mouth at bedtime.     . sodium chloride (OCEAN) 0.65 % SOLN nasal spray Place 1 spray into both nostrils as needed (swelling). 30 mL 0  . tacrolimus (PROGRAF) 1 MG capsule Take 3 mg by mouth 2 (two) times daily.     . tadalafil (CIALIS) 20 MG tablet Take 20 mg by mouth daily as needed for erectile dysfunction.    . tamsulosin (FLOMAX) 0.4 MG CAPS capsule Take 0.4 mg by mouth daily.    Marland Kitchen zolpidem (AMBIEN) 10 MG tablet Take 10 mg  by mouth at bedtime as needed for sleep.     No current facility-administered medications for this visit.    On ROS today: bilateral leg more edematous, no rest pain.   Physical Examination  ***There were no vitals filed for this visit. ***There is no height or weight on file to calculate BMI.  General Alert, O x 3, WD, NAD  Pulmonary Sym exp, good B air movt, CTA B  Cardiac RRR, Nl S1, S2, no Murmurs, No rubs, No S3,S4  Vascular Vessel Right Left  Radial Palpable Palpable  Brachial Palpable Palpable  Carotid Palpable, No Bruit Palpable, No Bruit  Aorta Not palpable N/A  Femoral Palpable Palpable  Popliteal Not palpable Not palpable  PT Not palpable Not palpable  DP Faintly palpable Faintly palpable    Gastro- intestinal soft, non-distended, non-tender  to palpation, No guarding or rebound, no HSM, no masses, no CVAT B, No palpable prominent aortic pulse,    Musculo- skeletal M/S 5/5 throughout  , Extremities without ischemic changes  , No edema present, compression stocks and socks on  Neurologic Pain and light touch intact in extremities, Motor exam as listed above    Non-invasive Vascular Imaging   ABI (01/27/17)  R:   ABI: 1.08 (0.86),   PT: tri  DP: mono  TBI:  0.72  L:   ABI: 0.91 (0.94),   PT: mono  DP: bi  TBI: 0.60  Aortoiliac Duplex(01/27/17)  Ao: 87 c/s (mono)  R iliac: 212-234c/s (in-stent),   L iliac: 93-97 c/s (in-stent)  B Bypass Duplex(02/04/17)  R:  Inflow: 42 c/s  Prox anastomosis: 138 c/s  Graft: 49-112 c/s  Distal anastomosis: 183 c/s  Outflow: 91 c/s  L:  Inflow: 141 c/s  Prox anastomosis: 38 c/s  Graft: 44-48 c/s  Distal anastomosis: 78 c/s  Outflow: 47 c/s   Medical Decision Making   Scott Chavez is a 61 y.o. male who presents with:  resolved BLE intermittent claudication, s/p B CIA PTA+S, B fem-pop bypasses with GSV, s/p R iliofemoral EA w/ BPA, CKT, B distal EIA stenoses (R>L), L peroneal artery stenosis   ***Left femoropopliteal velocities are concerning for possible inflow stenosis.  Recommend: Left runoff, pelvic angiogram, possible intervention.  I discussed in depth with the patient the nature of atherosclerosis, and emphasized the importance of maximal medical management including strict control of blood pressure, blood glucose, and lipid levels, antiplatelet agents, obtaining regular exercise, and cessation of smoking.    The patient is aware that without maximal medical management the underlying atherosclerotic disease process will progress, limiting the benefit of any interventions.  The patient is currently on a statin: Crestor.   The patient is currently on an anti-platelet: ASA.  Thank you for allowing [place] to participate in this patient's  care.   [name], MD, FACS [facility] [place] Office: [phone] Pager: [phone]

## 2017-02-11 ENCOUNTER — Ambulatory Visit: Payer: Medicare Other | Admitting: Vascular Surgery

## 2017-02-25 ENCOUNTER — Encounter: Payer: Self-pay | Admitting: Vascular Surgery

## 2017-02-25 ENCOUNTER — Ambulatory Visit (INDEPENDENT_AMBULATORY_CARE_PROVIDER_SITE_OTHER): Payer: Medicare Other | Admitting: Vascular Surgery

## 2017-02-25 VITALS — BP 140/77 | HR 83 | Temp 97.3°F | Resp 18 | Ht 70.0 in | Wt 162.0 lb

## 2017-02-25 DIAGNOSIS — I70213 Atherosclerosis of native arteries of extremities with intermittent claudication, bilateral legs: Secondary | ICD-10-CM

## 2017-02-25 NOTE — Progress Notes (Signed)
Established Intermittent Claudication   History of Present Illness   Scott Chavez is a 61 y.o. (1956/03/25) male who presents with chief complaint: improved walking.    Previous operation(s) include:   1. L iliofem EA w/ BPA, L CFA to BK pop BPG with NR ips GSV (04/24/13) 2. PTA+S R CIA and L CIA (10/25/13) 3. OA+PTA R F-P (12/13/13) (ERROR in op note in laterality) 4. R iliofem EA w/ BPA, extended R profundaplasty, R CFA to BK pop BPG w/ NR ips GSV (04/17/15) 5. Excision of thrombosed L RC AVF (09/24/15) 6.  Redo R fem exposure, R iliofem EA w/ BPA (10/25/16)  The patient's symptoms have not progressed.  The patient's symptoms are: mild intermittent claudication and L>R leg swelling.  This is markedly better per patient has he has been walking due to his car breaking down.  The patient's treatment regimen currently included: maximal medical management and walking plan.  The patient's PMH, PSH, and SH, and FamHx are unchanged from 01/21/17.  Current Outpatient Prescriptions  Medication Sig Dispense Refill  . aspirin 325 MG tablet Take 325 mg by mouth 2 (two) times daily.     . Cholecalciferol (VITAMIN D3) 5000 units CAPS Take 5,000 Units by mouth 2 (two) times daily.    . citalopram (CELEXA) 10 MG tablet Take 10-20 mg by mouth daily. Takes 20mg  po every morning and 10mg  po daily at [facility]    . Cyanocobalamin (VITAMIN B-12 PO) Take 1 tablet by mouth daily.    Marland Kitchen HYDROcodone-acetaminophen (NORCO) 5-325 MG tablet Take 1 tablet by mouth every 6 (six) hours as needed for moderate pain. 20 tablet 0  . lidocaine (LIDODERM) 5 % Place 1 patch onto the skin daily. Remove & Discard patch within 12 hours or as directed by MD 30 patch 0  . lisdexamfetamine (VYVANSE) 20 MG capsule Take 20 mg by mouth 2 (two) times daily. Takes 20mg  po every morning and 20mg  po daily at [facility]    . magnesium oxide (MAG-OX) 400 MG tablet Take 400 mg by mouth 2 (two) times daily.     . Multiple Vitamin  (MULTI-VITAMIN PO) Take 1 tablet by mouth at bedtime.     . mycophenolate (CELLCEPT) 250 MG capsule Take 250-500 mg by mouth See admin instructions. 500 mg in the morning and 250 mg in the evening    . NIFEdipine (PROCARDIA-XL/ADALAT-CC/NIFEDICAL-XL) 30 MG 24 hr tablet Take 30 mg by mouth daily.     Marland Kitchen omeprazole (PRILOSEC) 20 MG capsule Take 20 mg by mouth daily. Reported on 06/06/2015    . pregabalin (LYRICA) 50 MG capsule Take 1 capsule (50 mg total) by mouth 3 (three) times daily. 30 capsule 5  . Pyridoxine HCl (VITAMIN B-6 PO) Take 1 tablet by mouth daily.    . rosuvastatin (CRESTOR) 5 MG tablet Take 5 mg by mouth at bedtime.     . sodium chloride (OCEAN) 0.65 % SOLN nasal spray Place 1 spray into both nostrils as needed (swelling). 30 mL 0  . tacrolimus (PROGRAF) 1 MG capsule Take 3 mg by mouth 2 (two) times daily.     . tadalafil (CIALIS) 20 MG tablet Take 20 mg by mouth daily as needed for erectile dysfunction.    . tamsulosin (FLOMAX) 0.4 MG CAPS capsule Take 0.4 mg by mouth daily.    Marland Kitchen zolpidem (AMBIEN) 10 MG tablet Take 10 mg by mouth at bedtime as needed for sleep.     No current facility-administered  medications for this visit.     On ROS today: improved intermittent claudication, no rest pain.   Physical Examination   Vitals:   02/25/17 0832  BP: 140/77  Pulse: 83  Resp: 18  Temp: (!) 97.3 F (36.3 C)  SpO2: 99%  Weight: 162 lb (73.5 kg)  Height: 5\' 10"  (1.778 m)   Body mass index is 23.24 kg/m.  General Alert, O x 3, WD, NAD  Pulmonary Sym exp, good B air movt, CTA B  Cardiac RRR, Nl S1, S2, no Murmurs, No rubs, No S3,S4  Vascular Vessel Right Left  Radial Palpable Palpable  Brachial Palpable Palpable  Carotid Palpable, No Bruit Palpable, No Bruit  Aorta Not palpable N/A  Femoral Palpable Palpable  Popliteal Not palpable Not palpable  PT Faintly palpable Not palpable  DP Not palpable Faintly palpable    Gastro- intestinal soft, non-distended, non-tender to  palpation, No guarding or rebound, no HSM, no masses, no CVAT B, No palpable prominent aortic pulse,    Musculo- skeletal M/S 5/5 throughout  , Extremities without ischemic changes  , No edema present, No visible varicosities , No Lipodermatosclerosis present  Neurologic Pain and light touch intact in extremities , Motor exam as listed above    Non-Invasive Vascular imaging   ABI (01/27/17)  R:   ABI: 1.02 (0.86),   PT: tri  DP: mono  TBI:  0.72  L:   ABI: 0.91 (0.94),   PT: mono  DP: bi  TBI: 0.60  Aortoiliac Duplex (01/27/17)  Ao: 87 c/s  R iliac: 209-234 c/s  L iliac: 93-97 c/s  Both stents patent  BLE Arterial Duplex (02/04/17)  R:  Inflow: 42 c/s (mono)  Bypass: 49-183 c/s (bi-tri)  Outflow: 91 c/s (mono)  L:  Inflow: 141 c/s (tri)  Bypass: 38-78 c/s (mono)  Outflow: 47 c/s (mono)   Medical Decision Making   Scott Chavez is a 61 y.o. male who presents with:  resolved BLE intermittent claudication, s/p B CIA PTA+S, B fem-pop bypasses with GSV, s/p R iliofemoral EA w/ BPA, CKT, B distal EIA stenoses (R>L), L peroneal artery stenosis   Intermittent claudication improved but residual L CVI after vein harvest.  On exam, minimal edema today so compression stocking appear adequate for control of his CVI.  Based on the patient's vascular studies and examination, I have offered the patient: q6 month ABI, BLE arterial duplex, and aortoiliac duplex.  I discussed in depth with the patient the nature of atherosclerosis, and emphasized the importance of maximal medical management including strict control of blood pressure, blood glucose, and lipid levels, antiplatelet agents, obtaining regular exercise, and cessation of smoking.    The patient is aware that without maximal medical management the underlying atherosclerotic disease process will progress, limiting the benefit of any interventions. The patient is currently on a statin: Crestor.  The patient  is currently on an anti-platelet: ASA.  Thank you for allowing [place] to participate in this patient's care.   [name], MD, FACS [facility] [place] Office: [phone] Pager: [phone]

## 2017-03-03 DIAGNOSIS — Z940 Kidney transplant status: Secondary | ICD-10-CM | POA: Diagnosis not present

## 2017-03-03 DIAGNOSIS — E7800 Pure hypercholesterolemia, unspecified: Secondary | ICD-10-CM | POA: Diagnosis not present

## 2017-03-03 DIAGNOSIS — N390 Urinary tract infection, site not specified: Secondary | ICD-10-CM | POA: Diagnosis not present

## 2017-03-14 DIAGNOSIS — R399 Unspecified symptoms and signs involving the genitourinary system: Secondary | ICD-10-CM | POA: Diagnosis not present

## 2017-03-17 ENCOUNTER — Telehealth: Payer: Self-pay

## 2017-03-17 NOTE — Telephone Encounter (Signed)
Called [facility] to request patient office notes and labs for upcoming visit with Dr. Lebron Conners tomorrow- 03/18/17. Staff member given fax# to fax records to [facility].

## 2017-03-18 ENCOUNTER — Ambulatory Visit (HOSPITAL_BASED_OUTPATIENT_CLINIC_OR_DEPARTMENT_OTHER): Payer: Medicare Other | Admitting: Hematology and Oncology

## 2017-03-18 ENCOUNTER — Other Ambulatory Visit: Payer: Self-pay

## 2017-03-18 ENCOUNTER — Ambulatory Visit (HOSPITAL_BASED_OUTPATIENT_CLINIC_OR_DEPARTMENT_OTHER): Payer: Medicare Other

## 2017-03-18 ENCOUNTER — Telehealth: Payer: Self-pay | Admitting: Hematology and Oncology

## 2017-03-18 ENCOUNTER — Encounter: Payer: Self-pay | Admitting: Hematology and Oncology

## 2017-03-18 VITALS — BP 140/84 | HR 89 | Temp 98.2°F | Resp 18 | Ht 70.0 in | Wt 165.9 lb

## 2017-03-18 DIAGNOSIS — D509 Iron deficiency anemia, unspecified: Secondary | ICD-10-CM

## 2017-03-18 DIAGNOSIS — Z940 Kidney transplant status: Secondary | ICD-10-CM

## 2017-03-18 DIAGNOSIS — K648 Other hemorrhoids: Secondary | ICD-10-CM

## 2017-03-18 LAB — IRON AND TIBC
%SAT: 6 % — AB (ref 20–55)
Iron: 20 ug/dL — ABNORMAL LOW (ref 42–163)
TIBC: 339 ug/dL (ref 202–409)
UIBC: 319 ug/dL (ref 117–376)

## 2017-03-18 LAB — CBC & DIFF AND RETIC
BASO%: 0.2 % (ref 0.0–2.0)
Basophils Absolute: 0 10*3/uL (ref 0.0–0.1)
EOS ABS: 0.1 10*3/uL (ref 0.0–0.5)
EOS%: 1.6 % (ref 0.0–7.0)
HCT: 36.2 % — ABNORMAL LOW (ref 38.4–49.9)
HEMOGLOBIN: 11.2 g/dL — AB (ref 13.0–17.1)
IMMATURE RETIC FRACT: 16.6 % — AB (ref 3.00–10.60)
LYMPH%: 14.9 % (ref 14.0–49.0)
MCH: 24.2 pg — ABNORMAL LOW (ref 27.2–33.4)
MCHC: 30.9 g/dL — ABNORMAL LOW (ref 32.0–36.0)
MCV: 78.2 fL — ABNORMAL LOW (ref 79.3–98.0)
MONO#: 0.4 10*3/uL (ref 0.1–0.9)
MONO%: 8.9 % (ref 0.0–14.0)
NEUT#: 3.2 10*3/uL (ref 1.5–6.5)
NEUT%: 74.4 % (ref 39.0–75.0)
PLATELETS: 212 10*3/uL (ref 140–400)
RBC: 4.63 10*6/uL (ref 4.20–5.82)
RDW: 17.1 % — AB (ref 11.0–14.6)
RETIC CT ABS: 68.52 10*3/uL (ref 34.80–93.90)
Retic %: 1.48 % (ref 0.80–1.80)
WBC: 4.4 10*3/uL (ref 4.0–10.3)
lymph#: 0.7 10*3/uL — ABNORMAL LOW (ref 0.9–3.3)

## 2017-03-18 LAB — MORPHOLOGY: PLT EST: ADEQUATE

## 2017-03-18 LAB — COMPREHENSIVE METABOLIC PANEL
ALK PHOS: 51 U/L (ref 40–150)
ALT: 13 U/L (ref 0–55)
AST: 19 U/L (ref 5–34)
Albumin: 3.7 g/dL (ref 3.5–5.0)
Anion Gap: 8 mEq/L (ref 3–11)
BUN: 13.3 mg/dL (ref 7.0–26.0)
CHLORIDE: 109 meq/L (ref 98–109)
CO2: 21 mEq/L — ABNORMAL LOW (ref 22–29)
Calcium: 9.8 mg/dL (ref 8.4–10.4)
Creatinine: 1.2 mg/dL (ref 0.7–1.3)
GLUCOSE: 146 mg/dL — AB (ref 70–140)
POTASSIUM: 4.1 meq/L (ref 3.5–5.1)
SODIUM: 139 meq/L (ref 136–145)
Total Bilirubin: 0.23 mg/dL (ref 0.20–1.20)
Total Protein: 7.5 g/dL (ref 6.4–8.3)

## 2017-03-18 LAB — LACTATE DEHYDROGENASE: LDH: 154 U/L (ref 125–245)

## 2017-03-18 LAB — FERRITIN: FERRITIN: 10 ng/mL — AB (ref 22–316)

## 2017-03-18 NOTE — Telephone Encounter (Signed)
Gave avs and calendar for November  °

## 2017-03-19 LAB — HAPTOGLOBIN: Haptoglobin: 129 mg/dL (ref 34–200)

## 2017-03-22 ENCOUNTER — Encounter: Payer: Self-pay | Admitting: Hematology and Oncology

## 2017-03-22 ENCOUNTER — Ambulatory Visit (HOSPITAL_BASED_OUTPATIENT_CLINIC_OR_DEPARTMENT_OTHER): Payer: Medicare Other | Admitting: Hematology and Oncology

## 2017-03-22 VITALS — BP 142/84 | HR 89 | Temp 98.8°F | Resp 19 | Ht 70.0 in | Wt 165.2 lb

## 2017-03-22 DIAGNOSIS — D500 Iron deficiency anemia secondary to blood loss (chronic): Secondary | ICD-10-CM

## 2017-03-22 DIAGNOSIS — Z940 Kidney transplant status: Secondary | ICD-10-CM

## 2017-03-22 DIAGNOSIS — D509 Iron deficiency anemia, unspecified: Secondary | ICD-10-CM | POA: Diagnosis not present

## 2017-03-22 LAB — METHYLMALONIC ACID, SERUM: Methylmalonic Acid, Serum: 83 nmol/L (ref 0–378)

## 2017-03-22 MED ORDER — FERROUS SULFATE 325 (65 FE) MG PO TBEC: 325.0000 mg | DELAYED_RELEASE_TABLET | Freq: Three times a day (TID) | ORAL | 3 refills | Status: DC

## 2017-03-23 ENCOUNTER — Other Ambulatory Visit (HOSPITAL_BASED_OUTPATIENT_CLINIC_OR_DEPARTMENT_OTHER): Payer: Medicare Other

## 2017-03-23 ENCOUNTER — Other Ambulatory Visit: Payer: Self-pay

## 2017-03-23 DIAGNOSIS — D509 Iron deficiency anemia, unspecified: Secondary | ICD-10-CM

## 2017-03-23 DIAGNOSIS — K648 Other hemorrhoids: Secondary | ICD-10-CM

## 2017-03-23 LAB — FECAL OCCULT BLOOD, GUAIAC: OCCULT BLOOD: NEGATIVE

## 2017-04-05 DIAGNOSIS — Z23 Encounter for immunization: Secondary | ICD-10-CM | POA: Diagnosis not present

## 2017-04-06 ENCOUNTER — Encounter: Payer: Self-pay | Admitting: Hematology and Oncology

## 2017-04-06 ENCOUNTER — Ambulatory Visit (HOSPITAL_BASED_OUTPATIENT_CLINIC_OR_DEPARTMENT_OTHER): Payer: Medicare Other | Admitting: Hematology and Oncology

## 2017-04-06 ENCOUNTER — Telehealth: Payer: Self-pay | Admitting: Hematology and Oncology

## 2017-04-06 VITALS — BP 150/101 | HR 79 | Temp 98.0°F | Resp 18 | Ht 70.0 in | Wt 162.6 lb

## 2017-04-06 DIAGNOSIS — D509 Iron deficiency anemia, unspecified: Secondary | ICD-10-CM | POA: Insufficient documentation

## 2017-04-06 DIAGNOSIS — Z940 Kidney transplant status: Secondary | ICD-10-CM

## 2017-04-06 NOTE — Assessment & Plan Note (Signed)
61 y.o. male with history of renal transplant in 2010 referred to the clinic for anemia evaluation.  Lab work demonstrates normocytic normochromic somewhat hypoproliferative anemia which is very mild in intensity, but with noticeable drop of hemoglobin from baseline of about 13 down to 11.  Lab work obtained most recently in August is consistent with iron depletion with normal levels of B12 and folate.  Patient does have history of lower GI bleeding likely due to hemorrhoids.  No recent checks for blood in the stool.  Differential diagnosis includes hemolytic anemia due to calcineurin inhibitors, bone marrow suppression due to CellCept, decreased red blood cell production due to decreased production of erythropoietin by transplanted kidney, and development of myeloproliferative/lymphoproliferative disorder in the setting of renal transplant and chronic immunosuppression.  Plan: --Labs as outlined below --Return to clinic in 1 week to review the findings

## 2017-04-06 NOTE — Telephone Encounter (Signed)
Scheduled appt per 12/5 los - Gave patient AVS and calender per los. - Patient did not want labs drawn here - wanted labs drawn with [facility] - provider aware.
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
## 2017-04-09 NOTE — Assessment & Plan Note (Signed)
61 y.o. male with history of renal transplant in 2010 referred to the clinic for anemia evaluation.  Lab work demonstrates normocytic normochromic somewhat hypoproliferative anemia which is very mild in intensity, but with noticeable drop of hemoglobin from baseline of about 13 down to 11.  Lab work obtained most recently in August is consistent with iron depletion with normal levels of B12 and folate.  Patient does have history of lower GI bleeding likely due to hemorrhoids.  No recent checks for blood in the stool.  Differential diagnosis includes hemolytic anemia due to calcineurin inhibitors, bone marrow suppression due to CellCept, decreased red blood cell production due to decreased production of erythropoietin by transplanted kidney, and development of myeloproliferative/lymphoproliferative disorder in the setting of renal transplant and chronic immunosuppression.  Lab work obtained at last visit to the clinic is most consistent with iron deficiency.  Patient does have positive fecal occult blood test consistent with anemia of iron deficiency due to chronic blood loss through GI tract.  Plan: --Start ferrous sulfate for oral iron replacement --Return to clinic in 2 weeks to review tolerance

## 2017-04-13 DIAGNOSIS — R7303 Prediabetes: Secondary | ICD-10-CM | POA: Diagnosis not present

## 2017-04-13 DIAGNOSIS — E785 Hyperlipidemia, unspecified: Secondary | ICD-10-CM | POA: Diagnosis not present

## 2017-04-13 DIAGNOSIS — I10 Essential (primary) hypertension: Secondary | ICD-10-CM | POA: Diagnosis not present

## 2017-04-13 DIAGNOSIS — D649 Anemia, unspecified: Secondary | ICD-10-CM | POA: Diagnosis not present

## 2017-04-13 DIAGNOSIS — G4700 Insomnia, unspecified: Secondary | ICD-10-CM | POA: Diagnosis not present

## 2017-04-13 DIAGNOSIS — R002 Palpitations: Secondary | ICD-10-CM | POA: Diagnosis not present

## 2017-04-13 DIAGNOSIS — Z940 Kidney transplant status: Secondary | ICD-10-CM | POA: Diagnosis not present

## 2017-04-15 DIAGNOSIS — I10 Essential (primary) hypertension: Secondary | ICD-10-CM | POA: Diagnosis not present

## 2017-04-15 DIAGNOSIS — I517 Cardiomegaly: Secondary | ICD-10-CM | POA: Diagnosis not present
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
## 2017-05-07 NOTE — Assessment & Plan Note (Signed)
62 y.o. male with history of renal transplant in 2010 referred to the clinic for anemia evaluation.  Lab work demonstrates normocytic normochromic somewhat hypoproliferative anemia which is very mild in intensity, but with noticeable drop of hemoglobin from baseline of about 13 down to 11.  Lab work obtained most recently in August is consistent with iron depletion with normal levels of B12 and folate.  Patient does have history of lower GI bleeding likely due to hemorrhoids.  No recent checks for blood in the stool.  Differential diagnosis includes hemolytic anemia due to calcineurin inhibitors, bone marrow suppression due to CellCept, decreased red blood cell production due to decreased production of erythropoietin by transplanted kidney, and development of myeloproliferative/lymphoproliferative disorder in the setting of renal transplant and chronic immunosuppression.  Lab work obtained at last visit to the clinic is most consistent with iron deficiency.  Patient does have positive fecal occult blood test consistent with anemia of iron deficiency due to chronic blood loss through GI tract.  Plan: --Continue ferrous sulfate for oral iron replacement & increase to BID if tolerated --Return to clinic in 1 month to assess response

## 2017-05-09 DIAGNOSIS — E785 Hyperlipidemia, unspecified: Secondary | ICD-10-CM | POA: Diagnosis not present

## 2017-05-09 DIAGNOSIS — Z940 Kidney transplant status: Secondary | ICD-10-CM | POA: Diagnosis not present

## 2017-05-09 DIAGNOSIS — R002 Palpitations: Secondary | ICD-10-CM | POA: Diagnosis not present

## 2017-05-09 DIAGNOSIS — G4700 Insomnia, unspecified: Secondary | ICD-10-CM | POA: Diagnosis not present

## 2017-05-09 DIAGNOSIS — I10 Essential (primary) hypertension: Secondary | ICD-10-CM | POA: Diagnosis not present

## 2017-05-12 DIAGNOSIS — I129 Hypertensive chronic kidney disease with stage 1 through stage 4 chronic kidney disease, or unspecified chronic kidney disease: Secondary | ICD-10-CM | POA: Diagnosis not present

## 2017-05-12 DIAGNOSIS — Z940 Kidney transplant status: Secondary | ICD-10-CM | POA: Diagnosis not present

## 2017-05-12 DIAGNOSIS — D649 Anemia, unspecified: Secondary | ICD-10-CM | POA: Diagnosis not present

## 2017-05-12 DIAGNOSIS — N319 Neuromuscular dysfunction of bladder, unspecified: Secondary | ICD-10-CM | POA: Diagnosis not present

## 2017-05-12 DIAGNOSIS — R399 Unspecified symptoms and signs involving the genitourinary system: Secondary | ICD-10-CM | POA: Diagnosis not present

## 2017-05-13 ENCOUNTER — Ambulatory Visit: Payer: Medicare Other | Admitting: Hematology and Oncology

## 2017-05-18 DIAGNOSIS — I10 Essential (primary) hypertension: Secondary | ICD-10-CM | POA: Diagnosis not present

## 2017-05-18 DIAGNOSIS — Z720 Tobacco use: Secondary | ICD-10-CM | POA: Diagnosis not present

## 2017-05-18 DIAGNOSIS — I739 Peripheral vascular disease, unspecified: Secondary | ICD-10-CM | POA: Diagnosis not present

## 2017-05-18 DIAGNOSIS — R0789 Other chest pain: Secondary | ICD-10-CM | POA: Diagnosis not present

## 2017-05-20 DIAGNOSIS — R339 Retention of urine, unspecified: Secondary | ICD-10-CM | POA: Diagnosis not present

## 2017-05-24 DIAGNOSIS — J069 Acute upper respiratory infection, unspecified: Secondary | ICD-10-CM | POA: Diagnosis not present

## 2017-05-24 DIAGNOSIS — R7303 Prediabetes: Secondary | ICD-10-CM | POA: Diagnosis not present

## 2017-05-24 DIAGNOSIS — R05 Cough: Secondary | ICD-10-CM | POA: Diagnosis not present

## 2017-05-27 DIAGNOSIS — I739 Peripheral vascular disease, unspecified: Secondary | ICD-10-CM | POA: Diagnosis not present

## 2017-05-27 DIAGNOSIS — R0789 Other chest pain: Secondary | ICD-10-CM | POA: Diagnosis not present

## 2017-05-27 DIAGNOSIS — I10 Essential (primary) hypertension: Secondary | ICD-10-CM | POA: Diagnosis not present

## 2017-05-27 DIAGNOSIS — Z720 Tobacco use: Secondary | ICD-10-CM | POA: Diagnosis not present

## 2017-05-31 ENCOUNTER — Telehealth: Payer: Self-pay | Admitting: Hematology and Oncology

## 2017-05-31 NOTE — Telephone Encounter (Signed)
Patient stopped by to reschedule 1/11 f/u. Patient given new appointment for 2/8.

## 2017-06-06 DIAGNOSIS — N390 Urinary tract infection, site not specified: Secondary | ICD-10-CM | POA: Diagnosis not present

## 2017-06-06 DIAGNOSIS — E7800 Pure hypercholesterolemia, unspecified: Secondary | ICD-10-CM | POA: Diagnosis not present

## 2017-06-06 DIAGNOSIS — Z940 Kidney transplant status: Secondary | ICD-10-CM | POA: Diagnosis not present

## 2017-06-07 DIAGNOSIS — R0989 Other specified symptoms and signs involving the circulatory and respiratory systems: Secondary | ICD-10-CM | POA: Diagnosis not present

## 2017-06-10 ENCOUNTER — Inpatient Hospital Stay: Payer: Medicare Other | Attending: Hematology and Oncology | Admitting: Hematology and Oncology

## 2017-06-10 ENCOUNTER — Telehealth: Payer: Self-pay | Admitting: Hematology and Oncology

## 2017-06-10 ENCOUNTER — Other Ambulatory Visit: Payer: Self-pay

## 2017-06-10 ENCOUNTER — Ambulatory Visit: Payer: Medicare Other

## 2017-06-10 VITALS — BP 129/85 | HR 80 | Temp 98.6°F | Resp 18 | Ht 70.0 in | Wt 164.4 lb

## 2017-06-10 DIAGNOSIS — Z940 Kidney transplant status: Secondary | ICD-10-CM

## 2017-06-10 DIAGNOSIS — I120 Hypertensive chronic kidney disease with stage 5 chronic kidney disease or end stage renal disease: Secondary | ICD-10-CM | POA: Diagnosis not present

## 2017-06-10 DIAGNOSIS — N186 End stage renal disease: Secondary | ICD-10-CM | POA: Diagnosis not present

## 2017-06-10 DIAGNOSIS — Z992 Dependence on renal dialysis: Secondary | ICD-10-CM | POA: Diagnosis not present

## 2017-06-10 DIAGNOSIS — D509 Iron deficiency anemia, unspecified: Secondary | ICD-10-CM

## 2017-06-10 MED ORDER — FERROUS SULFATE 325 (65 FE) MG PO TBEC: 325.0000 mg | DELAYED_RELEASE_TABLET | Freq: Two times a day (BID) | ORAL | 3 refills | Status: DC

## 2017-06-10 NOTE — Telephone Encounter (Signed)
Patient scheduled per 2/8 LOS AVS/Schedule printed

## 2017-06-14 DIAGNOSIS — I10 Essential (primary) hypertension: Secondary | ICD-10-CM | POA: Diagnosis not present

## 2017-06-14 DIAGNOSIS — Z720 Tobacco use: Secondary | ICD-10-CM | POA: Diagnosis not present

## 2017-06-14 DIAGNOSIS — R0789 Other chest pain: Secondary | ICD-10-CM | POA: Diagnosis not present

## 2017-06-14 DIAGNOSIS — I739 Peripheral vascular disease, unspecified: Secondary | ICD-10-CM | POA: Diagnosis not present

## 2017-07-03 ENCOUNTER — Encounter: Payer: Self-pay | Admitting: Hematology and Oncology
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
## 2017-07-03 NOTE — Assessment & Plan Note (Signed)
62 y.o. male with history of renal transplant in 2010 referred to the clinic for anemia evaluation.  Lab work demonstrates normocytic normochromic somewhat hypoproliferative anemia which is very mild in intensity, but with noticeable drop of hemoglobin from baseline of about 13 down to 11.  Lab work obtained most recently in August is consistent with iron depletion with normal levels of B12 and folate.  Patient does have history of lower GI bleeding likely due to hemorrhoids.  No recent checks for blood in the stool.  Differential diagnosis includes hemolytic anemia due to calcineurin inhibitors, bone marrow suppression due to CellCept, decreased red blood cell production due to decreased production of erythropoietin by transplanted kidney, and development of myeloproliferative/lymphoproliferative disorder in the setting of renal transplant and chronic immunosuppression.  Lab work obtained at last visit to the clinic is most consistent with iron deficiency.  Patient does have positive fecal occult blood test consistent with anemia of iron deficiency due to chronic blood loss through GI tract.  Patient reports compliance with oral iron supplementation, but he did not go to the schedule labs prior to the clinic visit.  Thus, at this time I do not know what his hematological status is or where he has iron load is at this time.  Plan: - Labs today after completion of the clinic visit. -Continue ferrous sulfate 325 mg twice a day -Return to clinic in 1 month with labs 2-3 days prior.

## 2017-07-06 ENCOUNTER — Inpatient Hospital Stay: Payer: Medicare Other | Attending: Hematology and Oncology

## 2017-07-06 DIAGNOSIS — Z992 Dependence on renal dialysis: Secondary | ICD-10-CM | POA: Diagnosis not present

## 2017-07-06 DIAGNOSIS — D509 Iron deficiency anemia, unspecified: Secondary | ICD-10-CM | POA: Diagnosis not present

## 2017-07-06 DIAGNOSIS — I120 Hypertensive chronic kidney disease with stage 5 chronic kidney disease or end stage renal disease: Secondary | ICD-10-CM | POA: Diagnosis not present

## 2017-07-06 DIAGNOSIS — N186 End stage renal disease: Secondary | ICD-10-CM | POA: Insufficient documentation

## 2017-07-06 LAB — CMP (CANCER CENTER ONLY)
ALBUMIN: 3.7 g/dL (ref 3.5–5.0)
ALK PHOS: 51 U/L (ref 40–150)
ALT: 17 U/L (ref 0–55)
ANION GAP: 8 (ref 3–11)
AST: 22 U/L (ref 5–34)
BUN: 12 mg/dL (ref 7–26)
CALCIUM: 9.9 mg/dL (ref 8.4–10.4)
CHLORIDE: 108 mmol/L (ref 98–109)
CO2: 21 mmol/L — AB (ref 22–29)
CREATININE: 1.22 mg/dL (ref 0.70–1.30)
GFR, Estimated: >60 mL/min (ref >60)
Glucose, Bld: 118 mg/dL (ref 70–140)
Potassium: 4 mmol/L (ref 3.5–5.1)
SODIUM: 137 mmol/L (ref 136–145)
Total Bilirubin: 0.4 mg/dL (ref 0.2–1.2)
Total Protein: 7.3 g/dL (ref 6.4–8.3)

## 2017-07-06 LAB — CBC WITH DIFFERENTIAL (CANCER CENTER ONLY)
BASOS PCT: 1 %
Basophils Absolute: 0 10*3/uL (ref 0.0–0.1)
EOS ABS: 0.1 10*3/uL (ref 0.0–0.5)
EOS PCT: 2 %
HCT: 45.9 % (ref 38.4–49.9)
HEMOGLOBIN: 15 g/dL (ref 13.0–17.1)
Lymphocytes Relative: 26 %
Lymphs Abs: 1.1 10*3/uL (ref 0.9–3.3)
MCH: 27.9 pg (ref 27.2–33.4)
MCHC: 32.7 g/dL (ref 32.0–36.0)
MCV: 85.3 fL (ref 79.3–98.0)
Monocytes Absolute: 0.2 10*3/uL (ref 0.1–0.9)
Monocytes Relative: 6 %
NEUTROS PCT: 65 %
Neutro Abs: 2.8 10*3/uL (ref 1.5–6.5)
PLATELETS: 149 10*3/uL (ref 140–400)
RBC: 5.38 MIL/uL (ref 4.20–5.82)
RDW: 16.5 % — ABNORMAL HIGH (ref 11.0–14.6)
WBC: 4.3 10*3/uL (ref 4.0–10.3)

## 2017-07-06 LAB — IRON AND TIBC
Iron: 84 ug/dL (ref 42–163)
SATURATION RATIOS: 30 % — AB (ref 42–163)
TIBC: 276 ug/dL (ref 202–409)
UIBC: 192 ug/dL

## 2017-07-06 LAB — FERRITIN: Ferritin: 32 ng/mL (ref 22–316)

## 2017-07-08 ENCOUNTER — Inpatient Hospital Stay (HOSPITAL_BASED_OUTPATIENT_CLINIC_OR_DEPARTMENT_OTHER): Payer: Medicare Other | Admitting: Hematology and Oncology

## 2017-07-08 ENCOUNTER — Telehealth: Payer: Self-pay | Admitting: Hematology and Oncology

## 2017-07-08 ENCOUNTER — Encounter: Payer: Self-pay | Admitting: Hematology and Oncology

## 2017-07-08 VITALS — BP 117/74 | HR 108 | Temp 98.1°F | Resp 18 | Ht 70.0 in | Wt 166.8 lb

## 2017-07-08 DIAGNOSIS — D509 Iron deficiency anemia, unspecified: Secondary | ICD-10-CM

## 2017-07-08 DIAGNOSIS — I120 Hypertensive chronic kidney disease with stage 5 chronic kidney disease or end stage renal disease: Secondary | ICD-10-CM

## 2017-07-08 DIAGNOSIS — Z992 Dependence on renal dialysis: Secondary | ICD-10-CM

## 2017-07-08 DIAGNOSIS — N186 End stage renal disease: Secondary | ICD-10-CM

## 2017-07-08 DIAGNOSIS — D500 Iron deficiency anemia secondary to blood loss (chronic): Secondary | ICD-10-CM

## 2017-07-08 DIAGNOSIS — Z940 Kidney transplant status: Secondary | ICD-10-CM

## 2017-07-08 NOTE — Telephone Encounter (Signed)
Gave patient avs and calendar with appts per 3/7 los.  °

## 2017-07-18 DIAGNOSIS — E785 Hyperlipidemia, unspecified: Secondary | ICD-10-CM | POA: Diagnosis not present

## 2017-07-18 DIAGNOSIS — I10 Essential (primary) hypertension: Secondary | ICD-10-CM | POA: Diagnosis not present

## 2017-07-18 DIAGNOSIS — Z0000 Encounter for general adult medical examination without abnormal findings: Secondary | ICD-10-CM | POA: Diagnosis not present

## 2017-07-18 DIAGNOSIS — R7303 Prediabetes: Secondary | ICD-10-CM | POA: Diagnosis not present

## 2017-07-18 DIAGNOSIS — G4700 Insomnia, unspecified: Secondary | ICD-10-CM | POA: Diagnosis not present

## 2017-07-18 DIAGNOSIS — Z0110 Encounter for examination of ears and hearing without abnormal findings: Secondary | ICD-10-CM | POA: Diagnosis not present

## 2017-07-18 DIAGNOSIS — Z940 Kidney transplant status: Secondary | ICD-10-CM | POA: Diagnosis not present

## 2017-07-18 DIAGNOSIS — Z125 Encounter for screening for malignant neoplasm of prostate: Secondary | ICD-10-CM | POA: Diagnosis not present

## 2017-07-18 NOTE — Assessment & Plan Note (Signed)
62 y.o. male with history of renal transplant in 2010 followed in our clinic for iron deficiency anemia in the setting of renal transplant, likely due to chronic gastrointestinal blood loss without identified source of far.  Patient is currently on oral iron supplementation.  Patient reports compliance with oral iron supplementation.  Labs obtained prior to the visit demonstrates significant improvement in iron stores as well as hemoglobin level which is now up to 15 times per deciliter from the previous values around 11.  Plan: -Continue ferrous sulfate 325 mg twice a day -Return to clinic in 4 months with labs 2-3 days prior.
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
## 2017-08-03 ENCOUNTER — Other Ambulatory Visit: Payer: Self-pay

## 2017-08-03 DIAGNOSIS — I70211 Atherosclerosis of native arteries of extremities with intermittent claudication, right leg: Secondary | ICD-10-CM

## 2017-08-03 DIAGNOSIS — M79609 Pain in unspecified limb: Secondary | ICD-10-CM

## 2017-08-03 DIAGNOSIS — I70213 Atherosclerosis of native arteries of extremities with intermittent claudication, bilateral legs: Secondary | ICD-10-CM

## 2017-08-03 DIAGNOSIS — I739 Peripheral vascular disease, unspecified: Secondary | ICD-10-CM

## 2017-08-18 DIAGNOSIS — R399 Unspecified symptoms and signs involving the genitourinary system: Secondary | ICD-10-CM | POA: Diagnosis not present

## 2017-08-22 DIAGNOSIS — R339 Retention of urine, unspecified: Secondary | ICD-10-CM | POA: Diagnosis not present

## 2017-08-24 ENCOUNTER — Encounter: Payer: Self-pay | Admitting: Vascular Surgery

## 2017-08-24 ENCOUNTER — Encounter (HOSPITAL_COMMUNITY): Payer: Medicare Other

## 2017-08-24 ENCOUNTER — Ambulatory Visit (HOSPITAL_COMMUNITY)
Admission: RE | Admit: 2017-08-24 | Discharge: 2017-08-24 | Disposition: A | Discharge status: Home / Self Care | Payer: Medicare Other | Source: Ambulatory Visit | Attending: Vascular Surgery | Admitting: Vascular Surgery

## 2017-08-24 ENCOUNTER — Ambulatory Visit (INDEPENDENT_AMBULATORY_CARE_PROVIDER_SITE_OTHER)
Admission: RE | Admit: 2017-08-24 | Discharge: 2017-08-24 | Disposition: A | Discharge status: Home / Self Care | Payer: Medicare Other | Source: Ambulatory Visit | Attending: Vascular Surgery | Admitting: Vascular Surgery

## 2017-08-24 ENCOUNTER — Ambulatory Visit (INDEPENDENT_AMBULATORY_CARE_PROVIDER_SITE_OTHER): Payer: Medicare Other | Admitting: Vascular Surgery

## 2017-08-24 ENCOUNTER — Other Ambulatory Visit: Payer: Self-pay

## 2017-08-24 ENCOUNTER — Ambulatory Visit: Payer: Medicare Other | Admitting: Vascular Surgery

## 2017-08-24 VITALS — BP 101/63 | HR 57 | Temp 97.1°F | Resp 16 | Ht 70.0 in | Wt 166.0 lb

## 2017-08-24 DIAGNOSIS — Z940 Kidney transplant status: Secondary | ICD-10-CM | POA: Diagnosis not present

## 2017-08-24 DIAGNOSIS — I70211 Atherosclerosis of native arteries of extremities with intermittent claudication, right leg: Secondary | ICD-10-CM

## 2017-08-24 DIAGNOSIS — E785 Hyperlipidemia, unspecified: Secondary | ICD-10-CM | POA: Insufficient documentation

## 2017-08-24 DIAGNOSIS — I70213 Atherosclerosis of native arteries of extremities with intermittent claudication, bilateral legs: Secondary | ICD-10-CM

## 2017-08-24 DIAGNOSIS — I10 Essential (primary) hypertension: Secondary | ICD-10-CM | POA: Diagnosis not present

## 2017-08-24 DIAGNOSIS — M79609 Pain in unspecified limb: Secondary | ICD-10-CM

## 2017-08-24 DIAGNOSIS — Z87891 Personal history of nicotine dependence: Secondary | ICD-10-CM | POA: Diagnosis not present

## 2017-08-24 DIAGNOSIS — I739 Peripheral vascular disease, unspecified: Secondary | ICD-10-CM

## 2017-08-24 DIAGNOSIS — Z95828 Presence of other vascular implants and grafts: Secondary | ICD-10-CM | POA: Insufficient documentation

## 2017-08-24 NOTE — Progress Notes (Signed)
Established Previous Bypass   History of Present Illness   Scott Chavez is a 62 y.o. (April 26, 1956) male who presents with chief complaint: increased activity.    Prior procedures include: 1. L iliofem EA w/ BPA, L CFA to BK pop BPG with NR ips GSV (04/24/13) 2. PTA+S R CIA and L CIA (10/25/13) 3. OA+PTA R fempop R F-P (12/13/13) 4. R iliofem EA w/ BPA, extended R profundaplasty, R CFA to BK pop BPG w/ NR ips GSV (04/17/15) 5. Excision of thrombose L RC AVF (09/24/15) 6. Redo R fem exposure, R iliofem EA w/ BPA (10/25/16)  The patient's symptoms have not progressed.  The patient's symptoms are: mild anesthesia in legs. The patient's treatment regimen currently included: maximal medical management and increased exercise.  The patient's PMH, PSH, SH, and FamHx were reviewed on 08/24/17 are unchanged from 02/25/17.  Current Outpatient Medications  Medication Sig Dispense Refill  . aspirin 325 MG tablet Take 325 mg by mouth 2 (two) times daily.     . Cholecalciferol (VITAMIN D3) 5000 units CAPS Take 5,000 Units by mouth 2 (two) times daily.    . ciprofloxacin (CIPRO) 500 MG tablet Take 500 mg by mouth every 12 (twelve) hours.  0  . citalopram (CELEXA) 10 MG tablet Take 10-20 mg by mouth daily. Takes 20mg  po every morning and 10mg  po daily at [facility]    . citalopram (CELEXA) 40 MG tablet Take 40 mg by mouth daily.  2  . Cyanocobalamin (VITAMIN B-12 PO) Take 1 tablet by mouth daily.    . ferrous sulfate 325 (65 FE) MG EC tablet Take 1 tablet (325 mg total) by mouth 2 (two) times daily. 90 tablet 3  . lidocaine (LIDODERM) 5 % Place 1 patch onto the skin daily. Remove & Discard patch within 12 hours or as directed by MD 30 patch 0  . lisdexamfetamine (VYVANSE) 20 MG capsule Take 20 mg by mouth 2 (two) times daily. Takes 20mg  po every morning and 20mg  po daily at [facility].    . magnesium oxide (MAG-OX) 400 MG tablet Take 400 mg by mouth 2 (two) times daily.     . Multiple Vitamin (MULTI-VITAMIN  PO) Take 1 tablet by mouth at bedtime.     . mycophenolate (CELLCEPT) 250 MG capsule Take 250-500 mg by mouth See admin instructions. 500 mg in the morning and 250 mg in the evening    . NIFEdipine (PROCARDIA-XL/ADALAT-CC/NIFEDICAL-XL) 30 MG 24 hr tablet Take 30 mg by mouth daily.     Marland Kitchen omeprazole (PRILOSEC) 20 MG capsule Take 20 mg by mouth daily. Reported on 06/06/2015    . pregabalin (LYRICA) 50 MG capsule Take 1 capsule (50 mg total) by mouth 3 (three) times daily. 30 capsule 5  . Pyridoxine HCl (VITAMIN B-6 PO) Take 1 tablet by mouth daily.    . rosuvastatin (CRESTOR) 5 MG tablet Take 5 mg by mouth at bedtime.     . sodium chloride (OCEAN) 0.65 % SOLN nasal spray Place 1 spray into both nostrils as needed (swelling). 30 mL 0  . tacrolimus (PROGRAF) 1 MG capsule Take 3 mg by mouth 2 (two) times daily.     . tadalafil (CIALIS) 20 MG tablet Take 20 mg by mouth daily as needed for erectile dysfunction.    . tamsulosin (FLOMAX) 0.4 MG CAPS capsule Take 0.4 mg by mouth daily.    Marland Kitchen zolpidem (AMBIEN) 10 MG tablet Take 10 mg by mouth at bedtime as needed for  sleep.     No current facility-administered medications for this visit.     On ROS today: no rest pain, kidney transplant still functioning   Physical Examination   Vitals:   08/24/17 1004  BP: 101/63  Pulse: (!) 57  Resp: 16  Temp: (!) 97.1 F (36.2 C)  TempSrc: Oral  SpO2: 99%  Weight: 166 lb (75.3 kg)  Height: 5\' 10"  (1.778 m)   Body mass index is 23.82 kg/m.  General Alert, O x 3, WD, NAD  Pulmonary Sym exp, good B air movt, CTA B  Cardiac RRR, Nl S1, S2, no Murmurs, No rubs, No S3,S4  Vascular Vessel Right Left  Radial Palpable Palpable  Brachial Palpable Palpable  Carotid Palpable, No Bruit Palpable, No Bruit  Aorta Not palpable N/A  Femoral Palpable Palpable  Popliteal Not palpable Not palpable  PT Palpable Palpable  DP Palpable Palpable    Gastro- intestinal soft, non-distended, non-tender to palpation, No  guarding or rebound, no HSM, no masses, no CVAT B, No palpable prominent aortic pulse,    Musculo- skeletal M/S 5/5 throughout  , Extremities without ischemic changes  , No edema present, No visible varicosities , No Lipodermatosclerosis present  Neurologic Pain and light touch intact in extremities , Motor exam as listed above    Non-Invasive Vascular Imaging ABI (08/24/2017)  R:   ABI: 0.97 (1.02),   PT: tri  DP: mono  TBI:  0.60  L:   ABI: 1.02 (0.91),   PT: mono  DP: mono  TBI: 0.48  Aortoiliac Duplex (08/24/2017)  Ao: 84-96 c/s  R iliac: 174-265 c/s, stent PSV 250 c/s (Vr <3)  L iliac: 81-124 c/s, stent PSV 122 c/s (Vr <1)  Bypass Duplex (08/24/2017)  R:  Inflow: 69 c/s  BPG: 50-116 c/s  Outflow: 231 c/s (stenosis noted, Vr ~2)  L:  Inflow: 58 c/s  BPG: 37-91 c/s  Outflow: 106 c/s   Medical Decision Making   Scott Chavez is a 62 y.o. male who presents with: s/p B CIA PTA+S, multiple endovascular procedures in both legs leading B CFA to BK BPG w/ NR ips GSV, redo exposure, R iliofemoral EA w/ BPA .   Prior distal EIA stenosis on L side does not appear to be significant on today's studies  Based on the patient's vascular studies and examination, I have offered the patient: BLE ABI, B bypass duplex, and aortoiliac duplex in 6 months.  I discussed in depth with the patient the nature of atherosclerosis, and emphasized the importance of maximal medical management including strict control of blood pressure, blood glucose, and lipid levels, obtaining regular exercise, and cessation of smoking.    The patient is aware that without maximal medical management the underlying atherosclerotic disease process will progress, limiting the benefit of any interventions.  I discussed in depth with the patient the need for long term surveillance to improve the primary assisted patency of his bypass.  The patient agrees to cooperate with such.  The patient is  currently on a statin: Crestor.   The patient is currently on an anti-platelet: ASA.  Thank you for allowing [place] to participate in this patient's care.   [name], MD, FACS [facility] Office: [phone] Pager: [phone]

## 2017-08-26 ENCOUNTER — Encounter (HOSPITAL_COMMUNITY): Payer: Medicare Other

## 2017-08-26 ENCOUNTER — Ambulatory Visit: Payer: Medicare Other | Admitting: Vascular Surgery

## 2017-09-07 DIAGNOSIS — Z940 Kidney transplant status: Secondary | ICD-10-CM | POA: Diagnosis not present

## 2017-09-07 DIAGNOSIS — N390 Urinary tract infection, site not specified: Secondary | ICD-10-CM | POA: Diagnosis not present

## 2017-09-07 DIAGNOSIS — E7800 Pure hypercholesterolemia, unspecified: Secondary | ICD-10-CM | POA: Diagnosis not present

## 2017-09-10 ENCOUNTER — Emergency Department (HOSPITAL_COMMUNITY)
Admission: EM | Admit: 2017-09-10 | Discharge: 2017-09-10 | Disposition: A | Discharge status: Home / Self Care | Payer: Medicare Other | Attending: Emergency Medicine | Admitting: Emergency Medicine

## 2017-09-10 ENCOUNTER — Emergency Department (HOSPITAL_COMMUNITY): Payer: Medicare Other

## 2017-09-10 ENCOUNTER — Encounter (HOSPITAL_COMMUNITY): Payer: Self-pay | Admitting: Emergency Medicine

## 2017-09-10 ENCOUNTER — Ambulatory Visit (INDEPENDENT_AMBULATORY_CARE_PROVIDER_SITE_OTHER): Payer: Medicare Other

## 2017-09-10 ENCOUNTER — Encounter (HOSPITAL_COMMUNITY): Payer: Self-pay | Admitting: *Deleted

## 2017-09-10 ENCOUNTER — Ambulatory Visit (HOSPITAL_COMMUNITY)
Admission: EM | Admit: 2017-09-10 | Discharge: 2017-09-10 | Disposition: A | Discharge status: Home / Self Care | Payer: Medicare Other | Source: Home / Self Care

## 2017-09-10 ENCOUNTER — Other Ambulatory Visit: Payer: Self-pay

## 2017-09-10 DIAGNOSIS — I120 Hypertensive chronic kidney disease with stage 5 chronic kidney disease or end stage renal disease: Secondary | ICD-10-CM | POA: Diagnosis not present

## 2017-09-10 DIAGNOSIS — Z992 Dependence on renal dialysis: Secondary | ICD-10-CM | POA: Diagnosis not present

## 2017-09-10 DIAGNOSIS — R05 Cough: Secondary | ICD-10-CM | POA: Insufficient documentation

## 2017-09-10 DIAGNOSIS — R0781 Pleurodynia: Secondary | ICD-10-CM | POA: Diagnosis not present

## 2017-09-10 DIAGNOSIS — R0789 Other chest pain: Secondary | ICD-10-CM

## 2017-09-10 DIAGNOSIS — Z79899 Other long term (current) drug therapy: Secondary | ICD-10-CM | POA: Diagnosis not present

## 2017-09-10 DIAGNOSIS — Z87891 Personal history of nicotine dependence: Secondary | ICD-10-CM | POA: Insufficient documentation

## 2017-09-10 DIAGNOSIS — Z7982 Long term (current) use of aspirin: Secondary | ICD-10-CM | POA: Diagnosis not present

## 2017-09-10 DIAGNOSIS — I712 Thoracic aortic aneurysm, without rupture, unspecified: Secondary | ICD-10-CM

## 2017-09-10 DIAGNOSIS — R079 Chest pain, unspecified: Secondary | ICD-10-CM | POA: Diagnosis not present

## 2017-09-10 DIAGNOSIS — N186 End stage renal disease: Secondary | ICD-10-CM | POA: Diagnosis not present

## 2017-09-10 DIAGNOSIS — R059 Cough, unspecified: Secondary | ICD-10-CM

## 2017-09-10 DIAGNOSIS — R071 Chest pain on breathing: Secondary | ICD-10-CM | POA: Diagnosis not present

## 2017-09-10 LAB — CBC
HCT: 44.6 % (ref 39.0–52.0)
HEMOGLOBIN: 14.7 g/dL (ref 13.0–17.0)
MCH: 29.4 pg (ref 26.0–34.0)
MCHC: 33 g/dL (ref 30.0–36.0)
MCV: 89.2 fL (ref 78.0–100.0)
PLATELETS: 130 10*3/uL — AB (ref 150–400)
RBC: 5 MIL/uL (ref 4.22–5.81)
RDW: 14.8 % (ref 11.5–15.5)
WBC: 6.1 10*3/uL (ref 4.0–10.5)

## 2017-09-10 LAB — BASIC METABOLIC PANEL
ANION GAP: 10 (ref 5–15)
BUN: 9 mg/dL (ref 6–20)
CALCIUM: 9.8 mg/dL (ref 8.9–10.3)
CO2: 22 mmol/L (ref 22–32)
CREATININE: 1.03 mg/dL (ref 0.61–1.24)
Chloride: 105 mmol/L (ref 101–111)
Glucose, Bld: 107 mg/dL — ABNORMAL HIGH (ref 65–99)
Potassium: 4.6 mmol/L (ref 3.5–5.1)
SODIUM: 137 mmol/L (ref 135–145)

## 2017-09-10 LAB — I-STAT TROPONIN, ED
TROPONIN I, POC: 0.02 ng/mL (ref 0.00–0.08)
Troponin i, poc: 0.01 ng/mL (ref 0.00–0.08)

## 2017-09-10 MED ORDER — HYDROCODONE-ACETAMINOPHEN 5-325 MG PO TABS: 1.0000 | ORAL_TABLET | Freq: Four times a day (QID) | ORAL | 0 refills | Status: DC | PRN

## 2017-09-10 MED ORDER — MORPHINE SULFATE (PF) 4 MG/ML IV SOLN
4.0000 mg | Freq: Once | INTRAVENOUS | Status: AC
  Administered 2017-09-10: 4 mg via INTRAVENOUS
  Filled 2017-09-10: qty 1

## 2017-09-10 MED ORDER — NAPROXEN 500 MG PO TABS: 500.0000 mg | ORAL_TABLET | Freq: Two times a day (BID) | ORAL | 0 refills | Status: DC

## 2017-09-10 MED ORDER — IOPAMIDOL (ISOVUE-370) INJECTION 76%
100.0000 mL | Freq: Once | INTRAVENOUS | Status: AC | PRN
  Administered 2017-09-10: 100 mL via INTRAVENOUS

## 2017-09-10 NOTE — ED Notes (Signed)
Patient transported to CT 

## 2017-09-10 NOTE — ED Notes (Signed)
Pt found sleeping in chairs with headphones on.

## 2017-09-10 NOTE — ED Provider Notes (Signed)
[place]   [id number] 09/10/17 Arrival Time: 1201   SUBJECTIVE:  Scott Chavez is a 62 y.o. male who presents to the urgent care with complaint of severe right sided chest pain which worsens with deep breath.  He states that he developed a cough on Thursday night, 2 days ago, and that beginning last night at about midnight he developed a sharp stabbing chest pain that kept him up much of the night.  He does not have any tenderness about his chest and he's had no trauma to the chest.  Patient's had bilateral peripheral artery bypasses in his legs. He's had a kidney transplant.  Patient had similar kind of pain when he had the flu last winter.   Patient denies any leg pain.  Past Medical History:  Diagnosis Date  . ADHD   . Anemia    pt. not sure, but reports he is taking Fe for one month now.  . Aorto-iliac disease ([place])   . Bladder dysfunction   . ESRD (end stage renal disease) [facility])    [facility] , followed by Dr. Moshe Cipro   . GERD (gastroesophageal reflux disease)   . Hemodialysis patient [facility])    "on dialysis 1998-2010" 04/25/2013)  . Hepatitis C    has been treated with Harvoni  . History of renal failure   . Hypercholesteremia   . Hypertension   . Internal hemorrhoids    Colonoscopy 2010 and anoscopy 2015  . Peripheral vascular disease ([place])   . Pneumonia   . Self-catheterizes urinary bladder    pt. choice, but he reports that he desires to do this to avoid retention    Family History  Problem Relation Age of Onset  . Hypertension Father   . Other Father        amputation  . Hypertension Mother    Social History   Socioeconomic History  . Marital status: Divorced    Spouse name: Not on file  . Number of children: 3  . Years of education: Not on file  . Highest education level: Not on file  Occupational History  . Occupation: Ship broker  Social Needs  . Financial resource strain: Not on file  . Food  insecurity:    Worry: Not on file    Inability: Not on file  . Transportation needs:    Medical: Not on file    Non-medical: Not on file  Tobacco Use  . Smoking status: Former Smoker    Packs/day: 0.50    Years: 34.00    Pack years: 17.00    Types: E-cigarettes    Last attempt to quit: 02/17/2003    Years since quitting: 14.5  . Smokeless tobacco: Former Systems developer  . Tobacco comment: vapor cigrarette  Substance and Sexual Activity  . Alcohol use: No    Alcohol/week: 0.0 oz  . Drug use: No    Types: Cocaine, Marijuana, Heroin    Comment: 04/25/2013 "abused in the past ; stopped  11/13/2002  . Sexual activity: Not on file  Lifestyle  . Physical activity:    Days per week: Not on file    Minutes per session: Not on file  . Stress: Not on file  Relationships  . Social connections:    Talks on phone: Not on file    Gets together: Not on file    Attends religious service: Not on file    Active member of club or organization: Not on file    Attends meetings  of clubs or organizations: Not on file    Relationship status: Not on file  . Intimate partner violence:    Fear of current or ex partner: Not on file    Emotionally abused: Not on file    Physically abused: Not on file    Forced sexual activity: Not on file  Other Topics Concern  . Not on file  Social History Narrative   Patient reports he is divorced. 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   On disability otherwise, he is a vapor smoker, 3 caffeinated beverages daily   Current Meds  Medication Sig  . citalopram (CELEXA) 40 MG tablet Take 40 mg by mouth daily.  . Cyanocobalamin (VITAMIN B-12 PO) Take 1 tablet by mouth daily.  Marland Kitchen lisdexamfetamine (VYVANSE) 20 MG capsule Take 20 mg by mouth 2 (two) times daily. Takes 20mg  po every morning and 20mg  po daily at [facility]  . mycophenolate (CELLCEPT) 250 MG capsule Take 250-500 mg by mouth See admin instructions. 500 mg in the morning and 250 mg in the evening  .  NIFEdipine (PROCARDIA-XL/ADALAT-CC/NIFEDICAL-XL) 30 MG 24 hr tablet Take 30 mg by mouth daily.   Marland Kitchen omeprazole (PRILOSEC) 20 MG capsule Take 20 mg by mouth daily. Reported on 06/06/2015  . pregabalin (LYRICA) 50 MG capsule Take 1 capsule (50 mg total) by mouth 3 (three) times daily.  . Pyridoxine HCl (VITAMIN B-6 PO) Take 1 tablet by mouth daily.  . tacrolimus (PROGRAF) 1 MG capsule Take 3 mg by mouth 2 (two) times daily.   . tadalafil (CIALIS) 20 MG tablet Take 20 mg by mouth daily as needed for erectile dysfunction.  . tamsulosin (FLOMAX) 0.4 MG CAPS capsule Take 0.4 mg by mouth daily.  Marland Kitchen zolpidem (AMBIEN) 10 MG tablet Take 10 mg by mouth at bedtime as needed for sleep.  . [DISCONTINUED] ciprofloxacin (CIPRO) 500 MG tablet Take 500 mg by mouth every 12 (twelve) hours.   Allergies  Allergen Reactions  . Tape Rash and Other (See Comments)    Silk tape      ROS: As per HPI, remainder of ROS negative.   OBJECTIVE:   Vitals:   09/10/17 1207  BP: 124/70  Pulse: 66  Resp: (!) 22  Temp: 99 F (37.2 C)  TempSrc: Oral  SpO2: 95%     General appearance: alert; obvious great distress with any kind of deep breath Eyes: PERRL; EOMI; conjunctiva normal HENT: normocephalic; atraumatic;  oral mucosa normal Neck: supple Lungs: Basilar rales on inspiration with auscultation bilaterally Heart: regular rate and rhythm Abdomen: soft, non-tender; bowel sounds normal; no masses or organomegaly; no guarding or rebound tenderness Back: no CVA tenderness Extremities: no cyanosis or edema; symmetrical with no gross deformities; nontender legs. Skin: warm and dry Neurologic: normal gait; grossly normal Psychological: alert and cooperative; worried mood   EKG:  Left axis deviation.   Labs:  Results for orders placed or performed in visit on 07/06/17  CBC with Differential (Cancer Center Only)  Result Value Ref Range   WBC Count 4.3 4.0 - 10.3 K/uL   RBC 5.38 4.20 - 5.82 MIL/uL    Hemoglobin 15.0 13.0 - 17.1 g/dL   HCT 45.9 38.4 - 49.9 %   MCV 85.3 79.3 - 98.0 fL   MCH 27.9 27.2 - 33.4 pg   MCHC 32.7 32.0 - 36.0 g/dL   RDW 16.5 (H) 11.0 - 14.6 %   Platelet Count 149 140 - 400  K/uL   Neutrophils Relative % 65 %   Neutro Abs 2.8 1.5 - 6.5 K/uL   Lymphocytes Relative 26 %   Lymphs Abs 1.1 0.9 - 3.3 K/uL   Monocytes Relative 6 %   Monocytes Absolute 0.2 0.1 - 0.9 K/uL   Eosinophils Relative 2 %   Eosinophils Absolute 0.1 0.0 - 0.5 K/uL   Basophils Relative 1 %   Basophils Absolute 0.0 0.0 - 0.1 K/uL  CMP (Cancer Center only)  Result Value Ref Range   Sodium 137 136 - 145 mmol/L   Potassium 4.0 3.5 - 5.1 mmol/L   Chloride 108 98 - 109 mmol/L   CO2 21 (L) 22 - 29 mmol/L   Glucose, Bld 118 70 - 140 mg/dL   BUN 12 7 - 26 mg/dL   Creatinine 1.22 0.70 - 1.30 mg/dL   Calcium 9.9 8.4 - 10.4 mg/dL   Total Protein 7.3 6.4 - 8.3 g/dL   Albumin 3.7 3.5 - 5.0 g/dL   AST 22 5 - 34 U/L   ALT 17 0 - 55 U/L   Alkaline Phosphatase 51 40 - 150 U/L   Total Bilirubin 0.4 0.2 - 1.2 mg/dL   GFR, Est Non Af Am >60 >60 mL/min   GFR, Est AFR Am >60 >60 mL/min   Anion gap 8 3 - 11  Iron and TIBC  Result Value Ref Range   Iron 84 42 - 163 ug/dL   TIBC 276 202 - 409 ug/dL   Saturation Ratios 30 (L) 42 - 163 %   UIBC 192 ug/dL  Ferritin  Result Value Ref Range   Ferritin 32 22 - 316 ng/mL    Labs Reviewed - No data to display  Dg Chest 2 View  Result Date: 09/10/2017 CLINICAL DATA:  Onset of chest pain last night at midnight, pain extending in the RIGHT upper quadrant, history of bronchitis, started vaping, hypertension, renal transplant in 2010, former smoker EXAM: CHEST - 2 VIEW COMPARISON:  10/02/2014 FINDINGS: Enlargement of cardiac silhouette. Atherosclerotic calcification aorta. Mediastinal contours and pulmonary vascularity normal. Peribronchial thickening with bibasilar atelectasis new since prior exam. Mild LEFT apex scarring. Resolution of LEFT upper lobe infiltrate  seen on previous exam. No infiltrate, pleural effusion or pneumothorax. Osseous structures unremarkable. IMPRESSION: Mild enlargement of cardiac silhouette. Bronchitic changes with bibasilar atelectasis. Electronically Signed   By: Lavonia Dana M.D.   On: 09/10/2017 12:54       ASSESSMENT & PLAN:  1. Atypical chest pain   2. Cough   Because you are on immunosuppressants, and because the chest pain is so sharp and incapacitating, you will need to have further evaluation in the emergency room.  Your EKG does not show any acute heart attack and your chest x-ray shows that you do not have an acute pneumonia.  There are other causes of this kind of chest pain and cough such as a clot to the lungs which can be life-threatening and need to be excluded before treating you for a bronchitis or other lung infection.   No orders of the defined types were placed in this encounter.   Reviewed expectations re: course of current medical issues. Questions answered. Outlined signs and symptoms indicating need for more acute intervention. Patient verbalized understanding. After Visit Summary given.    Procedures:      Robyn Haber, MD 09/10/17 1318

## 2017-09-10 NOTE — ED Provider Notes (Signed)
[facility] EMERGENCY DEPARTMENT Provider Note   CSN: [id number] Arrival date & time: 09/10/17  1336     History   Chief Complaint Chief Complaint  Patient presents with  . Chest Pain    HPI Scott Chavez is a 62 y.o. male.  HPI Patient presents to the emergency room for evaluation of severe chest pain.  Patient states he developed a cough on Thursday.  Last night he started having a sharp stabbing pain in the center of his chest.  Made it very hard for him to sleep.  Patient states the pain increases when he takes a deep breath.  He denies any swelling in his legs.  No fevers.  No history of heart disease.  No known history of lung disease.  Patient does have several medical problems including history of end-stage renal disease, hepatitis, and aortoiliac disease. Past Medical History:  Diagnosis Date  . ADHD   . Anemia    pt. not sure, but reports he is taking Fe for one month now.  . Aorto-iliac disease ([place])   . Bladder dysfunction   . ESRD (end stage renal disease) [facility])    trans[facility]- [facility] , followed by Dr. Moshe Cipro   . GERD (gastroesophageal reflux disease)   . Hemodialysis patient [facility])    "on dialysis 1998-2010" 04/25/2013)  . Hepatitis C    has been treated with Harvoni  . History of renal failure   . Hypercholesteremia   . Hypertension   . Internal hemorrhoids    Colonoscopy 2010 and anoscopy 2015  . Peripheral vascular disease ([place])   . Pneumonia   . Self-catheterizes urinary bladder    pt. choice, but he reports that he desires to do this to avoid retention     Patient Active Problem List   Diagnosis Date Noted  . Iron deficiency anemia 04/06/2017  . Peripheral arterial disease ([place]) 10/25/2016  . Chronic neck pain 03/15/2016  . Chronic low back pain without sciatica 03/15/2016  . Nonallopathic lesion of lumbosacral region 03/15/2016  . Nonallopathic lesion of sacral region 03/15/2016  . Nonallopathic lesion  of thoracic region 03/15/2016  . Liver fibrosis 12/18/2015  . Arteriovenous fistula thrombosis ([place]) 10/16/2015  . Chronic hepatitis C without hepatic coma ([place]) 09/03/2015  . Atherosclerosis of native arteries of extremities with intermittent claudication, right leg ([place]) 04/17/2015  . Pain in joint, lower leg 09/13/2014  . Hemorrhoids, internal, with bleeding 01/29/2014  . Pain in limb 08/17/2013  . Peripheral vascular disease, unspecified ([place]) 06/08/2013  . Redness of skin-Calf to foot LEFT 05/25/2013  . Tenderness in limb-Calf to foot LEFT 05/25/2013  . Swelling of limb-Calf to foot LEFT 05/25/2013  . Postoperative pain 05/02/2013  . PAD (peripheral artery disease) ([place]) 04/24/2013  . Preop cardiovascular exam 03/27/2013  . Essential hypertension 03/27/2013  . Hyperlipidemia 03/27/2013  . Atherosclerosis of native arteries of extremity with intermittent claudication ([place]) 02/16/2013  . History of renal transplant 02/16/2013    Past Surgical History:  Procedure Laterality Date  . ABDOMINAL AORTAGRAM N/A 03/09/2013   Procedure: ABDOMINAL Maxcine Ham;  Surgeon: Elam Dutch, MD;  Location: [facility] CATH LAB;  Service: Cardiovascular;  Laterality: N/A;  . AV FISTULA PLACEMENT Left 1998   "removed 2011" (04/25/2013)  . AV FISTULA REPAIR Left    removal with partial vein removed  . COLONOSCOPY    . ENDARTERECTOMY FEMORAL Right 04/17/2015   Procedure: ENDARTERECTOMY RIGHT ILIOFEMORAL WITH PROFUNDOPLASTY;  Surgeon: Conrad Wauna, MD;  Location: [facility] OR;  Service: Vascular;  Laterality: Right;  . ENDARTERECTOMY FEMORAL Right 10/25/2016   Procedure: RIGHT ILIOFEMORAL ENDARTERECTOMY WITH BOVINE PERICARDIAL PATCH ANGIOPLASTY;  Surgeon: Conrad [place], MD;  Location: [place];  Service: Vascular;  Laterality: Right;  . FEMORAL-POPLITEAL BYPASS GRAFT Left 04/24/2013   Procedure: BYPASS GRAFT COMMON FEMORALARTERY-BELOW KNEE POPLITEAL ARTERY WITH GREATER SAPHENOUS VEIN;  Surgeon: Conrad Pennsboro, MD;   Location: [place];  Service: Vascular;  Laterality: Left;  . FEMORAL-POPLITEAL BYPASS GRAFT Right 04/17/2015   Procedure: BYPASS GRAFT RIGHT FEMORALTO BELOW KNEE POPLITEAL ARTERY USING RIGHT NON REVERSED GREATER SAPPHENOUS VEIN;  Surgeon: Conrad [place], MD;  Location: [place];  Service: Vascular;  Laterality: Right;  . INTRAOPERATIVE ARTERIOGRAM Left 04/24/2013   Procedure: INTRA OPERATIVE ARTERIOGRAM;  Surgeon: Conrad Shorter, MD;  Location: [place];  Service: Vascular;  Laterality: Left;  . KIDNEY TRANSPLANT    . LOWER EXTREMITY ANGIOGRAM Bilateral 03/09/2013   Procedure: LOWER EXTREMITY ANGIOGRAM;  Surgeon: Elam Dutch, MD;  Location: [facility] CATH LAB;  Service: Cardiovascular;  Laterality: Bilateral;  . LOWER EXTREMITY ANGIOGRAM Right 10/25/2013   Procedure: LOWER EXTREMITY ANGIOGRAM;  Surgeon: Conrad Conway, MD;  Location: [facility] CATH LAB;  Service: Cardiovascular;  Laterality: Right;  . LOWER EXTREMITY ANGIOGRAM Right 12/13/2013   Procedure: LOWER EXTREMITY ANGIOGRAM;  Surgeon: Conrad [place], MD;  Location: [facility] CATH LAB;  Service: Cardiovascular;  Laterality: Right;  . NEPHRECTOMY RECIPIENT  2010  . NEPHRECTOMY TRANSPLANTED ORGAN    . PATCH ANGIOPLASTY Right 04/17/2015   Procedure: Rueben Bash PATCH ANGIOPLASTY, RIGHT ILEOFEMORAL;  Surgeon: Conrad La [place]ange, MD;  Location: [place];  Service: Vascular;  Laterality: Right;  . PERIPHERAL VASCULAR CATHETERIZATION N/A 04/10/2015   Procedure: Abdominal Aortogram;  Surgeon: Conrad [place], MD;  Location: [place] CV LAB;  Service: Cardiovascular;  Laterality: N/A;  . PERIPHERAL VASCULAR CATHETERIZATION N/A 05/10/2016   Procedure: Abdominal Aortogram w/Lower Extremity;  Surgeon: Conrad Vaughn, MD;  Location: [place] CV LAB;  Service: Cardiovascular;  Laterality: N/A;  . s/p cadaveric renal transplant  December 2010   [place] Left 09/24/2015   Procedure: EXCISION OF THROMBOSED RADIOCEPHALIC ARTERIOVENOUS FISTULA;  Surgeon: Conrad [place],  MD;  Location: [place];  Service: Vascular;  Laterality: Left;  Marland Kitchen VEIN HARVEST Right 04/17/2015   Procedure: RIGHT GREATER SAPPHENOUS VEIN HARVEST ;  Surgeon: Conrad [place], MD;  Location: [place];  Service: Vascular;  Laterality: Right;        Home Medications    Prior to Admission medications   Medication Sig Start Date End Date Taking? Authorizing Provider  aspirin EC 81 MG tablet Take 81-162 mg by mouth daily.   Yes [provider]  Cholecalciferol (VITAMIN D3) 5000 units CAPS Take 5,000 Units by mouth 2 (two) times daily.   Yes [provider]  citalopram (CELEXA) 40 MG tablet Take 20 mg by mouth 2 (two) times daily.  02/18/17  Yes [provider]  Cyanocobalamin (VITAMIN B-12 PO) Take 1 tablet by mouth daily.   Yes [provider]  diphenhydramine-acetaminophen (TYLENOL PM) 25-500 MG TABS tablet Take 1 tablet by mouth at bedtime as needed (for pain or sleep).    Yes [provider]  ferrous sulfate 325 (65 FE) MG EC tablet Take 1 tablet (325 mg total) by mouth 2 (two) times daily. 06/10/17  Yes Ardath Sax, MD  ketotifen (ZADITOR) 0.025 % ophthalmic solution Place 1 drop into  both eyes 2 (two) times daily as needed (for seasonal allergies).   Yes [provider]  lisdexamfetamine (VYVANSE) 20 MG capsule Take 20 mg by mouth 2 (two) times daily.    Yes [provider]  metoprolol succinate (TOPROL-XL) 25 MG 24 hr tablet Take 25 mg by mouth daily. 08/16/17  Yes [provider]  mycophenolate (CELLCEPT) 250 MG capsule Take 250-500 mg by mouth See admin instructions. Take 500 mg by mouth in the morning and 250 mg in the evening   Yes [provider]  NIFEdipine (PROCARDIA-XL/ADALAT-CC/NIFEDICAL-XL) 30 MG 24 hr tablet Take 30 mg by mouth daily.    Yes [provider]  omeprazole (PRILOSEC) 20 MG capsule Take 20 mg by mouth daily. Reported on 06/06/2015   Yes [provider]  pregabalin (LYRICA) 50  MG capsule Take 1 capsule (50 mg total) by mouth 3 (three) times daily. 01/08/16  Yes Conrad [place], MD  Pyridoxine HCl (VITAMIN B-6 PO) Take 1 tablet by mouth 3 (three) times a week.    Yes [provider]  rosuvastatin (CRESTOR) 5 MG tablet Take 5 mg by mouth at bedtime.    Yes [provider]  tacrolimus (PROGRAF) 1 MG capsule Take 3 mg by mouth 2 (two) times daily.    Yes [provider]  tadalafil (CIALIS) 20 MG tablet Take 20 mg by mouth daily as needed for erectile dysfunction.   Yes [provider]  tamsulosin (FLOMAX) 0.4 MG CAPS capsule Take 0.4 mg by mouth daily.   Yes [provider]  zolpidem (AMBIEN) 10 MG tablet Take 10 mg by mouth at bedtime.    Yes [provider]  HYDROcodone-acetaminophen (NORCO/VICODIN) 5-325 MG tablet Take 1 tablet by mouth every 6 (six) hours as needed. 09/10/17   Dorie Rank, MD  lidocaine (LIDODERM) 5 % Place 1 patch onto the skin daily. Remove & Discard patch within 12 hours or as directed by MD Patient not taking: Reported on 09/10/2017 11/19/16   Conrad Crum, MD  naproxen (NAPROSYN) 500 MG tablet Take 1 tablet (500 mg total) by mouth 2 (two) times daily with a meal. As needed for pain 09/10/17   Dorie Rank, MD  sodium chloride (OCEAN) 0.65 % SOLN nasal spray Place 1 spray into both nostrils as needed (swelling). Patient not taking: Reported on 09/10/2017 10/02/16   Ashley Murrain, NP    Family History Family History  Problem Relation Age of Onset  . Hypertension Father   . Other Father        amputation  . Hypertension Mother     Social History Social History   Tobacco Use  . Smoking status: Former Smoker    Packs/day: 0.50    Years: 34.00    Pack years: 17.00    Types: E-cigarettes    Last attempt to quit: 02/17/2003    Years since quitting: 14.5  . Smokeless tobacco: Former Systems developer  . Tobacco comment: vapor cigrarette  Substance Use Topics  . Alcohol use: No    Alcohol/week: 0.0 oz  . Drug  use: No    Types: Cocaine, Marijuana, Heroin    Comment: 04/25/2013 "abused in the past ; stopped  11/13/2002     Allergies   Tape   Review of Systems Review of Systems  All other systems reviewed and are negative.    Physical Exam Updated Vital Signs BP (!) 142/89   Pulse 66   Temp 98.6 F (37 C) (Oral)  Resp (!) 23   Ht 1.778 m (5\' 10" )   Wt 71.7 kg (158 lb)   SpO2 90%   BMI 22.67 kg/m   Physical Exam  Constitutional: He appears well-developed and well-nourished. He appears distressed.  Patient is crying in pain  HENT:  Head: Normocephalic and atraumatic.  Right Ear: External ear normal.  Left Ear: External ear normal.  Eyes: Conjunctivae are normal. Right eye exhibits no discharge. Left eye exhibits no discharge. No scleral icterus.  Neck: Neck supple. No tracheal deviation present.  Cardiovascular: Normal rate, regular rhythm and intact distal pulses.  Pulmonary/Chest: Effort normal and breath sounds normal. No stridor. No respiratory distress. He has no wheezes. He has no rales. He exhibits tenderness. He exhibits no crepitus, no edema and no swelling.  Abdominal: Soft. Bowel sounds are normal. He exhibits no distension. There is no tenderness. There is no rebound and no guarding.  Musculoskeletal: He exhibits no edema or tenderness.  Neurological: He is alert. He has normal strength. No sensory deficit. Cranial nerve deficit: no gross deficits. He exhibits normal muscle tone. He displays no seizure activity. Coordination normal.  Skin: Skin is warm and dry. No rash noted.  Psychiatric: He has a normal mood and affect.  Nursing note and vitals reviewed.    ED Treatments / Results  Labs (all labs ordered are listed, but only abnormal results are displayed) Labs Reviewed  BASIC METABOLIC PANEL - Abnormal; Notable for the following components:      Result Value   Glucose, Bld 107 (*)    All other components within normal limits  CBC - Abnormal; Notable for  the following components:   Platelets 130 (*)    All other components within normal limits  I-STAT TROPONIN, ED  I-STAT TROPONIN, ED    EKG None  Radiology Dg Chest 2 View  Result Date: 09/10/2017 CLINICAL DATA:  Productive cough beginning 2 days ago. EXAM: CHEST - 2 VIEW COMPARISON:  09/10/2017; 10/02/2014; 04/19/2013 FINDINGS: Grossly unchanged enlarged cardiac silhouette and mediastinal contours with atherosclerotic plaque within thoracic aorta. Pulmonary vascular is indistinct with cephalization of flow. Worsening bibasilar heterogeneous/consolidative opacities. No pleural effusion or pneumothorax. No acute osseus abnormalities. IMPRESSION: Suspected mild pulmonary edema with associated bibasilar opacities, atelectasis versus infiltrate. Electronically Signed   By: Sandi Mariscal M.D.   On: 09/10/2017 14:08   Dg Chest 2 View  Result Date: 09/10/2017 CLINICAL DATA:  Onset of chest pain last night at midnight, pain extending in the RIGHT upper quadrant, history of bronchitis, started vaping, hypertension, renal transplant in 2010, former smoker EXAM: CHEST - 2 VIEW COMPARISON:  10/02/2014 FINDINGS: Enlargement of cardiac silhouette. Atherosclerotic calcification aorta. Mediastinal contours and pulmonary vascularity normal. Peribronchial thickening with bibasilar atelectasis new since prior exam. Mild LEFT apex scarring. Resolution of LEFT upper lobe infiltrate seen on previous exam. No infiltrate, pleural effusion or pneumothorax. Osseous structures unremarkable. IMPRESSION: Mild enlargement of cardiac silhouette. Bronchitic changes with bibasilar atelectasis. Electronically Signed   By: Lavonia Dana M.D.   On: 09/10/2017 12:54   Ct Angio Chest Pe W And/or Wo Contrast  Result Date: 09/10/2017 CLINICAL DATA:  RIGHT-sided chest pain. History of stenosis of leg vessels. Patient takes daily aspirin. EXAM: CT ANGIOGRAPHY CHEST WITH CONTRAST TECHNIQUE: Multidetector CT imaging of the chest was performed  using the standard protocol during bolus administration of intravenous contrast. Multiplanar CT image reconstructions and MIPs were obtained to evaluate the vascular anatomy. CONTRAST:  66 ml ISOVUE-370 IOPAMIDOL (ISOVUE-370) INJECTION 76% COMPARISON:  Chest x-ray 09/10/2017 FINDINGS: Cardiovascular: There is significant coronary artery calcification. There is thoracic aortic atherosclerosis and ascending aortic aneurysm, measuring 4.1 centimeters. Trace pericardial effusion. The pulmonary arteries are well opacified by contrast bolus in there is no acute pulmonary embolus. Mediastinum/Nodes: The visualized portion of the thyroid gland has a normal appearance. Small hiatal hernia is present. No mediastinal, hilar, or axillary adenopathy. Lungs/Pleura: Significant paraseptal emphysematous changes. No suspicious pulmonary nodules. There is atelectasis at both lung bases, RIGHT greater than LEFT. Upper Abdomen: Gallbladder is present. Kidneys are small with significant parenchymal thinning. There is a simple cyst extending from the UPPER pole of the LEFT kidney which measures 2.3 centimeters. There is significant atherosclerotic calcification of the visualized abdominal aorta. Musculoskeletal: Scoliosis.  No acute osseous abnormality. Review of the MIP images confirms the above findings. IMPRESSION: 1. Technically adequate exam showing no acute pulmonary embolus. 2. Significant coronary artery atherosclerosis. 3. Aortic aneurysm NOS (ICD10-I71.9). Ascending aorta is 4.1 centimeters. Recommend annual imaging followup by CTA or MRA. This recommendation follows 2010 ACCF/AHA/AATS/ACR/ASA/SCA/SCAI/SIR/STS/SVM Guidelines for the Diagnosis and Management of Patients with Thoracic Aortic Disease. Circulation. 2010; 121: O037-C488 4.  Emphysema (ICD10-J43.9). 5. Small kidneys consistent with known chronic renal disease. 6. LEFT renal cysts. 7.  Aortic Atherosclerosis (ICD10-I70.0). Electronically Signed   By: Nolon Nations  M.D.   On: 09/10/2017 20:20    Procedures Procedures (including critical care time)  Medications Ordered in ED Medications  morphine 4 MG/ML injection 4 mg (4 mg Intravenous Given 09/10/17 1835)  iopamidol (ISOVUE-370) 76 % injection 100 mL (100 mLs Intravenous Contrast Given 09/10/17 1928)     Initial Impression / Assessment and Plan / ED Course  I have reviewed the triage vital signs and the nursing notes.  Pertinent labs & imaging results that were available during my care of the patient were reviewed by me and considered in my medical decision making (see chart for details).  Clinical Course as of Sep 10 2205  Sat Sep 10, 2017  2207 Cardiac enzymes and electrolyte panel are normal.  No signs of any significant abnormality on x-rays   [JK]    Clinical Course User Index [JK] Dorie Rank, MD  Patient presented to the emergency room for evaluation of pleuritic chest pain.  Patient has a complex medical history but no known history of pulmonary embolism disease.  Patient CT scan of the chest did not show any evidence of pulmonary embolism.  Patient does have a 4.1 cm aortic aneurysm but this will just require annual imaging.  Patient's cardiac enzymes are normal.  Delta troponin is pending but as long as that is normal I think can be safely discharged.  His symptoms seem to be related to pleurisy.  PA Upstill will check the 2nd troponin  Final Clinical Impressions(s) / ED Diagnoses   Final diagnoses:  Pleuritic chest pain  Thoracic aortic aneurysm without rupture [facility])    ED Discharge Orders        Ordered    HYDROcodone-acetaminophen (NORCO/VICODIN) 5-325 MG tablet  Every 6 hours PRN     09/10/17 2202    naproxen (NAPROSYN) 500 MG tablet  2 times daily with meals     09/10/17 2203       Dorie Rank, MD 09/10/17 2207

## 2017-09-10 NOTE — ED Triage Notes (Signed)
Productive cough started 2 days ago.  Last night started with sharp, constant right-sided chest pain with significant pain when breathing, coughing, moving.  Has been taking 81 mg ASA & Mucinex DM.  Has hx palpitations - f/u with cardiologist at end of month.

## 2017-09-10 NOTE — Discharge Instructions (Signed)
Because you are taking immunosuppressants, and because the chest pain is so sharp and incapacitating, you will need to have further evaluation in the emergency room.  Your EKG does not show any acute heart attack and your chest x-ray shows that you do not have an acute pneumonia.  There are other causes of this kind of chest pain and cough such as a clot to the lungs which can be life-threatening and need to be excluded before treating you for a bronchitis or other lung infection.

## 2017-09-10 NOTE — ED Notes (Signed)
Returned from CT.

## 2017-09-10 NOTE — ED Provider Notes (Signed)
CP - [facility] H/o vascular dz, no h/o CAD, PE Pleuritic CP, intermittent 4 cm thoracic aneurysm - no PE Delta pending  Anticipate d/ch home after delta  Delta trop negative. The patient was re-evaluated and feels stable for discharge home. All questions answered. He can be discharged per plan of previous treatment team.     Charlann Lange, PA-C 09/10/17 2245    Dorie Rank, MD 09/11/17 [phone]

## 2017-09-10 NOTE — ED Notes (Signed)
Called pt. 3 times for vitals, no answer

## 2017-09-10 NOTE — Discharge Instructions (Signed)
Follow-up with your primary care doctor.  Take the medications as needed for pain.  The radiologist recommended a CT scan annually to check on your aneurysm

## 2017-09-10 NOTE — ED Triage Notes (Signed)
Patient complains of CP and SOB for 3 days. States that the CP is worse with movement and cough, directed to ED from [facility], alert and oriented

## 2017-09-10 NOTE — ED Notes (Signed)
Pt asking pharmacy technician for a snack. EDP notified pt scan results are needed before NPO order is lifted.

## 2017-09-12 ENCOUNTER — Other Ambulatory Visit: Payer: Self-pay

## 2017-09-12 DIAGNOSIS — I70213 Atherosclerosis of native arteries of extremities with intermittent claudication, bilateral legs: Secondary | ICD-10-CM

## 2017-09-12 DIAGNOSIS — I739 Peripheral vascular disease, unspecified: Secondary | ICD-10-CM

## 2017-09-12 DIAGNOSIS — I70211 Atherosclerosis of native arteries of extremities with intermittent claudication, right leg: Secondary | ICD-10-CM

## 2017-09-14 DIAGNOSIS — R05 Cough: Secondary | ICD-10-CM | POA: Diagnosis not present

## 2017-09-14 DIAGNOSIS — E785 Hyperlipidemia, unspecified: Secondary | ICD-10-CM | POA: Diagnosis not present

## 2017-09-14 DIAGNOSIS — J069 Acute upper respiratory infection, unspecified: Secondary | ICD-10-CM | POA: Diagnosis not present

## 2017-09-14 DIAGNOSIS — Z940 Kidney transplant status: Secondary | ICD-10-CM | POA: Diagnosis not present

## 2017-09-14 DIAGNOSIS — I10 Essential (primary) hypertension: Secondary | ICD-10-CM | POA: Diagnosis not present

## 2017-09-19 DIAGNOSIS — H2513 Age-related nuclear cataract, bilateral: Secondary | ICD-10-CM | POA: Diagnosis not present

## 2017-09-19 DIAGNOSIS — H524 Presbyopia: Secondary | ICD-10-CM | POA: Diagnosis not present

## 2017-09-19 DIAGNOSIS — H538 Other visual disturbances: Secondary | ICD-10-CM | POA: Diagnosis not present

## 2017-09-22 DIAGNOSIS — E785 Hyperlipidemia, unspecified: Secondary | ICD-10-CM | POA: Diagnosis not present

## 2017-09-22 DIAGNOSIS — I10 Essential (primary) hypertension: Secondary | ICD-10-CM | POA: Diagnosis not present

## 2017-09-22 DIAGNOSIS — N319 Neuromuscular dysfunction of bladder, unspecified: Secondary | ICD-10-CM | POA: Diagnosis not present

## 2017-09-22 DIAGNOSIS — Z4822 Encounter for aftercare following kidney transplant: Secondary | ICD-10-CM | POA: Diagnosis not present

## 2017-09-22 DIAGNOSIS — Z87891 Personal history of nicotine dependence: Secondary | ICD-10-CM | POA: Diagnosis not present

## 2017-09-22 DIAGNOSIS — I739 Peripheral vascular disease, unspecified: Secondary | ICD-10-CM | POA: Diagnosis not present

## 2017-09-22 DIAGNOSIS — Z79899 Other long term (current) drug therapy: Secondary | ICD-10-CM | POA: Diagnosis not present

## 2017-09-22 DIAGNOSIS — Z940 Kidney transplant status: Secondary | ICD-10-CM | POA: Diagnosis not present

## 2017-09-22 DIAGNOSIS — D8989 Other specified disorders involving the immune mechanism, not elsewhere classified: Secondary | ICD-10-CM | POA: Diagnosis not present

## 2017-09-22 DIAGNOSIS — Z792 Long term (current) use of antibiotics: Secondary | ICD-10-CM | POA: Diagnosis not present

## 2017-09-22 DIAGNOSIS — Z7982 Long term (current) use of aspirin: Secondary | ICD-10-CM | POA: Diagnosis not present

## 2017-09-23 DIAGNOSIS — R7303 Prediabetes: Secondary | ICD-10-CM | POA: Diagnosis not present

## 2017-09-23 DIAGNOSIS — Z0000 Encounter for general adult medical examination without abnormal findings: Secondary | ICD-10-CM | POA: Diagnosis not present

## 2017-09-23 DIAGNOSIS — I10 Essential (primary) hypertension: Secondary | ICD-10-CM | POA: Diagnosis not present

## 2017-09-23 DIAGNOSIS — Z940 Kidney transplant status: Secondary | ICD-10-CM | POA: Diagnosis not present

## 2017-10-04 DIAGNOSIS — R399 Unspecified symptoms and signs involving the genitourinary system: Secondary | ICD-10-CM | POA: Diagnosis not present

## 2017-10-14 ENCOUNTER — Telehealth: Payer: Self-pay | Admitting: Nurse Practitioner

## 2017-10-14 NOTE — Telephone Encounter (Signed)
Mailed patient calendar of upcoming July appointment updates.

## 2017-10-20 DIAGNOSIS — R399 Unspecified symptoms and signs involving the genitourinary system: Secondary | ICD-10-CM | POA: Diagnosis not present

## 2017-10-20 DIAGNOSIS — Z940 Kidney transplant status: Secondary | ICD-10-CM | POA: Diagnosis not present

## 2017-10-20 DIAGNOSIS — N319 Neuromuscular dysfunction of bladder, unspecified: Secondary | ICD-10-CM | POA: Diagnosis not present

## 2017-10-20 DIAGNOSIS — D649 Anemia, unspecified: Secondary | ICD-10-CM | POA: Diagnosis not present

## 2017-10-20 DIAGNOSIS — I129 Hypertensive chronic kidney disease with stage 1 through stage 4 chronic kidney disease, or unspecified chronic kidney disease: Secondary | ICD-10-CM | POA: Diagnosis not present

## 2017-10-20 DIAGNOSIS — N390 Urinary tract infection, site not specified: Secondary | ICD-10-CM | POA: Diagnosis not present

## 2017-11-07 ENCOUNTER — Inpatient Hospital Stay: Payer: Medicare Other | Attending: Nurse Practitioner

## 2017-11-07 DIAGNOSIS — N186 End stage renal disease: Secondary | ICD-10-CM | POA: Insufficient documentation

## 2017-11-07 DIAGNOSIS — I120 Hypertensive chronic kidney disease with stage 5 chronic kidney disease or end stage renal disease: Secondary | ICD-10-CM | POA: Insufficient documentation

## 2017-11-07 DIAGNOSIS — K625 Hemorrhage of anus and rectum: Secondary | ICD-10-CM | POA: Insufficient documentation

## 2017-11-07 DIAGNOSIS — D500 Iron deficiency anemia secondary to blood loss (chronic): Secondary | ICD-10-CM | POA: Insufficient documentation

## 2017-11-09 ENCOUNTER — Inpatient Hospital Stay (HOSPITAL_BASED_OUTPATIENT_CLINIC_OR_DEPARTMENT_OTHER): Payer: Medicare Other | Admitting: Nurse Practitioner

## 2017-11-09 ENCOUNTER — Inpatient Hospital Stay: Payer: Medicare Other

## 2017-11-09 ENCOUNTER — Telehealth: Payer: Self-pay | Admitting: Nurse Practitioner

## 2017-11-09 ENCOUNTER — Ambulatory Visit: Payer: Medicare Other | Admitting: Hematology and Oncology

## 2017-11-09 ENCOUNTER — Encounter: Payer: Self-pay | Admitting: Nurse Practitioner

## 2017-11-09 VITALS — BP 135/86 | HR 76 | Temp 99.1°F | Resp 18 | Ht 70.0 in | Wt 156.8 lb

## 2017-11-09 DIAGNOSIS — N186 End stage renal disease: Secondary | ICD-10-CM | POA: Diagnosis not present

## 2017-11-09 DIAGNOSIS — D500 Iron deficiency anemia secondary to blood loss (chronic): Secondary | ICD-10-CM | POA: Diagnosis not present

## 2017-11-09 DIAGNOSIS — I120 Hypertensive chronic kidney disease with stage 5 chronic kidney disease or end stage renal disease: Secondary | ICD-10-CM | POA: Diagnosis not present

## 2017-11-09 DIAGNOSIS — Z940 Kidney transplant status: Secondary | ICD-10-CM

## 2017-11-09 DIAGNOSIS — D508 Other iron deficiency anemias: Secondary | ICD-10-CM

## 2017-11-09 DIAGNOSIS — K625 Hemorrhage of anus and rectum: Secondary | ICD-10-CM | POA: Diagnosis not present

## 2017-11-09 LAB — CBC WITH DIFFERENTIAL (CANCER CENTER ONLY)
BASOS PCT: 1 %
Basophils Absolute: 0 10*3/uL (ref 0.0–0.1)
EOS ABS: 0.1 10*3/uL (ref 0.0–0.5)
Eosinophils Relative: 2 %
HCT: 44.5 % (ref 38.4–49.9)
HEMOGLOBIN: 14.7 g/dL (ref 13.0–17.1)
Lymphocytes Relative: 22 %
Lymphs Abs: 0.9 10*3/uL (ref 0.9–3.3)
MCH: 29.8 pg (ref 27.2–33.4)
MCHC: 33 g/dL (ref 32.0–36.0)
MCV: 90.1 fL (ref 79.3–98.0)
MONOS PCT: 6 %
Monocytes Absolute: 0.3 10*3/uL (ref 0.1–0.9)
NEUTROS PCT: 69 %
Neutro Abs: 2.7 10*3/uL (ref 1.5–6.5)
PLATELETS: 169 10*3/uL (ref 140–400)
RBC: 4.94 MIL/uL (ref 4.20–5.82)
RDW: 14.6 % (ref 11.0–14.6)
WBC: 4 10*3/uL (ref 4.0–10.3)

## 2017-11-09 LAB — IRON AND TIBC
Iron: 75 ug/dL (ref 42–163)
SATURATION RATIOS: 23 % — AB (ref 42–163)
TIBC: 323 ug/dL (ref 202–409)
UIBC: 248 ug/dL

## 2017-11-09 LAB — CMP (CANCER CENTER ONLY)
ALBUMIN: 4.3 g/dL (ref 3.5–5.0)
ALT: 10 U/L (ref 0–44)
ANION GAP: 9 (ref 5–15)
AST: 19 U/L (ref 15–41)
Alkaline Phosphatase: 52 U/L (ref 38–126)
BUN: 10 mg/dL (ref 8–23)
CO2: 26 mmol/L (ref 22–32)
Calcium: 10 mg/dL (ref 8.9–10.3)
Chloride: 104 mmol/L (ref 98–111)
Creatinine: 1.24 mg/dL (ref 0.61–1.24)
GFR, Est AFR Am: >60 mL/min (ref >60)
GFR, Estimated: >60 mL/min (ref >60)
GLUCOSE: 111 mg/dL — AB (ref 70–99)
POTASSIUM: 4.4 mmol/L (ref 3.5–5.1)
Sodium: 139 mmol/L (ref 135–145)
TOTAL PROTEIN: 7.8 g/dL (ref 6.5–8.1)
Total Bilirubin: 0.6 mg/dL (ref 0.3–1.2)

## 2017-11-09 LAB — FERRITIN: Ferritin: 47 ng/mL (ref 24–336)

## 2017-11-09 NOTE — Progress Notes (Addendum)
Scott Chavez  [phone] [phone]  Clinic Follow up Note   Patient Care Team: Benito Mccreedy, MD as PCP - General (Internal Medicine) 11/09/2017   DIAGNOSIS: Iron deficiency anemia   HISTORY OF PRESENTING ILLNESS: Scott Chavez was found to have abnormal CBC in 2016. He was initially seen by Dr. Lebron Conners in 03/22/2017 for mild normocytic, normochromic, hypoproliferative anemia Hgb 11.2.  Ferritin and iron studies indicated iron depletion, serum iron 20 and ferritin of 10.  MMA, haptoglobin, LDH, and FOBT were normal.  He was prescribed oral iron therapy 1 tablet twice daily which he continued for a few months.  Iron studies and CBC on 07/2017 were improved, his anemia resolved. Denies IV infusion or RBC transfusion.  No history of pica.  Colonoscopy in 2010 showed internal hemorrhoids.  Most recent colonoscopy on 10/22/2016 per [facility] shows chronic inactive gastritis with reactive epithelial changes, 2 polyps in the transverse colon that were removed, and moderate-sized nonbleeding internal hemorrhoids. Repeat colonoscopy due in 3-5 years. He does report bright red blood per rectum occasionally with BMs straining.  This occurs approximately 2-3 times per month with small amount of blood.  He has remote history of renal transplant in 02/19/2009 secondary to hypertension requiring dialysis for 13 years, on CellCept and tacrolimus.  History of GERD, HTN, HL, angina, PVD status post multiple vascular procedures.  He has 2 living children, one deceased, he is single and lives alone.  Independent of all ADLs.  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   INTERVAL HISTORY Today he reports he is doing well, appetite and energy level are normal.  He has 10 pounds intentional weight loss.  Has occasional diarrhea and blood in stool with straining. Has known internal hemorrhoids. Occasional leg pain from vascular surgeries is at baseline.  Has  intermittent pleuritic chest pain, none recently.  He was last seen in the ER on 09/19/2017, work up was unremarkable and he was discharged home.  REVIEW OF SYSTEMS:   Constitutional: Denies fevers, chills or abnormal weight loss Eyes: Denies blurriness of vision Ears, nose, mouth, throat, and face: Denies mucositis or sore throat Respiratory: Denies cough, dyspnea or wheezes (+) periodic pleuritis chest pain  Cardiovascular: Denies palpitation, chest discomfort or lower extremity swelling Gastrointestinal:  Denies nausea, vomiting, constipation, heartburn or change in bowel habits (+) periodic diarrhea (+) intermittent blood in stool with straining, 2-3 x per month   Skin: Denies abnormal skin rashes Lymphatics: Denies new lymphadenopathy or easy bruising Neurological:Denies numbness, tingling or new weaknesses Behavioral/Psych: Mood is stable, no new changes  All other systems were reviewed with the patient and are negative.  MEDICAL HISTORY:  Past Medical History:  Diagnosis Date  . ADHD   . Anemia    pt. not sure, but reports he is taking Fe for one month now.  . Aorto-iliac disease ([place])   . Bladder dysfunction   . ESRD (end stage renal disease) [facility])    transplant- [facility] , followed by Dr. Moshe Cipro   . GERD (gastroesophageal reflux disease)   . Hemodialysis patient [facility])    "on dialysis 1998-2010" 04/25/2013)  . Hepatitis C    has been treated with Harvoni  . History of renal failure   . Hypercholesteremia   . Hypertension   . Internal hemorrhoids    Colonoscopy 2010 and anoscopy 2015  . Peripheral vascular disease ([place])   . Pneumonia   . Self-catheterizes urinary bladder  pt. choice, but he reports that he desires to do this to avoid retention     SURGICAL HISTORY: Past Surgical History:  Procedure Laterality Date  . ABDOMINAL AORTAGRAM N/A 03/09/2013   Procedure: ABDOMINAL Maxcine Ham;  Surgeon: Elam Dutch, MD;  Location: [facility] CATH LAB;   Service: Cardiovascular;  Laterality: N/A;  . AV FISTULA PLACEMENT Left 1998   "removed 2011" (04/25/2013)  . AV FISTULA REPAIR Left    removal with partial vein removed  . COLONOSCOPY    . ENDARTERECTOMY FEMORAL Right 04/17/2015   Procedure: ENDARTERECTOMY RIGHT ILIOFEMORAL WITH PROFUNDOPLASTY;  Surgeon: Conrad Dennehotso, MD;  Location: [place];  Service: Vascular;  Laterality: Right;  . ENDARTERECTOMY FEMORAL Right 10/25/2016   Procedure: RIGHT ILIOFEMORAL ENDARTERECTOMY WITH BOVINE PERICARDIAL PATCH ANGIOPLASTY;  Surgeon: Conrad Mahaska, MD;  Location: [place];  Service: Vascular;  Laterality: Right;  . FEMORAL-POPLITEAL BYPASS GRAFT Left 04/24/2013   Procedure: BYPASS GRAFT COMMON FEMORALARTERY-BELOW KNEE POPLITEAL ARTERY WITH GREATER SAPHENOUS VEIN;  Surgeon: Conrad [place] Bib[place], MD;  Location: [place];  Service: Vascular;  Laterality: Left;  . FEMORAL-POPLITEAL BYPASS GRAFT Right 04/17/2015   Procedure: BYPASS GRAFT RIGHT FEMORALTO BELOW KNEE POPLITEAL ARTERY USING RIGHT NON REVERSED GREATER SAPPHENOUS VEIN;  Surgeon: Conrad Greeley Center, MD;  Location: [place];  Service: Vascular;  Laterality: Right;  . INTRAOPERATIVE ARTERIOGRAM Left 04/24/2013   Procedure: INTRA OPERATIVE ARTERIOGRAM;  Surgeon: Conrad Parksdale, MD;  Location: [place];  Service: Vascular;  Laterality: Left;  . KIDNEY TRANSPLANT    . LOWER EXTREMITY ANGIOGRAM Bilateral 03/09/2013   Procedure: LOWER EXTREMITY ANGIOGRAM;  Surgeon: Elam Dutch, MD;  Location: [facility] CATH LAB;  Service: Cardiovascular;  Laterality: Bilateral;  . LOWER EXTREMITY ANGIOGRAM Right 10/25/2013   Procedure: LOWER EXTREMITY ANGIOGRAM;  Surgeon: Conrad Wexford, MD;  Location: [facility] CATH LAB;  Service: Cardiovascular;  Laterality: Right;  . LOWER EXTREMITY ANGIOGRAM Right 12/13/2013   Procedure: LOWER EXTREMITY ANGIOGRAM;  Surgeon: Conrad Bogalusa, MD;  Location: [facility] CATH LAB;  Service: Cardiovascular;  Laterality: Right;  . NEPHRECTOMY RECIPIENT  2010  . NEPHRECTOMY TRANSPLANTED ORGAN    .  PATCH ANGIOPLASTY Right 04/17/2015   Procedure: Rueben Bash PATCH ANGIOPLASTY, RIGHT ILEOFEMORAL;  Surgeon: Conrad Meiners Oaks, MD;  Location: [place];  Service: Vascular;  Laterality: Right;  . PERIPHERAL VASCULAR CATHETERIZATION N/A 04/10/2015   Procedure: Abdominal Aortogram;  Surgeon: Conrad Chapin, MD;  Location: [place] CV LAB;  Service: Cardiovascular;  Laterality: N/A;  . PERIPHERAL VASCULAR CATHETERIZATION N/A 05/10/2016   Procedure: Abdominal Aortogram w/Lower Extremity;  Surgeon: Conrad Awendaw, MD;  Location: [place] CV LAB;  Service: Cardiovascular;  Laterality: N/A;  . s/p cadaveric renal transplant  December 2010   [place] Left 09/24/2015   Procedure: EXCISION OF THROMBOSED RADIOCEPHALIC ARTERIOVENOUS FISTULA;  Surgeon: Conrad Woonsocket, MD;  Location: [place];  Service: Vascular;  Laterality: Left;  Marland Kitchen VEIN HARVEST Right 04/17/2015   Procedure: RIGHT GREATER SAPPHENOUS VEIN HARVEST ;  Surgeon: Conrad , MD;  Location: [place];  Service: Vascular;  Laterality: Right;    I have reviewed the social history and family history with the patient and they are unchanged from previous note.  ALLERGIES:  is allergic to tape.  MEDICATIONS:  Current Outpatient Medications  Medication Sig Dispense Refill  . aspirin EC 81 MG tablet Take 81-162 mg by mouth daily.    . Cholecalciferol (VITAMIN D3) 5000 units CAPS Take 5,000 Units  by mouth 2 (two) times daily.    . citalopram (CELEXA) 40 MG tablet Take 20 mg by mouth 2 (two) times daily.   2  . Cyanocobalamin (VITAMIN B-12 PO) Take 1 tablet by mouth daily.    . diphenhydramine-acetaminophen (TYLENOL PM) 25-500 MG TABS tablet Take 1 tablet by mouth at bedtime as needed (for pain or sleep).     Marland Kitchen ketotifen (ZADITOR) 0.025 % ophthalmic solution Place 1 drop into both eyes 2 (two) times daily as needed (for seasonal allergies).    Marland Kitchen lidocaine (LIDODERM) 5 % Place 1 patch onto the skin daily. Remove & Discard patch within 12  hours or as directed by MD 30 patch 0  . lisdexamfetamine (VYVANSE) 20 MG capsule Take 20 mg by mouth 2 (two) times daily.     . metoprolol succinate (TOPROL-XL) 25 MG 24 hr tablet Take 25 mg by mouth daily.  2  . mycophenolate (CELLCEPT) 250 MG capsule Take 250-500 mg by mouth See admin instructions. Take 500 mg by mouth in the morning and 250 mg in the evening    . NIFEdipine (PROCARDIA-XL/ADALAT-CC/NIFEDICAL-XL) 30 MG 24 hr tablet Take 30 mg by mouth daily.     Marland Kitchen omeprazole (PRILOSEC) 20 MG capsule Take 20 mg by mouth daily. Reported on 06/06/2015    . pregabalin (LYRICA) 50 MG capsule Take 1 capsule (50 mg total) by mouth 3 (three) times daily. 30 capsule 5  . Pyridoxine HCl (VITAMIN B-6 PO) Take 1 tablet by mouth 3 (three) times a week.     . rosuvastatin (CRESTOR) 5 MG tablet Take 5 mg by mouth at bedtime.     . sodium chloride (OCEAN) 0.65 % SOLN nasal spray Place 1 spray into both nostrils as needed (swelling). 30 mL 0  . tacrolimus (PROGRAF) 1 MG capsule Take 3 mg by mouth 2 (two) times daily.     . tadalafil (CIALIS) 20 MG tablet Take 20 mg by mouth daily as needed for erectile dysfunction.    . tamsulosin (FLOMAX) 0.4 MG CAPS capsule Take 0.4 mg by mouth daily.    Marland Kitchen zolpidem (AMBIEN) 10 MG tablet Take 10 mg by mouth at bedtime.     . ferrous sulfate 325 (65 FE) MG EC tablet Take 1 tablet (325 mg total) by mouth 2 (two) times daily. 90 tablet 3  . HYDROcodone-acetaminophen (NORCO/VICODIN) 5-325 MG tablet Take 1 tablet by mouth every 6 (six) hours as needed. (Patient not taking: Reported on 11/09/2017) 12 tablet 0  . naproxen (NAPROSYN) 500 MG tablet Take 1 tablet (500 mg total) by mouth 2 (two) times daily with a meal. As needed for pain 20 tablet 0   No current facility-administered medications for this visit.     PHYSICAL EXAMINATION: ECOG PERFORMANCE STATUS: 1 - Symptomatic but completely ambulatory  Vitals:   11/09/17 1143  BP: 135/86  Pulse: 76  Resp: 18  Temp: 99.1 F (37.3  C)  SpO2: 97%   Filed Weights   11/09/17 1143  Weight: 156 lb 12.8 oz (71.1 kg)    GENERAL:alert, no distress and comfortable SKIN: no rashes or significant lesions EYES: normal, Conjunctiva are pink and non-injected, sclera clear OROPHARYNX:no thrush or ulcers LYMPH:  no palpable cervical or supraclavicular lymphadenopathy LUNGS: clear to auscultation with normal breathing effort HEART: regular rate & rhythm and no murmurs and no lower extremity edema ABDOMEN:abdomen soft, non-tender and normal bowel sounds Musculoskeletal:no cyanosis of digits and no clubbing  NEURO: alert & oriented x 3  with fluent speech, no focal motor/sensory deficits  LABORATORY DATA:  I have reviewed the data as listed CBC Latest Ref Rng & Units 11/09/2017 09/10/2017 07/06/2017  WBC 4.0 - 10.3 K/uL 4.0 6.1 4.3  Hemoglobin 13.0 - 17.1 g/dL 14.7 14.7 15.0  Hematocrit 38.4 - 49.9 % 44.5 44.6 45.9  Platelets 140 - 400 K/uL 169 130(L) 149     CMP Latest Ref Rng & Units 11/09/2017 09/10/2017 07/06/2017  Glucose 70 - 99 mg/dL 111(H) 107(H) 118  BUN 8 - 23 mg/dL 10 9 12   Creatinine 0.61 - 1.24 mg/dL 1.24 1.03 1.22  Sodium 135 - 145 mmol/L 139 137 137  Potassium 3.5 - 5.1 mmol/L 4.4 4.6 4.0  Chloride 98 - 111 mmol/L 104 105 108  CO2 22 - 32 mmol/L 26 22 21(L)  Calcium 8.9 - 10.3 mg/dL 10.0 9.8 9.9  Total Protein 6.5 - 8.1 g/dL 7.8 - 7.3  Total Bilirubin 0.3 - 1.2 mg/dL 0.6 - 0.4  Alkaline Phos 38 - 126 U/L 52 - 51  AST 15 - 41 U/L 19 - 22  ALT 0 - 44 U/L 10 - 17      RADIOGRAPHIC STUDIES: I have personally reviewed the radiological images as listed and agreed with the findings in the report. No results found.   ASSESSMENT & PLAN: ADETOKUNBO MCCADDEN is a 62 year old male with anemia in the setting of renal transplant in 2010 and iron deficiency anemia due to blood loss   1. Iron deficiency anemia secondary to chronic blood loss -We reviewed his medical record in detail with the patient. He presented with  mild anemia in the setting of renal transplant and was found to have labs consistent with iron deficiency, likely related to GI blood loss. He has intermittent rectal bleeding with known internal hemorrhoids.  -He was started on oral iron BID in 03/2017, he completed about 4 months of therapy and responded well. He has been off oral iron approximately 5 months, but continues to have sporadic rectal bleeding. Colonoscopy in 2018 without obvious source of bleeding. He has not had EGD.  -He does take daily aspirin, he has significant heart history. I encouraged him to avoid other NSAIDs.  -labs today indicate anemia has resolved, Hgb 14.7, ferritin 47, serum iron 75, TIBC 323, 23% sat. Iron studies remain adequate. Given he continues to have bleeding, we recommend he restart oral iron BID. He does not require IV Feraheme now.  -Will monitor labs closely, q2-3 months. Will f/u in 6 months or sooner if he develops new or worsening bleeding or significant drop in his labs.   2. Renal transplant, 2010 -Managed by nephrology at [facility] with f/u q3-4 months  -Cr and renal function normal today   PLAN: -Labs reviewed -Restart oral iron 1 tab BID -Lab q2-3 months -F/u in 6 months   Orders Placed This Encounter  Procedures  . CBC with Differential (Cancer Center Only)    Standing Status:   Standing    Number of Occurrences:   500    Standing Expiration Date:   11/10/2018  . Iron and TIBC    Standing Status:   Standing    Number of Occurrences:   50    Standing Expiration Date:   11/10/2018  . Ferritin    Standing Status:   Standing    Number of Occurrences:   50    Standing Expiration Date:   11/10/2018  . CMP ([place] only)  Standing Status:   Standing    Number of Occurrences:   50    Standing Expiration Date:   11/10/2018   All questions were answered. The patient knows to call the clinic with any problems, questions or concerns. No barriers to learning was detected.     Alla Feeling, NP 11/09/17   Addendum  I have seen the patient, examined him. I agree with the assessment and and plan and have edited the notes.   Mr Jasinski is here for his anemia f/u. I have reviewed his previously lab results and history. He has iron deficient anemia, and has responded well to oral iron, I encourage him to restart. He states he had GI endoscopy not long ago, will try to get the reports, although he does not remember who did the endoscopy. Will monitor his lab and see him back in 6 months, sooner if needed.  Truitt Merle  11/09/2017

## 2017-11-09 NOTE — Telephone Encounter (Signed)
Scheduled appt per 7/10 los - gave patient AVS and calender per los.  

## 2018-01-03 DIAGNOSIS — G4700 Insomnia, unspecified: Secondary | ICD-10-CM | POA: Diagnosis not present

## 2018-01-03 DIAGNOSIS — R1032 Left lower quadrant pain: Secondary | ICD-10-CM | POA: Diagnosis not present

## 2018-01-03 DIAGNOSIS — I10 Essential (primary) hypertension: Secondary | ICD-10-CM | POA: Diagnosis not present

## 2018-01-03 DIAGNOSIS — E785 Hyperlipidemia, unspecified: Secondary | ICD-10-CM | POA: Diagnosis not present

## 2018-01-03 DIAGNOSIS — Z940 Kidney transplant status: Secondary | ICD-10-CM | POA: Diagnosis not present

## 2018-01-12 DIAGNOSIS — Z940 Kidney transplant status: Secondary | ICD-10-CM | POA: Diagnosis not present

## 2018-01-12 DIAGNOSIS — N390 Urinary tract infection, site not specified: Secondary | ICD-10-CM | POA: Diagnosis not present

## 2018-01-12 DIAGNOSIS — E7800 Pure hypercholesterolemia, unspecified: Secondary | ICD-10-CM | POA: Diagnosis not present

## 2018-01-13 ENCOUNTER — Inpatient Hospital Stay: Payer: Medicare Other | Attending: Nurse Practitioner

## 2018-01-13 DIAGNOSIS — D500 Iron deficiency anemia secondary to blood loss (chronic): Secondary | ICD-10-CM | POA: Insufficient documentation

## 2018-01-13 DIAGNOSIS — K625 Hemorrhage of anus and rectum: Secondary | ICD-10-CM | POA: Insufficient documentation

## 2018-01-20 ENCOUNTER — Inpatient Hospital Stay: Payer: Medicare Other

## 2018-01-20 DIAGNOSIS — K625 Hemorrhage of anus and rectum: Secondary | ICD-10-CM | POA: Diagnosis not present

## 2018-01-20 DIAGNOSIS — D500 Iron deficiency anemia secondary to blood loss (chronic): Secondary | ICD-10-CM | POA: Diagnosis not present

## 2018-01-20 DIAGNOSIS — D508 Other iron deficiency anemias: Secondary | ICD-10-CM

## 2018-01-20 LAB — CBC WITH DIFFERENTIAL (CANCER CENTER ONLY)
Basophils Absolute: 0 10*3/uL (ref 0.0–0.1)
Basophils Relative: 1 %
Eosinophils Absolute: 0.1 10*3/uL (ref 0.0–0.5)
Eosinophils Relative: 2 %
HCT: 42.6 % (ref 38.4–49.9)
Hemoglobin: 13.9 g/dL (ref 13.0–17.1)
LYMPHS ABS: 0.9 10*3/uL (ref 0.9–3.3)
LYMPHS PCT: 22 %
MCH: 29.1 pg (ref 27.2–33.4)
MCHC: 32.6 g/dL (ref 32.0–36.0)
MCV: 89.3 fL (ref 79.3–98.0)
MONO ABS: 0.2 10*3/uL (ref 0.1–0.9)
MONOS PCT: 6 %
NEUTROS PCT: 69 %
Neutro Abs: 2.8 10*3/uL (ref 1.5–6.5)
Platelet Count: 176 10*3/uL (ref 140–400)
RBC: 4.77 MIL/uL (ref 4.20–5.82)
RDW: 14.5 % (ref 11.0–14.6)
WBC Count: 4 10*3/uL (ref 4.0–10.3)

## 2018-01-20 LAB — IRON AND TIBC
Iron: 79 ug/dL (ref 42–163)
SATURATION RATIOS: 27 % — AB (ref 42–163)
TIBC: 291 ug/dL (ref 202–409)
UIBC: 212 ug/dL

## 2018-01-20 LAB — FERRITIN: Ferritin: 38 ng/mL (ref 24–336)

## 2018-01-24 ENCOUNTER — Telehealth: Payer: Self-pay

## 2018-01-24 NOTE — Telephone Encounter (Signed)
-----   Message from Alla Feeling, NP sent at 01/24/2018  8:09 AM EDT ----- Please review his labs. He is not anemic, Hgb is normal. Iron studies remain in low-normal range, stable from last month. Please confirm he is taking iron twice daily.  Thanks, Regan Rakers NP

## 2018-01-24 NOTE — Telephone Encounter (Signed)
Spoke with patient per Cira Rue NP with lab results, not anemic hemoglobin is normal.  Iron studies remain in the low-normal range, but stable from last month.  He is not taking the iron supplement as instructed.  Instructed him to take ferrous sulfate one twice daily.  He verbalized an understanding.

## 2018-02-15 ENCOUNTER — Encounter (HOSPITAL_COMMUNITY): Payer: Medicare Other

## 2018-02-15 ENCOUNTER — Ambulatory Visit: Payer: Medicare Other | Admitting: Vascular Surgery

## 2018-02-21 ENCOUNTER — Ambulatory Visit: Payer: Medicare Other | Admitting: Vascular Surgery

## 2018-02-22 ENCOUNTER — Encounter: Payer: Self-pay | Admitting: Vascular Surgery

## 2018-02-28 DIAGNOSIS — D649 Anemia, unspecified: Secondary | ICD-10-CM | POA: Diagnosis not present

## 2018-02-28 DIAGNOSIS — N319 Neuromuscular dysfunction of bladder, unspecified: Secondary | ICD-10-CM | POA: Diagnosis not present

## 2018-02-28 DIAGNOSIS — I129 Hypertensive chronic kidney disease with stage 1 through stage 4 chronic kidney disease, or unspecified chronic kidney disease: Secondary | ICD-10-CM | POA: Diagnosis not present

## 2018-02-28 DIAGNOSIS — Z940 Kidney transplant status: Secondary | ICD-10-CM | POA: Diagnosis not present

## 2018-03-10 ENCOUNTER — Inpatient Hospital Stay: Payer: Medicare Other | Attending: Nurse Practitioner

## 2018-03-10 DIAGNOSIS — D500 Iron deficiency anemia secondary to blood loss (chronic): Secondary | ICD-10-CM | POA: Diagnosis not present

## 2018-03-10 DIAGNOSIS — K625 Hemorrhage of anus and rectum: Secondary | ICD-10-CM | POA: Insufficient documentation

## 2018-03-10 DIAGNOSIS — D508 Other iron deficiency anemias: Secondary | ICD-10-CM

## 2018-03-10 LAB — FERRITIN: Ferritin: 45 ng/mL (ref 24–336)

## 2018-03-10 LAB — IRON AND TIBC
Iron: 73 ug/dL (ref 42–163)
SATURATION RATIOS: 25 % (ref 20–55)
TIBC: 294 ug/dL (ref 202–409)
UIBC: 221 ug/dL (ref 117–376)

## 2018-03-10 LAB — CBC WITH DIFFERENTIAL (CANCER CENTER ONLY)
Abs Immature Granulocytes: 0.03 10*3/uL (ref 0.00–0.07)
BASOS PCT: 1 %
Basophils Absolute: 0 10*3/uL (ref 0.0–0.1)
EOS ABS: 0.1 10*3/uL (ref 0.0–0.5)
Eosinophils Relative: 2 %
HCT: 47.4 % (ref 39.0–52.0)
Hemoglobin: 14.9 g/dL (ref 13.0–17.0)
IMMATURE GRANULOCYTES: 1 %
Lymphocytes Relative: 26 %
Lymphs Abs: 1 10*3/uL (ref 0.7–4.0)
MCH: 28.7 pg (ref 26.0–34.0)
MCHC: 31.4 g/dL (ref 30.0–36.0)
MCV: 91.3 fL (ref 80.0–100.0)
MONOS PCT: 11 %
Monocytes Absolute: 0.4 10*3/uL (ref 0.1–1.0)
NEUTROS PCT: 59 %
Neutro Abs: 2.3 10*3/uL (ref 1.7–7.7)
PLATELETS: 180 10*3/uL (ref 150–400)
RBC: 5.19 MIL/uL (ref 4.22–5.81)
RDW: 14.1 % (ref 11.5–15.5)
WBC Count: 3.9 10*3/uL — ABNORMAL LOW (ref 4.0–10.5)
nRBC: 0 % (ref 0.0–0.2)

## 2018-03-10 LAB — CMP (CANCER CENTER ONLY)
ALBUMIN: 3.9 g/dL (ref 3.5–5.0)
ALT: 12 U/L (ref 0–44)
ANION GAP: 9 (ref 5–15)
AST: 18 U/L (ref 15–41)
Alkaline Phosphatase: 50 U/L (ref 38–126)
BUN: 12 mg/dL (ref 8–23)
CHLORIDE: 105 mmol/L (ref 98–111)
CO2: 26 mmol/L (ref 22–32)
Calcium: 9.9 mg/dL (ref 8.9–10.3)
Creatinine: 1.3 mg/dL — ABNORMAL HIGH (ref 0.61–1.24)
GFR, Est AFR Am: >60 mL/min (ref >60)
GFR, Estimated: 58 mL/min — ABNORMAL LOW (ref >60)
GLUCOSE: 92 mg/dL (ref 70–99)
POTASSIUM: 4.4 mmol/L (ref 3.5–5.1)
Sodium: 140 mmol/L (ref 135–145)
Total Bilirubin: 0.6 mg/dL (ref 0.3–1.2)
Total Protein: 7.5 g/dL (ref 6.5–8.1)

## 2018-03-14 ENCOUNTER — Telehealth: Payer: Self-pay | Admitting: Emergency Medicine

## 2018-03-14 NOTE — Telephone Encounter (Addendum)
Detailed vm left on patients phone regarding this note.   ----- Message from Alla Feeling, NP sent at 03/12/2018  8:59 PM EST ----- Please let him know lab results. He is not currently anemic. Iron studies are in normal range. Continue oral iron. Keep lab and f/u with Dr. Burr Medico in 05/2018.  Thanks, lacie NP

## 2018-03-29 DIAGNOSIS — Z131 Encounter for screening for diabetes mellitus: Secondary | ICD-10-CM | POA: Diagnosis not present

## 2018-03-29 DIAGNOSIS — Z940 Kidney transplant status: Secondary | ICD-10-CM | POA: Diagnosis not present

## 2018-03-29 DIAGNOSIS — I10 Essential (primary) hypertension: Secondary | ICD-10-CM | POA: Diagnosis not present

## 2018-03-29 DIAGNOSIS — Z0110 Encounter for examination of ears and hearing without abnormal findings: Secondary | ICD-10-CM | POA: Diagnosis not present

## 2018-03-29 DIAGNOSIS — Z7689 Persons encountering health services in other specified circumstances: Secondary | ICD-10-CM | POA: Diagnosis not present

## 2018-03-29 DIAGNOSIS — E785 Hyperlipidemia, unspecified: Secondary | ICD-10-CM | POA: Diagnosis not present

## 2018-03-29 DIAGNOSIS — Z23 Encounter for immunization: Secondary | ICD-10-CM | POA: Diagnosis not present

## 2018-03-29 DIAGNOSIS — Z0101 Encounter for examination of eyes and vision with abnormal findings: Secondary | ICD-10-CM | POA: Diagnosis not present

## 2018-03-29 DIAGNOSIS — G4700 Insomnia, unspecified: Secondary | ICD-10-CM | POA: Diagnosis not present

## 2018-04-25 ENCOUNTER — Encounter (HOSPITAL_COMMUNITY): Payer: Medicare Other

## 2018-04-25 ENCOUNTER — Ambulatory Visit: Payer: Medicare Other | Admitting: Vascular Surgery

## 2018-04-27 ENCOUNTER — Encounter: Payer: Self-pay | Admitting: Vascular Surgery

## 2018-05-10 NOTE — Progress Notes (Signed)
Pt left without being seen.   Scott Chavez

## 2018-05-12 ENCOUNTER — Encounter: Payer: Self-pay | Admitting: Hematology

## 2018-05-12 ENCOUNTER — Inpatient Hospital Stay: Payer: Medicare Other | Attending: Nurse Practitioner | Admitting: Hematology

## 2018-05-12 ENCOUNTER — Inpatient Hospital Stay: Payer: Medicare Other

## 2018-05-12 DIAGNOSIS — K625 Hemorrhage of anus and rectum: Secondary | ICD-10-CM | POA: Insufficient documentation

## 2018-05-12 DIAGNOSIS — D508 Other iron deficiency anemias: Secondary | ICD-10-CM

## 2018-05-12 DIAGNOSIS — D500 Iron deficiency anemia secondary to blood loss (chronic): Secondary | ICD-10-CM | POA: Diagnosis not present

## 2018-05-12 LAB — CBC WITH DIFFERENTIAL (CANCER CENTER ONLY)
ABS IMMATURE GRANULOCYTES: 0.01 10*3/uL (ref 0.00–0.07)
Basophils Absolute: 0 10*3/uL (ref 0.0–0.1)
Basophils Relative: 1 %
EOS PCT: 2 %
Eosinophils Absolute: 0.1 10*3/uL (ref 0.0–0.5)
HEMATOCRIT: 46.9 % (ref 39.0–52.0)
HEMOGLOBIN: 15 g/dL (ref 13.0–17.0)
Immature Granulocytes: 0 %
LYMPHS ABS: 1 10*3/uL (ref 0.7–4.0)
LYMPHS PCT: 26 %
MCH: 28.9 pg (ref 26.0–34.0)
MCHC: 32 g/dL (ref 30.0–36.0)
MCV: 90.4 fL (ref 80.0–100.0)
MONO ABS: 0.5 10*3/uL (ref 0.1–1.0)
MONOS PCT: 12 %
NEUTROS ABS: 2.3 10*3/uL (ref 1.7–7.7)
NRBC: 0 % (ref 0.0–0.2)
Neutrophils Relative %: 59 %
Platelet Count: 177 10*3/uL (ref 150–400)
RBC: 5.19 MIL/uL (ref 4.22–5.81)
RDW: 13.5 % (ref 11.5–15.5)
WBC Count: 4 10*3/uL (ref 4.0–10.5)

## 2018-05-12 LAB — CMP (CANCER CENTER ONLY)
ALT: 13 U/L (ref 0–44)
AST: 18 U/L (ref 15–41)
Albumin: 3.9 g/dL (ref 3.5–5.0)
Alkaline Phosphatase: 53 U/L (ref 38–126)
Anion gap: 7 (ref 5–15)
BILIRUBIN TOTAL: 0.5 mg/dL (ref 0.3–1.2)
BUN: 12 mg/dL (ref 8–23)
CO2: 25 mmol/L (ref 22–32)
CREATININE: 1.14 mg/dL (ref 0.61–1.24)
Calcium: 9.6 mg/dL (ref 8.9–10.3)
Chloride: 106 mmol/L (ref 98–111)
GFR, Estimated: >60 mL/min (ref >60)
GLUCOSE: 119 mg/dL — AB (ref 70–99)
Potassium: 4.2 mmol/L (ref 3.5–5.1)
SODIUM: 138 mmol/L (ref 135–145)
TOTAL PROTEIN: 7.4 g/dL (ref 6.5–8.1)

## 2018-05-12 LAB — IRON AND TIBC
Iron: 84 ug/dL (ref 42–163)
Saturation Ratios: 29 % (ref 20–55)
TIBC: 286 ug/dL (ref 202–409)
UIBC: 202 ug/dL (ref 117–376)

## 2018-05-12 LAB — FERRITIN: Ferritin: 23 ng/mL — ABNORMAL LOW (ref 24–336)

## 2018-05-12 NOTE — Progress Notes (Signed)
Scott Chavez NT attempted to find patient in lobby x 3 not found.

## 2018-05-15 ENCOUNTER — Telehealth: Payer: Self-pay | Admitting: Hematology

## 2018-05-15 NOTE — Telephone Encounter (Signed)
No los per 1/10.

## 2018-06-14 DIAGNOSIS — Z940 Kidney transplant status: Secondary | ICD-10-CM | POA: Diagnosis not present

## 2018-06-14 DIAGNOSIS — E785 Hyperlipidemia, unspecified: Secondary | ICD-10-CM | POA: Diagnosis not present

## 2018-06-14 DIAGNOSIS — G4700 Insomnia, unspecified: Secondary | ICD-10-CM | POA: Diagnosis not present

## 2018-06-14 DIAGNOSIS — I10 Essential (primary) hypertension: Secondary | ICD-10-CM | POA: Diagnosis not present

## 2018-07-05 DIAGNOSIS — E785 Hyperlipidemia, unspecified: Secondary | ICD-10-CM | POA: Diagnosis not present

## 2018-07-05 DIAGNOSIS — I10 Essential (primary) hypertension: Secondary | ICD-10-CM | POA: Diagnosis not present

## 2018-07-05 DIAGNOSIS — Z940 Kidney transplant status: Secondary | ICD-10-CM | POA: Diagnosis not present

## 2018-07-05 DIAGNOSIS — B353 Tinea pedis: Secondary | ICD-10-CM | POA: Diagnosis not present

## 2018-07-05 DIAGNOSIS — G4700 Insomnia, unspecified: Secondary | ICD-10-CM | POA: Diagnosis not present

## 2018-07-10 ENCOUNTER — Telehealth: Payer: Self-pay

## 2018-07-10 ENCOUNTER — Other Ambulatory Visit: Payer: Self-pay | Admitting: Cardiology

## 2018-07-10 DIAGNOSIS — I129 Hypertensive chronic kidney disease with stage 1 through stage 4 chronic kidney disease, or unspecified chronic kidney disease: Secondary | ICD-10-CM | POA: Diagnosis not present

## 2018-07-10 DIAGNOSIS — I6523 Occlusion and stenosis of bilateral carotid arteries: Secondary | ICD-10-CM

## 2018-07-10 DIAGNOSIS — Z940 Kidney transplant status: Secondary | ICD-10-CM | POA: Diagnosis not present

## 2018-07-10 DIAGNOSIS — E7800 Pure hypercholesterolemia, unspecified: Secondary | ICD-10-CM | POA: Diagnosis not present

## 2018-07-10 NOTE — Telephone Encounter (Signed)
Yes that will be fine. Should be bilateral carotid duplex that he is due for.

## 2018-07-17 ENCOUNTER — Encounter (HOSPITAL_COMMUNITY): Payer: Self-pay | Admitting: *Deleted

## 2018-07-17 ENCOUNTER — Other Ambulatory Visit: Payer: Self-pay

## 2018-07-17 ENCOUNTER — Emergency Department (HOSPITAL_COMMUNITY): Payer: Medicare Other

## 2018-07-17 ENCOUNTER — Emergency Department (HOSPITAL_COMMUNITY)
Admission: EM | Admit: 2018-07-17 | Discharge: 2018-07-17 | Disposition: A | Discharge status: Home / Self Care | Payer: Medicare Other | Attending: Emergency Medicine | Admitting: Emergency Medicine

## 2018-07-17 DIAGNOSIS — Z209 Contact with and (suspected) exposure to unspecified communicable disease: Secondary | ICD-10-CM | POA: Insufficient documentation

## 2018-07-17 DIAGNOSIS — Z992 Dependence on renal dialysis: Secondary | ICD-10-CM | POA: Insufficient documentation

## 2018-07-17 DIAGNOSIS — N186 End stage renal disease: Secondary | ICD-10-CM | POA: Insufficient documentation

## 2018-07-17 DIAGNOSIS — I70211 Atherosclerosis of native arteries of extremities with intermittent claudication, right leg: Secondary | ICD-10-CM | POA: Diagnosis not present

## 2018-07-17 DIAGNOSIS — R42 Dizziness and giddiness: Secondary | ICD-10-CM | POA: Diagnosis not present

## 2018-07-17 DIAGNOSIS — B9789 Other viral agents as the cause of diseases classified elsewhere: Secondary | ICD-10-CM

## 2018-07-17 DIAGNOSIS — I120 Hypertensive chronic kidney disease with stage 5 chronic kidney disease or end stage renal disease: Secondary | ICD-10-CM | POA: Diagnosis not present

## 2018-07-17 DIAGNOSIS — R05 Cough: Secondary | ICD-10-CM | POA: Diagnosis not present

## 2018-07-17 DIAGNOSIS — Z79899 Other long term (current) drug therapy: Secondary | ICD-10-CM | POA: Diagnosis not present

## 2018-07-17 DIAGNOSIS — Z7982 Long term (current) use of aspirin: Secondary | ICD-10-CM | POA: Diagnosis not present

## 2018-07-17 DIAGNOSIS — J069 Acute upper respiratory infection, unspecified: Secondary | ICD-10-CM

## 2018-07-17 DIAGNOSIS — Z87891 Personal history of nicotine dependence: Secondary | ICD-10-CM | POA: Diagnosis not present

## 2018-07-17 DIAGNOSIS — R531 Weakness: Secondary | ICD-10-CM | POA: Diagnosis not present

## 2018-07-17 LAB — RESPIRATORY PANEL BY PCR
ADENOVIRUS-RVPPCR: NOT DETECTED
Bordetella pertussis: NOT DETECTED
CORONAVIRUS NL63-RVPPCR: NOT DETECTED
Chlamydophila pneumoniae: NOT DETECTED
Coronavirus 229E: NOT DETECTED
Coronavirus HKU1: NOT DETECTED
Coronavirus OC43: NOT DETECTED
Influenza A: NOT DETECTED
Influenza B: NOT DETECTED
MYCOPLASMA PNEUMONIAE-RVPPCR: NOT DETECTED
Metapneumovirus: NOT DETECTED
Parainfluenza Virus 1: NOT DETECTED
Parainfluenza Virus 2: NOT DETECTED
Parainfluenza Virus 3: NOT DETECTED
Parainfluenza Virus 4: NOT DETECTED
Respiratory Syncytial Virus: NOT DETECTED
Rhinovirus / Enterovirus: NOT DETECTED

## 2018-07-17 LAB — CBC WITH DIFFERENTIAL/PLATELET
Abs Immature Granulocytes: 0.02 10*3/uL (ref 0.00–0.07)
BASOS ABS: 0 10*3/uL (ref 0.0–0.1)
Basophils Relative: 1 %
Eosinophils Absolute: 0.1 10*3/uL (ref 0.0–0.5)
Eosinophils Relative: 2 %
HCT: 43.7 % (ref 39.0–52.0)
Hemoglobin: 13.8 g/dL (ref 13.0–17.0)
Immature Granulocytes: 1 %
Lymphocytes Relative: 16 %
Lymphs Abs: 0.6 10*3/uL — ABNORMAL LOW (ref 0.7–4.0)
MCH: 28 pg (ref 26.0–34.0)
MCHC: 31.6 g/dL (ref 30.0–36.0)
MCV: 88.6 fL (ref 80.0–100.0)
Monocytes Absolute: 0.5 10*3/uL (ref 0.1–1.0)
Monocytes Relative: 13 %
NEUTROS ABS: 2.6 10*3/uL (ref 1.7–7.7)
Neutrophils Relative %: 67 %
Platelets: 157 10*3/uL (ref 150–400)
RBC: 4.93 MIL/uL (ref 4.22–5.81)
RDW: 13.5 % (ref 11.5–15.5)
WBC: 3.8 10*3/uL — ABNORMAL LOW (ref 4.0–10.5)
nRBC: 0 % (ref 0.0–0.2)

## 2018-07-17 LAB — BASIC METABOLIC PANEL
ANION GAP: 7 (ref 5–15)
BUN: 17 mg/dL (ref 8–23)
CO2: 22 mmol/L (ref 22–32)
Calcium: 9.7 mg/dL (ref 8.9–10.3)
Chloride: 109 mmol/L (ref 98–111)
Creatinine, Ser: 1.22 mg/dL (ref 0.61–1.24)
GFR calc non Af Amer: >60 mL/min (ref >60)
Glucose, Bld: 110 mg/dL — ABNORMAL HIGH (ref 70–99)
Potassium: 4 mmol/L (ref 3.5–5.1)
Sodium: 138 mmol/L (ref 135–145)

## 2018-07-17 LAB — INFLUENZA PANEL BY PCR (TYPE A & B)
Influenza A By PCR: NEGATIVE
Influenza B By PCR: NEGATIVE

## 2018-07-17 LAB — I-STAT TROPONIN, ED: Troponin i, poc: 0 ng/mL (ref 0.00–0.08)

## 2018-07-17 LAB — TROPONIN I: Troponin I: <0.03 ng/mL (ref <0.03)

## 2018-07-17 MED ORDER — ALBUTEROL SULFATE HFA 108 (90 BASE) MCG/ACT IN AERS: 1.0000 | INHALATION_SPRAY | Freq: Four times a day (QID) | RESPIRATORY_TRACT | 0 refills | Status: DC | PRN

## 2018-07-17 MED ORDER — DOXYCYCLINE HYCLATE 100 MG PO CAPS: 100.0000 mg | ORAL_CAPSULE | Freq: Two times a day (BID) | ORAL | 0 refills | Status: DC

## 2018-07-17 NOTE — Discharge Instructions (Signed)
You likely have a viral illness causing your cough but you will be given antibiotics due to your transplant history.  There is no pneumonia on your chest x-ray.  Flu test is negative.  Do your best to stop smoking and vaping.  Follow-up with your doctor.  Return to the ED with chest pain, shortness of breath or other concerns.

## 2018-07-17 NOTE — ED Triage Notes (Signed)
The pt is c/o a cold and cough for several days productive cough  No temp

## 2018-07-17 NOTE — ED Provider Notes (Signed)
[facility] EMERGENCY DEPARTMENT Provider Note   CSN: [id number] Arrival date & time: 07/17/18  0102    History   Chief Complaint Chief Complaint  Patient presents with  . Cough    HPI Scott Chavez is a 63 y.o. male.      Patient with history of ESRD status post kidney transplant in 2010, GERD, peripheral vascular disease, hepatitis C, hypertension presenting with a 3-day history of nasal congestion and cough productive of clear mucus.  States he is also had some nasal congestion, runny nose and sore throat.  Has not checked his temperature but does not know of any fever.  He has been around multiple sick people but not travel outside the country.  No known coronavirus exposures.  He is no longer on dialysis.  He reports he has brief episodes of chest pain with coughing only lasts for a few seconds and go away.  He denies any abdominal pain, nausea, vomiting, diarrhea.  He continues to self catheterize without difficulty.  He is uncertain if he received a flu shot this season. He also complains of mild headache and body aches as well as sore throat and congestion.  He states he is mainly here because he needs a note for work.  The history is provided by the patient.  Cough  Associated symptoms: chills, headaches and myalgias   Associated symptoms: no chest pain, no fever, no rash, no rhinorrhea and no shortness of breath     Past Medical History:  Diagnosis Date  . ADHD   . Anemia    pt. not sure, but reports he is taking Fe for one month now.  . Aorto-iliac disease ([place])   . Bladder dysfunction   . ESRD (end stage renal disease) [facility])    transplant- [facility] , followed by Dr. Moshe Cipro   . GERD (gastroesophageal reflux disease)   . Hemodialysis patient [facility])    "on dialysis 1998-2010" 04/25/2013)  . Hepatitis C    has been treated with Harvoni  . History of renal failure   . Hypercholesteremia   . Hypertension   . Internal  hemorrhoids    Colonoscopy 2010 and anoscopy 2015  . Peripheral vascular disease ([place])   . Pneumonia   . Self-catheterizes urinary bladder    pt. choice, but he reports that he desires to do this to avoid retention     Patient Active Problem List   Diagnosis Date Noted  . Iron deficiency anemia 04/06/2017  . Peripheral arterial disease ([place]) 10/25/2016  . Chronic neck pain 03/15/2016  . Chronic low back pain without sciatica 03/15/2016  . Nonallopathic lesion of lumbosacral region 03/15/2016  . Nonallopathic lesion of sacral region 03/15/2016  . Nonallopathic lesion of thoracic region 03/15/2016  . Liver fibrosis 12/18/2015  . Arteriovenous fistula thrombosis ([place]) 10/16/2015  . Chronic hepatitis C without hepatic coma ([place]) 09/03/2015  . Atherosclerosis of native arteries of extremities with intermittent claudication, right leg ([place]) 04/17/2015  . Pain in joint, lower leg 09/13/2014  . Hemorrhoids, internal, with bleeding 01/29/2014  . Pain in limb 08/17/2013  . Peripheral vascular disease, unspecified ([place]) 06/08/2013  . Redness of skin-Calf to foot LEFT 05/25/2013  . Tenderness in limb-Calf to foot LEFT 05/25/2013  . Swelling of limb-Calf to foot LEFT 05/25/2013  . Postoperative pain 05/02/2013  . PAD (peripheral artery disease) ([place]) 04/24/2013  . Preop cardiovascular exam 03/27/2013  . Essential hypertension 03/27/2013  . Hyperlipidemia 03/27/2013  . Atherosclerosis of  native arteries of extremity with intermittent claudication ([place]) 02/16/2013  . History of renal transplant 02/16/2013    Past Surgical History:  Procedure Laterality Date  . ABDOMINAL AORTAGRAM N/A 03/09/2013   Procedure: ABDOMINAL Maxcine Ham;  Surgeon: Elam Dutch, MD;  Location: [facility] CATH LAB;  Service: Cardiovascular;  Laterality: N/A;  . AV FISTULA PLACEMENT Left 1998   "removed 2011" (04/25/2013)  . AV FISTULA REPAIR Left    removal with partial vein removed  . COLONOSCOPY    . ENDARTERECTOMY  FEMORAL Right 04/17/2015   Procedure: ENDARTERECTOMY RIGHT ILIOFEMORAL WITH PROFUNDOPLASTY;  Surgeon: Conrad Aldan, MD;  Location: [place];  Service: Vascular;  Laterality: Right;  . ENDARTERECTOMY FEMORAL Right 10/25/2016   Procedure: RIGHT ILIOFEMORAL ENDARTERECTOMY WITH BOVINE PERICARDIAL PATCH ANGIOPLASTY;  Surgeon: Conrad [place], MD;  Location: [place];  Service: Vascular;  Laterality: Right;  . FEMORAL-POPLITEAL BYPASS GRAFT Left 04/24/2013   Procedure: BYPASS GRAFT COMMON FEMORALARTERY-BELOW KNEE POPLITEAL ARTERY WITH GREATER SAPHENOUS VEIN;  Surgeon: Conrad Cowgill, MD;  Location: [place];  Service: Vascular;  Laterality: Left;  . FEMORAL-POPLITEAL BYPASS GRAFT Right 04/17/2015   Procedure: BYPASS GRAFT RIGHT FEMORALTO BELOW KNEE POPLITEAL ARTERY USING RIGHT NON REVERSED GREATER SAPPHENOUS VEIN;  Surgeon: Conrad Steptoe, MD;  Location: [place];  Service: Vascular;  Laterality: Right;  . INTRAOPERATIVE ARTERIOGRAM Left 04/24/2013   Procedure: INTRA OPERATIVE ARTERIOGRAM;  Surgeon: Conrad [place], MD;  Location: [place];  Service: Vascular;  Laterality: Left;  . KIDNEY TRANSPLANT    . LOWER EXTREMITY ANGIOGRAM Bilateral 03/09/2013   Procedure: LOWER EXTREMITY ANGIOGRAM;  Surgeon: Elam Dutch, MD;  Location: [facility] CATH LAB;  Service: Cardiovascular;  Laterality: Bilateral;  . LOWER EXTREMITY ANGIOGRAM Right 10/25/2013   Procedure: LOWER EXTREMITY ANGIOGRAM;  Surgeon: Conrad [place], MD;  Location: [facility] CATH LAB;  Service: Cardiovascular;  Laterality: Right;  . LOWER EXTREMITY ANGIOGRAM Right 12/13/2013   Procedure: LOWER EXTREMITY ANGIOGRAM;  Surgeon: Conrad Guthrie, MD;  Location: [facility] CATH LAB;  Service: Cardiovascular;  Laterality: Right;  . NEPHRECTOMY RECIPIENT  2010  . NEPHRECTOMY TRANSPLANTED ORGAN    . PATCH ANGIOPLASTY Right 04/17/2015   Procedure: Rueben Bash PATCH ANGIOPLASTY, RIGHT ILEOFEMORAL;  Surgeon: Conrad [place], MD;  Location: [place];  Service: Vascular;  Laterality: Right;  . PERIPHERAL VASCULAR  CATHETERIZATION N/A 04/10/2015   Procedure: Abdominal Aortogram;  Surgeon: Conrad [place], MD;  Location: [place] CV LAB;  Service: Cardiovascular;  Laterality: N/A;  . PERIPHERAL VASCULAR CATHETERIZATION N/A 05/10/2016   Procedure: Abdominal Aortogram w/Lower Extremity;  Surgeon: Conrad [place], MD;  Location: [place] CV LAB;  Service: Cardiovascular;  Laterality: N/A;  . s/p cadaveric renal transplant  December 2010   [place] Left 09/24/2015   Procedure: EXCISION OF THROMBOSED RADIOCEPHALIC ARTERIOVENOUS FISTULA;  Surgeon: Conrad [place], MD;  Location: [place];  Service: Vascular;  Laterality: Left;  Marland Kitchen VEIN HARVEST Right 04/17/2015   Procedure: RIGHT GREATER SAPPHENOUS VEIN HARVEST ;  Surgeon: Conrad , MD;  Location: [place];  Service: Vascular;  Laterality: Right;        Home Medications    Prior to Admission medications   Medication Sig Start Date End Date Taking? Authorizing Provider  aspirin EC 81 MG tablet Take 81-162 mg by mouth daily.    [provider]  Cholecalciferol (VITAMIN D3) 5000 units CAPS Take 5,000 Units by mouth 2 (two) times daily.    [provider]  citalopram (CELEXA) 40 MG tablet Take 20 mg by mouth 2 (two) times daily.  02/18/17   [provider]  Cyanocobalamin (VITAMIN B-12 PO) Take 1 tablet by mouth daily.    [provider]  diphenhydramine-acetaminophen (TYLENOL PM) 25-500 MG TABS tablet Take 1 tablet by mouth at bedtime as needed (for pain or sleep).     [provider]  ferrous sulfate 325 (65 FE) MG EC tablet Take 1 tablet (325 mg total) by mouth 2 (two) times daily. 06/10/17   Ardath Sax, MD  HYDROcodone-acetaminophen (NORCO/VICODIN) 5-325 MG tablet Take 1 tablet by mouth every 6 (six) hours as needed. Patient not taking: Reported on 11/09/2017 09/10/17   Dorie Rank, MD  ketotifen (ZADITOR) 0.025 % ophthalmic solution Place 1 drop into both eyes 2 (two) times daily as  needed (for seasonal allergies).    [provider]  lidocaine (LIDODERM) 5 % Place 1 patch onto the skin daily. Remove & Discard patch within 12 hours or as directed by MD 11/19/16   Conrad [place], MD  lisdexamfetamine (VYVANSE) 20 MG capsule Take 20 mg by mouth 2 (two) times daily.     [provider]  metoprolol succinate (TOPROL-XL) 25 MG 24 hr tablet Take 25 mg by mouth daily. 08/16/17   [provider]  mycophenolate (CELLCEPT) 250 MG capsule Take 250-500 mg by mouth See admin instructions. Take 500 mg by mouth in the morning and 250 mg in the evening    [provider]  naproxen (NAPROSYN) 500 MG tablet Take 1 tablet (500 mg total) by mouth 2 (two) times daily with a meal. As needed for pain 09/10/17   Dorie Rank, MD  NIFEdipine (PROCARDIA-XL/ADALAT-CC/NIFEDICAL-XL) 30 MG 24 hr tablet Take 30 mg by mouth daily.     [provider]  omeprazole (PRILOSEC) 20 MG capsule Take 20 mg by mouth daily. Reported on 06/06/2015    [provider]  pregabalin (LYRICA) 50 MG capsule Take 1 capsule (50 mg total) by mouth 3 (three) times daily. 01/08/16   Conrad [place], MD  Pyridoxine HCl (VITAMIN B-6 PO) Take 1 tablet by mouth 3 (three) times a week.     [provider]  rosuvastatin (CRESTOR) 5 MG tablet Take 5 mg by mouth at bedtime.     [provider]  sodium chloride (OCEAN) 0.65 % SOLN nasal spray Place 1 spray into both nostrils as needed (swelling). 10/02/16   Ashley Murrain, NP  tacrolimus (PROGRAF) 1 MG capsule Take 3 mg by mouth 2 (two) times daily.     [provider]  tadalafil (CIALIS) 20 MG tablet Take 20 mg by mouth daily as needed for erectile dysfunction.    [provider]  tamsulosin (FLOMAX) 0.4 MG CAPS capsule Take 0.4 mg by mouth daily.    [provider]  zolpidem (AMBIEN) 10 MG tablet Take 10 mg by mouth at bedtime.     [provider]    Family History Family History  Problem  Relation Age of Onset  . Hypertension Father   . Other Father        amputation  . Hypertension Mother     Social History Social History   Tobacco Use  . Smoking status: Former Smoker    Packs/day: 0.50    Years: 34.00    Pack years: 17.00    Types: E-cigarettes    Last attempt to quit: 02/17/2003    Years since quitting: 15.4  .  Smokeless tobacco: Former Systems developer  . Tobacco comment: vapor cigrarette  Substance Use Topics  . Alcohol use: No    Alcohol/week: 0.0 standard drinks  . Drug use: No    Types: Cocaine, Marijuana, Heroin    Comment: 04/25/2013 "abused in the past ; stopped  11/13/2002     Allergies   Tape   Review of Systems Review of Systems  Constitutional: Positive for chills. Negative for activity change, appetite change and fever.  HENT: Negative for congestion and rhinorrhea.   Eyes: Negative for visual disturbance.  Respiratory: Positive for cough. Negative for chest tightness and shortness of breath.   Cardiovascular: Negative for chest pain.  Gastrointestinal: Negative for abdominal pain, nausea and vomiting.  Genitourinary: Negative for dysuria, frequency, hematuria and urgency.  Musculoskeletal: Positive for arthralgias and myalgias.  Skin: Negative for rash.  Neurological: Positive for weakness, light-headedness and headaches.   all other systems are negative except as noted in the HPI and PMH.    Physical Exam Updated Vital Signs BP (!) 143/76   Pulse 61   Temp 98.9 F (37.2 C)   Resp 13   Ht 5\' 10"  (1.778 m)   Wt 69.4 kg   SpO2 99%   BMI 21.95 kg/m   Physical Exam Vitals signs and nursing note reviewed.  Constitutional:      General: He is not in acute distress.    Appearance: He is well-developed.     Comments: No distress, speaking full sentences  HENT:     Head: Normocephalic and atraumatic.     Right Ear: Tympanic membrane normal.     Left Ear: Tympanic membrane normal.     Nose: Congestion present.     Mouth/Throat:      Mouth: Mucous membranes are moist.     Pharynx: No oropharyngeal exudate.  Eyes:     Conjunctiva/sclera: Conjunctivae normal.     Pupils: Pupils are equal, round, and reactive to light.  Neck:     Musculoskeletal: Normal range of motion and neck supple.     Comments: No meningismus. Cardiovascular:     Rate and Rhythm: Normal rate and regular rhythm.     Heart sounds: Normal heart sounds. No murmur.  Pulmonary:     Effort: Pulmonary effort is normal. No respiratory distress.     Breath sounds: Normal breath sounds.  Abdominal:     Palpations: Abdomen is soft.     Tenderness: There is no abdominal tenderness. There is no guarding or rebound.  Musculoskeletal: Normal range of motion.        General: No tenderness.     Right lower leg: No edema.     Left lower leg: No edema.  Skin:    General: Skin is warm.     Capillary Refill: Capillary refill takes less than 2 seconds.     Findings: No rash.  Neurological:     General: No focal deficit present.     Mental Status: He is alert and oriented to person, place, and time. Mental status is at baseline.     Cranial Nerves: No cranial nerve deficit.     Motor: No abnormal muscle tone.     Coordination: Coordination normal.     Comments:  5/5 strength throughout. CN 2-12 intact.Equal grip strength.   Psychiatric:        Behavior: Behavior normal.      ED Treatments / Results  Labs (all labs ordered are listed, but only abnormal results are displayed) Labs  Reviewed  CBC WITH DIFFERENTIAL/PLATELET - Abnormal; Notable for the following components:      Result Value   WBC 3.8 (*)    Lymphs Abs 0.6 (*)    All other components within normal limits  BASIC METABOLIC PANEL - Abnormal; Notable for the following components:   Glucose, Bld 110 (*)    All other components within normal limits  RESPIRATORY PANEL BY PCR  TROPONIN I  INFLUENZA PANEL BY PCR (TYPE A & B)  I-STAT TROPONIN, ED    EKG EKG Interpretation  Date/Time:  Monday  July 17 2018 05:55:08 EDT Ventricular Rate:  53 PR Interval:    QRS Duration: 95 QT Interval:  429 QTC Calculation: 403 R Axis:   -49 Text Interpretation:  Sinus rhythm Probable left atrial enlargement Left anterior fascicular block RSR' in V1 or V2, right VCD or RVH Left ventricular hypertrophy Minimal ST elevation, lateral leads No significant change was found Confirmed by Ezequiel Essex [phone]) on 07/17/2018 6:03:30 AM   Radiology Dg Chest 2 View  Result Date: 07/17/2018 CLINICAL DATA:  Cough for 4 days. EXAM: CHEST - 2 VIEW COMPARISON:  09/10/2017 FINDINGS: The heart size and mediastinal contours are within normal limits. Both lungs are clear. The visualized skeletal structures are unremarkable. IMPRESSION: No active cardiopulmonary disease. Electronically Signed   By: Ulyses Jarred M.D.   On: 07/17/2018 01:51    Procedures Procedures (including critical care time)  Medications Ordered in ED Medications - No data to display   Initial Impression / Assessment and Plan / ED Course  I have reviewed the triage vital signs and the nursing notes.  Pertinent labs & imaging results that were available during my care of the patient were reviewed by me and considered in my medical decision making (see chart for details).       Transplant patient presenting with a several day history of nasal congestion and cough that is productive of clear mucus.  No documented fevers.  Well appearing without hypoxia or increased work of breathing.  Discussed with infection prevention Angie.  Patient with no fever or hypoxia or increased work of breathing.  Low suspicion for coronavirus at this point despite his immunocompromised state and will not test per infection prevention recommendations.  CXR negative.  Flu swab negative.  EKG with LVH, troponin negative x2. Chest pain description atypical for ACS, and only present with coughing.  Will treat supportively for likely viral URI with  bronchodilators. However with history of transplant, will cover with empiric antibiotics.  Followup with PCP.  Return precautions discussed.  Final Clinical Impressions(s) / ED Diagnoses   Final diagnoses:  Viral URI with cough    ED Discharge Orders    None       Joleen Stuckert, Annie Main, MD 07/17/18 [phone]

## 2018-07-21 ENCOUNTER — Other Ambulatory Visit: Payer: Medicare Other

## 2018-07-21 ENCOUNTER — Other Ambulatory Visit: Payer: Self-pay

## 2018-08-01 ENCOUNTER — Other Ambulatory Visit: Payer: Self-pay

## 2018-08-01 ENCOUNTER — Emergency Department (HOSPITAL_COMMUNITY): Payer: Medicare Other

## 2018-08-01 ENCOUNTER — Emergency Department (HOSPITAL_COMMUNITY)
Admission: EM | Admit: 2018-08-01 | Discharge: 2018-08-02 | Disposition: A | Discharge status: Home / Self Care | Payer: Medicare Other | Attending: Emergency Medicine | Admitting: Emergency Medicine

## 2018-08-01 DIAGNOSIS — I10 Essential (primary) hypertension: Secondary | ICD-10-CM | POA: Insufficient documentation

## 2018-08-01 DIAGNOSIS — R0602 Shortness of breath: Secondary | ICD-10-CM | POA: Diagnosis not present

## 2018-08-01 DIAGNOSIS — F909 Attention-deficit hyperactivity disorder, unspecified type: Secondary | ICD-10-CM | POA: Diagnosis not present

## 2018-08-01 DIAGNOSIS — R079 Chest pain, unspecified: Secondary | ICD-10-CM | POA: Diagnosis present

## 2018-08-01 DIAGNOSIS — Z87891 Personal history of nicotine dependence: Secondary | ICD-10-CM | POA: Diagnosis not present

## 2018-08-01 DIAGNOSIS — Z7982 Long term (current) use of aspirin: Secondary | ICD-10-CM | POA: Diagnosis not present

## 2018-08-01 DIAGNOSIS — R0781 Pleurodynia: Secondary | ICD-10-CM | POA: Insufficient documentation

## 2018-08-01 DIAGNOSIS — Z79899 Other long term (current) drug therapy: Secondary | ICD-10-CM | POA: Insufficient documentation

## 2018-08-01 MED ORDER — HYDROCODONE-ACETAMINOPHEN 5-325 MG PO TABS
1.0000 | ORAL_TABLET | Freq: Once | ORAL | Status: AC
  Administered 2018-08-01: 1 via ORAL
  Filled 2018-08-01: qty 1

## 2018-08-01 NOTE — ED Provider Notes (Signed)
[facility] EMERGENCY DEPARTMENT Provider Note   CSN: [id number] Arrival date & time: 08/01/18  2244    History   Chief Complaint Chief Complaint  Patient presents with  . Chest Pain    HPI Scott Chavez is a 63 y.o. male.     Patient with history of ESRD status post kidney transplant in 2010, GERD, peripheral vascular disease, hepatitis C, hypertension presenting with a 3-day history of left lower rib pain that is worse with palpation and movement.  This pain is constant.  He denies any specific injury.  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  Is not take anything for it.  The pain does not radiate.  There is some associated shortness of breath.  There is been no fever.  He has a persistent cough when he was seen in the ED on March 16 treated supportively for likely viral URI.  No fevers or recent traveling outside the country.  No known coronavirus exposures.  Patient came in tonight because he is having increased pain at work and wanted to get checked out. No abdominal pain, nausea, vomiting.  The history is provided by the patient.  Chest Pain  Associated symptoms: shortness of breath   Associated symptoms: no abdominal pain, no dizziness, no headache, no nausea, no vomiting and no weakness     Past Medical History:  Diagnosis Date  . ADHD   . Anemia    pt. not sure, but reports he is taking Fe for one month now.  . Aorto-iliac disease ([place])   . Bladder dysfunction   . ESRD (end stage renal disease) [facility])    transplant- [facility]/2010[facility] , followed by Dr. Moshe Cipro   . GERD (gastroesophageal reflux disease)   . Hemodialysis patient [facility], [facility])    "on dialysis 1998-2010" 04/25/2013)  . Hepatitis C    has been treated with Harvoni  . History of renal failure   . Hypercholesteremia   . Hypertension   . Internal hemorrhoids    Colonoscopy 2010 and anoscopy 2015  . Peripheral  vascular disease ([place])   . Pneumonia   . Self-catheterizes urinary bladder    pt. choice, but he reports that he desires to do this to avoid retention     Patient Active Problem List   Diagnosis Date Noted  . Iron deficiency anemia 04/06/2017  . Peripheral arterial disease ([place]) 10/25/2016  . Chronic neck pain 03/15/2016  . Chronic low back pain without sciatica 03/15/2016  . Nonallopathic lesion of lumbosacral region 03/15/2016  . Nonallopathic lesion of sacral region 03/15/2016  . Nonallopathic lesion of thoracic region 03/15/2016  . Liver fibrosis 12/18/2015  . Arteriovenous fistula thrombosis ([place]) 10/16/2015  . Chronic hepatitis C without hepatic coma ([place]) 09/03/2015  . Atherosclerosis of native arteries of extremities with intermittent claudication, right leg ([place]) 04/17/2015  . Pain in joint, lower leg 09/13/2014  . Hemorrhoids, internal, with bleeding 01/29/2014  . Pain in limb 08/17/2013  . Peripheral vascular disease, unspecified ([place]) 06/08/2013  . Redness of skin-Calf to foot LEFT 05/25/2013  . Tenderness in limb-Calf to foot LEFT 05/25/2013  . Swelling of limb-Calf to foot LEFT 05/25/2013  . Postoperative pain 05/02/2013  . PAD (peripheral artery disease) ([place]) 04/24/2013  . Preop cardiovascular exam 03/27/2013  . Essential hypertension 03/27/2013  . Hyperlipidemia 03/27/2013  . Atherosclerosis of native arteries of extremity with intermittent claudication ([place]) 02/16/2013  .  History of renal transplant 02/16/2013    Past Surgical History:  Procedure Laterality Date  . ABDOMINAL AORTAGRAM N/A 03/09/2013   Procedure: ABDOMINAL Maxcine Ham;  Surgeon: Elam Dutch, MD;  Location: [facility] CATH LAB;  Service: Cardiovascular;  Laterality: N/A;  . AV FISTULA PLACEMENT Left 1998   "removed 2011" (04/25/2013)  . AV FISTULA REPAIR Left    removal with partial vein removed  . COLONOSCOPY    . ENDARTERECTOMY FEMORAL Right 04/17/2015   Procedure: ENDARTERECTOMY RIGHT  ILIOFEMORAL WITH PROFUNDOPLASTY;  Surgeon: Conrad Nordic, MD;  Location: [place];  Service: Vascular;  Laterality: Right;  . ENDARTERECTOMY FEMORAL Right 10/25/2016   Procedure: RIGHT ILIOFEMORAL ENDARTERECTOMY WITH BOVINE PERICARDIAL PATCH ANGIOPLASTY;  Surgeon: Conrad Fairview, MD;  Location: [place];  Service: Vascular;  Laterality: Right;  . FEMORAL-POPLITEAL BYPASS GRAFT Left 04/24/2013   Procedure: BYPASS GRAFT COMMON FEMORALARTERY-BELOW KNEE POPLITEAL ARTERY WITH GREATER SAPHENOUS VEIN;  Surgeon: Conrad Hoskins, MD;  Location: [place];  Service: Vascular;  Laterality: Left;  . FEMORAL-POPLITEAL BYPASS GRAFT Right 04/17/2015   Procedure: BYPASS GRAFT RIGHT FEMORALTO BELOW KNEE POPLITEAL ARTERY USING RIGHT NON REVERSED GREATER SAPPHENOUS VEIN;  Surgeon: Conrad Scotland, MD;  Location: [place];  Service: Vascular;  Laterality: Right;  . INTRAOPERATIVE ARTERIOGRAM Left 04/24/2013   Procedure: INTRA OPERATIVE ARTERIOGRAM;  Surgeon: Conrad Shenorock, MD;  Location: [place];  Service: Vascular;  Laterality: Left;  . KIDNEY TRANSPLANT    . LOWER EXTREMITY ANGIOGRAM Bilateral 03/09/2013   Procedure: LOWER EXTREMITY ANGIOGRAM;  Surgeon: Elam Dutch, MD;  Location: [facility] CATH LAB;  Service: Cardiovascular;  Laterality: Bilateral;  . LOWER EXTREMITY ANGIOGRAM Right 10/25/2013   Procedure: LOWER EXTREMITY ANGIOGRAM;  Surgeon: Conrad [place], MD;  Location: [facility] CATH LAB;  Service: Cardiovascular;  Laterality: Right;  . LOWER EXTREMITY ANGIOGRAM Right 12/13/2013   Procedure: LOWER EXTREMITY ANGIOGRAM;  Surgeon: Conrad Viborg, MD;  Location: [facility] C[facility] LAB;  Service: Cardiovascular;  Laterality: Right;  . NEPHRECTOMY RECIPIENT  2010  . NEPHRECTOMY TRANSPLANTED ORGAN    . PATCH ANGIOPLASTY Right 04/17/2015   Procedure: Rueben Bash PATCH ANGIOPLASTY, RIGHT ILEOFEMORAL;  Surgeon: Conrad [place], MD;  Location: [place];  Service: Vascular;  Laterality: Right;  . PERIPHERAL VASCULAR CATHETERIZATION N/A 04/10/2015   Procedure: Abdominal Aortogram;   Surgeon: Conrad Newport, MD;  Location: [place] CV LAB;  Service: Cardiovascular;  Laterality: N/A;  . PERIPHERAL VASCULAR CATHETERIZATION N/A 05/10/2016   Procedure: Abdominal Aortogram w/Lower Extremity;  Surgeon: Conrad La Russell, MD;  Location: [place] CV LAB;  Service: Cardiovascular;  Laterality: N/A;  . s/p cadaveric renal transplant  December 2010   [place] Left 09/24/2015   Procedure: EXCISION OF THROMBOSED RADIOCEPHALIC ARTERIOVENOUS FISTULA;  Surgeon: Conrad Osceola, MD;  Location: [place];  Service: Vascular;  Laterality: Left;  Marland Kitchen VEIN HARVEST Right 04/17/2015   Procedure: RIGHT GREATER SAPPHENOUS VEIN HARVEST ;  Surgeon: Conrad [place], MD;  Location: [place];  Service: Vascular;  Laterality: Right;        Home Medications    Prior to Admission medications   Medication Sig Start Date End Date Taking? Authorizing Provider  albuterol (PROVENTIL HFA;VENTOLIN HFA) 108 (90 Base) MCG/ACT inhaler Inhale 1-2 puffs into the lungs every 6 (six) hours as needed for wheezing or shortness of breath. 07/17/18   Shanise Balch, Annie Main, MD  aspirin EC 81 MG tablet Take 81-162 mg by mouth daily.    [provider]  Cholecalciferol (VITAMIN D3) 5000 units CAPS Take 5,000 Units by mouth 2 (two) times daily.    [provider]  citalopram (CELEXA) 40 MG tablet Take 20 mg by mouth 2 (two) times daily.  02/18/17   [provider]  Cyanocobalamin (VITAMIN B-12 PO) Take 1 tablet by mouth daily.    [provider]  diphenhydramine-acetaminophen (TYLENOL PM) 25-500 MG TABS tablet Take 1 tablet by mouth at bedtime as needed (for pain or sleep).     [provider]  doxycycline (VIBRAMYCIN) 100 MG capsule Take 1 capsule (100 mg total) by mouth 2 (two) times daily. 07/17/18   Abbygale Lapid, Annie Main, MD  ferrous sulfate 325 (65 FE) MG EC tablet Take 1 tablet (325 mg total) by mouth 2 (two) times daily. 06/10/17   Ardath Sax, MD   HYDROcodone-acetaminophen (NORCO/VICODIN) 5-325 MG tablet Take 1 tablet by mouth every 6 (six) hours as needed. Patient not taking: Reported on 11/09/2017 09/10/17   Dorie Rank, MD  ketotifen (ZADITOR) 0.025 % ophthalmic solution Place 1 drop into both eyes 2 (two) times daily as needed (for seasonal allergies).    [provider]  lidocaine (LIDODERM) 5 % Place 1 patch onto the skin daily. Remove & Discard patch within 12 hours or as directed by MD 11/19/16   Conrad Winter, MD  lisdexamfetamine (VYVANSE) 20 MG capsule Take 20 mg by mouth 2 (two) times daily.     [provider]  metoprolol succinate (TOPROL-XL) 25 MG 24 hr tablet Take 25 mg by mouth daily. 08/16/17   [provider]  mycophenolate (CELLCEPT) 250 MG capsule Take 250-500 mg by mouth See admin instructions. Take 500 mg by mouth in the morning and 250 mg in the evening    [provider]  naproxen (NAPROSYN) 500 MG tablet Take 1 tablet (500 mg total) by mouth 2 (two) times daily with a meal. As needed for pain 09/10/17   Dorie Rank, MD  NIFEdipine (PROCARDIA-XL/ADALAT-CC/NIFEDICAL-XL) 30 MG 24 hr tablet Take 30 mg by mouth daily.     [provider]  omeprazole (PRILOSEC) 20 MG capsule Take 20 mg by mouth daily. Reported on 06/06/2015    [provider]  pregabalin (LYRICA) 50 MG capsule Take 1 capsule (50 mg total) by mouth 3 (three) times daily. 01/08/16   Conrad Vesta, MD  Pyridoxine HCl (VITAMIN B-6 PO) Take 1 tablet by mouth 3 (three) times a week.     [provider]  rosuvastatin (CRESTOR) 5 MG tablet Take 5 mg by mouth at bedtime.     [provider]  sodium chloride (OCEAN) 0.65 % SOLN nasal spray Place 1 spray into both nostrils as needed (swelling). 10/02/16   Ashley Murrain, NP  tacrolimus (PROGRAF) 1 MG capsule Take 3 mg by mouth 2 (two) times daily.     [provider]  tadalafil (CIALIS) 20 MG tablet Take 20 mg by mouth daily as needed for erectile  dysfunction.    [provider]  tamsulosin (FLOMAX) 0.4 MG CAPS capsule Take 0.4 mg by mouth daily.    [provider]  zolpidem (AMBIEN) 10 MG tablet Take 10 mg by mouth at bedtime.     [provider]    Family History Family History  Problem Relation Age of Onset  . Hypertension Father   . Other Father        amputation  . Hypertension Mother     Social History Social  History   Tobacco Use  . Smoking status: Former Smoker    Packs/day: 0.50    Years: 34.00    Pack years: 17.00    Types: E-cigarettes    Last attempt to quit: 02/17/2003    Years since quitting: 15.4  . Smokeless tobacco: Former Systems developer  . Tobacco comment: vapor cigrarette  Substance Use Topics  . Alcohol use: No    Alcohol/week: 0.0 standard drinks  . Drug use: No    Types: Cocaine, Marijuana, Heroin    Comment: 04/25/2013 "abused in the past ; stopped  11/13/2002     Allergies   Tape   Review of Systems Review of Systems  Constitutional: Negative for activity change and appetite change.  HENT: Negative for congestion, dental problem and rhinorrhea.   Eyes: Negative for visual disturbance.  Respiratory: Positive for chest tightness and shortness of breath.   Cardiovascular: Positive for chest pain.  Gastrointestinal: Negative for abdominal pain, nausea and vomiting.  Genitourinary: Negative for dysuria and hematuria.  Musculoskeletal: Negative for arthralgias and myalgias.  Neurological: Negative for dizziness, weakness and headaches.    all other systems are negative except as noted in the HPI and PMH.    Physical Exam Updated Vital Signs BP (!) 146/84 (BP Location: Right Arm)   Pulse (!) 56   Temp 98 F (36.7 C) (Oral)   Resp 19   Ht 5\' 10"  (1.778 m)   Wt 69.4 kg   SpO2 98%   BMI 21.95 kg/m   Physical Exam Vitals signs and nursing note reviewed.  Constitutional:      General: He is not in acute distress.    Appearance: He is well-developed.  HENT:      Head: Normocephalic and atraumatic.     Mouth/Throat:     Pharynx: No oropharyngeal exudate.  Eyes:     Conjunctiva/sclera: Conjunctivae normal.     Pupils: Pupils are equal, round, and reactive to light.  Neck:     Musculoskeletal: Normal range of motion and neck supple.     Comments: No meningismus. Cardiovascular:     Rate and Rhythm: Normal rate and regular rhythm.     Heart sounds: Normal heart sounds. No murmur.  Pulmonary:     Effort: Pulmonary effort is normal. No respiratory distress.     Breath sounds: Normal breath sounds.     Comments: Reproducible left lower rib tenderness without crepitance or ecchymosis.  No rash. Chest:     Chest wall: Tenderness present.  Abdominal:     Palpations: Abdomen is soft.     Tenderness: There is no abdominal tenderness. There is no guarding or rebound.  Musculoskeletal: Normal range of motion.        General: No tenderness.  Skin:    General: Skin is warm.  Neurological:     Mental Status: He is alert and oriented to person, place, and time.     Cranial Nerves: No cranial nerve deficit.     Motor: No abnormal muscle tone.     Coordination: Coordination normal.     Comments: No ataxia on finger to nose bilaterally. No pronator drift. 5/5 strength throughout. CN 2-12 intact.Equal grip strength. Sensation intact.   Psychiatric:        Behavior: Behavior normal.      ED Treatments / Results  Labs (all labs ordered are listed, but only abnormal results are displayed) Labs Reviewed  BASIC METABOLIC PANEL - Abnormal; Notable for the following components:  Result Value   Sodium 134 (*)    Anion gap 4 (*)    All other components within normal limits  CBC WITH DIFFERENTIAL/PLATELET  TROPONIN I  TROPONIN I    EKG EKG Interpretation  Date/Time:  Tuesday August 01 2018 22:57:33 EDT Ventricular Rate:  64 PR Interval:    QRS Duration: 89 QT Interval:  415 QTC Calculation: 429 R Axis:   -49 Text Interpretation:  Sinus  rhythm Probable left atrial enlargement Left anterior fascicular block Abnormal R-wave progression, early transition T waves now upright Confirmed by Ezequiel Essex [phone]) on 08/01/2018 11:10:56 PM   Radiology Dg Ribs Unilateral W/chest Left  Result Date: 08/01/2018 CLINICAL DATA:  63 year old male with left-sided rib pain. EXAM: LEFT RIBS AND CHEST - 3+ VIEW COMPARISON:  Chest radiograph dated 07/17/2018 FINDINGS: The lungs are clear. There is no pleural effusion pneumothorax. The cardiac silhouette is within normal limits. There is atherosclerotic calcification of the aortic arch. No acute osseous pathology. No rib fracture identified. IMPRESSION: Negative. Electronically Signed   By: Anner Crete M.D.   On: 08/01/2018 23:50    Procedures Procedures (including critical care time)  Medications Ordered in ED Medications  HYDROcodone-acetaminophen (NORCO/VICODIN) 5-325 MG per tablet 1 tablet (has no administration in time range)     Initial Impression / Assessment and Plan / ED Course  I have reviewed the triage vital signs and the nursing notes.  Pertinent labs & imaging results that were available during my care of the patient were reviewed by me and considered in my medical decision making (see chart for details).       Renal transplant patient presenting with a four-day history of left rib pain worse with palpation and movement.  This appears to be musculoskeletal in origin is reproducible and worse with movement.  His EKG is unchanged LVH. We will obtain x-ray to rule out occult rib fracture.  No hypoxia or tachycardia.  Low suspicion for PE, ACS.  Negative for rib fracture or pneumothorax. EKG is unchanged.  Troponin negative x2.  Low suspicion for ACS, pulmonary embolism, aortic dissection.  Patient be treated supportively for likely musculoskeletal rib pain. No hypoxia or increased work of breathing.  Creatinine is stable.  Discussed outpatient follow-up with PCP.   Return precautions discussed.  Final Clinical Impressions(s) / ED Diagnoses   Final diagnoses:  Rib pain on left side    ED Discharge Orders    None       Trea Carnegie, Annie Main, MD 08/02/18 [phone]

## 2018-08-01 NOTE — ED Triage Notes (Signed)
Pt here for L lower chest pain since Saturday that occurs only while moving. Pain reproducible, area tender to touch. Pt thinks he "strained an artery." Pt endorses productive cough x 2 weeks with yellow phlegm.

## 2018-08-01 NOTE — ED Notes (Signed)
Patient transported to X-ray 

## 2018-08-02 LAB — CBC WITH DIFFERENTIAL/PLATELET
Abs Immature Granulocytes: 0.02 10*3/uL (ref 0.00–0.07)
Basophils Absolute: 0 10*3/uL (ref 0.0–0.1)
Basophils Relative: 1 %
EOS ABS: 0.1 10*3/uL (ref 0.0–0.5)
EOS PCT: 3 %
HCT: 42.7 % (ref 39.0–52.0)
Hemoglobin: 13.4 g/dL (ref 13.0–17.0)
Immature Granulocytes: 1 %
Lymphocytes Relative: 18 %
Lymphs Abs: 0.7 10*3/uL (ref 0.7–4.0)
MCH: 28.2 pg (ref 26.0–34.0)
MCHC: 31.4 g/dL (ref 30.0–36.0)
MCV: 89.7 fL (ref 80.0–100.0)
Monocytes Absolute: 0.6 10*3/uL (ref 0.1–1.0)
Monocytes Relative: 14 %
Neutro Abs: 2.6 10*3/uL (ref 1.7–7.7)
Neutrophils Relative %: 63 %
Platelets: 164 10*3/uL (ref 150–400)
RBC: 4.76 MIL/uL (ref 4.22–5.81)
RDW: 14 % (ref 11.5–15.5)
WBC: 4 10*3/uL (ref 4.0–10.5)
nRBC: 0 % (ref 0.0–0.2)

## 2018-08-02 LAB — BASIC METABOLIC PANEL
Anion gap: 4 — ABNORMAL LOW (ref 5–15)
BUN: 13 mg/dL (ref 8–23)
CO2: 22 mmol/L (ref 22–32)
Calcium: 9.4 mg/dL (ref 8.9–10.3)
Chloride: 108 mmol/L (ref 98–111)
Creatinine, Ser: 1.15 mg/dL (ref 0.61–1.24)
GFR calc Af Amer: >60 mL/min (ref >60)
GFR calc non Af Amer: >60 mL/min (ref >60)
Glucose, Bld: 85 mg/dL (ref 70–99)
Potassium: 3.9 mmol/L (ref 3.5–5.1)
Sodium: 134 mmol/L — ABNORMAL LOW (ref 135–145)

## 2018-08-02 LAB — TROPONIN I
Troponin I: <0.03 ng/mL (ref <0.03)
Troponin I: <0.03 ng/mL (ref <0.03)

## 2018-08-02 MED ORDER — HYDROCODONE-ACETAMINOPHEN 5-325 MG PO TABS: 1.0000 | ORAL_TABLET | ORAL | 0 refills | Status: DC | PRN

## 2018-08-02 NOTE — Discharge Instructions (Signed)
Your testing shows no evidence of a heart attack.  Take the anti-inflammatories and pain medication as needed for your rib pain.  Follow-up with your doctor.  Return to the ED if you have chest pain, shortness of breath or any other concerns.

## 2018-08-04 DIAGNOSIS — E785 Hyperlipidemia, unspecified: Secondary | ICD-10-CM | POA: Diagnosis not present

## 2018-08-04 DIAGNOSIS — G4700 Insomnia, unspecified: Secondary | ICD-10-CM | POA: Diagnosis not present

## 2018-08-04 DIAGNOSIS — Z940 Kidney transplant status: Secondary | ICD-10-CM | POA: Diagnosis not present

## 2018-08-04 DIAGNOSIS — I10 Essential (primary) hypertension: Secondary | ICD-10-CM | POA: Diagnosis not present

## 2018-08-15 ENCOUNTER — Encounter (HOSPITAL_COMMUNITY): Payer: Medicare Other

## 2018-08-15 ENCOUNTER — Ambulatory Visit: Payer: Medicare Other | Admitting: Vascular Surgery

## 2018-09-04 ENCOUNTER — Encounter (HOSPITAL_COMMUNITY): Payer: Self-pay

## 2018-09-04 ENCOUNTER — Emergency Department (HOSPITAL_COMMUNITY): Payer: Medicare Other

## 2018-09-04 ENCOUNTER — Emergency Department (HOSPITAL_COMMUNITY)
Admission: EM | Admit: 2018-09-04 | Discharge: 2018-09-04 | Disposition: A | Discharge status: Home / Self Care | Payer: Medicare Other | Attending: Emergency Medicine | Admitting: Emergency Medicine

## 2018-09-04 ENCOUNTER — Other Ambulatory Visit: Payer: Self-pay

## 2018-09-04 DIAGNOSIS — Z940 Kidney transplant status: Secondary | ICD-10-CM | POA: Insufficient documentation

## 2018-09-04 DIAGNOSIS — Y93K1 Activity, walking an animal: Secondary | ICD-10-CM | POA: Insufficient documentation

## 2018-09-04 DIAGNOSIS — Y929 Unspecified place or not applicable: Secondary | ICD-10-CM | POA: Insufficient documentation

## 2018-09-04 DIAGNOSIS — Y999 Unspecified external cause status: Secondary | ICD-10-CM | POA: Diagnosis not present

## 2018-09-04 DIAGNOSIS — Z79899 Other long term (current) drug therapy: Secondary | ICD-10-CM | POA: Insufficient documentation

## 2018-09-04 DIAGNOSIS — Z992 Dependence on renal dialysis: Secondary | ICD-10-CM | POA: Diagnosis not present

## 2018-09-04 DIAGNOSIS — I2510 Atherosclerotic heart disease of native coronary artery without angina pectoris: Secondary | ICD-10-CM | POA: Diagnosis not present

## 2018-09-04 DIAGNOSIS — S93602A Unspecified sprain of left foot, initial encounter: Secondary | ICD-10-CM | POA: Diagnosis not present

## 2018-09-04 DIAGNOSIS — X501XXA Overexertion from prolonged static or awkward postures, initial encounter: Secondary | ICD-10-CM | POA: Insufficient documentation

## 2018-09-04 DIAGNOSIS — N186 End stage renal disease: Secondary | ICD-10-CM | POA: Insufficient documentation

## 2018-09-04 DIAGNOSIS — Z7982 Long term (current) use of aspirin: Secondary | ICD-10-CM | POA: Insufficient documentation

## 2018-09-04 DIAGNOSIS — S93402A Sprain of unspecified ligament of left ankle, initial encounter: Secondary | ICD-10-CM | POA: Diagnosis not present

## 2018-09-04 DIAGNOSIS — S99922A Unspecified injury of left foot, initial encounter: Secondary | ICD-10-CM | POA: Diagnosis present

## 2018-09-04 DIAGNOSIS — Z87891 Personal history of nicotine dependence: Secondary | ICD-10-CM | POA: Insufficient documentation

## 2018-09-04 DIAGNOSIS — I120 Hypertensive chronic kidney disease with stage 5 chronic kidney disease or end stage renal disease: Secondary | ICD-10-CM | POA: Insufficient documentation

## 2018-09-04 NOTE — ED Triage Notes (Signed)
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

## 2018-09-04 NOTE — Discharge Instructions (Signed)
Please see the information and instructions below regarding your visit.  Your diagnoses today include:  1. Sprain of left foot, initial encounter    Your provider has diagnosed you as suffering from a foot sprain. Ankle sprain occurs when the ligaments that hold the ankle joint together are stretched or torn. It may take 4 to 6 weeks to heal.  Tests performed today include: An x-ray of your ankle - does NOT show any broken bones  See side panel of your discharge paperwork for testing performed today. Vital signs are listed at the bottom of these instructions.   Medications prescribed:  Take any prescribed medications only as prescribed, and any over the counter medications only as directed on the packaging.  I recommend Tylenol, Sater 50 mg every 6 hours as needed for pain.  Home care instructions:  Follow R.I.C.E. Protocol: R - rest your injury  I  - use ice on injury without applying directly to skin C - compress injury with bandage or splint E - elevate the injury as much as possible  For Activity: Wear ACE wrap for at least 2 weeks for stabilization of foot. If prescribed crutches, use crutches with non-weight bearing for the first few days. Then, you may walk on your foot as the pain allows, or as instructed. Start gradually with weight bearing on the affected foot. Once you can walk pain free, then try jogging. When you can run forwards, then you can try moving side-to-side. If you cannot walk without crutches in one week, you need a re-check.  Please follow any educational materials contained in this packet.   Follow-up instructions: Please follow-up with your primary care provider or the provided orthopedic (bone specialist) listed in this packet if you continue to have significant pain or trouble walking in 1 week. In this case you may have a severe sprain that requires further care.   Return instructions:  Please return if your toes are numb or tingling, appear gray or blue,  are much colder than your other foot, or you have severe pain (also elevate leg and loosen splint or wrap). Please return to the Emergency Department if you experience worsening symptoms.  Please return if you have any other emergent concerns.  Additional Information:   Your vital signs today were: BP 132/78 (BP Location: Right Arm)    Pulse 83    Temp 98.2 F (36.8 C) (Oral)    Resp 14    Ht 5\' 10"  (1.778 m)    Wt 68.9 kg    SpO2 98%    BMI 21.81 kg/m  If your blood pressure (BP) was elevated on multiple readings during this visit above 130 for the top number or above 80 for the bottom number, please have this repeated by your primary care provider within one month. --------------  Thank you for allowing [place] to participate in your care today.

## 2018-09-04 NOTE — ED Provider Notes (Signed)
[place] DEPT Provider Note   CSN: [id number] Arrival date & time: 09/04/18  1848    History   Chief Complaint Chief Complaint  Patient presents with  . Foot Pain    HPI Scott Chavez is a 63 y.o. male.     HPI  Patient is a 63 year old male with a past medical history of renal transplantation, stable with normal renal function, hepatitis C, hypertension, hypercholesterolemia presenting for left foot and ankle injury.  Patient reports that 48 hours ago when he was letting his dog off the porch, the dog pulled the leash, and patient felt his left foot inverted.  He reports that he is having pain around the base of the fifth metatarsal.  He denies any significant ecchymosis, loss of sensation or discoloration.  Patient reports he has been applying Biofreeze without relief.  Patient reports that he has been able to walk on it, however he works in a job that requires standing most of the day and it has been very painful.  Past Medical History:  Diagnosis Date  . ADHD   . Anemia    pt. not sure, but reports he is taking Fe for one month now.  . Aorto-iliac disease ([place])   . Bladder dysfunction   . ESRD (end stage renal disease) [facility])    [facility]- [facility] , followed by Dr. Moshe Cipro   . GERD (gastroesophageal reflux disease)   . Hemodialysis patient [facility])    "on dialysis 1998-2010" 04/25/2013)  . Hepatitis C    has been treated with Harvoni  . History of renal failure   . Hypercholesteremia   . Hypertension   . Internal hemorrhoids    Colonoscopy 2010 and anoscopy 2015  . Peripheral vascular disease ([place])   . Pneumonia   . Self-catheterizes urinary bladder    pt. choice, but he reports that he desires to do this to avoid retention     Patient Active Problem List   Diagnosis Date Noted  . Iron deficiency anemia 04/06/2017  . Peripheral arterial disease ([place]) 10/25/2016  . Chronic neck pain 03/15/2016  . Chronic low  back pain without sciatica 03/15/2016  . Nonallopathic lesion of lumbosacral region 03/15/2016  . Nonallopathic lesion of sacral region 03/15/2016  . Nonallopathic lesion of thoracic region 03/15/2016  . Liver fibrosis 12/18/2015  . Arteriovenous fistula thrombosis ([place]) 10/16/2015  . Chronic hepatitis C without hepatic coma ([place]) 09/03/2015  . Atherosclerosis of native arteries of extremities with intermittent claudication, right leg ([place]) 04/17/2015  . Pain in joint, lower leg 09/13/2014  . Hemorrhoids, internal, with bleeding 01/29/2014  . Pain in limb 08/17/2013  . Peripheral vascular disease, unspecified ([place]) 06/08/2013  . Redness of skin-Calf to foot LEFT 05/25/2013  . Tenderness in limb-Calf to foot LEFT 05/25/2013  . Swelling of limb-Calf to foot LEFT 05/25/2013  . Postoperative pain 05/02/2013  . PAD (peripheral artery disease) ([place]) 04/24/2013  . Preop cardiovascular exam 03/27/2013  . Essential hypertension 03/27/2013  . Hyperlipidemia 03/27/2013  . Atherosclerosis of native arteries of extremity with intermittent claudication ([place]) 02/16/2013  . History of renal transplant 02/16/2013    Past Surgical History:  Procedure Laterality Date  . ABDOMINAL AORTAGRAM N/A 03/09/2013   Procedure: ABDOMINAL Maxcine Ham;  Surgeon: Elam Dutch, MD;  Location: [facility] CATH LAB;  Service: Cardiovascular;  Laterality: N/A;  . AV FISTULA PLACEMENT Left 1998   "removed 2011" (04/25/2013)  . AV FISTULA REPAIR Left    removal with partial  vein removed  . COLONOSCOPY    . ENDARTERECTOMY FEMORAL Right 04/17/2015   Procedure: ENDARTERECTOMY RIGHT ILIOFEMORAL WITH PROFUNDOPLASTY;  Surgeon: Conrad Farwell, MD;  Location: [place];  Service: Vascular;  Laterality: Right;  . ENDARTERECTOMY FEMORAL Right 10/25/2016   Procedure: RIGHT ILIOFEMORAL ENDARTERECTOMY WITH BOVINE PERICARDIAL PATCH ANGIOPLASTY;  Surgeon: Conrad Thiensville, MD;  Location: [place];  Service: Vascular;  Laterality: Right;  .  FEMORAL-POPLITEAL BYPASS GRAFT Left 04/24/2013   Procedure: BYPASS GRAFT COMMON FEMORALARTERY-BELOW KNEE POPLITEAL ARTERY WITH GREATER SAPHENOUS VEIN;  Surgeon: Conrad Sharpsville, MD;  Location: [place];  Service: Vascular;  Laterality: Left;  . FEMORAL-POPLITEAL BYPASS GRAFT Right 04/17/2015   Procedure: BYPASS GRAFT RIGHT FEMORALTO BELOW KNEE POPLITEAL ARTERY USING RIGHT NON REVERSED GREATER SAPPHENOUS VEIN;  Surgeon: Conrad [place] Centro, MD;  Location: [place];  Service: Vascular;  Laterality: Right;  . INTRAOPERATIVE ARTERIOGRAM Left 04/24/2013   Procedure: INTRA OPERATIVE ARTERIOGRAM;  Surgeon: Conrad Penelope, MD;  Location: [place];  Service: Vascular;  Laterality: Left;  . KIDNEY TRANSPLANT    . LOWER EXTREMITY ANGIOGRAM Bilateral 03/09/2013   Procedure: LOWER EXTREMITY ANGIOGRAM;  Surgeon: Elam Dutch, MD;  Location: [facility] CATH LAB;  Service: Cardiovascular;  Laterality: Bilateral;  . LOWER EXTREMITY ANGIOGRAM Right 10/25/2013   Procedure: LOWER EXTREMITY ANGIOGRAM;  Surgeon: Conrad Lebanon, MD;  Location: [facility] CATH LAB;  Service: Cardiovascular;  Laterality: Right;  . LOWER EXTREMITY ANGIOGRAM Right 12/13/2013   Procedure: LOWER EXTREMITY ANGIOGRAM;  Surgeon: Conrad Escalante, MD;  Location: [facility] CATH LAB;  Service: Cardiovascular;  Laterality: Right;  . NEPHRECTOMY RECIPIENT  2010  . NEPHRECTOMY TRANSPLANTED ORGAN    . PATCH ANGIOPLASTY Right 04/17/2015   Procedure: Rueben Bash PATCH ANGIOPLASTY, RIGHT ILEOFEMORAL;  Surgeon: Conrad Verlot, MD;  Location: [place];  Service: Vascular;  Laterality: Right;  . PERIPHERAL VASCULAR CATHETERIZATION N/A 04/10/2015   Procedure: Abdominal Aortogram;  Surgeon: Conrad Weldona, MD;  Location: [place] CV LAB;  Service: Cardiovascular;  Laterality: N/A;  . PERIPHERAL VASCULAR CATHETERIZATION N/A 05/10/2016   Procedure: Abdominal Aortogram w/Lower Extremity;  Surgeon: Conrad Guadalupe, MD;  Location: [place] CV LAB;  Service: Cardiovascular;  Laterality: N/A;  . s/p cadaveric renal  transplant  December 2010   [place] Left 09/24/2015   Procedure: EXCISION OF THROMBOSED RADIOCEPHALIC ARTERIOVENOUS FISTULA;  Surgeon: Conrad , MD;  Location: [place];  Service: Vascular;  Laterality: Left;  Marland Kitchen VEIN HARVEST Right 04/17/2015   Procedure: RIGHT GREATER SAPPHENOUS VEIN HARVEST ;  Surgeon: Conrad , MD;  Location: [place];  Service: Vascular;  Laterality: Right;        Home Medications    Prior to Admission medications   Medication Sig Start Date End Date Taking? Authorizing Provider  albuterol (PROVENTIL HFA;VENTOLIN HFA) 108 (90 Base) MCG/ACT inhaler Inhale 1-2 puffs into the lungs every 6 (six) hours as needed for wheezing or shortness of breath. 07/17/18   Rancour, Annie Main, MD  aspirin EC 81 MG tablet Take 81-162 mg by mouth daily.    [provider]  citalopram (CELEXA) 40 MG tablet Take 20 mg by mouth 2 (two) times daily.  02/18/17   [provider]  Cyanocobalamin (VITAMIN B-12 PO) Take 1 tablet by mouth daily.    [provider]  diphenhydramine-acetaminophen (TYLENOL PM) 25-500 MG TABS tablet Take 1 tablet by mouth at bedtime as needed (for pain or sleep).     [provider]  doxycycline (VIBRAMYCIN) 100 MG capsule Take 1 capsule (100 mg total) by mouth 2 (two) times daily. Patient not taking: Reported on 08/01/2018 07/17/18   Ezequiel Essex, MD  ferrous sulfate 325 (65 FE) MG EC tablet Take 1 tablet (325 mg total) by mouth 2 (two) times daily. Patient not taking: Reported on 08/01/2018 06/10/17   Ardath Sax, MD  HYDROcodone-acetaminophen (NORCO/VICODIN) 5-325 MG tablet Take 1 tablet by mouth every 4 (four) hours as needed. 08/02/18   Rancour, Annie Main, MD  ketotifen (ZADITOR) 0.025 % ophthalmic solution Place 1 drop into both eyes 2 (two) times daily as needed (for seasonal allergies).    [provider]  lidocaine (LIDODERM) 5 % Place 1 patch onto the skin daily. Remove & Discard  patch within 12 hours or as directed by MD Patient taking differently: Place 1 patch onto the skin daily as needed (pain). Remove & Discard patch within 12 hours or as directed by MD 11/19/16   Conrad [place], MD  lisdexamfetamine (VYVANSE) 20 MG capsule Take 20 mg by mouth 2 (two) times daily.     [provider]  metoprolol succinate (TOPROL-XL) 25 MG 24 hr tablet Take 25 mg by mouth 2 (two) times daily.  08/16/17   [provider]  mycophenolate (CELLCEPT) 250 MG capsule Take 250-500 mg by mouth See admin instructions. Take 500 mg by mouth in the morning and 250 mg in the evening    [provider]  naproxen (NAPROSYN) 500 MG tablet Take 1 tablet (500 mg total) by mouth 2 (two) times daily with a meal. As needed for pain Patient taking differently: Take 500 mg by mouth 2 (two) times daily as needed for mild pain.  09/10/17   Dorie Rank, MD  NIFEdipine (PROCARDIA-XL/ADALAT-CC/NIFEDICAL-XL) 30 MG 24 hr tablet Take 30 mg by mouth daily.     [provider]  omeprazole (PRILOSEC) 20 MG capsule Take 20 mg by mouth daily. Reported on 06/06/2015    [provider]  pregabalin (LYRICA) 50 MG capsule Take 1 capsule (50 mg total) by mouth 3 (three) times daily. 01/08/16   Conrad Langdon Place, MD  Pyridoxine HCl (VITAMIN B-6 PO) Take 1 tablet by mouth 3 (three) times a week.     [provider]  rosuvastatin (CRESTOR) 5 MG tablet Take 5 mg by mouth at bedtime.     [provider]  sodium chloride (OCEAN) 0.65 % SOLN nasal spray Place 1 spray into both nostrils as needed (swelling). 10/02/16   Ashley Murrain, NP  tacrolimus (PROGRAF) 1 MG capsule Take 3-4 mg by mouth 2 (two) times daily. 4 mg in the morning and 3 mg in the evening    [provider]  tadalafil (CIALIS) 20 MG tablet Take 20 mg by mouth daily as needed for erectile dysfunction.    [provider]  tamsulosin (FLOMAX) 0.4 MG CAPS capsule Take 0.4 mg by mouth daily.    [provider]  zolpidem (AMBIEN) 10 MG tablet Take 5 mg by mouth at bedtime.     [provider]    Family History Family History  Problem Relation Age of Onset  . Hypertension Father   . Other Father        amputation  . Hypertension Mother     Social History Social History   Tobacco Use  . Smoking status: Former Smoker    Packs/day: 0.50    Years: 34.00    Pack years:  17.00    Types: E-cigarettes    Last attempt to quit: 02/17/2003    Years since quitting: 15.5  . Smokeless tobacco: Former Systems developer  . Tobacco comment: vapor cigrarette  Substance Use Topics  . Alcohol use: No    Alcohol/week: 0.0 standard drinks  . Drug use: No    Types: Cocaine, Marijuana, Heroin    Comment: 04/25/2013 "abused in the past ; stopped  11/13/2002     Allergies   Tape   Review of Systems Review of Systems  Musculoskeletal: Positive for arthralgias.  Skin: Negative for color change and wound.  Neurological: Negative for weakness and numbness.     Physical Exam Updated Vital Signs BP 132/78 (BP Location: Right Arm)   Pulse 83   Temp 98.2 F (36.8 C) (Oral)   Resp 14   Ht 5\' 10"  (1.778 m)   Wt 68.9 kg   SpO2 98%   BMI 21.81 kg/m   Physical Exam Vitals signs and nursing note reviewed.  Constitutional:      General: He is not in acute distress.    Appearance: He is well-developed. He is not diaphoretic.     Comments: Sitting comfortably in bed.  HENT:     Head: Normocephalic and atraumatic.  Eyes:     General:        Right eye: No discharge.        Left eye: No discharge.     Conjunctiva/sclera: Conjunctivae normal.     Comments: EOMs normal to gross examination.  Neck:     Musculoskeletal: Normal range of motion.  Cardiovascular:     Rate and Rhythm: Normal rate and regular rhythm.     Comments: Intact, 2+ DP pulses bilaterally. Pulmonary:     Comments: Patient converses comfortably without audible wheeze or stridor. Abdominal:     General: There is no  distension.  Musculoskeletal: Normal range of motion.        General: Tenderness present.     Comments: Patient has tenderness overlying the dorsum of the left foot overlying the base of the fourth metatarsal.  No tenderness to palpation over the navicular or bilateral malleoli.  No tenderness to palpation of the proximal fibula.  All compartments of the left calf are soft.  Patient is able to flex, extend, invert and evert the left foot without difficulty.  Skin:    General: Skin is warm and dry.     Comments: Skin of the left lower extremity normal in coloration.  Neurological:     Mental Status: He is alert.     Comments: Cranial nerves intact to gross observation. Patient moves extremities without difficulty.  Psychiatric:        Behavior: Behavior normal.        Thought Content: Thought content normal.        Judgment: Judgment normal.      ED Treatments / Results  Labs (all labs ordered are listed, but only abnormal results are displayed) Labs Reviewed - No data to display  EKG None  Radiology Dg Ankle Complete Left  Result Date: 09/04/2018 CLINICAL DATA:  Dog pulled him of steps and twisted LEFT ankle and foot 2 days ago, persistent pain, worsened pain after he works all day, swelling EXAM: LEFT ANKLE COMPLETE - 3+ VIEW COMPARISON:  12/14/2010 FINDINGS: Osseous demineralization. Joint spaces preserved. No acute fracture, dislocation, or bone destruction. IMPRESSION: No acute osseous abnormalities. Electronically Signed   By: Lavonia Dana M.D.   On: 09/04/2018  19:34   Dg Foot Complete Left  Result Date: 09/04/2018 CLINICAL DATA:  Dog pulled him of steps and twisted LEFT ankle and foot 2 days ago, persistent pain, worsened pain after he works all day, swelling EXAM: LEFT FOOT - COMPLETE 3+ VIEW COMPARISON:  10/02/2016 FINDINGS: Osseous demineralization. Degenerative changes of the first MTP joint with hallux valgus and bunion deformity. Remaining joint spaces preserved. No acute  fracture, dislocation, or bone destruction. IMPRESSION: Hallux valgus and bunion deformity with degenerative changes of first MTP joint. Osseous demineralization without acute bony abnormalities. Electronically Signed   By: Lavonia Dana M.D.   On: 09/04/2018 19:33    Procedures Procedures (including critical care time)  Medications Ordered in ED Medications - No data to display   Initial Impression / Assessment and Plan / ED Course  I have reviewed the triage vital signs and the nursing notes.  Pertinent labs & imaging results that were available during my care of the patient were reviewed by me and considered in my medical decision making (see chart for details).        Patient is well-appearing and neurovascularly intact in the left lower extremity.  Patient presenting with injury 2 days ago with an inversion injury.  There is no other injury that occurred in the event.  No syncope.  X-ray is without any evidence of fracture in the areas in question of the foot or ankle.  Radiographs reviewed by me.  Will place patient in an Ace wrap and give crutches for weightbearing as tolerated.  Orthopedic follow-up provided in case this is not improving.  Return precautions given for any increasing pain, pallor or paresthesias or discoloration of the left lower extremity.  Patient is in understanding and agrees with the plan of care.  Final Clinical Impressions(s) / ED Diagnoses   Final diagnoses:  Sprain of left foot, initial encounter    ED Discharge Orders    None       Tamala Julian 09/04/18 2102    Margette Fast, MD 09/04/18 2203

## 2018-09-21 ENCOUNTER — Other Ambulatory Visit: Payer: Self-pay

## 2018-09-21 ENCOUNTER — Emergency Department (HOSPITAL_COMMUNITY): Payer: Medicare Other

## 2018-09-21 ENCOUNTER — Emergency Department (HOSPITAL_COMMUNITY)
Admission: EM | Admit: 2018-09-21 | Discharge: 2018-09-21 | Disposition: A | Discharge status: Home / Self Care | Payer: Medicare Other | Attending: Emergency Medicine | Admitting: Emergency Medicine

## 2018-09-21 ENCOUNTER — Encounter (HOSPITAL_COMMUNITY): Payer: Self-pay | Admitting: Emergency Medicine

## 2018-09-21 DIAGNOSIS — S161XXA Strain of muscle, fascia and tendon at neck level, initial encounter: Secondary | ICD-10-CM | POA: Insufficient documentation

## 2018-09-21 DIAGNOSIS — R51 Headache: Secondary | ICD-10-CM | POA: Diagnosis not present

## 2018-09-21 DIAGNOSIS — Y9389 Activity, other specified: Secondary | ICD-10-CM | POA: Insufficient documentation

## 2018-09-21 DIAGNOSIS — I120 Hypertensive chronic kidney disease with stage 5 chronic kidney disease or end stage renal disease: Secondary | ICD-10-CM | POA: Diagnosis not present

## 2018-09-21 DIAGNOSIS — Z7982 Long term (current) use of aspirin: Secondary | ICD-10-CM | POA: Diagnosis not present

## 2018-09-21 DIAGNOSIS — Y92410 Unspecified street and highway as the place of occurrence of the external cause: Secondary | ICD-10-CM | POA: Diagnosis not present

## 2018-09-21 DIAGNOSIS — M542 Cervicalgia: Secondary | ICD-10-CM | POA: Diagnosis not present

## 2018-09-21 DIAGNOSIS — Z79899 Other long term (current) drug therapy: Secondary | ICD-10-CM | POA: Diagnosis not present

## 2018-09-21 DIAGNOSIS — N186 End stage renal disease: Secondary | ICD-10-CM | POA: Diagnosis not present

## 2018-09-21 DIAGNOSIS — S0990XA Unspecified injury of head, initial encounter: Secondary | ICD-10-CM | POA: Diagnosis not present

## 2018-09-21 DIAGNOSIS — S3992XA Unspecified injury of lower back, initial encounter: Secondary | ICD-10-CM | POA: Diagnosis not present

## 2018-09-21 DIAGNOSIS — Z992 Dependence on renal dialysis: Secondary | ICD-10-CM | POA: Insufficient documentation

## 2018-09-21 DIAGNOSIS — Y998 Other external cause status: Secondary | ICD-10-CM | POA: Diagnosis not present

## 2018-09-21 DIAGNOSIS — S299XXA Unspecified injury of thorax, initial encounter: Secondary | ICD-10-CM | POA: Diagnosis not present

## 2018-09-21 DIAGNOSIS — S199XXA Unspecified injury of neck, initial encounter: Secondary | ICD-10-CM | POA: Diagnosis not present

## 2018-09-21 DIAGNOSIS — Z87891 Personal history of nicotine dependence: Secondary | ICD-10-CM | POA: Diagnosis not present

## 2018-09-21 DIAGNOSIS — S39012A Strain of muscle, fascia and tendon of lower back, initial encounter: Secondary | ICD-10-CM | POA: Diagnosis not present

## 2018-09-21 DIAGNOSIS — Z940 Kidney transplant status: Secondary | ICD-10-CM | POA: Insufficient documentation

## 2018-09-21 MED ORDER — CYCLOBENZAPRINE HCL 10 MG PO TABS: 10.0000 mg | ORAL_TABLET | Freq: Two times a day (BID) | ORAL | 0 refills | Status: DC | PRN

## 2018-09-21 MED ORDER — ACETAMINOPHEN 500 MG PO TABS
1000.0000 mg | ORAL_TABLET | Freq: Once | ORAL | Status: AC
  Administered 2018-09-21: 22:00:00 1000 mg via ORAL
  Filled 2018-09-21: qty 2

## 2018-09-21 NOTE — ED Notes (Signed)
Pt given and verbalized understanding of d/c instructions and need for follow up with pcp. Told to return if s/s worsen. No further distress or questions upon ambulation out of department.
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
## 2018-09-27 DIAGNOSIS — Z4822 Encounter for aftercare following kidney transplant: Secondary | ICD-10-CM | POA: Diagnosis not present

## 2018-09-27 DIAGNOSIS — N319 Neuromuscular dysfunction of bladder, unspecified: Secondary | ICD-10-CM | POA: Diagnosis not present

## 2018-09-27 DIAGNOSIS — E878 Other disorders of electrolyte and fluid balance, not elsewhere classified: Secondary | ICD-10-CM | POA: Diagnosis not present

## 2018-09-27 DIAGNOSIS — Z87891 Personal history of nicotine dependence: Secondary | ICD-10-CM | POA: Diagnosis not present

## 2018-09-27 DIAGNOSIS — Z5181 Encounter for therapeutic drug level monitoring: Secondary | ICD-10-CM | POA: Diagnosis not present

## 2018-09-27 DIAGNOSIS — I10 Essential (primary) hypertension: Secondary | ICD-10-CM | POA: Diagnosis not present

## 2018-09-27 DIAGNOSIS — E785 Hyperlipidemia, unspecified: Secondary | ICD-10-CM | POA: Diagnosis not present

## 2018-09-27 DIAGNOSIS — Z7952 Long term (current) use of systemic steroids: Secondary | ICD-10-CM | POA: Diagnosis not present

## 2018-09-27 DIAGNOSIS — Z7982 Long term (current) use of aspirin: Secondary | ICD-10-CM | POA: Diagnosis not present

## 2018-09-27 DIAGNOSIS — I739 Peripheral vascular disease, unspecified: Secondary | ICD-10-CM | POA: Diagnosis not present

## 2018-09-27 DIAGNOSIS — Z792 Long term (current) use of antibiotics: Secondary | ICD-10-CM | POA: Diagnosis not present

## 2018-09-27 DIAGNOSIS — Z940 Kidney transplant status: Secondary | ICD-10-CM | POA: Diagnosis not present

## 2018-09-27 DIAGNOSIS — D899 Disorder involving the immune mechanism, unspecified: Secondary | ICD-10-CM | POA: Diagnosis not present

## 2018-09-27 DIAGNOSIS — Z79899 Other long term (current) drug therapy: Secondary | ICD-10-CM | POA: Diagnosis not present

## 2018-09-27 DIAGNOSIS — Z131 Encounter for screening for diabetes mellitus: Secondary | ICD-10-CM | POA: Diagnosis not present

## 2018-10-20 DIAGNOSIS — Z131 Encounter for screening for diabetes mellitus: Secondary | ICD-10-CM | POA: Diagnosis not present

## 2018-10-20 DIAGNOSIS — Z1389 Encounter for screening for other disorder: Secondary | ICD-10-CM | POA: Diagnosis not present

## 2018-10-20 DIAGNOSIS — I10 Essential (primary) hypertension: Secondary | ICD-10-CM | POA: Diagnosis not present

## 2018-10-20 DIAGNOSIS — Z0000 Encounter for general adult medical examination without abnormal findings: Secondary | ICD-10-CM | POA: Diagnosis not present

## 2018-11-06 DIAGNOSIS — Z940 Kidney transplant status: Secondary | ICD-10-CM | POA: Diagnosis not present

## 2018-11-20 ENCOUNTER — Other Ambulatory Visit: Payer: Self-pay

## 2018-11-20 ENCOUNTER — Ambulatory Visit (INDEPENDENT_AMBULATORY_CARE_PROVIDER_SITE_OTHER)
Admission: RE | Admit: 2018-11-20 | Discharge: 2018-11-20 | Disposition: A | Discharge status: Home / Self Care | Payer: Medicare Other | Source: Ambulatory Visit | Attending: Vascular Surgery | Admitting: Vascular Surgery

## 2018-11-20 ENCOUNTER — Ambulatory Visit (HOSPITAL_COMMUNITY)
Admission: RE | Admit: 2018-11-20 | Discharge: 2018-11-20 | Disposition: A | Discharge status: Home / Self Care | Payer: Medicare Other | Source: Ambulatory Visit | Attending: Vascular Surgery | Admitting: Vascular Surgery

## 2018-11-20 ENCOUNTER — Other Ambulatory Visit: Payer: Self-pay | Admitting: Vascular Surgery

## 2018-11-20 DIAGNOSIS — I70211 Atherosclerosis of native arteries of extremities with intermittent claudication, right leg: Secondary | ICD-10-CM

## 2018-11-20 DIAGNOSIS — I739 Peripheral vascular disease, unspecified: Secondary | ICD-10-CM | POA: Insufficient documentation

## 2018-11-20 DIAGNOSIS — I70213 Atherosclerosis of native arteries of extremities with intermittent claudication, bilateral legs: Secondary | ICD-10-CM

## 2018-11-21 ENCOUNTER — Encounter: Payer: Self-pay | Admitting: Vascular Surgery

## 2018-11-21 ENCOUNTER — Other Ambulatory Visit: Payer: Self-pay

## 2018-11-21 ENCOUNTER — Encounter (HOSPITAL_COMMUNITY): Payer: Medicare Other

## 2018-11-21 ENCOUNTER — Ambulatory Visit (INDEPENDENT_AMBULATORY_CARE_PROVIDER_SITE_OTHER): Payer: Medicare Other | Admitting: Vascular Surgery

## 2018-11-21 VITALS — Ht 70.0 in | Wt 154.0 lb

## 2018-11-21 DIAGNOSIS — I739 Peripheral vascular disease, unspecified: Secondary | ICD-10-CM | POA: Diagnosis not present

## 2018-11-21 NOTE — Progress Notes (Signed)
Virtual Visit via Telephone Note   I connected with Scott Chavez on 11/21/2018 using the [employer] by telephone and verified that I was speaking with the correct person using two identifiers. Patient was located at home.  I am located at [facility] office on [address].   The limitations of evaluation and management by telemedicine and the availability of in person appointments have been previously discussed with the patient and are documented in the patients chart. The patient expressed understanding and consented to proceed.  PCP: Benito Mccreedy, MD   Chief Complaint: 64-month follow-up for ongoing surveillance bilateral lower extremity bypasses  History of Present Illness: Scott Chavez is a 63 y.o. male that I had a telephone encounter with four 79-month surveillance of his lower extremity bypasses.  He was previously under the care of Dr. Bridgett Larsson and had bilateral common iliac stents in 2015.  He also has a left common femoral to below-knee popliteal bypass with non-reversed ipsilateral vein done on 04/24/2013.  And on the right he had an iliofemoral endarterectomy with a right common femoral to below-knee pop bypass with non-reversed ipsilateral saphenous vein in 2016.  There has been a stenosis in his right common iliac stent this been followed.  On the telephone encounter today patient reports no new symptoms in his legs.  States is able to mow his yard and walk long distances without any significant discomfort.  His main concern is he says he has hernia in his left groin and it bothers him during sexual activity.  Otherwise no new tissue loss or rest pain at night.  States he does vape.  Past Medical History:  Diagnosis Date  . ADHD   . Anemia    pt. not sure, but reports he is taking Fe for one month now.  . Aorto-iliac disease ([place])   . Bladder dysfunction   . ESRD (end stage renal disease) [facility])    trans[facility]- [facility] , followed by Dr. Moshe Cipro   . GERD  (gastroesophageal reflux disease)   . Hemodialysis patient [facility])    "on dialysis 1998-2010" 04/25/2013)  . Hepatitis C    has been treated with Harvoni  . History of renal failure   . Hypercholesteremia   . Hypertension   . Internal hemorrhoids    Colonoscopy 2010 and anoscopy 2015  . Peripheral vascular disease ([place])   . Pneumonia   . Self-catheterizes urinary bladder    pt. choice, but he reports that he desires to do this to avoid retention     Past Surgical History:  Procedure Laterality Date  . ABDOMINAL AORTAGRAM N/A 03/09/2013   Procedure: ABDOMINAL Maxcine Ham;  Surgeon: Elam Dutch, MD;  Location: [facility] CATH LAB;  Service: Cardiovascular;  Laterality: N/A;  . AV FISTULA PLACEMENT Left 1998   "removed 2011" (04/25/2013)  . AV FISTULA REPAIR Left    removal with partial vein removed  . COLONOSCOPY    . ENDARTERECTOMY FEMORAL Right 04/17/2015   Procedure: ENDARTERECTOMY RIGHT ILIOFEMORAL WITH PROFUNDOPLASTY;  Surgeon: Conrad Tecumseh, MD;  Location: [place];  Service: Vascular;  Laterality: Right;  . ENDARTERECTOMY FEMORAL Right 10/25/2016   Procedure: RIGHT ILIOFEMORAL ENDARTERECTOMY WITH BOVINE PERICARDIAL PATCH ANGIOPLASTY;  Surgeon: Conrad Bunnell, MD;  Location: [place];  Service: Vascular;  Laterality: Right;  . FEMORAL-POPLITEAL BYPASS GRAFT Left 04/24/2013   Procedure: BYPASS GRAFT COMMON FEMORALARTERY-BELOW KNEE POPLITEAL ARTERY WITH GREATER SAPHENOUS VEIN;  Surgeon: Conrad Rollingstone, MD;  Location: [place];  Service:  Vascular;  Laterality: Left;  . FEMORAL-POPLITEAL BYPASS GRAFT Right 04/17/2015   Procedure: BYPASS GRAFT RIGHT FEMORALTO BELOW KNEE POPLITEAL ARTERY USING RIGHT NON REVERSED GREATER SAPPHENOUS VEIN;  Surgeon: Conrad Wilmington, MD;  Location: [place];  Service: Vascular;  Laterality: Right;  . INTRAOPERATIVE ARTERIOGRAM Left 04/24/2013   Procedure: INTRA OPERATIVE ARTERIOGRAM;  Surgeon: Conrad Ward, MD;  Location[place];  Service: Vascular;  Laterality: Left;  . KIDNEY  TRANSPLANT    . LOWER EXTREMITY ANGIOGRAM Bilateral 03/09/2013   Procedure: LOWER EXTREMITY ANGIOGRAM;  Surgeon: Elam Dutch, MD;  Location: [facility] CATH LAB;  Service: Cardiovascular;  Laterality: Bilateral;  . LOWER EXTREMITY ANGIOGRAM Right 10/25/2013   Procedure: LOWER EXTREMITY ANGIOGRAM;  Surgeon: Conrad Triana, MD;  Location: [facility] CATH LAB;  Service: Cardiovascular;  Laterality: Right;  . LOWER EXTREMITY ANGIOGRAM Right 12/13/2013   Procedure: LOWER EXTREMITY ANGIOGRAM;  Surgeon: Conrad Inniswold, MD;  Location: [facility] CATH LAB;  Service: Cardiovascular;  Laterality: Right;  . NEPHRECTOMY RECIPIENT  2010  . NEPHRECTOMY TRANSPLANTED ORGAN    . PATCH ANGIOPLASTY Right 04/17/2015   Procedure: Rueben Bash PATCH ANGIOPLASTY, RIGHT ILEOFEMORAL;  Surgeon: Conrad Gholson, MD;  Location: [place];  Service: Vascular;  Laterality: Right;  . PERIPHERAL VASCULAR CATHETERIZATION N/A 04/10/2015   Procedure: Abdominal Aortogram;  Surgeon: Conrad [place], MD;  Location: [place] CV LAB;  Service: Cardiovascular;  Laterality: N/A;  . PERIPHERAL VASCULAR CATHETERIZATION N/A 05/10/2016   Procedure: Abdominal Aortogram w/Lower Extremity;  Surgeon: Conrad Talihina, MD;  Location: [place] CV LAB;  Service: Cardiovascular;  Laterality: N/A;  . s/p cadaveric renal transplant  December 2010   [place] Left 09/24/2015   Procedure: EXCISION OF THROMBOSED RADIOCEPHALIC ARTERIOVENOUS FISTULA;  Surgeon: Conrad Stockbridge, MD;  Location: [place];  Service: Vascular;  Laterality: Left;  Marland Kitchen VEIN HARVEST Right 04/17/2015   Procedure: RIGHT GREATER SAPPHENOUS VEIN HARVEST ;  Surgeon: Conrad , MD;  Location[facility] OR;  Service: Vascular;  Laterality: Right;    Current Meds  Medication Sig  . albuterol (PROVENTIL HFA;VENTOLIN HFA) 108 (90 Base) MCG/ACT inhaler Inhale 1-2 puffs into the lungs every 6 (six) hours as needed for wheezing or shortness of breath.  Marland Kitchen aspirin EC 81 MG tablet Take 81-162 mg by mouth daily.   . citalopram (CELEXA) 40 MG tablet Take 20 mg by mouth 2 (two) times daily.   . Cyanocobalamin (VITAMIN B-12 PO) Take 1 tablet by mouth daily.  . cyclobenzaprine (FLEXERIL) 10 MG tablet Take 1 tablet (10 mg total) by mouth 2 (two) times daily as needed for muscle spasms.  . diphenhydramine-acetaminophen (TYLENOL PM) 25-500 MG TABS tablet Take 1 tablet by mouth at bedtime as needed (for pain or sleep).   . ferrous sulfate 325 (65 FE) MG EC tablet Take 1 tablet (325 mg total) by mouth 2 (two) times daily.  Marland Kitchen ketotifen (ZADITOR) 0.025 % ophthalmic solution Place 1 drop into both eyes 2 (two) times daily as needed (for seasonal allergies).  Marland Kitchen lidocaine (LIDODERM) 5 % Place 1 patch onto the skin daily. Remove & Discard patch within 12 hours or as directed by MD (Patient taking differently: Place 1 patch onto the skin daily as needed (pain). Remove & Discard patch within 12 hours or as directed by MD)  . lisdexamfetamine (VYVANSE) 20 MG capsule Take 20 mg by mouth 2 (two) times daily.   . metoprolol succinate (TOPROL-XL) 25 MG 24 hr tablet  Take 25 mg by mouth 2 (two) times daily.   . mycophenolate (CELLCEPT) 250 MG capsule Take 250-500 mg by mouth See admin instructions. Take 500 mg by mouth in the morning and 250 mg in the evening  . naproxen (NAPROSYN) 500 MG tablet Take 1 tablet (500 mg total) by mouth 2 (two) times daily with a meal. As needed for pain (Patient taking differently: Take 500 mg by mouth 2 (two) times daily as needed for mild pain. )  . NIFEdipine (PROCARDIA-XL/ADALAT-CC/NIFEDICAL-XL) 30 MG 24 hr tablet Take 30 mg by mouth daily.   Marland Kitchen omeprazole (PRILOSEC) 20 MG capsule Take 20 mg by mouth daily. Reported on 06/06/2015  . pregabalin (LYRICA) 50 MG capsule Take 1 capsule (50 mg total) by mouth 3 (three) times daily.  . Pyridoxine HCl (VITAMIN B-6 PO) Take 1 tablet by mouth 3 (three) times a week.   . rosuvastatin (CRESTOR) 5 MG tablet Take 5 mg by mouth at bedtime.   . sodium chloride  (OCEAN) 0.65 % SOLN nasal spray Place 1 spray into both nostrils as needed (swelling).  . tacrolimus (PROGRAF) 1 MG capsule Take 3-4 mg by mouth 2 (two) times daily. 4 mg in the morning and 3 mg in the evening  . tadalafil (CIALIS) 20 MG tablet Take 20 mg by mouth daily as needed for erectile dysfunction.  . tamsulosin (FLOMAX) 0.4 MG CAPS capsule Take 0.4 mg by mouth daily.  Marland Kitchen zolpidem (AMBIEN) 10 MG tablet Take 5 mg by mouth at bedtime.     12 system ROS was negative unless otherwise noted in HPI   Observations/Objective:  Telephone visit - no distress on the phone  Assessment and Plan:  63 year old male that had bilateral common iliac stents as well as bilateral common femoral to below-knee popliteal bypass with saphenous vein by Dr. Bridgett Larsson in the past that presents for 53-month follow-up via telephone visit.  I reviewed his noninvasive imaging and both of his bypasses remain patent.  The velocities are essentially unchanged from last year.  His ABIs have decreased slightly and are now 0.86 on the right and 0.91 on the left.  He does have a right common iliac stenosis with a velocity of 270 increased from 250 last year that has been under surveillance.  He reports no new issues with his legs.  He is able to walk and exercise and mow his lawn and ask about running today.  We discussed planning another set of surveillance imaging in 6 months to monitor particularly the right iliac.  Discussed he contact our office if he develops new symptoms in his legs particularly claudication or anything that would indicate needing intervention before his next follow-up.  Follow Up Instructions:   Follow up 6 months with repeat non-invasive imaging   I discussed the assessment and treatment plan with the patient. The patient was provided an opportunity to ask questions and all were answered. The patient agreed with the plan and demonstrated an understanding of the instructions.   The patient was advised to  call back or seek an in-person evaluation if the symptoms worsen or if the condition fails to improve as anticipated.  I spent 15 minutes with the patient via telephone encounter.   Signed, [name] [facility] Office: [phone]  11/21/2018, 9:50 AM

## 2018-11-27 DIAGNOSIS — Z940 Kidney transplant status: Secondary | ICD-10-CM | POA: Diagnosis not present

## 2018-11-27 DIAGNOSIS — D649 Anemia, unspecified: Secondary | ICD-10-CM | POA: Diagnosis not present

## 2018-11-27 DIAGNOSIS — N319 Neuromuscular dysfunction of bladder, unspecified: Secondary | ICD-10-CM | POA: Diagnosis not present

## 2018-11-27 DIAGNOSIS — I129 Hypertensive chronic kidney disease with stage 1 through stage 4 chronic kidney disease, or unspecified chronic kidney disease: Secondary | ICD-10-CM | POA: Diagnosis not present

## 2018-11-27 DIAGNOSIS — N390 Urinary tract infection, site not specified: Secondary | ICD-10-CM | POA: Diagnosis not present

## 2019-01-26 DIAGNOSIS — G4700 Insomnia, unspecified: Secondary | ICD-10-CM | POA: Diagnosis not present

## 2019-01-26 DIAGNOSIS — E785 Hyperlipidemia, unspecified: Secondary | ICD-10-CM | POA: Diagnosis not present

## 2019-01-26 DIAGNOSIS — Z23 Encounter for immunization: Secondary | ICD-10-CM | POA: Diagnosis not present

## 2019-01-26 DIAGNOSIS — I10 Essential (primary) hypertension: Secondary | ICD-10-CM | POA: Diagnosis not present

## 2019-01-26 DIAGNOSIS — Z940 Kidney transplant status: Secondary | ICD-10-CM | POA: Diagnosis not present

## 2019-02-04 ENCOUNTER — Inpatient Hospital Stay (HOSPITAL_COMMUNITY)
Admission: EM | Admit: 2019-02-04 | Discharge: 2019-02-07 | DRG: 368 | Disposition: A | Discharge status: Home / Self Care | Payer: Medicare Other | Attending: Family Medicine | Admitting: Family Medicine

## 2019-02-04 ENCOUNTER — Emergency Department (HOSPITAL_COMMUNITY): Payer: Medicare Other

## 2019-02-04 ENCOUNTER — Other Ambulatory Visit: Payer: Self-pay

## 2019-02-04 DIAGNOSIS — I959 Hypotension, unspecified: Secondary | ICD-10-CM | POA: Diagnosis present

## 2019-02-04 DIAGNOSIS — K449 Diaphragmatic hernia without obstruction or gangrene: Secondary | ICD-10-CM | POA: Diagnosis present

## 2019-02-04 DIAGNOSIS — I10 Essential (primary) hypertension: Secondary | ICD-10-CM | POA: Diagnosis present

## 2019-02-04 DIAGNOSIS — N186 End stage renal disease: Secondary | ICD-10-CM | POA: Diagnosis present

## 2019-02-04 DIAGNOSIS — E861 Hypovolemia: Secondary | ICD-10-CM | POA: Diagnosis present

## 2019-02-04 DIAGNOSIS — N319 Neuromuscular dysfunction of bladder, unspecified: Secondary | ICD-10-CM | POA: Diagnosis not present

## 2019-02-04 DIAGNOSIS — R1111 Vomiting without nausea: Secondary | ICD-10-CM | POA: Diagnosis not present

## 2019-02-04 DIAGNOSIS — F329 Major depressive disorder, single episode, unspecified: Secondary | ICD-10-CM | POA: Diagnosis present

## 2019-02-04 DIAGNOSIS — Z905 Acquired absence of kidney: Secondary | ICD-10-CM

## 2019-02-04 DIAGNOSIS — D84821 Immunodeficiency due to drugs: Secondary | ICD-10-CM | POA: Diagnosis present

## 2019-02-04 DIAGNOSIS — I120 Hypertensive chronic kidney disease with stage 5 chronic kidney disease or end stage renal disease: Secondary | ICD-10-CM | POA: Diagnosis present

## 2019-02-04 DIAGNOSIS — R110 Nausea: Secondary | ICD-10-CM | POA: Diagnosis not present

## 2019-02-04 DIAGNOSIS — K219 Gastro-esophageal reflux disease without esophagitis: Secondary | ICD-10-CM | POA: Diagnosis not present

## 2019-02-04 DIAGNOSIS — Z79899 Other long term (current) drug therapy: Secondary | ICD-10-CM | POA: Diagnosis not present

## 2019-02-04 DIAGNOSIS — T8619 Other complication of kidney transplant: Secondary | ICD-10-CM | POA: Diagnosis not present

## 2019-02-04 DIAGNOSIS — R0602 Shortness of breath: Secondary | ICD-10-CM

## 2019-02-04 DIAGNOSIS — K226 Gastro-esophageal laceration-hemorrhage syndrome: Secondary | ICD-10-CM | POA: Diagnosis not present

## 2019-02-04 DIAGNOSIS — E785 Hyperlipidemia, unspecified: Secondary | ICD-10-CM | POA: Diagnosis present

## 2019-02-04 DIAGNOSIS — Z8249 Family history of ischemic heart disease and other diseases of the circulatory system: Secondary | ICD-10-CM

## 2019-02-04 DIAGNOSIS — K648 Other hemorrhoids: Secondary | ICD-10-CM | POA: Diagnosis present

## 2019-02-04 DIAGNOSIS — I739 Peripheral vascular disease, unspecified: Secondary | ICD-10-CM | POA: Diagnosis present

## 2019-02-04 DIAGNOSIS — K922 Gastrointestinal hemorrhage, unspecified: Secondary | ICD-10-CM | POA: Diagnosis not present

## 2019-02-04 DIAGNOSIS — Z791 Long term (current) use of non-steroidal anti-inflammatories (NSAID): Secondary | ICD-10-CM

## 2019-02-04 DIAGNOSIS — Z91048 Other nonmedicinal substance allergy status: Secondary | ICD-10-CM

## 2019-02-04 DIAGNOSIS — R197 Diarrhea, unspecified: Secondary | ICD-10-CM | POA: Diagnosis not present

## 2019-02-04 DIAGNOSIS — R1110 Vomiting, unspecified: Secondary | ICD-10-CM | POA: Diagnosis not present

## 2019-02-04 DIAGNOSIS — R1084 Generalized abdominal pain: Secondary | ICD-10-CM | POA: Diagnosis not present

## 2019-02-04 DIAGNOSIS — B182 Chronic viral hepatitis C: Secondary | ICD-10-CM | POA: Diagnosis present

## 2019-02-04 DIAGNOSIS — H538 Other visual disturbances: Secondary | ICD-10-CM | POA: Diagnosis not present

## 2019-02-04 DIAGNOSIS — D62 Acute posthemorrhagic anemia: Secondary | ICD-10-CM | POA: Diagnosis present

## 2019-02-04 DIAGNOSIS — E7800 Pure hypercholesterolemia, unspecified: Secondary | ICD-10-CM | POA: Diagnosis present

## 2019-02-04 DIAGNOSIS — Z87891 Personal history of nicotine dependence: Secondary | ICD-10-CM

## 2019-02-04 DIAGNOSIS — Z7982 Long term (current) use of aspirin: Secondary | ICD-10-CM | POA: Diagnosis not present

## 2019-02-04 DIAGNOSIS — Z20828 Contact with and (suspected) exposure to other viral communicable diseases: Secondary | ICD-10-CM | POA: Diagnosis present

## 2019-02-04 DIAGNOSIS — Z940 Kidney transplant status: Secondary | ICD-10-CM

## 2019-02-04 DIAGNOSIS — J449 Chronic obstructive pulmonary disease, unspecified: Secondary | ICD-10-CM | POA: Diagnosis not present

## 2019-02-04 DIAGNOSIS — E7849 Other hyperlipidemia: Secondary | ICD-10-CM | POA: Diagnosis not present

## 2019-02-04 DIAGNOSIS — F909 Attention-deficit hyperactivity disorder, unspecified type: Secondary | ICD-10-CM | POA: Diagnosis present

## 2019-02-04 DIAGNOSIS — N179 Acute kidney failure, unspecified: Secondary | ICD-10-CM | POA: Diagnosis not present

## 2019-02-04 DIAGNOSIS — Y830 Surgical operation with transplant of whole organ as the cause of abnormal reaction of the patient, or of later complication, without mention of misadventure at the time of the procedure: Secondary | ICD-10-CM | POA: Diagnosis present

## 2019-02-04 DIAGNOSIS — K920 Hematemesis: Secondary | ICD-10-CM | POA: Diagnosis not present

## 2019-02-04 DIAGNOSIS — R58 Hemorrhage, not elsewhere classified: Secondary | ICD-10-CM | POA: Diagnosis not present

## 2019-02-04 LAB — CBC
HCT: 28.7 % — ABNORMAL LOW (ref 39.0–52.0)
Hemoglobin: 9.1 g/dL — ABNORMAL LOW (ref 13.0–17.0)
MCH: 29.6 pg (ref 26.0–34.0)
MCHC: 31.7 g/dL (ref 30.0–36.0)
MCV: 93.5 fL (ref 80.0–100.0)
Platelets: 200 10*3/uL (ref 150–400)
RBC: 3.07 MIL/uL — ABNORMAL LOW (ref 4.22–5.81)
RDW: 14.2 % (ref 11.5–15.5)
WBC: 15.8 10*3/uL — ABNORMAL HIGH (ref 4.0–10.5)
nRBC: 0 % (ref 0.0–0.2)

## 2019-02-04 LAB — PROTIME-INR
INR: 1.1 (ref 0.8–1.2)
Prothrombin Time: 13.6 seconds (ref 11.4–15.2)

## 2019-02-04 LAB — TROPONIN I (HIGH SENSITIVITY)
Troponin I (High Sensitivity): 18 ng/L — ABNORMAL HIGH (ref <18)
Troponin I (High Sensitivity): 28 ng/L — ABNORMAL HIGH (ref <18)

## 2019-02-04 LAB — COMPREHENSIVE METABOLIC PANEL
ALT: 11 U/L (ref 0–44)
AST: 15 U/L (ref 15–41)
Albumin: 3.5 g/dL (ref 3.5–5.0)
Alkaline Phosphatase: 35 U/L — ABNORMAL LOW (ref 38–126)
Anion gap: 12 (ref 5–15)
BUN: 51 mg/dL — ABNORMAL HIGH (ref 8–23)
CO2: 21 mmol/L — ABNORMAL LOW (ref 22–32)
Calcium: 9.6 mg/dL (ref 8.9–10.3)
Chloride: 107 mmol/L (ref 98–111)
Creatinine, Ser: 2.25 mg/dL — ABNORMAL HIGH (ref 0.61–1.24)
GFR calc Af Amer: 35 mL/min — ABNORMAL LOW (ref >60)
GFR calc non Af Amer: 30 mL/min — ABNORMAL LOW (ref >60)
Glucose, Bld: 140 mg/dL — ABNORMAL HIGH (ref 70–99)
Potassium: 4.4 mmol/L (ref 3.5–5.1)
Sodium: 140 mmol/L (ref 135–145)
Total Bilirubin: 0.5 mg/dL (ref 0.3–1.2)
Total Protein: 6.2 g/dL — ABNORMAL LOW (ref 6.5–8.1)

## 2019-02-04 LAB — POC OCCULT BLOOD, ED: Fecal Occult Bld: POSITIVE — AB

## 2019-02-04 LAB — CREATININE, URINE, RANDOM: Creatinine, Urine: 321.61 mg/dL

## 2019-02-04 LAB — LACTIC ACID, PLASMA
Lactic Acid, Venous: 3.4 mmol/L (ref 0.5–1.9)
Lactic Acid, Venous: 1.5 mmol/L (ref 0.5–1.9)

## 2019-02-04 LAB — HEMOGLOBIN AND HEMATOCRIT, BLOOD
HCT: 24.2 % — ABNORMAL LOW (ref 39.0–52.0)
Hemoglobin: 7.5 g/dL — ABNORMAL LOW (ref 13.0–17.0)

## 2019-02-04 LAB — PREPARE RBC (CROSSMATCH)

## 2019-02-04 LAB — APTT: aPTT: 24 seconds (ref 24–36)

## 2019-02-04 LAB — ABO/RH: ABO/RH(D): O POS

## 2019-02-04 LAB — LIPASE, BLOOD: Lipase: 15 U/L (ref 11–51)

## 2019-02-04 LAB — SODIUM, URINE, RANDOM: Sodium, Ur: 46 mmol/L

## 2019-02-04 LAB — MRSA PCR SCREENING: MRSA by PCR: NEGATIVE

## 2019-02-04 MED ORDER — MYCOPHENOLATE MOFETIL 250 MG PO CAPS
500.0000 mg | ORAL_CAPSULE | Freq: Two times a day (BID) | ORAL | Status: DC
  Administered 2019-02-04 – 2019-02-05 (×2): 500 mg via ORAL
  Filled 2019-02-04 (×3): qty 2

## 2019-02-04 MED ORDER — TACROLIMUS 1 MG PO CAPS
4.0000 mg | ORAL_CAPSULE | Freq: Every day | ORAL | Status: DC
  Administered 2019-02-05 – 2019-02-07 (×3): 4 mg via ORAL
  Filled 2019-02-04 (×4): qty 4

## 2019-02-04 MED ORDER — ACETAMINOPHEN 650 MG RE SUPP: 650.0000 mg | Freq: Four times a day (QID) | RECTAL | Status: DC | PRN

## 2019-02-04 MED ORDER — SODIUM CHLORIDE 0.9 % IV BOLUS
1000.0000 mL | Freq: Once | INTRAVENOUS | Status: AC
  Administered 2019-02-04: 1000 mL via INTRAVENOUS

## 2019-02-04 MED ORDER — ROSUVASTATIN CALCIUM 5 MG PO TABS
5.0000 mg | ORAL_TABLET | Freq: Every day | ORAL | Status: DC
  Administered 2019-02-04 – 2019-02-06 (×3): 5 mg via ORAL
  Filled 2019-02-04 (×3): qty 1

## 2019-02-04 MED ORDER — SODIUM CHLORIDE 0.9 % IV BOLUS
1000.0000 mL | Freq: Once | INTRAVENOUS | Status: AC
  Administered 2019-02-04: 18:00:00 1000 mL via INTRAVENOUS

## 2019-02-04 MED ORDER — ACETAMINOPHEN 325 MG PO TABS
650.0000 mg | ORAL_TABLET | Freq: Four times a day (QID) | ORAL | Status: DC | PRN
  Administered 2019-02-04 – 2019-02-06 (×3): 650 mg via ORAL
  Filled 2019-02-04 (×3): qty 2

## 2019-02-04 MED ORDER — FENTANYL CITRATE (PF) 100 MCG/2ML IJ SOLN
25.0000 ug | Freq: Once | INTRAMUSCULAR | Status: AC
  Administered 2019-02-04: 18:00:00 25 ug via INTRAVENOUS
  Filled 2019-02-04: qty 2

## 2019-02-04 MED ORDER — CHLORHEXIDINE GLUCONATE CLOTH 2 % EX PADS
6.0000 | MEDICATED_PAD | Freq: Every day | CUTANEOUS | Status: DC
  Administered 2019-02-04 – 2019-02-06 (×3): 6 via TOPICAL

## 2019-02-04 MED ORDER — ONDANSETRON HCL 4 MG PO TABS: 4.0000 mg | ORAL_TABLET | Freq: Four times a day (QID) | ORAL | Status: DC | PRN

## 2019-02-04 MED ORDER — SODIUM CHLORIDE 0.9% IV SOLUTION
Freq: Once | INTRAVENOUS | Status: AC
  Administered 2019-02-04: 10 mL/h via INTRAVENOUS

## 2019-02-04 MED ORDER — SODIUM CHLORIDE 0.9 % IV SOLN
INTRAVENOUS | Status: AC
  Administered 2019-02-04: 23:00:00 via INTRAVENOUS

## 2019-02-04 MED ORDER — SODIUM CHLORIDE 0.9% FLUSH
3.0000 mL | Freq: Two times a day (BID) | INTRAVENOUS | Status: DC
  Administered 2019-02-05: via INTRAVENOUS
  Administered 2019-02-05 – 2019-02-07 (×4): 3 mL via INTRAVENOUS

## 2019-02-04 MED ORDER — PANTOPRAZOLE SODIUM 40 MG IV SOLR
40.0000 mg | Freq: Two times a day (BID) | INTRAVENOUS | Status: DC
  Administered 2019-02-04 – 2019-02-07 (×6): 40 mg via INTRAVENOUS
  Filled 2019-02-04 (×6): qty 40

## 2019-02-04 MED ORDER — TACROLIMUS 1 MG PO CAPS
3.0000 mg | ORAL_CAPSULE | Freq: Every day | ORAL | Status: DC
  Administered 2019-02-04 – 2019-02-06 (×3): 3 mg via ORAL
  Filled 2019-02-04 (×5): qty 3

## 2019-02-04 MED ORDER — ONDANSETRON HCL 4 MG/2ML IJ SOLN: 4.0000 mg | Freq: Four times a day (QID) | INTRAMUSCULAR | Status: DC | PRN

## 2019-02-04 NOTE — ED Triage Notes (Signed)
Pt to ED via EMS from home, renal transplant patient (2010) , pt went out to eat Friday evening, Saturday morning pt started to vomit and diarrhea. Today pt notes blood in vomit, and dark stools. Pt denies hx of GI bleed. No medications given by EMS.

## 2019-02-04 NOTE — ED Notes (Signed)
ED TO INPATIENT HANDOFF REPORT  ED Nurse Name and Phone #:  Leafy Kindle [phone]  S Name/Age/Gender Scott Chavez 63 y.o. male Room/Bed: WA07/WA07  Code Status   Code Status: Full Code  Home/SNF/Other Home Patient oriented to: self, place, time and situation Is this baseline? Yes   Triage Complete: Triage complete  Chief Complaint GI Bleed   Triage Note Pt to ED via EMS from home, renal transplant patient (2010) , pt went out to eat Friday evening, Saturday morning pt started to vomit and diarrhea. Today pt notes blood in vomit, and dark stools. Pt denies hx of GI bleed. No medications given by EMS.    Allergies Allergies  Allergen Reactions  . Tape Rash and Other (See Comments)    NO Silk tape, please!!    Level of Care/Admitting Diagnosis ED Disposition    ED Disposition Condition Comment   Admit  Hospital Area: [place] [[id number]]  Level of Care: Stepdown [14]  Admit to SDU based on following criteria: Hemodynamic compromise or significant risk of instability:  Patient requiring short term acute titration and management of vasoactive drips, and invasive monitoring (i.e., CVP and Arterial line).  Admit to SDU based on following criteria: Other see comments  Comments: GI bleeding, at risk for hemorrhagic shock.  Covid Evaluation: Asymptomatic Screening Protocol (No Symptoms)  Diagnosis: GI bleeding [709628]  Admitting Physician: Lenore Cordia [[id number]]  Attending Physician: Lenore Cordia [[id number]]  PT Class (Do Not Modify): Observation [104]  PT Acc Code (Do Not Modify): Observation [10022]       B Medical/Surgery History Past Medical History:  Diagnosis Date  . ADHD   . Anemia    pt. not sure, but reports he is taking Fe for one month now.  . Aorto-iliac disease ([place])   . Bladder dysfunction   . ESRD (end stage renal disease) [facility])    [facility]- [facility] , followed by Dr. Moshe Cipro   . GERD  (gastroesophageal reflux disease)   . Hemodialysis patient [facility])    "on dialysis 1998-2010" 04/25/2013)  . Hepatitis C    has been treated with Harvoni  . History of renal failure   . Hypercholesteremia   . Hypertension   . Internal hemorrhoids    Colonoscopy 2010 and anoscopy 2015  . Peripheral vascular disease ([place])   . Pneumonia   . Self-catheterizes urinary bladder    pt. choice, but he reports that he desires to do this to avoid retention    Past Surgical History:  Procedure Laterality Date  . ABDOMINAL AORTAGRAM N/A 03/09/2013   Procedure: ABDOMINAL Maxcine Ham;  Surgeon: Elam Dutch, MD;  Location: [facility] CATH LAB;  Service: Cardiovascular;  Laterality: N/A;  . AV FISTULA PLACEMENT Left 1998   "removed 2011" (04/25/2013)  . AV FISTULA REPAIR Left    removal with partial vein removed  . COLONOSCOPY    . ENDARTERECTOMY FEMORAL Right 04/17/2015   Procedure: ENDARTERECTOMY RIGHT ILIOFEMORAL WITH PROFUNDOPLASTY;  Surgeon: Conrad [place], MD;  Location: [place];  Service: Vascular;  Laterality: Right;  . ENDARTERECTOMY FEMORAL Right 10/25/2016   Procedure: RIGHT ILIOFEMORAL ENDARTERECTOMY WITH BOVINE PERICARDIAL PATCH ANGIOPLASTY;  Surgeon: Conrad Red Corral, MD;  Location: [place];  Service: Vascular;  Laterality: Right;  . FEMORAL-POPLITEAL BYPASS GRAFT Left 04/24/2013   Procedure: BYPASS GRAFT COMMON FEMORALARTERY-BELOW KNEE POPLITEAL ARTERY WITH GREATER SAPHENOUS VEIN;  Surgeon: Conrad [place], MD;  Location: [place];  Service: Vascular;  Laterality: Left;  .  FEMORAL-POPLITEAL BYPASS GRAFT Right 04/17/2015   Procedure: BYPASS GRAFT RIGHT FEMORALTO BELOW KNEE POPLITEAL ARTERY USING RIGHT NON REVERSED GREATER SAPPHENOUS VEIN;  Surgeon: Conrad Searchlight, MD;  Location: [place];  Service: Vascular;  Laterality: Right;  . INTRAOPERATIVE ARTERIOGRAM Left 04/24/2013   Procedure: INTRA OPERATIVE ARTERIOGRAM;  Surgeon: Conrad [place], MD;  Location: [place];  Service: Vascular;  Laterality: Left;  . KIDNEY  TRANSPLANT    . LOWER EXTREMITY ANGIOGRAM Bilateral 03/09/2013   Procedure: LOWER EXTREMITY ANGIOGRAM;  Surgeon: Elam Dutch, MD;  [facility];  Service: Cardiovascular;  Laterality: Bilateral;  . LOWER EXTREMITY ANGIOGRAM Right 10/25/2013   Procedure: LOWER EXTREMITY ANGIOGRAM;  Surgeon: Conrad Pablo, MD;  Location: [facility] C[facility] LAB;  Service: Cardiovascular;  Laterality: Right;  . LOWER EXTREMITY ANGIOGRAM Right 12/13/2013   Procedure: LOWER EXTREMITY ANGIOGRAM;  Surgeon: Conrad Hawley, MD;  Location: [facility] CATH LAB;  Service: Cardiovascular;  Laterality: Right;  . NEPHRECTOMY RECIPIENT  2010  . NEPHRECTOMY TRANSPLANTED ORGAN    . PATCH ANGIOPLASTY Right 04/17/2015   Procedure: Rueben Bash PATCH ANGIOPLASTY, RIGHT ILEOFEMORAL;  Surgeon: Conrad Scotia, MD;  Location: [place];  Service: Vascular;  Laterality: Right;  . PERIPHERAL VASCULAR CATHETERIZATION N/A 04/10/2015   Procedure: Abdominal Aortogram;  Surgeon: Conrad Olcott, MD;  Location: [place] CV LAB;  Service: Cardiovascular;  Laterality: N/A;  . PERIPHERAL VASCULAR CATHETERIZATION N/A 05/10/2016   Procedure: Abdominal Aortogram w/Lower Extremity;  Surgeon: Conrad Harper, MD;  Location: [place] CV LAB;  Service: Cardiovascular;  Laterality: N/A;  . s/p cadaveric renal transplant  December 2010   [place] Left 09/24/2015   Procedure: EXCISION OF THROMBOSED RADIOCEPHALIC ARTERIOVENOUS FISTULA;  Surgeon: Conrad Amagansett, MD;  Location: [place];  Service: Vascular;  Laterality: Left;  Marland Kitchen VEIN HARVEST Right 04/17/2015   Procedure: RIGHT GREATER SAPPHENOUS VEIN HARVEST ;  Surgeon: Conrad Turley, MD;  Location: [place];  Service: Vascular;  Laterality: Right;     A IV Location/Drains/Wounds Patient Lines/Drains/Airways Status   Active Line/Drains/Airways    Name:   Placement date:   Placement time:   Site:   Days:   Peripheral IV 02/04/19 Right Arm   02/04/19    1740    Arm   less than 1   Fistula / Graft   -    -     -      Incision 04/24/13 Leg Left   04/24/13    1355     2112   Incision 04/24/13 Groin Left   04/24/13    1417     2112   Incision 04/24/13 Arm Left   04/24/13    1429     2112   Incision (Closed) 04/17/15 Leg Right   04/17/15    1645     1389   Incision (Closed) 04/17/15 Groin Right   04/17/15    1715     1389   Incision (Closed) 04/17/15 Leg Right   04/17/15    1715     1389   Incision (Closed) 04/17/15 Thigh Right   04/17/15    1715     1389   Incision (Closed) 04/17/15 Thigh Right   04/17/15    1716     1389   Incision (Closed) 09/24/15 Arm Left   09/24/15    0906     1229   Incision (Closed) 10/25/16 Groin Right   10/25/16  0258     832          Intake/Output Last 24 hours  Intake/Output Summary (Last 24 hours) at 02/04/2019 2125 Last data filed at 02/04/2019 2037 Gross per 24 hour  Intake 1000 ml  Output -  Net 1000 ml    Labs/Imaging Results for orders placed or performed during the hospital encounter of 02/04/19 (from the past 48 hour(s))  Lactic acid, plasma     Status: Abnormal   Collection Time: 02/04/19  5:36 PM  Result Value Ref Range   Lactic Acid, Venous 3.4 (HH) 0.5 - 1.9 mmol/L    Comment: CRITICAL RESULT CALLED TO, READ BACK BY AND VERIFIED WITH: K.ZULETA,RN [id number] @1932  BY V.WILKINS Performed at [facility], [place] [address], [place], [place] [address]   CBC     Status: Abnormal   Collection Time: 02/04/19  5:36 PM  Result Value Ref Range   WBC 15.8 (H) 4.0 - 10.5 K/uL   RBC 3.07 (L) 4.22 - 5.81 MIL/uL   Hemoglobin 9.1 (L) 13.0 - 17.0 g/dL   HCT 28.7 (L) 39.0 - 52.0 %   MCV 93.5 80.0 - 100.0 fL   MCH 29.6 26.0 - 34.0 pg   MCHC 31.7 30.0 - 36.0 g/dL   RDW 14.2 11.5 - 15.5 %   Platelets 200 150 - 400 K/uL   nRBC 0.0 0.0 - 0.2 %    Comment: Performed at [facility], [place] [address], [place], [place] [address]  Comprehensive metabolic panel     Status: Abnormal   Collection Time: 02/04/19  5:36 PM  Result  Value Ref Range   Sodium 140 135 - 145 mmol/L   Potassium 4.4 3.5 - 5.1 mmol/L   Chloride 107 98 - 111 mmol/L   CO2 21 (L) 22 - 32 mmol/L   Glucose, Bld 140 (H) 70 - 99 mg/dL   BUN 51 (H) 8 - 23 mg/dL   Creatinine, Ser 2.25 (H) 0.61 - 1.24 mg/dL   Calcium 9.6 8.9 - 10.3 mg/dL   Total Protein 6.2 (L) 6.5 - 8.1 g/dL   Albumin 3.5 3.5 - 5.0 g/dL   AST 15 15 - 41 U/L   ALT 11 0 - 44 U/L   Alkaline Phosphatase 35 (L) 38 - 126 U/L   Total Bilirubin 0.5 0.3 - 1.2 mg/dL   GFR calc non Af Amer 30 (L) >60 mL/min   GFR calc Af Amer 35 (L) >60 mL/min   Anion gap 12 5 - 15    Comment: Performed at [facility], [place] [address], [place], [place] [address]  Lipase, blood     Status: None   Collection Time: 02/04/19  5:36 PM  Result Value Ref Range   Lipase 15 11 - 51 U/L    Comment: Performed at [facility], [place] [address], [place], [place] [address]  Troponin I (High Sensitivity)     Status: Abnormal   Collection Time: 02/04/19  5:36 PM  Result Value Ref Range   Troponin I (High Sensitivity) 18 (H) <18 ng/L    Comment: (NOTE) Elevated high sensitivity troponin I (hsTnI) values and significant  changes across serial measurements may suggest ACS but many other  chronic and acute conditions are known to elevate hsTnI results.  Refer to the "Links" section for chest pain algorithms and additional  guidance. Performed at [facility], [place] [address] [place], [place] [address]   Protime-INR  Status: None   Collection Time: 02/04/19  5:36 PM  Result Value Ref Range   Prothrombin Time 13.6 11.4 - 15.2 seconds   INR 1.1 0.8 - 1.2    Comment: (NOTE) INR goal varies based on device and disease states. Performed at [facility], [place] [address] [place], [place] [address]   APTT     Status: None   Collection Time: 02/04/19  5:36 PM  Result Value Ref Range   aPTT 24 24 - 36 seconds    Comment: Performed at [facility], [place] [address], [place], [place] [address]  Type and screen [place]     Status: None   Collection Time: 02/04/19  5:36 PM  Result Value Ref Range   ABO/RH(D) O POS    Antibody Screen NEG    Sample Expiration      02/07/2019,2359 Performed at [facility], [place] [address], [place], [place] [address]   POC occult blood, ED     Status: Abnormal   Collection Time: 02/04/19  5:52 PM  Result Value Ref Range   Fecal Occult Bld POSITIVE (A) NEGATIVE  Troponin I (High Sensitivity)     Status: Abnormal   Collection Time: 02/04/19  8:22 PM  Result Value Ref Range   Troponin I (High Sensitivity) 28 (H) <18 ng/L    Comment: (NOTE) Elevated high sensitivity troponin I (hsTnI) values and significant  changes across serial measurements may suggest ACS but many other  chronic and acute conditions are known to elevate hsTnI results.  Refer to the "Links" section for chest pain algorithms and additional  guidance. Performed at [facility], [place] [address] [place], [place] [address]   Lactic acid, plasma     Status: None   Collection Time: 02/04/19  8:22 PM  Result Value Ref Range   Lactic Acid, Venous 1.5 0.5 - 1.9 mmol/L    Comment: Performed at [facility], [place] [address], [place], [place] [address]   Ct Abdomen Pelvis Wo Contrast  Result Date: 02/04/2019 CLINICAL DATA:  Abdominal pain, prior renal transplant, vomiting and diarrhea EXAM: CT ABDOMEN AND PELVIS WITHOUT CONTRAST TECHNIQUE: Multidetector CT imaging of the abdomen and pelvis was performed following the standard protocol without IV contrast. COMPARISON:  December 28, 2015 FINDINGS: Lower chest: The visualized heart size within normal limits. Small pericardial effusion/thickening. A small hiatal hernia present. The visualized portions of the lungs are clear. Hepatobiliary: Although limited due to the lack of intravenous contrast,  normal in appearance without gross focal abnormality. No evidence of calcified gallstones or biliary ductal dilatation. Pancreas:  Unremarkable.  No surrounding inflammatory changes. Spleen: Normal in size. Although limited due to the lack of intravenous contrast, normal in appearance. Adrenals/Urinary Tract: Both adrenal glands appear normal. There is atrophy of the bilateral native kidneys. A right lower pelvic kidney transplant is visualized. A posterior left bladder wall diverticulum is seen. Stomach/Bowel: The stomach, small bowel, and colon are normal in appearance. Scattered colonic diverticula are noted without diverticulitis. No inflammatory changes or obstructive findings. appendix is normal. Vascular/Lymphatic: There are no enlarged abdominal or pelvic lymph nodes. Dense aortic atherosclerosis is noted throughout. Reproductive: The prostate is unremarkable. Other: No evidence of abdominal wall mass or hernia. Musculoskeletal: No acute or significant osseous findings. IMPRESSION: no acute intra-abdominal or pelvic pathology. Electronically Signed   By: Prudencio Pair M.D.   On: 02/04/2019 19:55   Dg Chest Port 1  View  Result Date: 02/04/2019 CLINICAL DATA:  Hematemesis. Dark stools. Diarrhea.  Ex-smoker. EXAM: PORTABLE CHEST 1 VIEW COMPARISON:  09/21/2018 FINDINGS: Normal sized heart. Clear lungs. The lungs remain hyperexpanded with bilateral upper lobe bullous changes. Unremarkable bones. IMPRESSION: No acute abnormality. Stable changes of COPD. Electronically Signed   By: Claudie Revering M.D.   On: 02/04/2019 18:00    Pending Labs Unresulted Labs (From admission, onward)    Start     Ordered   02/05/19 2831  Basic metabolic panel  Tomorrow morning,   R     02/04/19 2110   02/05/19 0500  CBC  Tomorrow morning,   R     02/04/19 2110   02/04/19 2300  Hemoglobin and hematocrit, blood  Now then every 6 hours,   R (with STAT occurrences)     02/04/19 2111   02/04/19 2117  Prepare RBC  (Adult Blood  Administration - Red Blood Cells)  Once,   R    Question Answer Comment  # of Units 1 unit   Transfusion Indications Actively Bleeding / GI Bleed   Number of Units to Keep Ahead 4 units ahead   If emergent release call blood bank Not emergent release   Instructions: Transfuse      02/04/19 2116   02/04/19 2110  HIV Antibody (routine testing w rflx)  (HIV Antibody (Routine testing w reflex) panel)  Add-on,   AD     02/04/19 2110   02/04/19 2110  HIV4GL Save Tube  (HIV Antibody (Routine testing w reflex) panel)  Add-on,   AD     02/04/19 2110   02/04/19 2013  SARS CORONAVIRUS 2 (TAT 6-24 HRS) Nasopharyngeal Nasopharyngeal Swab  (Asymptomatic/Tier 2 Patients Labs)  Once,   STAT    Question Answer Comment  Is this test for diagnosis or screening Screening   Symptomatic for COVID-19 as defined by CDC No   Hospitalized for COVID-19 No   Admitted to ICU for COVID-19 No   Previously tested for COVID-19 No   Resident in a congregate (group) care setting Unknown   Employed in healthcare setting Unknown      02/04/19 2012   02/04/19 1924  ABO/Rh  Once,   R     02/04/19 1924   02/04/19 1710  Culture, blood (Routine X 2) w Reflex to ID Panel  BLOOD CULTURE X 2,   R (with STAT occurrences)     02/04/19 1710   02/04/19 1710  Occult blood card to lab, stool Provider will collect  Once,   STAT    Question:  Specimen to be collected by:  Answer:  Provider will collect   02/04/19 1710          Vitals/Pain Today's Vitals   02/04/19 2036 02/04/19 2036 02/04/19 2037 02/04/19 2100  BP: 119/69  119/69 (!) 118/52  Pulse:   (!) 110 (!) 105  Resp: (!) 21  (!) 25 16  Temp:   100.3 F (37.9 C)   TempSrc:   Oral   SpO2:   100% 96%  Weight:      Height:      PainSc:  9       Isolation Precautions No active isolations  Medications Medications  sodium chloride flush (NS) 0.9 % injection 3 mL (has no administration in time range)  0.9 %  sodium chloride infusion (has no administration in time  range)  acetaminophen (TYLENOL) tablet 650 mg (has no administration in time range)  Or  acetaminophen (TYLENOL) suppository 650 mg (has no administration in time range)  ondansetron (ZOFRAN) tablet 4 mg (has no administration in time range)    Or  ondansetron (ZOFRAN) injection 4 mg (has no administration in time range)  pantoprazole (PROTONIX) injection 40 mg (has no administration in time range)  mycophenolate (CELLCEPT) capsule 500 mg (has no administration in time range)  rosuvastatin (CRESTOR) tablet 5 mg (has no administration in time range)  tacrolimus (PROGRAF) capsule 3-4 mg (has no administration in time range)  0.9 %  sodium chloride infusion (Manually program via Guardrails IV Fluids) (has no administration in time range)  sodium chloride 0.9 % bolus 1,000 mL (0 mLs Intravenous Stopped 02/04/19 2037)  fentaNYL (SUBLIMAZE) injection 25 mcg (25 mcg Intravenous Given 02/04/19 1745)  sodium chloride 0.9 % bolus 1,000 mL (1,000 mLs Intravenous Bolus from Bag 02/04/19 2041)    Mobility walks with device Low fall risk   R Recommendations: See Admitting Provider Note  Report given to: Melinda 2W

## 2019-02-04 NOTE — H&P (Addendum)
History and Physical    Scott Chavez OBS:[id number] DOB: [birth year] DOA: 02/04/2019  PCP: Benito Mccreedy, MD  Patient coming from: Home  I have personally briefly reviewed patient's old medical records in [place]  Chief Complaint: GI bleeding  HPI: Scott Chavez is a 63 y.o. male with medical history significant for ESRD s/p DDRT in 2010 on immunosuppressants, PVD, hypertension, hyperlipidemia, GERD, chronic hepatitis C s/p treatment with Zepatier, neurogenic bladder, and ADHD who presents to the ED for evaluation of GI bleeding.  Patient states on evening of 02/02/2019 he went to a [employer] for dinner.  Early the next morning he began to have nausea, vomiting, abdominal pain, and diarrhea.  Initially emesis consisted of food content but later he began to see red blood in his vomitus.  He has also been seeing bright red blood per rectum as well as dark black color stool.  He has had associated lightheadedness without loss of consciousness.  He says he has not been able to maintain any adequate oral intake including taking his home medications.  He has had chills but denies chest pain or dyspnea.  He takes aspirin 81 mg daily and denies any NSAID use.  He denies any alcohol use.  Per records, most recent colonoscopy was in 2010 which showed internal hemorrhoids.  ED Course:  Initial vitals showed BP 115/54, pulse 108, RR 27, temp 97.8 Fahrenheit, SPO2 100% on room air.  Labs notable for WBC 15.8, hemoglobin 9.1 (14.1 on 09/27/2018), platelets 200,000, BUN 51, creatinine 2.25 (baseline creatinine 1.1-1.2), sodium 140, potassium 4.4, serum glucose 140, lipase 15, lactic acid 3.4, PT 13.6, INR 1.1, PTT 24, high-sensitivity troponin I 18.  FOBT is positive.  SARS-CoV-2 test was collected and pending.  Portable chest x-ray showed hyperexpanded lung fields without acute cardiopulmonary process.  CT abdomen/pelvis without contrast was negative for acute  intra-abdominal or pelvic pathology.  Patient was given 2 L normal saline and fentanyl for pain.  EDP Dr. Sedonia Small to discuss with GI and requesting hospitalist admission for further evaluation management.  Review of Systems: All systems reviewed and are negative except as documented in history of present illness above.   Past Medical History:  Diagnosis Date  . ADHD   . Anemia    pt. not sure, but reports he is taking Fe for one month now.  . Aorto-iliac disease ([place])   . Bladder dysfunction   . ESRD (end stage renal disease) [facility])    [facility] , followed by Dr. Moshe Cipro   . GERD (gastroesophageal reflux disease)   . Hemodialysis patient [facility])    "on dialysis 1998-2010" 04/25/2013)  . Hepatitis C    has been treated with Harvoni  . History of renal failure   . Hypercholesteremia   . Hypertension   . Internal hemorrhoids    Colonoscopy 2010 and anoscopy 2015  . Peripheral vascular disease ([place])   . Pneumonia   . Self-catheterizes urinary bladder    pt. choice, but he reports that he desires to do this to avoid retention     Past Surgical History:  Procedure Laterality Date  . ABDOMINAL AORTAGRAM N/A 03/09/2013   Procedure: ABDOMINAL Maxcine Ham;  Surgeon: Elam Dutch, MD;  Location[facility] CATH LAB;  Service: Cardiovascular;  Laterality: N/A;  . AV FISTULA PLACEMENT Left 1998   "removed 2011" (04/25/2013)  . AV FISTULA REPAIR Left    removal with partial vein removed  . COLONOSCOPY    .  ENDARTERECTOMY FEMORAL Right 04/17/2015   Procedure: ENDARTERECTOMY RIGHT ILIOFEMORAL WITH PROFUNDOPLASTY;  Surgeon: Conrad Rand, MD;  Location: [place];  Service: Vascular;  Laterality: Right;  . ENDARTERECTOMY FEMORAL Right 10/25/2016   Procedure: RIGHT ILIOFEMORAL ENDARTERECTOMY WITH BOVINE PERICARDIAL PATCH ANGIOPLASTY;  Surgeon: Conrad Manhasset, MD;  Location: [place];  Service: Vascular;  Laterality: Right;  . FEMORAL-POPLITEAL BYPASS GRAFT Left 04/24/2013    Procedure: BYPASS GRAFT COMMON FEMORALARTERY-BELOW KNEE POPLITEAL ARTERY WITH GREATER SAPHENOUS VEIN;  Surgeon: Conrad Chesterville, MD;  Location: [place];  Service: Vascular;  Laterality: Left;  . FEMORAL-POPLITEAL BYPASS GRAFT Right 04/17/2015   Procedure: BYPASS GRAFT RIGHT FEMORALTO BELOW KNEE POPLITEAL ARTERY USING RIGHT NON REVERSED GREATER SAPPHENOUS VEIN;  Surgeon: Conrad Enhaut, MD;  Location: [place];  Service: Vascular;  Laterality: Right;  . INTRAOPERATIVE ARTERIOGRAM Left 04/24/2013   Procedure: INTRA OPERATIVE ARTERIOGRAM;  Surgeon: Conrad Ocheyedan, MD;  Location: [place];  Service: Vascular;  Laterality: Left;  . KIDNEY TRANSPLANT    . LOWER EXTREMITY ANGIOGRAM Bilateral 03/09/2013   Procedure: LOWER EXTREMITY ANGIOGRAM;  Surgeon: Elam Dutch, MD;  Location: [facility] CATH LAB;  Service: Cardiovascular;  Laterality: Bilateral;  . LOWER EXTREMITY ANGIOGRAM Right 10/25/2013   Procedure: LOWER EXTREMITY ANGIOGRAM;  Surgeon: Conrad Christiansburg, MD;  Location: [facility] CATH LAB;  Service: Cardiovascular;  Laterality: Right;  . LOWER EXTREMITY ANGIOGRAM Right 12/13/2013   Procedure: LOWER EXTREMITY ANGIOGRAM;  Surgeon: Conrad Terril, MD;  Location: [facility] CATH LAB;  Service: Cardiovascular;  Laterality: Right;  . NEPHRECTOMY RECIPIENT  2010  . NEPHRECTOMY TRANSPLANTED ORGAN    . PATCH ANGIOPLASTY Right 04/17/2015   Procedure: Rueben Bash PATCH ANGIOPLASTY, RIGHT ILEOFEMORAL;  Surgeon: Conrad Spaulding, MD;  Location: [place];  Service: Vascular;  Laterality: Right;  . PERIPHERAL VASCULAR CATHETERIZATION N/A 04/10/2015   Procedure: Abdominal Aortogram;  Surgeon: Conrad Taneytown, MD;  Location: [place] CV LAB;  Service: Cardiovascular;  Laterality: N/A;  . PERIPHERAL VASCULAR CATHETERIZATION N/A 05/10/2016   Procedure: Abdominal Aortogram w/Lower Extremity;  Surgeon: Conrad La Follette, MD;  Location: [place] CV LAB;  Service: Cardiovascular;  Laterality: N/A;  . s/p cadaveric renal transplant  December 2010   [place] Left 09/24/2015   Procedure: EXCISION OF THROMBOSED RADIOCEPHALIC ARTERIOVENOUS FISTULA;  Surgeon: Conrad Fort Jennings, MD;  Location: [place];  Service: Vascular;  Laterality: Left;  Marland Kitchen VEIN HARVEST Right 04/17/2015   Procedure: RIGHT GREATER SAPPHENOUS VEIN HARVEST ;  Surgeon: Conrad Osceola, MD;  Location: [place];  Service: Vascular;  Laterality: Right;    Social History:  reports that he quit smoking about 15 years ago. His smoking use included e-cigarettes. He has a 17.00 pack-year smoking history. He has quit using smokeless tobacco. He reports that he does not drink alcohol or use drugs.  Allergies  Allergen Reactions  . Tape Rash and Other (See Comments)    NO Silk tape, please!!    Family History  Problem Relation Age of Onset  . Hypertension Father   . Other Father        amputation  . Hypertension Mother      Prior to Admission medications   Medication Sig Start Date End Date Taking? Authorizing Provider  albuterol (PROVENTIL HFA;VENTOLIN HFA) 108 (90 Base) MCG/ACT inhaler Inhale 1-2 puffs into the lungs every 6 (six) hours as needed for wheezing or shortness of breath. 07/17/18  Yes Rancour, Annie Main, MD  aspirin EC 81  MG tablet Take 81-162 mg by mouth daily.   Yes [provider]  citalopram (CELEXA) 40 MG tablet Take 20 mg by mouth 2 (two) times daily.  02/18/17  Yes [provider]  Cyanocobalamin (VITAMIN B-12 PO) Take 1 tablet by mouth daily.   Yes [provider]  cyclobenzaprine (FLEXERIL) 10 MG tablet Take 1 tablet (10 mg total) by mouth 2 (two) times daily as needed for muscle spasms. 09/21/18  Yes Gareth Morgan, MD  diphenhydramine-acetaminophen (TYLENOL PM) 25-500 MG TABS tablet Take 1 tablet by mouth at bedtime as needed (for pain or sleep).    Yes [provider]  ketotifen (ZADITOR) 0.025 % ophthalmic solution Place 1 drop into both eyes 2 (two) times daily as needed (for seasonal allergies).   Yes [provider]   lidocaine (LIDODERM) 5 % Place 1 patch onto the skin daily. Remove & Discard patch within 12 hours or as directed by MD Patient taking differently: Place 1 patch onto the skin daily as needed (pain). Remove & Discard patch within 12 hours or as directed by MD 11/19/16  Yes Conrad Wiconsico, MD  lisdexamfetamine (VYVANSE) 20 MG capsule Take 20 mg by mouth 2 (two) times daily.    Yes [provider]  metoprolol succinate (TOPROL-XL) 25 MG 24 hr tablet Take 25 mg by mouth 2 (two) times daily.  08/16/17  Yes [provider]  mycophenolate (CELLCEPT) 250 MG capsule Take 250-500 mg by mouth See admin instructions. Take 500 mg by mouth in the morning and 250 mg in the evening   Yes [provider]  naproxen (NAPROSYN) 500 MG tablet Take 1 tablet (500 mg total) by mouth 2 (two) times daily with a meal. As needed for pain Patient taking differently: Take 500 mg by mouth 2 (two) times daily as needed for mild pain.  09/10/17  Yes Dorie Rank, MD  NIFEdipine (PROCARDIA-XL/ADALAT-CC/NIFEDICAL-XL) 30 MG 24 hr tablet Take 30 mg by mouth daily.    Yes [provider]  omeprazole (PRILOSEC) 20 MG capsule Take 20 mg by mouth daily. Reported on 06/06/2015   Yes [provider]  pregabalin (LYRICA) 50 MG capsule Take 1 capsule (50 mg total) by mouth 3 (three) times daily. 01/08/16  Yes Conrad Monticello, MD  Pyridoxine HCl (VITAMIN B-6 PO) Take 1 tablet by mouth 3 (three) times a week.    Yes [provider]  rosuvastatin (CRESTOR) 5 MG tablet Take 5 mg by mouth at bedtime.    Yes [provider]  sodium chloride (OCEAN) 0.65 % SOLN nasal spray Place 1 spray into both nostrils as needed (swelling). 10/02/16  Yes Neese, [place] M, NP  tacrolimus (PROGRAF) 1 MG capsule Take 3-4 mg by mouth 2 (two) times daily. 4 mg in the morning and 3 mg in the evening   Yes [provider]  tadalafil (CIALIS) 20 MG tablet Take 20 mg by mouth daily as needed for erectile dysfunction.    Yes [provider]  tamsulosin (FLOMAX) 0.4 MG CAPS capsule Take 0.4 mg by mouth daily.   Yes [provider]  zolpidem (AMBIEN) 10 MG tablet Take 5 mg by mouth at bedtime.    Yes [provider]  doxycycline (VIBRAMYCIN) 100 MG capsule Take 1 capsule (100 mg total) by mouth 2 (two) times daily. Patient not taking: Reported on 11/21/2018 07/17/18   Ezequiel Essex, MD  ferrous sulfate 325 (65 FE) MG EC tablet Take 1 tablet (325 mg total)  by mouth 2 (two) times daily. Patient not taking: Reported on 02/04/2019 06/10/17   Ardath Sax, MD  HYDROcodone-acetaminophen (NORCO/VICODIN) 5-325 MG tablet Take 1 tablet by mouth every 4 (four) hours as needed. Patient not taking: Reported on 11/21/2018 08/02/18   Ezequiel Essex, MD    Physical Exam: Vitals:   02/04/19 2037 02/04/19 2100 02/04/19 2130 02/04/19 2200  BP: 119/69 (!) 118/52 (!) 158/68   Pulse: (!) 110 (!) 105 (!) 107   Resp: (!) 25 16 (!) 23   Temp: 100.3 F (37.9 C)   (!) 101.2 F (38.4 C)  TempSrc: Oral   Oral  SpO2: 100% 96% 95%   Weight:      Height:        Constitutional: Resting in the right lateral decubitus position, NAD, appears tired and shivering  eyes: PERRL, lids and conjunctivae normal ENMT: Mucous membranes are dry. Posterior pharynx clear of any exudate or lesions.Normal dentition.  Neck: normal, supple, no masses. Respiratory: clear to auscultation bilaterally, no wheezing, no crackles. Normal respiratory effort. No accessory muscle use.  Cardiovascular: Regular rate and rhythm, systolic murmur present. No extremity edema. 2+ pedal pulses. Abdomen: Mild generalized tenderness. No hepatosplenomegaly. Bowel sounds positive.  Musculoskeletal: no clubbing / cyanosis. No joint deformity upper and lower extremities. Good ROM, no contractures. Normal muscle tone.  Skin: no rashes, lesions, ulcers. No induration Neurologic: CN 2-12 grossly intact. Sensation intact, Strength 5/5 in all 4.   Psychiatric: Normal judgment and insight. Alert and oriented x 3. Normal mood.     Labs on Admission: I have personally reviewed following labs and imaging studies  CBC: Recent Labs  Lab 02/04/19 1736  WBC 15.8*  HGB 9.1*  HCT 28.7*  MCV 93.5  PLT 093   Basic Metabolic Panel: Recent Labs  Lab 02/04/19 1736  NA 140  K 4.4  CL 107  CO2 21*  GLUCOSE 140*  BUN 51*  CREATININE 2.25*  CALCIUM 9.6   GFR: Estimated Creatinine Clearance: 32.7 mL/min (A) (by C-G formula based on SCr of 2.25 mg/dL (H)). Liver Function Tests: Recent Labs  Lab 02/04/19 1736  AST 15  ALT 11  ALKPHOS 35*  BILITOT 0.5  PROT 6.2*  ALBUMIN 3.5   Recent Labs  Lab 02/04/19 1736  LIPASE 15   No results for input(s): AMMONIA in the last 168 hours. Coagulation Profile: Recent Labs  Lab 02/04/19 1736  INR 1.1   Cardiac Enzymes: No results for input(s): CKTOTAL, CKMB, CKMBINDEX, TROPONINI in the last 168 hours. BNP (last 3 results) No results for input(s): PROBNP in the last 8760 hours. HbA1C: No results for input(s): HGBA1C in the last 72 hours. CBG: No results for input(s): GLUCAP in the last 168 hours. Lipid Profile: No results for input(s): CHOL, HDL, LDLCALC, TRIG, CHOLHDL, LDLDIRECT in the last 72 hours. Thyroid Function Tests: No results for input(s): TSH, T4TOTAL, FREET4, T3FREE, THYROIDAB in the last 72 hours. Anemia Panel: No results for input(s): VITAMINB12, FOLATE, FERRITIN, TIBC, IRON, RETICCTPCT in the last 72 hours. Urine analysis:    Component Value Date/Time   COLORURINE YELLOW 10/26/2016 [place] 10/26/2016 1156   LABSPEC 1.011 10/26/2016 1156   PHURINE 6.0 10/26/2016 1156   GLUCOSEU NEGATIVE 10/26/2016 1156   HGBUR NEGATIVE 10/26/2016 [place] 10/26/2016 1156   [place] 10/26/2016 1156   PROTEINUR 30 (A) 10/26/2016 1156   UROBILINOGEN 1.0 10/01/2014 2034   NITRITE NEGATIVE 10/26/2016 1156   LEUKOCYTESUR  NEGATIVE 10/26/2016 1156    Radiological Exams on Admission: Ct Abdomen Pelvis Wo Contrast  Result Date: 02/04/2019 CLINICAL DATA:  Abdominal pain, prior renal transplant, vomiting and diarrhea EXAM: CT ABDOMEN AND PELVIS WITHOUT CONTRAST TECHNIQUE: Multidetector CT imaging of the abdomen and pelvis was performed following the standard protocol without IV contrast. COMPARISON:  December 28, 2015 FINDINGS: Lower chest: The visualized heart size within normal limits. Small pericardial effusion/thickening. A small hiatal hernia present. The visualized portions of the lungs are clear. Hepatobiliary: Although limited due to the lack of intravenous contrast, normal in appearance without gross focal abnormality. No evidence of calcified gallstones or biliary ductal dilatation. Pancreas:  Unremarkable.  No surrounding inflammatory changes. Spleen: Normal in size. Although limited due to the lack of intravenous contrast, normal in appearance. Adrenals/Urinary Tract: Both adrenal glands appear normal. There is atrophy of the bilateral native kidneys. A right lower pelvic kidney transplant is visualized. A posterior left bladder wall diverticulum is seen. Stomach/Bowel: The stomach, small bowel, and colon are normal in appearance. Scattered colonic diverticula are noted without diverticulitis. No inflammatory changes or obstructive findings. appendix is normal. Vascular/Lymphatic: There are no enlarged abdominal or pelvic lymph nodes. Dense aortic atherosclerosis is noted throughout. Reproductive: The prostate is unremarkable. Other: No evidence of abdominal wall mass or hernia. Musculoskeletal: No acute or significant osseous findings. IMPRESSION: no acute intra-abdominal or pelvic pathology. Electronically Signed   By: Prudencio Pair M.D.   On: 02/04/2019 19:55   Dg Chest Port 1 View  Result Date: 02/04/2019 CLINICAL DATA:  Hematemesis. Dark stools. Diarrhea.  Ex-smoker. EXAM: PORTABLE CHEST 1 VIEW COMPARISON:   09/21/2018 FINDINGS: Normal sized heart. Clear lungs. The lungs remain hyperexpanded with bilateral upper lobe bullous changes. Unremarkable bones. IMPRESSION: No acute abnormality. Stable changes of COPD. Electronically Signed   By: Claudie Revering M.D.   On: 02/04/2019 18:00    EKG: Independently reviewed. Sinus tachycardia, LAD, early R wave transition.  Rate is faster when compared to prior.  Assessment/Plan Principal Problem:   GI bleeding Active Problems:   Essential hypertension   Hyperlipidemia   Peripheral vascular disease, unspecified (HCC)   Neurogenic dysfunction of the urinary bladder   Deceased-donor kidney transplant recipient   AKI (acute kidney injury) ([place])  Scott Chavez is a 63 y.o. male with medical history significant for ESRD s/p DDRT in 2010 on immunosuppressants, PVD, hypertension, hyperlipidemia, GERD, chronic hepatitis C s/p treatment with Zepatier, neurogenic bladder, and ADHD who is admitted with GI bleeding.   GI bleeding: Patient with hematemesis and hematochezia plus reported melena.  History suggestive of gastroenteritis resulting in frequent emesis and possible Mallory-Weiss tearing.  Hemoglobin down to 9.1 from 14.1 in May. -Start IV Protonix 40 mg twice daily -Continue IV fluid resuscitation -Transfuse 1 unit PRBC, monitor H&H q6h overnight -Hold home aspirin and pharmacologic VTE prophylaxis -Continue antiemetics as needed  Acute kidney injury in setting of ESRD s/p DDRT in 2010: Due to hypovolemia/hypoperfusion from above.   -Continue IV fluid resuscitation and monitor labs -Continue home tacrolimus and mycophenolate  Hypertension: Holding home nifedipine and Toprol-XL in setting of hypotension/hypovolemia.  Peripheral vascular disease: S/p bilateral common iliac stents and bilateral common femoral to below-knee popliteal bypass. -Holding aspirin as above, continue statin  Hyperlipidemia: Continue rosuvastatin.  Neurogenic  bladder: Patient reportedly self catheterizes intermittently.   DVT prophylaxis: SCDs Code Status: Full code, confirmed with patient Family Communication: Attempted to call patient's daughter Conception Oms at [phone] without answer Disposition Plan: Pending clinical  progress Consults called: EDP to discuss with GI Admission status: Observation   Zada Finders MD [facility]  If 7PM-7AM, please contact night-coverage [email or web address]  02/04/2019, 10:37 PM
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
## 2019-02-04 NOTE — ED Notes (Signed)
Only able to obtain 1 set of cultures, this nurse notified EDP Bero,no new orders given

## 2019-02-05 ENCOUNTER — Encounter (HOSPITAL_COMMUNITY)
Admission: EM | Disposition: A | Discharge status: Home / Self Care | Payer: Self-pay | Source: Home / Self Care | Attending: Family Medicine

## 2019-02-05 ENCOUNTER — Observation Stay (HOSPITAL_COMMUNITY): Payer: Medicare Other | Admitting: Anesthesiology

## 2019-02-05 ENCOUNTER — Encounter (HOSPITAL_COMMUNITY): Payer: Self-pay | Admitting: *Deleted

## 2019-02-05 ENCOUNTER — Other Ambulatory Visit: Payer: Self-pay

## 2019-02-05 DIAGNOSIS — I10 Essential (primary) hypertension: Secondary | ICD-10-CM

## 2019-02-05 DIAGNOSIS — I959 Hypotension, unspecified: Secondary | ICD-10-CM | POA: Diagnosis present

## 2019-02-05 DIAGNOSIS — K921 Melena: Secondary | ICD-10-CM | POA: Diagnosis not present

## 2019-02-05 DIAGNOSIS — K922 Gastrointestinal hemorrhage, unspecified: Secondary | ICD-10-CM | POA: Diagnosis not present

## 2019-02-05 DIAGNOSIS — T8619 Other complication of kidney transplant: Secondary | ICD-10-CM | POA: Diagnosis present

## 2019-02-05 DIAGNOSIS — I739 Peripheral vascular disease, unspecified: Secondary | ICD-10-CM | POA: Diagnosis not present

## 2019-02-05 DIAGNOSIS — N319 Neuromuscular dysfunction of bladder, unspecified: Secondary | ICD-10-CM | POA: Diagnosis present

## 2019-02-05 DIAGNOSIS — H538 Other visual disturbances: Secondary | ICD-10-CM | POA: Diagnosis present

## 2019-02-05 DIAGNOSIS — K449 Diaphragmatic hernia without obstruction or gangrene: Secondary | ICD-10-CM | POA: Diagnosis not present

## 2019-02-05 DIAGNOSIS — E7849 Other hyperlipidemia: Secondary | ICD-10-CM

## 2019-02-05 DIAGNOSIS — F909 Attention-deficit hyperactivity disorder, unspecified type: Secondary | ICD-10-CM | POA: Diagnosis present

## 2019-02-05 DIAGNOSIS — K226 Gastro-esophageal laceration-hemorrhage syndrome: Secondary | ICD-10-CM | POA: Diagnosis not present

## 2019-02-05 DIAGNOSIS — Z20828 Contact with and (suspected) exposure to other viral communicable diseases: Secondary | ICD-10-CM | POA: Diagnosis present

## 2019-02-05 DIAGNOSIS — K920 Hematemesis: Secondary | ICD-10-CM | POA: Diagnosis present

## 2019-02-05 DIAGNOSIS — Y830 Surgical operation with transplant of whole organ as the cause of abnormal reaction of the patient, or of later complication, without mention of misadventure at the time of the procedure: Secondary | ICD-10-CM | POA: Diagnosis present

## 2019-02-05 DIAGNOSIS — K219 Gastro-esophageal reflux disease without esophagitis: Secondary | ICD-10-CM | POA: Diagnosis present

## 2019-02-05 DIAGNOSIS — D508 Other iron deficiency anemias: Secondary | ICD-10-CM | POA: Diagnosis not present

## 2019-02-05 DIAGNOSIS — I120 Hypertensive chronic kidney disease with stage 5 chronic kidney disease or end stage renal disease: Secondary | ICD-10-CM | POA: Diagnosis not present

## 2019-02-05 DIAGNOSIS — D84821 Immunodeficiency due to drugs: Secondary | ICD-10-CM | POA: Diagnosis present

## 2019-02-05 DIAGNOSIS — N186 End stage renal disease: Secondary | ICD-10-CM | POA: Diagnosis not present

## 2019-02-05 DIAGNOSIS — N179 Acute kidney failure, unspecified: Secondary | ICD-10-CM

## 2019-02-05 DIAGNOSIS — Z905 Acquired absence of kidney: Secondary | ICD-10-CM | POA: Diagnosis not present

## 2019-02-05 DIAGNOSIS — Z7982 Long term (current) use of aspirin: Secondary | ICD-10-CM | POA: Diagnosis not present

## 2019-02-05 DIAGNOSIS — F329 Major depressive disorder, single episode, unspecified: Secondary | ICD-10-CM | POA: Diagnosis present

## 2019-02-05 DIAGNOSIS — B182 Chronic viral hepatitis C: Secondary | ICD-10-CM | POA: Diagnosis present

## 2019-02-05 DIAGNOSIS — E785 Hyperlipidemia, unspecified: Secondary | ICD-10-CM | POA: Diagnosis present

## 2019-02-05 DIAGNOSIS — E861 Hypovolemia: Secondary | ICD-10-CM | POA: Diagnosis present

## 2019-02-05 DIAGNOSIS — D62 Acute posthemorrhagic anemia: Secondary | ICD-10-CM | POA: Diagnosis not present

## 2019-02-05 DIAGNOSIS — Z79899 Other long term (current) drug therapy: Secondary | ICD-10-CM | POA: Diagnosis not present

## 2019-02-05 DIAGNOSIS — E7800 Pure hypercholesterolemia, unspecified: Secondary | ICD-10-CM | POA: Diagnosis present

## 2019-02-05 DIAGNOSIS — K648 Other hemorrhoids: Secondary | ICD-10-CM | POA: Diagnosis present

## 2019-02-05 HISTORY — PX: ESOPHAGOGASTRODUODENOSCOPY (EGD) WITH PROPOFOL: SHX5813

## 2019-02-05 LAB — BASIC METABOLIC PANEL
Anion gap: 6 (ref 5–15)
BUN: 43 mg/dL — ABNORMAL HIGH (ref 8–23)
CO2: 23 mmol/L (ref 22–32)
Calcium: 8.7 mg/dL — ABNORMAL LOW (ref 8.9–10.3)
Chloride: 113 mmol/L — ABNORMAL HIGH (ref 98–111)
Creatinine, Ser: 1.56 mg/dL — ABNORMAL HIGH (ref 0.61–1.24)
GFR calc Af Amer: 54 mL/min — ABNORMAL LOW (ref >60)
GFR calc non Af Amer: 47 mL/min — ABNORMAL LOW (ref >60)
Glucose, Bld: 108 mg/dL — ABNORMAL HIGH (ref 70–99)
Potassium: 4 mmol/L (ref 3.5–5.1)
Sodium: 142 mmol/L (ref 135–145)

## 2019-02-05 LAB — SARS CORONAVIRUS 2 (TAT 6-24 HRS): SARS Coronavirus 2: NEGATIVE

## 2019-02-05 LAB — URINALYSIS, ROUTINE W REFLEX MICROSCOPIC
Bilirubin Urine: NEGATIVE
Glucose, UA: NEGATIVE mg/dL
Ketones, ur: NEGATIVE mg/dL
Nitrite: NEGATIVE
Protein, ur: 30 mg/dL — AB
Specific Gravity, Urine: 1.021 (ref 1.005–1.030)
WBC, UA: >50 WBC/hpf — ABNORMAL HIGH (ref 0–5)
pH: 5 (ref 5.0–8.0)

## 2019-02-05 LAB — CBC
HCT: 26 % — ABNORMAL LOW (ref 39.0–52.0)
Hemoglobin: 8.2 g/dL — ABNORMAL LOW (ref 13.0–17.0)
MCH: 29.4 pg (ref 26.0–34.0)
MCHC: 31.5 g/dL (ref 30.0–36.0)
MCV: 93.2 fL (ref 80.0–100.0)
Platelets: 124 10*3/uL — ABNORMAL LOW (ref 150–400)
RBC: 2.79 MIL/uL — ABNORMAL LOW (ref 4.22–5.81)
RDW: 13.9 % (ref 11.5–15.5)
WBC: 9.6 10*3/uL (ref 4.0–10.5)
nRBC: 0 % (ref 0.0–0.2)

## 2019-02-05 LAB — HEMOGLOBIN AND HEMATOCRIT, BLOOD
HCT: 25.2 % — ABNORMAL LOW (ref 39.0–52.0)
Hemoglobin: 7.8 g/dL — ABNORMAL LOW (ref 13.0–17.0)

## 2019-02-05 LAB — HIV ANTIBODY (ROUTINE TESTING W REFLEX): HIV Screen 4th Generation wRfx: NONREACTIVE

## 2019-02-05 SURGERY — ESOPHAGOGASTRODUODENOSCOPY (EGD) WITH PROPOFOL
Anesthesia: Monitor Anesthesia Care

## 2019-02-05 MED ORDER — LISDEXAMFETAMINE DIMESYLATE 20 MG PO CAPS
20.0000 mg | ORAL_CAPSULE | Freq: Two times a day (BID) | ORAL | Status: DC
  Administered 2019-02-05 – 2019-02-07 (×5): 20 mg via ORAL
  Filled 2019-02-05 (×5): qty 1

## 2019-02-05 MED ORDER — MYCOPHENOLATE MOFETIL 250 MG PO CAPS
500.0000 mg | ORAL_CAPSULE | Freq: Every day | ORAL | Status: DC
  Administered 2019-02-06 – 2019-02-07 (×2): 500 mg via ORAL
  Filled 2019-02-05 (×3): qty 2

## 2019-02-05 MED ORDER — PROPOFOL 10 MG/ML IV BOLUS
INTRAVENOUS | Status: AC
  Filled 2019-02-05: qty 20

## 2019-02-05 MED ORDER — METOPROLOL SUCCINATE ER 25 MG PO TB24
25.0000 mg | ORAL_TABLET | Freq: Two times a day (BID) | ORAL | Status: DC
  Administered 2019-02-05 – 2019-02-07 (×5): 25 mg via ORAL
  Filled 2019-02-05 (×5): qty 1

## 2019-02-05 MED ORDER — ONDANSETRON HCL 4 MG/2ML IJ SOLN
INTRAMUSCULAR | Status: DC | PRN
  Administered 2019-02-05: 4 mg via INTRAVENOUS

## 2019-02-05 MED ORDER — VITAMIN B-12 100 MCG PO TABS
100.0000 ug | ORAL_TABLET | Freq: Every day | ORAL | Status: DC
  Administered 2019-02-05 – 2019-02-07 (×3): 100 ug via ORAL
  Filled 2019-02-05 (×3): qty 1

## 2019-02-05 MED ORDER — PROPOFOL 10 MG/ML IV BOLUS
INTRAVENOUS | Status: DC | PRN
  Administered 2019-02-05 (×6): 20 mg via INTRAVENOUS

## 2019-02-05 MED ORDER — CITALOPRAM HYDROBROMIDE 20 MG PO TABS
20.0000 mg | ORAL_TABLET | Freq: Two times a day (BID) | ORAL | Status: DC
  Administered 2019-02-05 – 2019-02-07 (×5): 20 mg via ORAL
  Filled 2019-02-05 (×5): qty 1

## 2019-02-05 MED ORDER — PROPOFOL 500 MG/50ML IV EMUL
INTRAVENOUS | Status: DC | PRN
  Administered 2019-02-05: 100 ug/kg/min via INTRAVENOUS

## 2019-02-05 MED ORDER — ALBUTEROL SULFATE (2.5 MG/3ML) 0.083% IN NEBU: 2.5000 mg | INHALATION_SOLUTION | Freq: Four times a day (QID) | RESPIRATORY_TRACT | Status: DC | PRN

## 2019-02-05 MED ORDER — ZOLPIDEM TARTRATE 5 MG PO TABS
5.0000 mg | ORAL_TABLET | Freq: Every day | ORAL | Status: DC
  Administered 2019-02-05 – 2019-02-06 (×2): 5 mg via ORAL
  Filled 2019-02-05 (×2): qty 1

## 2019-02-05 MED ORDER — LIDOCAINE 2% (20 MG/ML) 5 ML SYRINGE
INTRAMUSCULAR | Status: DC | PRN
  Administered 2019-02-05: 100 mg via INTRAVENOUS

## 2019-02-05 MED ORDER — PREGABALIN 50 MG PO CAPS
50.0000 mg | ORAL_CAPSULE | Freq: Three times a day (TID) | ORAL | Status: DC
  Administered 2019-02-05 – 2019-02-07 (×7): 50 mg via ORAL
  Filled 2019-02-05 (×7): qty 1

## 2019-02-05 MED ORDER — SODIUM CHLORIDE 0.9 % IV SOLN
INTRAVENOUS | Status: DC
  Administered 2019-02-05: 12:00:00 500 mL via INTRAVENOUS
  Administered 2019-02-06: 01:00:00 via INTRAVENOUS

## 2019-02-05 MED ORDER — MYCOPHENOLATE MOFETIL 250 MG PO CAPS
250.0000 mg | ORAL_CAPSULE | Freq: Every day | ORAL | Status: DC
  Administered 2019-02-05 – 2019-02-06 (×2): 250 mg via ORAL
  Filled 2019-02-05 (×4): qty 1

## 2019-02-05 SURGICAL SUPPLY — 15 items

## 2019-02-05 NOTE — Op Note (Signed)
[facility] Patient Name: Scott Chavez Procedure Date: 02/05/2019 MRN: [id number] Attending MD: Clarene Essex , MD Date of Birth: 17-Jul-1955 CSN: [id number] Age: 63 Admit Type: Inpatient Procedure:                Upper GI endoscopy Indications:              Acute post hemorrhagic anemia, Hematemesis, Melena Providers:                Clarene Essex, MD, Cleda Daub, RN, Lazaro Arms,                            Technician, Marguerita Merles, Technician Referring MD:              Medicines:                Monitored Anesthesia Care, Propofol total dose 200                            mg IV, 2 mg IV lidocaine Complications:            No immediate complications. Estimated Blood Loss:     Estimated blood loss: none. Procedure:                Pre-Anesthesia Assessment:                           - Prior to the procedure, a History and Physical                            was performed, and patient medications and                            allergies were reviewed. The patient's tolerance of                            previous anesthesia was also reviewed. The risks                            and benefits of the procedure and the sedation                            options and risks were discussed with the patient.                            All questions were answered, and informed consent                            was obtained. Prior Anticoagulants: The patient has                            taken no previous anticoagulant or antiplatelet                            agents except for aspirin. ASA Grade Assessment:  III - A patient with severe systemic disease. After                            reviewing the risks and benefits, the patient was                            deemed in satisfactory condition to undergo the                            procedure.                           After obtaining informed consent, the endoscope was   passed under direct vision. Throughout the                            procedure, the patient's blood pressure, pulse, and                            oxygen saturations were monitored continuously. The                            GIF-[id number] ([id number] Olympus gastroscope was                            introduced through the mouth, and advanced to the                            third part of duodenum. The upper GI endoscopy was                            accomplished without difficulty. The patient                            tolerated the procedure well. Scope In: Scope Out: Findings:      The larynx was normal.      A small hiatal hernia was present.      2 medium non-bleeding Mallory-Weiss tears with no stigmata of recent       bleeding was found.      The entire examined stomach was normal. Although he did not hold air       well no obvious abnormality was seen      The duodenal bulb, first portion of the duodenum, second portion of the       duodenum and third portion of the duodenum were normal.      The exam was otherwise without abnormality. Impression:               - Normal larynx.                           - Small hiatal hernia.                           - 2 Mallory-Weiss tears.                           -  Normal stomach.                           - Normal duodenal bulb, first portion of the                            duodenum, second portion of the duodenum and third                            portion of the duodenum.                           - The examination was otherwise normal.                           - No specimens collected. Moderate Sedation:      Not Applicable - Patient had care per Anesthesia. Recommendation:           - Clear liquid diet today.                           - Continue present medications.                           - No aspirin, ibuprofen, naproxen, or other                            non-steroidal anti-inflammatory drugs for 2 weeks.                            - Return to GI clinic PRN.                           - Telephone GI clinic if symptomatic PRN. Procedure Code(s):        --- Professional ---                           [phone], Esophagogastroduodenoscopy, flexible,                            transoral; diagnostic, including collection of                            specimen(s) by brushing or washing, when performed                            (separate procedure) Diagnosis Code(s):        --- Professional ---                           K44.9, Diaphragmatic hernia without obstruction or                            gangrene                           K22.6, Gastro-esophageal laceration-hemorrhage  syndrome                           D62, Acute posthemorrhagic anemia                           K92.0, Hematemesis                           K92.1, Melena (includes Hematochezia) CPT copyright 2019 [employer]. All rights reserved. The codes documented in this report are preliminary and upon coder review may  be revised to meet current compliance requirements. Clarene Essex, MD 02/05/2019 1:07:09 PM This report has been signed electronically. Number of Addenda: 0

## 2019-02-05 NOTE — Consult Note (Signed)
Reason for Consult: GI bleed Referring Physician: Hospital team  Scott Chavez is an 63 y.o. male.  HPI: Patient was in his normal state of health without any GI complaints but he has been having increased headaches lately and has been on both aspirin and Naprosyn and did have some increased abdominal pain nausea and vomiting with some maroon vomit as well as some dark and maroon stools and was found to have a decreased hemoglobin and increased BUN and creatinine and we are consulted for further work-up and plans he has no sick contacts and no previous problems with his GI work-up which we discussed and his office computer chart and hospital computer chart reviewed and his case discussed with the hospital team as well  Past Medical History:  Diagnosis Date  . ADHD   . Anemia    pt. not sure, but reports he is taking Fe for one month now.  . Aorto-iliac disease ([place])   . Bladder dysfunction   . ESRD (end stage renal disease) [facility])    [facility]- [facility] , followed by Dr. Moshe Cipro   . GERD (gastroesophageal reflux disease)   . Hemodialysis patient [facility])    "on dialysis 1998-2010" 04/25/2013)  . Hepatitis C    has been treated with Harvoni  . History of renal failure   . Hypercholesteremia   . Hypertension   . Internal hemorrhoids    Colonoscopy 2010 and anoscopy 2015  . Peripheral vascular disease ([place])   . Pneumonia   . Self-catheterizes urinary bladder    pt. choice, but he reports that he desires to do this to avoid retention     Past Surgical History:  Procedure Laterality Date  . ABDOMINAL AORTAGRAM N/A 03/09/2013   Procedure: ABDOMINAL Maxcine Ham;  Surgeon: Elam Dutch, MD;  Location: [facility] CATH LAB;  Service: Cardiovascular;  Laterality: N/A;  . AV FISTULA PLACEMENT Left 1998   "removed 2011" (04/25/2013)  . AV FISTULA REPAIR Left    removal with partial vein removed  . COLONOSCOPY    . ENDARTERECTOMY FEMORAL Right 04/17/2015   Procedure:  ENDARTERECTOMY RIGHT ILIOFEMORAL WITH PROFUNDOPLASTY;  Surgeon: Conrad Ventura, MD;  Location: [place];  Service: Vascular;  Laterality: Right;  . ENDARTERECTOMY FEMORAL Right 10/25/2016   Procedure: RIGHT ILIOFEMORAL ENDARTERECTOMY WITH BOVINE PERICARDIAL PATCH ANGIOPLASTY;  Surgeon: Conrad Brownsdale, MD;  Location: [place];  Service: Vascular;  Laterality: Right;  . FEMORAL-POPLITEAL BYPASS GRAFT Left 04/24/2013   Procedure: BYPASS GRAFT COMMON FEMORALARTERY-BELOW KNEE POPLITEAL ARTERY WITH GREATER SAPHENOUS VEIN;  Surgeon: Conrad Monson, MD;  Location: [place];  Service: Vascular;  Laterality: Left;  . FEMORAL-POPLITEAL BYPASS GRAFT Right 04/17/2015   Procedure: BYPASS GRAFT RIGHT FEMORALTO BELOW KNEE POPLITEAL ARTERY USING RIGHT NON REVERSED GREATER SAPPHENOUS VEIN;  Surgeon: Conrad Los Prados, MD;  Location: [place];  Service: Vascular;  Laterality: Right;  . INTRAOPERATIVE ARTERIOGRAM Left 04/24/2013   Procedure: INTRA OPERATIVE ARTERIOGRAM;  Surgeon: Conrad Lenoir, MD;  Location: [place];  Service: Vascular;  Laterality: Left;  . KIDNEY TRANSPLANT    . LOWER EXTREMITY ANGIOGRAM Bilateral 03/09/2013   Procedure: LOWER EXTREMITY ANGIOGRAM;  Surgeon: Elam Dutch, MD;  Location[facility] C[facility] LAB;  Service: Cardiovascular;  Laterality: Bilateral;  . LOWER EXTREMITY ANGIOGRAM Right 10/25/2013   Procedure: LOWER EXTREMITY ANGIOGRAM;  Surgeon: Conrad Prescott, MD;  Location: [facility] CATH LAB;  Service: Cardiovascular;  Laterality: Right;  . LOWER EXTREMITY ANGIOGRAM Right 12/13/2013   Procedure: LOWER EXTREMITY ANGIOGRAM;  Surgeon: Conrad Lima, MD;  Location: [facility] CATH LAB;  Service: Cardiovascular;  Laterality: Right;  . NEPHRECTOMY RECIPIENT  2010  . NEPHRECTOMY TRANSPLANTED ORGAN    . PATCH ANGIOPLASTY Right 04/17/2015   Procedure: Rueben Bash PATCH ANGIOPLASTY, RIGHT ILEOFEMORAL;  Surgeon: Conrad The Acreage, MD;  Location: [place];  Service: Vascular;  Laterality: Right;  . PERIPHERAL VASCULAR CATHETERIZATION N/A 04/10/2015   Procedure:  Abdominal Aortogram;  Surgeon: Conrad Bellechester, MD;  Location: [place] CV LAB;  Service: Cardiovascular;  Laterality: N/A;  . PERIPHERAL VASCULAR CATHETERIZATION N/A 05/10/2016   Procedure: Abdominal Aortogram w/Lower Extremity;  Surgeon: Conrad Magoffin, MD;  Location: [place] CV LAB;  Service: Cardiovascular;  Laterality: N/A;  . s/p cadaveric renal transplant  December 2010   [place] Left 09/24/2015   Procedure: EXCISION OF THROMBOSED RADIOCEPHALIC ARTERIOVENOUS FISTULA;  Surgeon: Conrad Jeffersonville, MD;  Location: [place];  Service: Vascular;  Laterality: Left;  Marland Kitchen VEIN HARVEST Right 04/17/2015   Procedure: RIGHT GREATER [place] ;  Surgeon: Conrad Exeter, MD;  Location: [place];  Service: Vascular;  Laterality: Right;    Family History  Problem Relation Age of Onset  . Hypertension Father   . Other Father        amputation  . Hypertension Mother     Social History:  reports that he quit smoking about 15 years ago. His smoking use included e-cigarettes. He has a 17.00 pack-year smoking history. He has quit using smokeless tobacco. He reports that he does not drink alcohol or use drugs.  Allergies:  Allergies  Allergen Reactions  . Tape Rash and Other (See Comments)    NO Silk tape, please!!    Medications: I have reviewed the patient's current medications.  Results for orders placed or performed during the hospital encounter of 02/04/19 (from the past 48 hour(s))  Culture, blood (Routine X 2) w Reflex to ID Panel     Status: None (Preliminary result)   Collection Time: 02/04/19  5:36 PM   Specimen: BLOOD RIGHT ARM  Result Value Ref Range   Specimen Description      BLOOD RIGHT ARM Performed at [facility] [address] [place], [place] [address]    Special Requests      BOTTLES DRAWN AEROBIC AND ANAEROBIC Blood Culture adequate volume Performed at [facility] [address] [place], [place] [address]     Culture      NO GROWTH < 12 HOURS Performed at [facility] [address] [place], [place] [address]    Report Status PENDING   Lactic acid, plasma     Status: Abnormal   Collection Time: 02/04/19  5:36 PM  Result Value Ref Range   Lactic Acid, Venous 3.4 (HH) 0.5 - 1.9 mmol/L    Comment: CRITICAL RESULT CALLED TO, READ BACK BY AND VERIFIED WITH: K.ZULETA,RN [id number] @1932  BY V.WILKINS Performed at [facility] [address], [place], [place] [address]   CBC     Status: Abnormal   Collection Time: 02/04/19  5:36 PM  Result Value Ref Range   WBC 15.8 (H) 4.0 - 10.5 K/uL   RBC 3.07 (L) 4.22 - 5.81 MIL/uL   Hemoglobin 9.1 (L) 13.0 - 17.0 g/dL   HCT 28.7 (L) 39.0 - 52.0 %   MCV 93.5 80.0 - 100.0 fL   MCH 29.6 26.0 - 34.0 pg   MCHC 31.7  30.0 - 36.0 g/dL   RDW 14.2 11.5 - 15.5 %   Platelets 200 150 - 400 K/uL   nRBC 0.0 0.0 - 0.2 %    Comment: Performed at [facility], [place] [address] [place], [place] [address]  Comprehensive metabolic panel     Status: Abnormal   Collection Time: 02/04/19  5:36 PM  Result Value Ref Range   Sodium 140 135 - 145 mmol/L   Potassium 4.4 3.5 - 5.1 mmol/L   Chloride 107 98 - 111 mmol/L   CO2 21 (L) 22 - 32 mmol/L   Glucose, Bld 140 (H) 70 - 99 mg/dL   BUN 51 (H) 8 - 23 mg/dL   Creatinine, Ser 2.25 (H) 0.61 - 1.24 mg/dL   Calcium 9.6 8.9 - 10.3 mg/dL   Total Protein 6.2 (L) 6.5 - 8.1 g/dL   Albumin 3.5 3.5 - 5.0 g/dL   AST 15 15 - 41 U/L   ALT 11 0 - 44 U/L   Alkaline Phosphatase 35 (L) 38 - 126 U/L   Total Bilirubin 0.5 0.3 - 1.2 mg/dL   GFR calc non Af Amer 30 (L) >60 mL/min   GFR calc Af Amer 35 (L) >60 mL/min   Anion gap 12 5 - 15    Comment: Performed at [facility], [place] [address], [place], [place] [address]  Lipase, blood     Status: None   Collection Time: 02/04/19  5:36 PM  Result Value Ref Range   Lipase 15 11 - 51 U/L    Comment: Performed at [facility], [place] [address], [place], [place] [address]  Troponin I (High Sensitivity)     Status: Abnormal   Collection Time: 02/04/19  5:36 PM  Result Value Ref Range   Troponin I (High Sensitivity) 18 (H) <18 ng/L    Comment: (NOTE) Elevated high sensitivity troponin I (hsTnI) values and significant  changes across serial measurements may suggest ACS but many other  chronic and acute conditions are known to elevate hsTnI results.  Refer to the "Links" section for chest pain algorithms and additional  guidance. Performed at [facility], [place] [address] [place], [place] [address]   Protime-INR     Status: None   Collection Time: 02/04/19  5:36 PM  Result Value Ref Range   Prothrombin Time 13.6 11.4 - 15.2 seconds   INR 1.1 0.8 - 1.2    Comment: (NOTE) INR goal varies based on device and disease states. Performed at [facility], [place] [address] [place], [place] [address]   APTT     Status: None   Collection Time: 02/04/19  5:36 PM  Result Value Ref Range   aPTT 24 24 - 36 seconds    Comment: Performed at [facility], [place] [address] [place], [place] [address]  Type and screen [place]     Status: None (Preliminary result)   Collection Time: 02/04/19  5:36 PM  Result Value Ref Range   ABO/RH(D) O POS    Antibody Screen NEG    Sample Expiration 02/07/2019,2359    Unit Number [id number]    Blood Component Type RED CELLS,LR    Unit division 00    Status of Unit ISSUED,FINAL    Transfusion Status OK TO TRANSFUSE    Crossmatch Result      Compatible Performed at [facility], [place] [address] [place], [place] [address]  Unit Number [id number]    Blood Component Type RED CELLS,LR    Unit division 00    Status of Unit ALLOCATED    Transfusion Status OK TO TRANSFUSE    Crossmatch Result Compatible   POC occult blood, ED     Status: Abnormal   Collection Time:  02/04/19  5:52 PM  Result Value Ref Range   Fecal Occult Bld POSITIVE (A) NEGATIVE  ABO/Rh     Status: None   Collection Time: 02/04/19  7:24 PM  Result Value Ref Range   ABO/RH(D)      O POS Performed at [facility], [place] [address], [place], [place] [address]   Troponin I (High Sensitivity)     Status: Abnormal   Collection Time: 02/04/19  8:22 PM  Result Value Ref Range   Troponin I (High Sensitivity) 28 (H) <18 ng/L    Comment: (NOTE) Elevated high sensitivity troponin I (hsTnI) values and significant  changes across serial measurements may suggest ACS but many other  chronic and acute conditions are known to elevate hsTnI results.  Refer to the "Links" section for chest pain algorithms and additional  guidance. Performed at [facility], [place] [address] [place], [place] [address]   Lactic acid, plasma     Status: None   Collection Time: 02/04/19  8:22 PM  Result Value Ref Range   Lactic Acid, Venous 1.5 0.5 - 1.9 mmol/L    Comment: Performed at [facility], [place] [address] [place], [place] [address]  SARS CORONAVIRUS 2 (TAT 6-24 HRS) Nasopharyngeal Nasopharyngeal Swab     Status: None   Collection Time: 02/04/19  8:22 PM   Specimen: Nasopharyngeal Swab  Result Value Ref Range   SARS Coronavirus 2 NEGATIVE NEGATIVE    Comment: (NOTE) SARS-CoV-2 target nucleic acids are NOT DETECTED. The SARS-CoV-2 RNA is generally detectable in upper and lower respiratory specimens during the acute phase of infection. Negative results do not preclude SARS-CoV-2 infection, do not rule out co-infections with other pathogens, and should not be used as the sole basis for treatment or other patient management decisions. Negative results must be combined with clinical observations, patient history, and epidemiological information. The expected result is Negative. Fact Sheet for Patients: SugarRoll.be Fact  Sheet for Healthcare Providers: https://www.woods-mathews.com/ This test is not yet approved or cleared by the [place] FDA and  has been authorized for detection and/or diagnosis of SARS-CoV-2 by FDA under an Emergency Use Authorization (EUA). This EUA will remain  in effect (meaning this test can be used) for the duration of the COVID-19 declaration under Section 56 4(b)(1) of the Act, 21 U.S.C. section 360bbb-3(b)(1), unless the authorization is terminated or revoked sooner. Performed at [facility], [place] [address] [place], [place] [address]   MRSA PCR Screening     Status: None   Collection Time: 02/04/19 10:00 PM   Specimen: Nasopharyngeal  Result Value Ref Range   MRSA by PCR NEGATIVE NEGATIVE    Comment:        The GeneXpert MRSA Assay (FDA approved for NASAL specimens only), is one component of a comprehensive MRSA colonization surveillance program. It is not intended to diagnose MRSA infection nor to guide or monitor treatment for MRSA infections. Performed at [facility], [place] [address], [place], [place] [address]   Sodium, urine, random     Status: None   Collection Time: 02/04/19 10:54 PM  Result Value Ref Range  Sodium, Ur 46 mmol/L    Comment: Performed at [facility], [place] [address], [place], [place] [address]  Creatinine, urine, random     Status: None   Collection Time: 02/04/19 10:54 PM  Result Value Ref Range   Creatinine, Urine 321.61 mg/dL    Comment: Performed at [facility], [place] [address], [place], [place] [address]  Culture, blood (Routine X 2) w Reflex to ID Panel     Status: None (Preliminary result)   Collection Time: 02/04/19 10:56 PM   Specimen: BLOOD  Result Value Ref Range   Specimen Description      BLOOD RIGHT ANTECUBITAL Performed at [facility], [place] [address] [place], [place] [address]    Special Requests      BOTTLES DRAWN  AEROBIC ONLY Blood Culture results may not be optimal due to an excessive volume of blood received in culture bottles Performed at [facility] [address] [place], [place] [address]    Culture      NO GROWTH < 12 HOURS Performed at [facility] [address], [place], [place] [address]    Report Status PENDING   HIV Antibody (routine testing w rflx)     Status: None   Collection Time: 02/04/19 10:56 PM  Result Value Ref Range   HIV Screen 4th Generation wRfx NON REACTIVE NON REACTIVE    Comment: Performed at [facility], [place] [address], [place], [place] [address]  Hemoglobin and hematocrit, blood     Status: Abnormal   Collection Time: 02/04/19 10:56 PM  Result Value Ref Range   Hemoglobin 7.5 (L) 13.0 - 17.0 g/dL   HCT 24.2 (L) 39.0 - 52.0 %    Comment: Performed at [facility], [place] [address] [place], [place] [address]  Prepare RBC     Status: None   Collection Time: 02/04/19 10:56 PM  Result Value Ref Range   Order Confirmation      ORDER PROCESSED BY BLOOD BANK Performed at [facility] [address] [place], [place] [address]   Urinalysis, Routine w reflex microscopic     Status: Abnormal   Collection Time: 02/04/19 11:19 PM  Result Value Ref Range   Color, Urine YELLOW YELLOW   APPearance CLOUDY (A) CLEAR   Specific Gravity, Urine 1.021 1.005 - 1.030   pH 5.0 5.0 - 8.0   Glucose, UA NEGATIVE NEGATIVE mg/dL   Hgb urine dipstick LARGE (A) NEGATIVE   Bilirubin Urine NEGATIVE NEGATIVE   Ketones, ur NEGATIVE NEGATIVE mg/dL   Protein, ur 30 (A) NEGATIVE mg/dL   Nitrite NEGATIVE NEGATIVE   Leukocytes,Ua LARGE (A) NEGATIVE   RBC / HPF 11-20 0 - 5 RBC/hpf   WBC, UA >50 (H) 0 - 5 WBC/hpf   Bacteria, UA FEW (A) NONE SEEN   Squamous Epithelial / LPF 0-5 0 - 5   WBC Clumps PRESENT    Mucus PRESENT    Hyaline Casts, UA PRESENT    Non Squamous Epithelial 0-5 (A) NONE SEEN    Comment: Performed at  [facility], [place] [address], [place],  [address]  Basic metabolic panel     Status: Abnormal   Collection Time: 02/05/19  5:36 AM  Result Value Ref Range   Sodium 142 135 - 145 mmol/L   Potassium 4.0 3.5 - 5.1 mmol/L   Chloride 113 (H) 98 - 111 mmol/L   CO2 23  22 - 32 mmol/L   Glucose, Bld 108 (H) 70 - 99 mg/dL   BUN 43 (H) 8 - 23 mg/dL   Creatinine, Ser 1.56 (H) 0.61 - 1.24 mg/dL   Calcium 8.7 (L) 8.9 - 10.3 mg/dL   GFR calc non Af Amer 47 (L) >60 mL/min   GFR calc Af Amer 54 (L) >60 mL/min   Anion gap 6 5 - 15    Comment: Performed at [facility], [place] [address] [place], [place] [address]  CBC     Status: Abnormal   Collection Time: 02/05/19  5:36 AM  Result Value Ref Range   WBC 9.6 4.0 - 10.5 K/uL   RBC 2.79 (L) 4.22 - 5.81 MIL/uL   Hemoglobin 8.2 (L) 13.0 - 17.0 g/dL   HCT 26.0 (L) 39.0 - 52.0 %   MCV 93.2 80.0 - 100.0 fL   MCH 29.4 26.0 - 34.0 pg   MCHC 31.5 30.0 - 36.0 g/dL   RDW 13.9 11.5 - 15.5 %   Platelets 124 (L) 150 - 400 K/uL   nRBC 0.0 0.0 - 0.2 %    Comment: Performed at [facility], [place] [address], [place], [place] [address]  Hemoglobin and hematocrit, blood     Status: Abnormal   Collection Time: 02/05/19 11:18 AM  Result Value Ref Range   Hemoglobin 7.8 (L) 13.0 - 17.0 g/dL   HCT 25.2 (L) 39.0 - 52.0 %    Comment: Performed at [facility], [place] [address] [place], [place] [address]    Ct Abdomen Pelvis Wo Contrast  Result Date: 02/04/2019 CLINICAL DATA:  Abdominal pain, prior renal transplant, vomiting and diarrhea EXAM: CT ABDOMEN AND PELVIS WITHOUT CONTRAST TECHNIQUE: Multidetector CT imaging of the abdomen and pelvis was performed following the standard protocol without IV contrast. COMPARISON:  December 28, 2015 FINDINGS: Lower chest: The visualized heart size within normal limits. Small pericardial effusion/thickening. A small hiatal hernia present. The visualized  portions of the lungs are clear. Hepatobiliary: Although limited due to the lack of intravenous contrast, normal in appearance without gross focal abnormality. No evidence of calcified gallstones or biliary ductal dilatation. Pancreas:  Unremarkable.  No surrounding inflammatory changes. Spleen: Normal in size. Although limited due to the lack of intravenous contrast, normal in appearance. Adrenals/Urinary Tract: Both adrenal glands appear normal. There is atrophy of the bilateral native kidneys. A right lower pelvic kidney transplant is visualized. A posterior left bladder wall diverticulum is seen. Stomach/Bowel: The stomach, small bowel, and colon are normal in appearance. Scattered colonic diverticula are noted without diverticulitis. No inflammatory changes or obstructive findings. appendix is normal. Vascular/Lymphatic: There are no enlarged abdominal or pelvic lymph nodes. Dense aortic atherosclerosis is noted throughout. Reproductive: The prostate is unremarkable. Other: No evidence of abdominal wall mass or hernia. Musculoskeletal: No acute or significant osseous findings. IMPRESSION: no acute intra-abdominal or pelvic pathology. Electronically Signed   By: Prudencio Pair M.D.   On: 02/04/2019 19:55   Dg Chest Port 1 View  Result Date: 02/04/2019 CLINICAL DATA:  Hematemesis. Dark stools. Diarrhea.  Ex-smoker. EXAM: PORTABLE CHEST 1 VIEW COMPARISON:  09/21/2018 FINDINGS: Normal sized heart. Clear lungs. The lungs remain hyperexpanded with bilateral upper lobe bullous changes. Unremarkable bones. IMPRESSION: No acute abnormality. Stable changes of COPD. Electronically Signed   By: Claudie Revering M.D.   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 rate (!) 22, height 5\' 10"  (1.778 m), weight 66.9 kg, SpO2 100 %. Physical Exam vital signs stable afebrile no acute distress slightly lethargic  exam please see preassessment evaluation abdomen is pertinent for mild tenderness throughout without guarding or rebound occasional bowel sounds labs and x-rays reviewed including negative CT  Assessment/Plan: Upper GI bleed and patient on aspirin and nonsteroidals Plan: We discussed repeat endoscopy and will proceed today with further work-up and plans pending those findings  Khyren Hing E 02/05/2019, 12:30 PM

## 2019-02-05 NOTE — Transfer of Care (Signed)
Immediate Anesthesia Transfer of Care Note  Patient: Scott Chavez  Procedure(s) Performed: ESOPHAGOGASTRODUODENOSCOPY (EGD) WITH PROPOFOL (N/A )  Patient Location: Endoscopy Unit  Anesthesia Type:MAC  Level of Consciousness: drowsy  Airway & Oxygen Therapy: Patient Spontanous Breathing and Patient connected to face mask oxygen  Post-op Assessment: Report given to RN and Post -op Vital signs reviewed and stable  Post vital signs: Reviewed and stable  Last Vitals:  Vitals Value Taken Time  BP    Temp    Pulse    Resp    SpO2      Last Pain:  Vitals:   02/05/19 1209  TempSrc: Oral  PainSc: 0-No pain         Complications: No apparent anesthesia complications

## 2019-02-05 NOTE — Progress Notes (Addendum)
PROGRESS NOTE    Scott Chavez  HFW:[id number] DOB: [birth year] DOA: 02/04/2019 PCP: Benito Mccreedy, MD   Brief Narrative: As per H@P : 63 y.o. male with medical history significant for ESRD s/p DDRT in 2010 on immunosuppressants, PVD, hypertension, hyperlipidemia, GERD, chronic hepatitis C s/p treatment with Zepatier, neurogenic bladder, and ADHD who presents to the ED with complaint of nausea vomiting abdominal pain and diarrhea started 10/3 morning after having dinner at [employer]  On 9/2.  Initially emesis with food later red blood.  Since then not being able to maintain oral intake including his medication.  On aspirin 81 but no NSAIDs, no EtOH.   In the ER, BP 115/54, pulse 108, RR 27, temp 97.8 Fahrenheit, SPO2 100% on room air.  Labs notable for WBC 15.8, hemoglobin 9.1 (14.1 on 09/27/2018), platelets 200,000, BUN 51, creatinine 2.25 (baseline creatinine 1.1-1.2), sodium 140, potassium 4.4, serum glucose 140, lipase 15, lactic acid 3.4, PT 13.6, INR 1.1, PTT 24, high-sensitivity troponin I 18.  FOBT is positive. SARS-CoV-2 test was collected and pending.  Portable chest x-ray showed hyperexpanded lung fields without acute cardiopulmonary process. CT abdomen/pelvis without contrast was negative for acute intra-abdominal or pelvic pathology. S/p 2 L normal saline and fentanyl for pain. GI consulted and admitted COVID-19 negative.  Subjective: Seen/examined, Some abd pain, no vomitting but had maroon colored stool overnight No chest pain,fever.  Assessment & Plan:   Nausea vomiting diarrhea, with hematemesis and hematochezia/melena: Suggestive of gastroenteritis and possible GI bleed with Mallory-Weiss tearing.  Last colonoscopy in 2010.  I notified [facility] this morning as patient is known to them.  There was concern for cirrhosis and GI was referred in 2017 unclear whether he had procedure or not. Keep n.p.o. IV fluids PPI twice daily avoid  aspirin, monitor serial H&H.  Patient had maroon-colored stool overnight per nursing. Notified by [facility] PA that patient sees Dr. Therisa Doyne and had EGD 2 years back in the office.  I will notify the [facility]. Spoke w Dr Hassell Done this am.  Acute blood loss anemia: Hb on admission-9.1 (14.1 on 09/27/2018), Status post 1 unit PRBC.  Hemoglobin stable continue to monitor Recent Labs  Lab 02/04/19 1736 02/04/19 2256 02/05/19 0536  HGB 9.1* 7.5* 8.2*  HCT 28.7* 24.2* 26.0*   AKI with hx of ESRD hx s/p DDRT in 2010.  Creatinine improved to 1.5 from 2.2.  Monitor.  Last creatinine 1.16 months ago.  On CellCept, tacrolimus.  Continue gentle IV fluids. Recent Labs  Lab 02/04/19 1736 02/05/19 0536  BUN 51* 43*  CREATININE 2.25* 1.56*    Peripheral vascular disease/HLD:status post bilateral common iliac stents and bilateral common femoral to below-knee popliteal bypass on aspirin, holding.  Continue statin  Essential hypertension: Pressure fairly stable to higher side.  Nifedipine and Toprol.  On admission was hypotensive.  Will resume one now.  Neurogenic dysfunction of the urinary bladder: Patient reports he does self-catheterization intermittently  Deceased-donor kidney transplant recipient: Continue HIS CellCept and tacrolimus.  Body mass index is 21.16 kg/m.   DVT prophylaxis: SCD Code Status: full Family Communication: plan of care discussed with patient in detail. Disposition Plan: Change the patient inpatient status, as he needs at least 2 midnight in hospital to complete work up for blood loss anemia, GI bleeding, and will need to make sure that he is tolerating diet and stabilization in hemoglobin. Having nausea this am.   Consultants: [facility]: for EGD  Microbiology:none  Antimicrobials: Anti-infectives (From admission,  onward)   None       Objective: Vitals:   02/05/19 0400 02/05/19 0500 02/05/19 0608 02/05/19 0700  BP: (!) 119/51 (!) 147/68 (!) 142/90 (!)  162/139  Pulse:  94 (!) 109   Resp: (!) 22 19 (!) 25 (!) 24  Temp: 98.7 F (37.1 C)  99 F (37.2 C)   TempSrc: Oral  Oral   SpO2: 99% 99% 96%   Weight:  66.9 kg    Height:        Intake/Output Summary (Last 24 hours) at 02/05/2019 0833 Last data filed at 02/05/2019 0700 Gross per 24 hour  Intake 1693.33 ml  Output 1402 ml  Net 291.33 ml   Filed Weights   02/04/19 1701 02/05/19 0500  Weight: 68 kg 66.9 kg   Weight change:   Body mass index is 21.16 kg/m.  Intake/Output from previous day: 10/04 0701 - 10/05 0700 In: 1693.3 [I.V.:378.3; Blood:315; IV Piggyback:1000] Out: 1402 [Urine:1401; Stool:1] Intake/Output this shift: No intake/output data recorded.  Examination:  General exam: Appears calm and comfortable,Not in distress, older fore the age HEENT:PERRL,Oral mucosa moist, Ear/Nose normal on gross exam Respiratory system: Bilateral equal air entry, normal vesicular breath sounds, no wheezes or crackles  Cardiovascular system: S1 & S2 heard,No JVD, murmurs. Gastrointestinal system: Abdomen is  soft, mildly tender on ventral to left side of abdomennon, non distended, BS +  Nervous System:Alert and oriented. No focal neurological deficits/moving extremities, sensation intact. Extremities: No edema, no clubbing, distal peripheral pulses palpable. Skin: No rashes, lesions, no icterus MSK: Normal muscle bulk,tone ,power  Medications:  Scheduled Meds: . Chlorhexidine Gluconate Cloth  6 each Topical Daily  . mycophenolate  500 mg Oral BID  . pantoprazole (PROTONIX) IV  40 mg Intravenous Q12H  . rosuvastatin  5 mg Oral QHS  . sodium chloride flush  3 mL Intravenous Q12H  . tacrolimus  3 mg Oral QHS  . tacrolimus  4 mg Oral Daily   Continuous Infusions: . sodium chloride 100 mL/hr at 02/05/19 0137    Data Reviewed: I have personally reviewed following labs and imaging studies  CBC: Recent Labs  Lab 02/04/19 1736 02/04/19 2256 02/05/19 0536  WBC 15.8*  --  9.6   HGB 9.1* 7.5* 8.2*  HCT 28.7* 24.2* 26.0*  MCV 93.5  --  93.2  PLT 200  --  297*   Basic Metabolic Panel: Recent Labs  Lab 02/04/19 1736 02/05/19 0536  NA 140 142  K 4.4 4.0  CL 107 113*  CO2 21* 23  GLUCOSE 140* 108*  BUN 51* 43*  CREATININE 2.25* 1.56*  CALCIUM 9.6 8.7*   GFR: Estimated Creatinine Clearance: 46.5 mL/min (A) (by C-G formula based on SCr of 1.56 mg/dL (H)). Liver Function Tests: Recent Labs  Lab 02/04/19 1736  AST 15  ALT 11  ALKPHOS 35*  BILITOT 0.5  PROT 6.2*  ALBUMIN 3.5   Recent Labs  Lab 02/04/19 1736  LIPASE 15   No results for input(s): AMMONIA in the last 168 hours. Coagulation Profile: Recent Labs  Lab 02/04/19 1736  INR 1.1   Cardiac Enzymes: No results for input(s): CKTOTAL, CKMB, CKMBINDEX, TROPONINI in the last 168 hours. BNP (last 3 results) No results for input(s): PROBNP in the last 8760 hours. HbA1C: No results for input(s): HGBA1C in the last 72 hours. CBG: No results for input(s): GLUCAP in the last 168 hours. Lipid Profile: No results for input(s): CHOL, HDL, LDLCALC, TRIG, CHOLHDL, LDLDIRECT in the last  72 hours. Thyroid Function Tests: No results for input(s): TSH, T4TOTAL, FREET4, T3FREE, THYROIDAB in the last 72 hours. Anemia Panel: No results for input(s): VITAMINB12, FOLATE, FERRITIN, TIBC, IRON, RETICCTPCT in the last 72 hours. Sepsis Labs: Recent Labs  Lab 02/04/19 1736 02/04/19 2022  LATICACIDVEN 3.4* 1.5    Recent Results (from the past 240 hour(s))  Culture, blood (Routine X 2) w Reflex to ID Panel     Status: None (Preliminary result)   Collection Time: 02/04/19  5:36 PM   Specimen: BLOOD RIGHT ARM  Result Value Ref Range Status   Specimen Description   Final    BLOOD RIGHT ARM Performed at [facility] [address], [place], [place] [address]    Special Requests   Final    BOTTLES DRAWN AEROBIC AND ANAEROBIC Blood Culture adequate volume Performed at [facility] [address] [place], [place] [address]    Culture   Final    NO GROWTH < 12 HOURS Performed at [facility] [address] [place], [place] [address]    Report Status PENDING  Incomplete  MRSA PCR Screening     Status: None   Collection Time: 02/04/19 10:00 PM   Specimen: Nasopharyngeal  Result Value Ref Range Status   MRSA by PCR NEGATIVE NEGATIVE Final    Comment:        The GeneXpert MRSA Assay (FDA approved for NASAL specimens only), is one component of a comprehensive MRSA colonization surveillance program. It is not intended to diagnose MRSA infection nor to guide or monitor treatment for MRSA infections. Performed at [facility], [place] [address] [place], [place] [address]   Culture, blood (Routine X 2) w Reflex to ID Panel     Status: None (Preliminary result)   Collection Time: 02/04/19 10:56 PM   Specimen: BLOOD  Result Value Ref Range Status   Specimen Description   Final    BLOOD RIGHT ANTECUBITAL Performed at [facility] [address], [place], [place] [address]    Special Requests   Final    BOTTLES DRAWN AEROBIC ONLY Blood Culture results may not be optimal due to an excessive volume of blood received in culture bottles Performed at [facility] [address] [place], [place] [address]    Culture   Final    NO GROWTH < 12 HOURS Performed at [facility] [address] [place],  [address]    Report Status PENDING  Incomplete      Radiology Studies: Ct Abdomen Pelvis Wo Contrast  Result Date: 02/04/2019 CLINICAL DATA:  Abdominal pain, prior renal transplant, vomiting and diarrhea EXAM: CT ABDOMEN AND PELVIS WITHOUT CONTRAST TECHNIQUE: Multidetector CT imaging of the abdomen and pelvis was performed following the standard protocol without IV contrast. COMPARISON:  December 28, 2015 FINDINGS: Lower chest: The visualized heart size within normal limits. Small  pericardial effusion/thickening. A small hiatal hernia present. The visualized portions of the lungs are clear. Hepatobiliary: Although limited due to the lack of intravenous contrast, normal in appearance without gross focal abnormality. No evidence of calcified gallstones or biliary ductal dilatation. Pancreas:  Unremarkable.  No surrounding inflammatory changes. Spleen: Normal in size. Although limited due to the lack of intravenous contrast, normal in appearance. Adrenals/Urinary Tract: Both adrenal glands appear normal. There is atrophy of the bilateral native kidneys. A right lower pelvic kidney transplant is visualized. A posterior left bladder wall diverticulum is  seen. Stomach/Bowel: The stomach, small bowel, and colon are normal in appearance. Scattered colonic diverticula are noted without diverticulitis. No inflammatory changes or obstructive findings. appendix is normal. Vascular/Lymphatic: There are no enlarged abdominal or pelvic lymph nodes. Dense aortic atherosclerosis is noted throughout. Reproductive: The prostate is unremarkable. Other: No evidence of abdominal wall mass or hernia. Musculoskeletal: No acute or significant osseous findings. IMPRESSION: no acute intra-abdominal or pelvic pathology. Electronically Signed   By: Prudencio Pair M.D.   On: 02/04/2019 19:55   Dg Chest Port 1 View  Result Date: 02/04/2019 CLINICAL DATA:  Hematemesis. Dark stools. Diarrhea.  Ex-smoker. EXAM: PORTABLE CHEST 1 VIEW COMPARISON:  09/21/2018 FINDINGS: Normal sized heart. Clear lungs. The lungs remain hyperexpanded with bilateral upper lobe bullous changes. Unremarkable bones. IMPRESSION: No acute abnormality. Stable changes of COPD. Electronically Signed   By: Claudie Revering M.D.   On: 02/04/2019 18:00    LOS: 0 days   Time spent: More than 50% of that time was spent in counseling and/or coordination of care.  Antonieta Pert, MD [facility]  02/05/2019, 8:33 AM

## 2019-02-05 NOTE — Anesthesia Postprocedure Evaluation (Signed)
Anesthesia Post Note  Patient: Scott Chavez  Procedure(s) Performed: ESOPHAGOGASTRODUODENOSCOPY (EGD) WITH PROPOFOL (N/A )     Patient location during evaluation: Endoscopy Anesthesia Type: MAC Level of consciousness: awake and alert Pain management: pain level controlled Vital Signs Assessment: post-procedure vital signs reviewed and stable Respiratory status: spontaneous breathing, nonlabored ventilation and respiratory function stable Cardiovascular status: blood pressure returned to baseline and stable Postop Assessment: no apparent nausea or vomiting Anesthetic complications: no    Last Vitals:  Vitals:   02/05/19 1315 02/05/19 1321  BP:  (!) 145/93  Pulse: 71 90  Resp: (!) 24 16  Temp:    SpO2: 100% 100%    Last Pain:  Vitals:   02/05/19 1321  TempSrc:   PainSc: 0-No pain                 Lidia Collum

## 2019-02-05 NOTE — Anesthesia Preprocedure Evaluation (Signed)
Anesthesia Evaluation  Patient identified by MRN, date of birth, ID band Patient awake    Reviewed: Allergy & Precautions, NPO status , Patient's Chart, lab work & pertinent test results  History of Anesthesia Complications Negative for: history of anesthetic complications  Airway Mallampati: II  TM Distance: >3 FB Neck ROM: Full    Dental   Pulmonary neg pulmonary ROS, former smoker,    Pulmonary exam normal        Cardiovascular hypertension, Pt. on home beta blockers and Pt. on medications + Peripheral Vascular Disease  Normal cardiovascular exam     Neuro/Psych negative neurological ROS  negative psych ROS   GI/Hepatic GERD  ,(+) Hepatitis -, CGI bleed   Endo/Other  negative endocrine ROS  Renal/GU ESRFRenal disease (s/p renal txp, no longer on dialysis)  negative genitourinary   Musculoskeletal negative musculoskeletal ROS (+)   Abdominal   Peds  Hematology  (+) anemia ,   Anesthesia Other Findings   Reproductive/Obstetrics                             Anesthesia Physical Anesthesia Plan  ASA: III  Anesthesia Plan: MAC   Post-op Pain Management:    Induction: Intravenous  PONV Risk Score and Plan: 1 and Propofol infusion, TIVA and Treatment may vary due to age or medical condition  Airway Management Planned: Natural Airway, Nasal Cannula and Simple Face Mask  Additional Equipment: None  Intra-op Plan:   Post-operative Plan:   Informed Consent: I have reviewed the patients History and Physical, chart, labs and discussed the procedure including the risks, benefits and alternatives for the proposed anesthesia with the patient or authorized representative who has indicated his/her understanding and acceptance.       Plan Discussed with:   Anesthesia Plan Comments:         Anesthesia Quick Evaluation

## 2019-02-06 ENCOUNTER — Encounter (HOSPITAL_COMMUNITY): Payer: Self-pay | Admitting: Gastroenterology

## 2019-02-06 LAB — CBC
HCT: 22.8 % — ABNORMAL LOW (ref 39.0–52.0)
HCT: 24.4 % — ABNORMAL LOW (ref 39.0–52.0)
Hemoglobin: 7.1 g/dL — ABNORMAL LOW (ref 13.0–17.0)
Hemoglobin: 7.5 g/dL — ABNORMAL LOW (ref 13.0–17.0)
MCH: 29.6 pg (ref 26.0–34.0)
MCH: 29.4 pg (ref 26.0–34.0)
MCHC: 31.1 g/dL (ref 30.0–36.0)
MCHC: 30.7 g/dL (ref 30.0–36.0)
MCV: 95 fL (ref 80.0–100.0)
MCV: 95.7 fL (ref 80.0–100.0)
Platelets: 113 10*3/uL — ABNORMAL LOW (ref 150–400)
Platelets: 114 10*3/uL — ABNORMAL LOW (ref 150–400)
RBC: 2.4 MIL/uL — ABNORMAL LOW (ref 4.22–5.81)
RBC: 2.55 MIL/uL — ABNORMAL LOW (ref 4.22–5.81)
RDW: 14 % (ref 11.5–15.5)
RDW: 14 % (ref 11.5–15.5)
WBC: 6 10*3/uL (ref 4.0–10.5)
WBC: 5.8 10*3/uL (ref 4.0–10.5)
nRBC: 0 % (ref 0.0–0.2)
nRBC: 0 % (ref 0.0–0.2)

## 2019-02-06 LAB — BASIC METABOLIC PANEL
Anion gap: 6 (ref 5–15)
BUN: 20 mg/dL (ref 8–23)
CO2: 23 mmol/L (ref 22–32)
Calcium: 8.4 mg/dL — ABNORMAL LOW (ref 8.9–10.3)
Chloride: 110 mmol/L (ref 98–111)
Creatinine, Ser: 1.04 mg/dL (ref 0.61–1.24)
GFR calc Af Amer: >60 mL/min (ref >60)
GFR calc non Af Amer: >60 mL/min (ref >60)
Glucose, Bld: 101 mg/dL — ABNORMAL HIGH (ref 70–99)
Potassium: 4 mmol/L (ref 3.5–5.1)
Sodium: 139 mmol/L (ref 135–145)

## 2019-02-06 LAB — GLUCOSE, CAPILLARY: Glucose-Capillary: 112 mg/dL — ABNORMAL HIGH (ref 70–99)

## 2019-02-06 NOTE — Progress Notes (Signed)
[facility] Progress Note  Subjective: Feel good today. No further active bleeding noted. Tolerated clear liquids.  Objective: Vital signs in last 24 hours: Temp:  [97.9 F (36.6 C)-99.9 F (37.7 C)] 97.9 F (36.6 C) (10/06 0700) Pulse Rate:  [56-100] 60 (10/06 0900) Resp:  [15-24] 20 (10/06 0900) BP: (117-145)/(51-93) 129/66 (10/06 0800) SpO2:  [96 %-100 %] 100 % (10/06 0900) Weight change:    PE: No distress Heart Regular Alert Abdomen non tender.   Lab Results: Results for orders placed or performed during the hospital encounter of 02/04/19 (from the past 24 hour(s))  Hemoglobin and hematocrit, blood     Status: Abnormal   Collection Time: 02/05/19 11:18 AM  Result Value Ref Range   Hemoglobin 7.8 (L) 13.0 - 17.0 g/dL   HCT 25.2 (L) 39.0 - 16.1 %  Basic metabolic panel     Status: Abnormal   Collection Time: 02/06/19  2:09 AM  Result Value Ref Range   Sodium 139 135 - 145 mmol/L   Potassium 4.0 3.5 - 5.1 mmol/L   Chloride 110 98 - 111 mmol/L   CO2 23 22 - 32 mmol/L   Glucose, Bld 101 (H) 70 - 99 mg/dL   BUN 20 8 - 23 mg/dL   Creatinine, Ser 1.04 0.61 - 1.24 mg/dL   Calcium 8.4 (L) 8.9 - 10.3 mg/dL   GFR calc non Af Amer >60 >60 mL/min   GFR calc Af Amer >60 >60 mL/min   Anion gap 6 5 - 15  CBC     Status: Abnormal   Collection Time: 02/06/19  2:09 AM  Result Value Ref Range   WBC 6.0 4.0 - 10.5 K/uL   RBC 2.40 (L) 4.22 - 5.81 MIL/uL   Hemoglobin 7.1 (L) 13.0 - 17.0 g/dL   HCT 22.8 (L) 39.0 - 52.0 %   MCV 95.0 80.0 - 100.0 fL   MCH 29.6 26.0 - 34.0 pg   MCHC 31.1 30.0 - 36.0 g/dL   RDW 14.0 11.5 - 15.5 %   Platelets 113 (L) 150 - 400 K/uL   nRBC 0.0 0.0 - 0.2 %    Studies/Results: No results found.    Assessment: GI bleed due to [employer]. Slight drop in HGb most likley equilibrating.  Plan:   Continue PPI Full liquids    Cassell Clement 02/06/2019, 9:32 AM  Pager: [phone] If no answer or after 5 PM call  [phone] Lab Results  Component Value Date   HGB 7.1 (L) 02/06/2019   HGB 7.8 (L) 02/05/2019   HGB 8.2 (L) 02/05/2019   HGB 15.0 05/12/2018   HGB 14.9 03/10/2018   HGB 13.9 01/20/2018   HGB 11.2 (L) 03/18/2017   HCT 22.8 (L) 02/06/2019   HCT 25.2 (L) 02/05/2019   HCT 26.0 (L) 02/05/2019   HCT 36.2 (L) 03/18/2017   ALKPHOS 35 (L) 02/04/2019   ALKPHOS 53 05/12/2018   ALKPHOS 50 03/10/2018   ALKPHOS 51 03/18/2017   AST 15 02/04/2019   AST 18 05/12/2018   AST 18 03/10/2018   AST 19 11/09/2017   AST 19 03/18/2017   ALT 11 02/04/2019   ALT 13 05/12/2018   ALT 12 03/10/2018   ALT 10 11/09/2017   ALT 13 03/18/2017

## 2019-02-06 NOTE — Progress Notes (Addendum)
PROGRESS NOTE    Scott Chavez  NUU:[id number] DOB: 1955-10-05 DOA: 02/04/2019 PCP: Benito Mccreedy, MD   Brief Narrative: As per H@P : 63 y.o. male with medical history significant for ESRD s/p DDRT in 2010 on immunosuppressants, PVD, hypertension, hyperlipidemia, GERD, chronic hepatitis C s/p treatment with Zepatier, neurogenic bladder, and ADHD who presents to the ED with complaint of nausea vomiting abdominal pain and diarrhea started 10/3 morning after having dinner at [employer]  On 9/2.  Initially emesis with food later red blood.  Since then not being able to maintain oral intake including his medication.  On aspirin 81 but no NSAIDs, no EtOH.   In the ER, BP 115/54, pulse 108, RR 27, temp 97.8 Fahrenheit, SPO2 100% on room air.  Labs notable for WBC 15.8, hemoglobin 9.1 (14.1 on 09/27/2018), platelets 200,000, BUN 51, creatinine 2.25 (baseline creatinine 1.1-1.2), sodium 140, potassium 4.4, serum glucose 140, lipase 15, lactic acid 3.4, PT 13.6, INR 1.1, PTT 24, high-sensitivity troponin I 18.  FOBT is positive. SARS-CoV-2 test was collected and pending.  Portable chest x-ray showed hyperexpanded lung fields without acute cardiopulmonary process. CT abdomen/pelvis without contrast was negative for acute intra-abdominal or pelvic pathology. S/p 2 L normal saline and fentanyl for pain. GI consulted and admitted COVID-19 negative.  Subjective:  Seen and examined doing well - Fever, - chills, - dark stool, - Tarry stool, - chest pain, - blurred vision, - unilateral weakness overall has improved from prior  Assessment & Plan:   Nausea vomiting diarrhea, with hematemesis and hematochezia/melena: PTA Naprosyn, ASA--diet as per GI-endoscopy performed 10/5 showing MW tear as below ?  When to resume aspirin 81 On Protonix 40 twice daily IV transition when okayed by GI  Acute blood loss anemia: Hb on admission-9.1 (14.1 on 09/27/2018), Status post 1 unit PRBC hemoglobin  7.5 range--  AKI with hx of ESRD hx s/p DDRT in 2010.  Creatinine improved to 1.5 from 2.2.  Monitor.  Last creatinine 1.16 months ago.  On CellCept, tacrolimus.  Creatinine now down to 1.04  Peripheral vascular disease/HLD:status post bilateral common iliac stents and bilateral common femoral to below-knee popliteal bypass on aspirin, holding.  Continue statin-continue Lyrica 50 3 times daily  Essential hypertension: Pressure fairly stable to higher side.  Toprol 25 ii/day XL resumed, nifedipine held  Neurogenic dysfunction of the urinary bladder: Patient reports he does self-catheterization intermittently  Deceased-donor kidney transplant recipient: Continue HIS CellCept and tacrolimus.  Depression Continue Vynase 20 twice daily, Celexa 20 twice daily, Ambien 5 at bedtime  Body mass index is 21.16 kg/m.   DVT prophylaxis: SCD Code Status: full Family Communication: plan of care discussed with patient in detail. Disposition Plan: Await tolerance of diet still on full liquids also monitor for further bleeding may need further transfusion if drops below 7--- at this time stable for transfer to [place] does not need telemetry  Consultants: [facility] Procedures: Scope 02/05/2019 Impression: - Normal larynx. - Small hiatal hernia. - 2 Mallory-Weiss tears. - Normal stomach. - Normal duodenal bulb, first portion of the  duodenum, second portion of the duodenum and                                    third portion of the duodenum. - The examination was otherwise normal. - No specimens collected.  Recommendation: - Clear liquid diet today. - Continue present medications. - No aspirin, ibuprofen, naproxen,  or other   non-steroidal anti-inflammatory drugs for 2                                weeks. - Return to GI clinic PRN. - Telephone GI clinic if symptomatic PRN  Microbiology:none  Antimicrobials: Anti-infectives (From admission, onward)   None       Objective: Vitals:   02/06/19 1100 02/06/19 1200 02/06/19 1300 02/06/19 1400  BP:  (!) 99/42    Pulse: 60 62 65 69  Resp: 19   (!) 26  Temp: 98 F (36.7 C)     TempSrc: Oral     SpO2: 100% 100% 100% 97%  Weight:      Height:        Intake/Output Summary (Last 24 hours) at 02/06/2019 1514 Last data filed at 02/06/2019 1437 Gross per 24 hour  Intake 1760.97 ml  Output 2050 ml  Net -289.03 ml   Filed Weights   02/04/19 1701 02/05/19 0500  Weight: 68 kg 66.9 kg   Weight change:   Body mass index is 21.16 kg/m.  Intake/Output from previous day: 10/05 0701 - 10/06 0700 In: 1493.5 [P.O.:600; I.V.:893.5] Out: 2150 [Urine:1475] Intake/Output this shift: Total I/O In: 367.4 [I.V.:367.4] Out: 700 [Urine:700]  Examination:  General exam: Appears calm and comfortable,Not in distress, older fore the age HEENT:PERRL,Oral mucosa moist, Ear/Nose normal on gross exam Respiratory system: Bilateral equal air entry, normal vesicular breath sounds, no wheezes or crackles  Cardiovascular system: S1 & S2 heard,No JVD, murmurs. Gastrointestinal system: Abdomen is  soft, mildly tender on ventral to left side of abdomennon, non distended, BS +  Nervous System:Alert and oriented. No focal neurological deficits/moving extremities, sensation intact. Extremities: No edema, no clubbing, distal peripheral pulses palpable. Skin: No rashes, lesions, no icterus MSK: Normal muscle bulk,tone ,power  Medications:  Scheduled Meds: . Chlorhexidine Gluconate Cloth  6 each Topical Daily  . citalopram  20 mg Oral BID  . lisdexamfetamine  20 mg Oral BID  . metoprolol succinate  25  mg Oral BID  . mycophenolate  250 mg Oral QHS  . mycophenolate  500 mg Oral Daily  . pantoprazole (PROTONIX) IV  40 mg Intravenous Q12H  . pregabalin  50 mg Oral TID  . rosuvastatin  5 mg Oral QHS  . sodium chloride flush  3 mL Intravenous Q12H  . tacrolimus  3 mg Oral QHS  . tacrolimus  4 mg Oral Daily  . vitamin B-12  100 mcg Oral Daily  . zolpidem  5 mg Oral QHS   Continuous Infusions: . sodium chloride 50 mL/hr at 02/06/19 1030    Data Reviewed: I have personally reviewed following labs and imaging studies  CBC: Recent Labs  Lab 02/04/19 1736 02/04/19 2256 02/05/19 0536 02/05/19 1118 02/06/19 0209 02/06/19 1357  WBC 15.8*  --  9.6  --  6.0 5.8  HGB 9.1* 7.5* 8.2* 7.8* 7.1* 7.5*  HCT 28.7* 24.2* 26.0* 25.2* 22.8* 24.4*  MCV 93.5  --  93.2  --  95.0 95.7  PLT 200  --  124*  --  113* 937*   Basic Metabolic Panel: Recent Labs  Lab 02/04/19 1736 02/05/19 0536 02/06/19 0209  NA 140 142 139  K 4.4 4.0 4.0  CL 107 113* 110  CO2 21* 23 23  GLUCOSE 140* 108* 101*  BUN 51* 43* 20  CREATININE 2.25* 1.56* 1.04  CALCIUM 9.6 8.7* 8.4*  GFR: Estimated Creatinine Clearance: 69.7 mL/min (by C-G formula based on SCr of 1.04 mg/dL). Liver Function Tests: Recent Labs  Lab 02/04/19 1736  AST 15  ALT 11  ALKPHOS 35*  BILITOT 0.5  PROT 6.2*  ALBUMIN 3.5   Recent Labs  Lab 02/04/19 1736  LIPASE 15   No results for input(s): AMMONIA in the last 168 hours. Coagulation Profile: Recent Labs  Lab 02/04/19 1736  INR 1.1   Cardiac Enzymes: No results for input(s): CKTOTAL, CKMB, CKMBINDEX, TROPONINI in the last 168 hours. BNP (last 3 results) No results for input(s): PROBNP in the last 8760 hours. HbA1C: No results for input(s): HGBA1C in the last 72 hours. CBG: No results for input(s): GLUCAP in the last 168 hours. Lipid Profile: No results for input(s): CHOL, HDL, LDLCALC, TRIG, CHOLHDL, LDLDIRECT in the last 72 hours. Thyroid Function Tests: No results for  input(s): TSH, T4TOTAL, FREET4, T3FREE, THYROIDAB in the last 72 hours. Anemia Panel: No results for input(s): VITAMINB12, FOLATE, FERRITIN, TIBC, IRON, RETICCTPCT in the last 72 hours. Sepsis Labs: Recent Labs  Lab 02/04/19 1736 02/04/19 2022  LATICACIDVEN 3.4* 1.5    Recent Results (from the past 240 hour(s))  Culture, blood (Routine X 2) w Reflex to ID Panel     Status: None (Preliminary result)   Collection Time: 02/04/19  5:36 PM   Specimen: BLOOD RIGHT ARM  Result Value Ref Range Status   Specimen Description   Final    BLOOD RIGHT ARM Performed at [facility] [address] [place], [place] [address]    Special Requests   Final    BOTTLES DRAWN AEROBIC AND ANAEROBIC Blood Culture adequate volume Performed at [facility] [address], [place], [place] [address]    Culture   Final    NO GROWTH 2 DAYS Performed at [facility] [address], [place], [place] [address]    Report Status PENDING  Incomplete  SARS CORONAVIRUS 2 (TAT 6-24 HRS) Nasopharyngeal Nasopharyngeal Swab     Status: None   Collection Time: 02/04/19  8:22 PM   Specimen: Nasopharyngeal Swab  Result Value Ref Range Status   SARS Coronavirus 2 NEGATIVE NEGATIVE Final    Comment: (NOTE) SARS-CoV-2 target nucleic acids are NOT DETECTED. The SARS-CoV-2 RNA is generally detectable in upper and lower respiratory specimens during the acute phase of infection. Negative results do not preclude SARS-CoV-2 infection, do not rule out co-infections with other pathogens, and should not be used as the sole basis for treatment or other patient management decisions. Negative results must be combined with clinical observations, patient history, and epidemiological information. The expected result is Negative. Fact Sheet for Patients: SugarRoll.be Fact Sheet for Healthcare Providers: https://www.woods-mathews.com/ This test is not yet  approved or cleared by the [place] FDA and  has been authorized for detection and/or diagnosis of SARS-CoV-2 by FDA under an Emergency Use Authorization (EUA). This EUA will remain  in effect (meaning this test can be used) for the duration of the COVID-19 declaration under Section 56 4(b)(1) of the Act, 21 U.S.C. section 360bbb-3(b)(1), unless the authorization is terminated or revoked sooner. Performed at [facility], [place] [address] [place], [place] [address]   MRSA PCR Screening     Status: None   Collection Time: 02/04/19 10:00 PM   Specimen: Nasopharyngeal  Result Value Ref Range Status   MRSA by PCR NEGATIVE NEGATIVE Final    Comment:  The GeneXpert MRSA Assay (FDA approved for NASAL specimens only), is one component of a comprehensive MRSA colonization surveillance program. It is not intended to diagnose MRSA infection nor to guide or monitor treatment for MRSA infections. Performed at [facility], [place] [address], [place], [place] [address]   Culture, blood (Routine X 2) w Reflex to ID Panel     Status: None (Preliminary result)   Collection Time: 02/04/19 10:56 PM   Specimen: BLOOD  Result Value Ref Range Status   Specimen Description   Final    BLOOD RIGHT ANTECUBITAL Performed at [facility] [address] [place], [place] [address]    Special Requests   Final    BOTTLES DRAWN AEROBIC ONLY Blood Culture results may not be optimal due to an excessive volume of blood received in culture bottles Performed at [facility] [address] [place], [place] [address]    Culture   Final    NO GROWTH 1 DAY Performed at [facility], [place] [address] [place], [place] [address]    Report Status PENDING  Incomplete      Radiology Studies: Ct Abdomen Pelvis Wo Contrast  Result Date: 02/04/2019 CLINICAL DATA:  Abdominal pain, prior renal transplant, vomiting and diarrhea EXAM: CT  ABDOMEN AND PELVIS WITHOUT CONTRAST TECHNIQUE: Multidetector CT imaging of the abdomen and pelvis was performed following the standard protocol without IV contrast. COMPARISON:  December 28, 2015 FINDINGS: Lower chest: The visualized heart size within normal limits. Small pericardial effusion/thickening. A small hiatal hernia present. The visualized portions of the lungs are clear. Hepatobiliary: Although limited due to the lack of intravenous contrast, normal in appearance without gross focal abnormality. No evidence of calcified gallstones or biliary ductal dilatation. Pancreas:  Unremarkable.  No surrounding inflammatory changes. Spleen: Normal in size. Although limited due to the lack of intravenous contrast, normal in appearance. Adrenals/Urinary Tract: Both adrenal glands appear normal. There is atrophy of the bilateral native kidneys. A right lower pelvic kidney transplant is visualized. A posterior left bladder wall diverticulum is seen. Stomach/Bowel: The stomach, small bowel, and colon are normal in appearance. Scattered colonic diverticula are noted without diverticulitis. No inflammatory changes or obstructive findings. appendix is normal. Vascular/Lymphatic: There are no enlarged abdominal or pelvic lymph nodes. Dense aortic atherosclerosis is noted throughout. Reproductive: The prostate is unremarkable. Other: No evidence of abdominal wall mass or hernia. Musculoskeletal: No acute or significant osseous findings. IMPRESSION: no acute intra-abdominal or pelvic pathology. Electronically Signed   By: Prudencio Pair M.D.   On: 02/04/2019 19:55   Dg Chest Port 1 View  Result Date: 02/04/2019 CLINICAL DATA:  Hematemesis. Dark stools. Diarrhea.  Ex-smoker. EXAM: PORTABLE CHEST 1 VIEW COMPARISON:  09/21/2018 FINDINGS: Normal sized heart. Clear lungs. The lungs remain hyperexpanded with bilateral upper lobe bullous changes. Unremarkable bones. IMPRESSION: No acute abnormality. Stable changes of COPD.  Electronically Signed   By: Claudie Revering M.D.   On: 02/04/2019 18:00    LOS: 1 day   Time spent: More than 50% of that time was spent in counseling and/or coordination of care.  Nita Sells, MD [facility]  02/06/2019, 3:14 PM

## 2019-02-07 LAB — COMPREHENSIVE METABOLIC PANEL
ALT: 9 U/L (ref 0–44)
AST: 11 U/L — ABNORMAL LOW (ref 15–41)
Albumin: 3 g/dL — ABNORMAL LOW (ref 3.5–5.0)
Alkaline Phosphatase: 30 U/L — ABNORMAL LOW (ref 38–126)
Anion gap: 8 (ref 5–15)
BUN: 11 mg/dL (ref 8–23)
CO2: 23 mmol/L (ref 22–32)
Calcium: 8.8 mg/dL — ABNORMAL LOW (ref 8.9–10.3)
Chloride: 107 mmol/L (ref 98–111)
Creatinine, Ser: 0.95 mg/dL (ref 0.61–1.24)
GFR calc Af Amer: >60 mL/min (ref >60)
GFR calc non Af Amer: >60 mL/min (ref >60)
Glucose, Bld: 108 mg/dL — ABNORMAL HIGH (ref 70–99)
Potassium: 4 mmol/L (ref 3.5–5.1)
Sodium: 138 mmol/L (ref 135–145)
Total Bilirubin: 0.5 mg/dL (ref 0.3–1.2)
Total Protein: 5.4 g/dL — ABNORMAL LOW (ref 6.5–8.1)

## 2019-02-07 LAB — CBC WITH DIFFERENTIAL/PLATELET
Abs Immature Granulocytes: 0.04 10*3/uL (ref 0.00–0.07)
Basophils Absolute: 0 10*3/uL (ref 0.0–0.1)
Basophils Relative: 0 %
Eosinophils Absolute: 0.1 10*3/uL (ref 0.0–0.5)
Eosinophils Relative: 2 %
HCT: 23.5 % — ABNORMAL LOW (ref 39.0–52.0)
Hemoglobin: 7.4 g/dL — ABNORMAL LOW (ref 13.0–17.0)
Immature Granulocytes: 1 %
Lymphocytes Relative: 15 %
Lymphs Abs: 0.8 10*3/uL (ref 0.7–4.0)
MCH: 29.6 pg (ref 26.0–34.0)
MCHC: 31.5 g/dL (ref 30.0–36.0)
MCV: 94 fL (ref 80.0–100.0)
Monocytes Absolute: 0.5 10*3/uL (ref 0.1–1.0)
Monocytes Relative: 9 %
Neutro Abs: 3.6 10*3/uL (ref 1.7–7.7)
Neutrophils Relative %: 73 %
Platelets: 122 10*3/uL — ABNORMAL LOW (ref 150–400)
RBC: 2.5 MIL/uL — ABNORMAL LOW (ref 4.22–5.81)
RDW: 13.8 % (ref 11.5–15.5)
WBC: 5 10*3/uL (ref 4.0–10.5)
nRBC: 0 % (ref 0.0–0.2)

## 2019-02-07 MED ORDER — FERROUS SULFATE 325 (65 FE) MG PO TBEC: 325.0000 mg | DELAYED_RELEASE_TABLET | Freq: Two times a day (BID) | ORAL | 3 refills | Status: AC

## 2019-02-07 MED ORDER — PANTOPRAZOLE SODIUM 40 MG PO TBEC: 40.0000 mg | DELAYED_RELEASE_TABLET | Freq: Two times a day (BID) | ORAL | 11 refills | Status: DC

## 2019-02-07 NOTE — Progress Notes (Signed)
[facility] Progress Note  Subjective: The patient is doing very well today and feels fine.  He would like to go home.  He is tolerating diet.  He denies any further signs of gastrointestinal bleeding.  Objective: Vital signs in last 24 hours: Temp:  [98 F (36.7 C)-98.6 F (37 C)] 98.5 F (36.9 C) (10/07 0543) Pulse Rate:  [57-86] 58 (10/07 0543) Resp:  [11-26] 16 (10/07 0543) BP: (99-146)/(42-86) 122/67 (10/07 0543) SpO2:  [97 %-100 %] 100 % (10/07 0543) Weight:  [69.3 kg] 69.3 kg (10/07 0500) Weight change:    PE:  No distress  Heart regular rhythm  Abdomen soft and nontender  Lab Results: Results for orders placed or performed during the hospital encounter of 02/04/19 (from the past 24 hour(s))  CBC     Status: Abnormal   Collection Time: 02/06/19  1:57 PM  Result Value Ref Range   WBC 5.8 4.0 - 10.5 K/uL   RBC 2.55 (L) 4.22 - 5.81 MIL/uL   Hemoglobin 7.5 (L) 13.0 - 17.0 g/dL   HCT 24.4 (L) 39.0 - 52.0 %   MCV 95.7 80.0 - 100.0 fL   MCH 29.4 26.0 - 34.0 pg   MCHC 30.7 30.0 - 36.0 g/dL   RDW 14.0 11.5 - 15.5 %   Platelets 114 (L) 150 - 400 K/uL   nRBC 0.0 0.0 - 0.2 %  Glucose, capillary     Status: Abnormal   Collection Time: 02/06/19  3:33 PM  Result Value Ref Range   Glucose-Capillary 112 (H) 70 - 99 mg/dL  CBC with Differential/Platelet     Status: Abnormal   Collection Time: 02/07/19  5:38 AM  Result Value Ref Range   WBC 5.0 4.0 - 10.5 K/uL   RBC 2.50 (L) 4.22 - 5.81 MIL/uL   Hemoglobin 7.4 (L) 13.0 - 17.0 g/dL   HCT 23.5 (L) 39.0 - 52.0 %   MCV 94.0 80.0 - 100.0 fL   MCH 29.6 26.0 - 34.0 pg   MCHC 31.5 30.0 - 36.0 g/dL   RDW 13.8 11.5 - 15.5 %   Platelets 122 (L) 150 - 400 K/uL   nRBC 0.0 0.0 - 0.2 %   Neutrophils Relative % 73 %   Neutro Abs 3.6 1.7 - 7.7 K/uL   Lymphocytes Relative 15 %   Lymphs Abs 0.8 0.7 - 4.0 K/uL   Monocytes Relative 9 %   Monocytes Absolute 0.5 0.1 - 1.0 K/uL   Eosinophils Relative 2 %   Eosinophils  Absolute 0.1 0.0 - 0.5 K/uL   Basophils Relative 0 %   Basophils Absolute 0.0 0.0 - 0.1 K/uL   Immature Granulocytes 1 %   Abs Immature Granulocytes 0.04 0.00 - 0.07 K/uL  Comprehensive metabolic panel     Status: Abnormal   Collection Time: 02/07/19  5:38 AM  Result Value Ref Range   Sodium 138 135 - 145 mmol/L   Potassium 4.0 3.5 - 5.1 mmol/L   Chloride 107 98 - 111 mmol/L   CO2 23 22 - 32 mmol/L   Glucose, Bld 108 (H) 70 - 99 mg/dL   BUN 11 8 - 23 mg/dL   Creatinine, Ser 0.95 0.61 - 1.24 mg/dL   Calcium 8.8 (L) 8.9 - 10.3 mg/dL   Total Protein 5.4 (L) 6.5 - 8.1 g/dL   Albumin 3.0 (L) 3.5 - 5.0 g/dL   AST 11 (L) 15 - 41 U/L   ALT 9 0 - 44 U/L   Alkaline Phosphatase  30 (L) 38 - 126 U/L   Total Bilirubin 0.5 0.3 - 1.2 mg/dL   GFR calc non Af Amer >60 >60 mL/min   GFR calc Af Amer >60 >60 mL/min   Anion gap 8 5 - 15    Studies/Results: No results found.    Assessment: Upper GI bleed from Mallory-Weiss tear  Plan:   Patient appears to be stable clinically at this time.  I think he is okay for discharge.  I would send him home on pantoprazole 40 mg twice daily.  I would also place him on an iron supplement to help build up his hemoglobin.  Diet as tolerated.  He can follow-up with Dr. Watt Climes in a few weeks.  He should avoid alcohol and NSAIDs.    Cassell Clement 02/07/2019, 9:13 AM  Pager: [phone] If no answer or after 5 PM call [phone] Lab Results  Component Value Date   HGB 7.4 (L) 02/07/2019   HGB 7.5 (L) 02/06/2019   HGB 7.1 (L) 02/06/2019   HGB 15.0 05/12/2018   HGB 14.9 03/10/2018   HGB 13.9 01/20/2018   HGB 11.2 (L) 03/18/2017   HCT 23.5 (L) 02/07/2019   HCT 24.4 (L) 02/06/2019   HCT 22.8 (L) 02/06/2019   HCT 36.2 (L) 03/18/2017   ALKPHOS 30 (L) 02/07/2019   ALKPHOS 35 (L) 02/04/2019   ALKPHOS 53 05/12/2018   ALKPHOS 51 03/18/2017   AST 11 (L) 02/07/2019   AST 15 02/04/2019   AST 18 05/12/2018   AST 18 03/10/2018   AST 19 11/09/2017   AST  19 03/18/2017   ALT 9 02/07/2019   ALT 11 02/04/2019   ALT 13 05/12/2018   ALT 12 03/10/2018   ALT 10 11/09/2017   ALT 13 03/18/2017

## 2019-02-07 NOTE — Plan of Care (Signed)
  Problem: Education: Goal: Ability to identify signs and symptoms of gastrointestinal bleeding will improve Outcome: Adequate for Discharge   Problem: Bowel/Gastric: Goal: Will show no signs and symptoms of gastrointestinal bleeding Outcome: Adequate for Discharge   Problem: Fluid Volume: Goal: Will show no signs and symptoms of excessive bleeding Outcome: Adequate for Discharge   Problem: Clinical Measurements: Goal: Complications related to the disease process, condition or treatment will be avoided or minimized Outcome: Adequate for Discharge   Problem: Education: Goal: Knowledge of General Education information will improve Description: Including pain rating scale, medication(s)/side effects and non-pharmacologic comfort measures Outcome: Adequate for Discharge   Problem: Health Behavior/Discharge Planning: Goal: Ability to manage health-related needs will improve Outcome: Adequate for Discharge   Problem: Clinical Measurements: Goal: Ability to maintain clinical measurements within normal limits will improve Outcome: Adequate for Discharge Goal: Will remain free from infection Outcome: Adequate for Discharge Goal: Diagnostic test results will improve Outcome: Adequate for Discharge Goal: Respiratory complications will improve Outcome: Adequate for Discharge Goal: Cardiovascular complication will be avoided Outcome: Adequate for Discharge   Problem: Activity: Goal: Risk for activity intolerance will decrease Outcome: Adequate for Discharge   Problem: Nutrition: Goal: Adequate nutrition will be maintained Outcome: Adequate for Discharge   Problem: Coping: Goal: Level of anxiety will decrease Outcome: Adequate for Discharge   Problem: Elimination: Goal: Will not experience complications related to bowel motility Outcome: Adequate for Discharge Goal: Will not experience complications related to urinary retention Outcome: Adequate for Discharge   Problem: Pain  Managment: Goal: General experience of comfort will improve Outcome: Adequate for Discharge   Problem: Safety: Goal: Ability to remain free from injury will improve Outcome: Adequate for Discharge   Problem: Skin Integrity: Goal: Risk for impaired skin integrity will decrease Outcome: Adequate for Discharge   

## 2019-02-07 NOTE — Discharge Summary (Signed)
Physician Discharge Summary  Scott Chavez ZDG:[id number] DOB: [birth year] DOA: 02/04/2019  PCP: Benito Mccreedy, MD  Admit date: 02/04/2019 Discharge date: 02/07/2019  Time spent: 45 minutes  Recommendations for Outpatient Follow-up:  1. Will need outpatient follow-up with gastroenterology Dr. Watt Climes 2 to 3 weeks 2. Needs Chem-12, CBC 1 week 3. Check CellCept/tacrolimus levels outpatient 4. No aspirin for several weeks post discharge and caution with resolution of the same as he does need it for peripheral vascular disease-no nonsteroidals  Discharge Diagnoses:  Principal Problem:   GI bleeding Active Problems:   Essential hypertension   Hyperlipidemia   Peripheral vascular disease, unspecified (HCC)   Neurogenic dysfunction of the urinary bladder   Deceased-donor kidney transplant recipient   AKI (acute kidney injury) ([place])   Acute upper GI bleeding   Discharge Condition: Improved  Diet recommendation: Renal  Filed Weights   02/04/19 1701 02/05/19 0500 02/07/19 0500  Weight: 68 kg 66.9 kg 69.3 kg    History of present illness:  As per H&P: 63 y.o.malewith medical history significant forESRD s/p DDRT in 2010 on immunosuppressants, PVD, hypertension, hyperlipidemia, GERD, chronic hepatitis C s/p treatment with Zepatier, neurogenic bladder, and ADHD who presents to the ED with complaint of nausea vomiting abdominal pain and diarrhea started 10/3 morning after having dinner at [employer]  On 9/2.  Initially emesis with food later red blood.  Since then not being able to maintain oral intake including his medication  On admission hemoglobin down to 9 from 14 BUN/creatinine 51/2.2 up from 1.1 lactic acid 3.4 FOBT positive CXR normal CT abdomen normal  Hospital Course:  Nausea vomiting diarrhea, with hematemesis and hematochezia/melena: PTA Naprosyn, ASA--diet as per GI-endoscopy performed 10/5 showing MW tear as below ---  discussed with Dr. Penelope Coop of GI who  recommends holding aspirin for several weeks before even consideration of the same Transition from Protonix IV to p.o. and patient was okayed for regular diet on discharge  Acute blood loss anemia: Hb on admission-9.1 (14.1 on 09/27/2018), Status post 1 unit PRBC hemoglobin 7.5 range--discharging home on iron daily and will need recheck in the outpatient setting-May require iron levels in addition  AKI with hx of ESRD hx s/p DDRT in 2010.  Creatinine improved on discharge 11/0.9 down from 2.5 on admission.  On CellCept, tacrolimus.  Peripheral vascular disease/HLD:status post bilateral common iliac stents and bilateral common femoral to below-knee popliteal bypass on aspirin, holding.  Continue statin-continue Lyrica 50 3 times daily  Essential hypertension: Pressure fairly stable to higher side.  Toprol 25 ii/day XL resumed, nifedipine held  Neurogenic dysfunction of the urinary bladder: Patient reports he does self-catheterization intermittently  Deceased-donor kidney transplant recipient: Continue HIS CellCept and tacrolimus.  Depression Continue Vynase 20 twice daily, Celexa 20 twice daily, Ambien 5 at bedtime  Body mass index is 21.16 kg/m.   Procedures:  Endoscopy   Consultations:  GI  Discharge Exam: Vitals:   02/06/19 2122 02/07/19 0543  BP: (!) 146/79 122/67  Pulse: 68 (!) 58  Resp: 16 16  Temp: 98.2 F (36.8 C) 98.5 F (36.9 C)  SpO2: 100% 100%    General: Awake alert no distress EOMI NCAT anicteric no pallor Cardiovascular: S1-S2 no murmur rub or gallop Respiratory: Clinically clear no rales no rhonchi no focal deficit Abdomen soft nontender no rebound no guarding neurologically intact  Discharge Instructions   Discharge Instructions    Diet - low sodium heart healthy   Complete by: As directed  Discharge instructions   Complete by: As directed    Do not take aspirin for the time being wait until you have followed by gastroenterology in about  a month's time-take Protonix twice a day and we have represcribed for you iron pills to take do not take any nonsteroidals or any alcohol in excess Make sure you get lab work done at your regular physician office in about a week or 2 Eat a soft diet to start with and graduated as tolerated to a regular diet within the next 3 to 4 days   Increase activity slowly   Complete by: As directed      Allergies as of 02/07/2019      Reactions   Tape Rash, Other (See Comments)   NO Silk tape, please!!      Medication List    STOP taking these medications   aspirin EC 81 MG tablet   naproxen 500 MG tablet Commonly known as: Naprosyn   NIFEdipine 30 MG 24 hr tablet Commonly known as: PROCARDIA-XL/NIFEDICAL-XL   omeprazole 20 MG capsule Commonly known as: PRILOSEC   sodium chloride 0.65 % Soln nasal spray Commonly known as: OCEAN     TAKE these medications   albuterol 108 (90 Base) MCG/ACT inhaler Commonly known as: VENTOLIN HFA Inhale 1-2 puffs into the lungs every 6 (six) hours as needed for wheezing or shortness of breath.   citalopram 40 MG tablet Commonly known as: CELEXA Take 20 mg by mouth 2 (two) times daily.   cyclobenzaprine 10 MG tablet Commonly known as: FLEXERIL Take 1 tablet (10 mg total) by mouth 2 (two) times daily as needed for muscle spasms.   diphenhydramine-acetaminophen 25-500 MG Tabs tablet Commonly known as: TYLENOL PM Take 1 tablet by mouth at bedtime as needed (for pain or sleep).   ferrous sulfate 325 (65 FE) MG EC tablet Take 1 tablet (325 mg total) by mouth 2 (two) times daily.   lidocaine 5 % Commonly known as: Lidoderm Place 1 patch onto the skin daily. Remove & Discard patch within 12 hours or as directed by MD What changed:   when to take this  reasons to take this   lisdexamfetamine 20 MG capsule Commonly known as: VYVANSE Take 20 mg by mouth 2 (two) times daily.   metoprolol succinate 25 MG 24 hr tablet Commonly known as:  TOPROL-XL Take 25 mg by mouth 2 (two) times daily.   mycophenolate 250 MG capsule Commonly known as: CELLCEPT Take 250-500 mg by mouth See admin instructions. Take 500 mg by mouth in the morning and 250 mg in the evening   pantoprazole 40 MG tablet Commonly known as: Protonix Take 1 tablet (40 mg total) by mouth 2 (two) times daily.   pregabalin 50 MG capsule Commonly known as: Lyrica Take 1 capsule (50 mg total) by mouth 3 (three) times daily.   rosuvastatin 5 MG tablet Commonly known as: CRESTOR Take 5 mg by mouth at bedtime.   tacrolimus 1 MG capsule Commonly known as: PROGRAF Take 3-4 mg by mouth 2 (two) times daily. 4 mg in the morning and 3 mg in the evening   tadalafil 20 MG tablet Commonly known as: CIALIS Take 20 mg by mouth daily as needed for erectile dysfunction.   tamsulosin 0.4 MG Caps capsule Commonly known as: FLOMAX Take 0.4 mg by mouth daily.   VITAMIN B-12 PO Take 1 tablet by mouth daily.   VITAMIN B-6 PO Take 1 tablet by mouth 3 (three) times  a week.   Zaditor 0.025 % ophthalmic solution Generic drug: ketotifen Place 1 drop into both eyes 2 (two) times daily as needed (for seasonal allergies).   zolpidem 10 MG tablet Commonly known as: AMBIEN Take 5 mg by mouth at bedtime.      Allergies  Allergen Reactions  . Tape Rash and Other (See Comments)    NO Silk tape, please!!      The results of significant diagnostics from this hospitalization (including imaging, microbiology, ancillary and laboratory) are listed below for reference.    Significant Diagnostic Studies: Ct Abdomen Pelvis Wo Contrast  Result Date: 02/04/2019 CLINICAL DATA:  Abdominal pain, prior renal transplant, vomiting and diarrhea EXAM: CT ABDOMEN AND PELVIS WITHOUT CONTRAST TECHNIQUE: Multidetector CT imaging of the abdomen and pelvis was performed following the standard protocol without IV contrast. COMPARISON:  December 28, 2015 FINDINGS: Lower chest: The visualized heart  size within normal limits. Small pericardial effusion/thickening. A small hiatal hernia present. The visualized portions of the lungs are clear. Hepatobiliary: Although limited due to the lack of intravenous contrast, normal in appearance without gross focal abnormality. No evidence of calcified gallstones or biliary ductal dilatation. Pancreas:  Unremarkable.  No surrounding inflammatory changes. Spleen: Normal in size. Although limited due to the lack of intravenous contrast, normal in appearance. Adrenals/Urinary Tract: Both adrenal glands appear normal. There is atrophy of the bilateral native kidneys. A right lower pelvic kidney transplant is visualized. A posterior left bladder wall diverticulum is seen. Stomach/Bowel: The stomach, small bowel, and colon are normal in appearance. Scattered colonic diverticula are noted without diverticulitis. No inflammatory changes or obstructive findings. appendix is normal. Vascular/Lymphatic: There are no enlarged abdominal or pelvic lymph nodes. Dense aortic atherosclerosis is noted throughout. Reproductive: The prostate is unremarkable. Other: No evidence of abdominal wall mass or hernia. Musculoskeletal: No acute or significant osseous findings. IMPRESSION: no acute intra-abdominal or pelvic pathology. Electronically Signed   By: Prudencio Pair M.D.   On: 02/04/2019 19:55   Dg Chest Port 1 View  Result Date: 02/04/2019 CLINICAL DATA:  Hematemesis. Dark stools. Diarrhea.  Ex-smoker. EXAM: PORTABLE CHEST 1 VIEW COMPARISON:  09/21/2018 FINDINGS: Normal sized heart. Clear lungs. The lungs remain hyperexpanded with bilateral upper lobe bullous changes. Unremarkable bones. IMPRESSION: No acute abnormality. Stable changes of COPD. Electronically Signed   By: Claudie Revering M.D.   On: 02/04/2019 18:00    Microbiology: Recent Results (from the past 240 hour(s))  Culture, blood (Routine X 2) w Reflex to ID Panel     Status: None (Preliminary result)   Collection Time:  02/04/19  5:36 PM   Specimen: BLOOD RIGHT ARM  Result Value Ref Range Status   Specimen Description   Final    BLOOD RIGHT ARM Performed at [facility], [address], [place], [place] [address]    Special Requests   Final    BOTTLES DRAWN AEROBIC AND ANAEROBIC Blood Culture adequate volume Performed at [facility] [address] [place], [place] [address]    Culture   Final    NO GROWTH 3 DAYS Performed at [facility], [place] [address], [place], [place] [address]    Report Status PENDING  Incomplete  SARS CORONAVIRUS 2 (TAT 6-24 HRS) Nasopharyngeal Nasopharyngeal Swab     Status: None   Collection Time: 02/04/19  8:22 PM   Specimen: Nasopharyngeal Swab  Result Value Ref Range Status   SARS Coronavirus 2 NEGATIVE NEGATIVE Final  Comment: (NOTE) SARS-CoV-2 target nucleic acids are NOT DETECTED. The SARS-CoV-2 RNA is generally detectable in upper and lower respiratory specimens during the acute phase of infection. Negative results do not preclude SARS-CoV-2 infection, do not rule out co-infections with other pathogens, and should not be used as the sole basis for treatment or other patient management decisions. Negative results must be combined with clinical observations, patient history, and epidemiological information. The expected result is Negative. Fact Sheet for Patients: SugarRoll.be Fact Sheet for Healthcare Providers: https://www.woods-mathews.com/ This test is not yet approved or cleared by the [place] FDA and  has been authorized for detection and/or diagnosis of SARS-CoV-2 by FDA under an Emergency Use Authorization (EUA). This EUA will remain  in effect (meaning this test can be used) for the duration of the COVID-19 declaration under Section 56 4(b)(1) of the Act, 21 U.S.C. section 360bbb-3(b)(1), unless the authorization is terminated or revoked sooner. Performed at [facility], [place] [address], [place], [place] [address]   MRSA PCR Screening     Status: None   Collection Time: 02/04/19 10:00 PM   Specimen: Nasopharyngeal  Result Value Ref Range Status   MRSA by PCR NEGATIVE NEGATIVE Final    Comment:        The GeneXpert MRSA Assay (FDA approved for NASAL specimens only), is one component of a comprehensive MRSA colonization surveillance program. It is not intended to diagnose MRSA infection nor to guide or monitor treatment for MRSA infections. Performed at [facility], [place] [address] [place], [place] [address]   Culture, blood (Routine X 2) w Reflex to ID Panel     Status: None (Preliminary result)   Collection Time: 02/04/19 10:56 PM   Specimen: BLOOD  Result Value Ref Range Status   Specimen Description   Final    BLOOD RIGHT ANTECUBITAL Performed at [facility] [address] [place], [place] [address]    Special Requests   Final    BOTTLES DRAWN AEROBIC ONLY Blood Culture results may not be optimal due to an excessive volume of blood received in culture bottles Performed at [facility] [address] [place], [place] [address]    Culture   Final    NO GROWTH 2 DAYS Performed at [facility] [address] [place],  [address]    Report Status PENDING  Incomplete     Labs: Basic Metabolic Panel: Recent Labs  Lab 02/04/19 1736 02/05/19 0536 02/06/19 0209 02/07/19 0538  NA 140 142 139 138  K 4.4 4.0 4.0 4.0  CL 107 113* 110 107  CO2 21* 23 23 23   GLUCOSE 140* 108* 101* 108*  BUN 51* 43* 20 11  CREATININE 2.25* 1.56* 1.04 0.95  CALCIUM 9.6 8.7* 8.4* 8.8*   Liver Function Tests: Recent Labs  Lab 02/04/19 1736 02/07/19 0538  AST 15 11*  ALT 11 9  ALKPHOS 35* 30*  BILITOT 0.5 0.5  PROT 6.2* 5.4*  ALBUMIN 3.5 3.0*   Recent Labs  Lab 02/04/19 1736  LIPASE 15   No results for input(s): AMMONIA in the last 168 hours. CBC: Recent Labs   Lab 02/04/19 1736  02/05/19 0536 02/05/19 1118 02/06/19 0209 02/06/19 1357 02/07/19 0538  WBC 15.8*  --  9.6  --  6.0 5.8 5.0  NEUTROABS  --   --   --   --   --   --  3.6  HGB 9.1*   < >  8.2* 7.8* 7.1* 7.5* 7.4*  HCT 28.7*   < > 26.0* 25.2* 22.8* 24.4* 23.5*  MCV 93.5  --  93.2  --  95.0 95.7 94.0  PLT 200  --  124*  --  113* 114* 122*   < > = values in this interval not displayed.   Cardiac Enzymes: No results for input(s): CKTOTAL, CKMB, CKMBINDEX, TROPONINI in the last 168 hours. BNP: BNP (last 3 results) No results for input(s): BNP in the last 8760 hours.  ProBNP (last 3 results) No results for input(s): PROBNP in the last 8760 hours.  CBG: Recent Labs  Lab 02/06/19 1533  GLUCAP 112*       Signed:  Nita Sells MD   [facility] 02/07/2019, 12:09 PM

## 2019-02-07 NOTE — Progress Notes (Signed)
Scott Chavez to be D/C'd Home per MD order.  Discussed prescriptions and follow up appointments with the patient. Prescriptions given to patient, medication list explained in detail. Pt verbalized understanding.  Allergies as of 02/07/2019      Reactions   Tape Rash, Other (See Comments)   NO Silk tape, please!!      Medication List    STOP taking these medications   aspirin EC 81 MG tablet   naproxen 500 MG tablet Commonly known as: Naprosyn   NIFEdipine 30 MG 24 hr tablet Commonly known as: PROCARDIA-XL/NIFEDICAL-XL   omeprazole 20 MG capsule Commonly known as: PRILOSEC   sodium chloride 0.65 % Soln nasal spray Commonly known as: OCEAN     TAKE these medications   albuterol 108 (90 Base) MCG/ACT inhaler Commonly known as: VENTOLIN HFA Inhale 1-2 puffs into the lungs every 6 (six) hours as needed for wheezing or shortness of breath.   citalopram 40 MG tablet Commonly known as: CELEXA Take 20 mg by mouth 2 (two) times daily.   cyclobenzaprine 10 MG tablet Commonly known as: FLEXERIL Take 1 tablet (10 mg total) by mouth 2 (two) times daily as needed for muscle spasms.   diphenhydramine-acetaminophen 25-500 MG Tabs tablet Commonly known as: TYLENOL PM Take 1 tablet by mouth at bedtime as needed (for pain or sleep).   ferrous sulfate 325 (65 FE) MG EC tablet Take 1 tablet (325 mg total) by mouth 2 (two) times daily.   lidocaine 5 % Commonly known as: Lidoderm Place 1 patch onto the skin daily. Remove & Discard patch within 12 hours or as directed by MD What changed:   when to take this  reasons to take this   lisdexamfetamine 20 MG capsule Commonly known as: VYVANSE Take 20 mg by mouth 2 (two) times daily.   metoprolol succinate 25 MG 24 hr tablet Commonly known as: TOPROL-XL Take 25 mg by mouth 2 (two) times daily.   mycophenolate 250 MG capsule Commonly known as: CELLCEPT Take 250-500 mg by mouth See admin instructions. Take 500 mg by mouth in the  morning and 250 mg in the evening   pantoprazole 40 MG tablet Commonly known as: Protonix Take 1 tablet (40 mg total) by mouth 2 (two) times daily.   pregabalin 50 MG capsule Commonly known as: Lyrica Take 1 capsule (50 mg total) by mouth 3 (three) times daily.   rosuvastatin 5 MG tablet Commonly known as: CRESTOR Take 5 mg by mouth at bedtime.   tacrolimus 1 MG capsule Commonly known as: PROGRAF Take 3-4 mg by mouth 2 (two) times daily. 4 mg in the morning and 3 mg in the evening   tadalafil 20 MG tablet Commonly known as: CIALIS Take 20 mg by mouth daily as needed for erectile dysfunction.   tamsulosin 0.4 MG Caps capsule Commonly known as: FLOMAX Take 0.4 mg by mouth daily.   VITAMIN B-12 PO Take 1 tablet by mouth daily.   VITAMIN B-6 PO Take 1 tablet by mouth 3 (three) times a week.   Zaditor 0.025 % ophthalmic solution Generic drug: ketotifen Place 1 drop into both eyes 2 (two) times daily as needed (for seasonal allergies).   zolpidem 10 MG tablet Commonly known as: AMBIEN Take 5 mg by mouth at bedtime.       Vitals:   02/06/19 2122 02/07/19 0543  BP: (!) 146/79 122/67  Pulse: 68 (!) 58  Resp: 16 16  Temp: 98.2 F (36.8 C) 98.5 F (  36.9 C)  SpO2: 100% 100%    Skin clean, dry and intact without evidence of skin break down, no evidence of skin tears noted. IV catheter discontinued intact. Site without signs and symptoms of complications. Dressing and pressure applied. Pt denies pain at this time. No complaints noted.  An After Visit Summary was printed and given to the patient. Patient escorted via [place], and D/C home via private auto.  Scott Chavez S 02/07/2019 12:15 PM

## 2019-02-09 LAB — TYPE AND SCREEN
ABO/RH(D): O POS
Antibody Screen: NEGATIVE
Unit division: 0
Unit division: 0

## 2019-02-09 LAB — BPAM RBC
Blood Product Expiration Date: 202011042359
Blood Product Expiration Date: 202011042359
ISSUE DATE / TIME: 202010042345
ISSUE DATE / TIME: 202010081208
Unit Type and Rh: 5100
Unit Type and Rh: 5100

## 2019-02-09 LAB — CULTURE, BLOOD (ROUTINE X 2)
Culture: NO GROWTH
Special Requests: ADEQUATE

## 2019-02-10 LAB — CULTURE, BLOOD (ROUTINE X 2): Culture: NO GROWTH

## 2019-02-26 ENCOUNTER — Encounter (INDEPENDENT_AMBULATORY_CARE_PROVIDER_SITE_OTHER): Payer: Self-pay

## 2019-03-09 DIAGNOSIS — N319 Neuromuscular dysfunction of bladder, unspecified: Secondary | ICD-10-CM | POA: Diagnosis not present

## 2019-03-09 DIAGNOSIS — D649 Anemia, unspecified: Secondary | ICD-10-CM | POA: Diagnosis not present

## 2019-03-09 DIAGNOSIS — I129 Hypertensive chronic kidney disease with stage 1 through stage 4 chronic kidney disease, or unspecified chronic kidney disease: Secondary | ICD-10-CM | POA: Diagnosis not present

## 2019-03-09 DIAGNOSIS — Z940 Kidney transplant status: Secondary | ICD-10-CM | POA: Diagnosis not present

## 2019-03-09 DIAGNOSIS — E7800 Pure hypercholesterolemia, unspecified: Secondary | ICD-10-CM | POA: Diagnosis not present

## 2019-03-16 ENCOUNTER — Ambulatory Visit (INDEPENDENT_AMBULATORY_CARE_PROVIDER_SITE_OTHER): Payer: Medicare Other

## 2019-03-16 ENCOUNTER — Other Ambulatory Visit: Payer: Medicare Other

## 2019-03-16 ENCOUNTER — Other Ambulatory Visit: Payer: Self-pay

## 2019-03-16 DIAGNOSIS — I6523 Occlusion and stenosis of bilateral carotid arteries: Secondary | ICD-10-CM | POA: Diagnosis not present

## 2019-03-18 ENCOUNTER — Other Ambulatory Visit: Payer: Self-pay | Admitting: Cardiology

## 2019-03-18 DIAGNOSIS — I6523 Occlusion and stenosis of bilateral carotid arteries: Secondary | ICD-10-CM

## 2019-04-20 ENCOUNTER — Other Ambulatory Visit: Payer: Self-pay

## 2019-04-20 DIAGNOSIS — I6523 Occlusion and stenosis of bilateral carotid arteries: Secondary | ICD-10-CM

## 2019-04-21 ENCOUNTER — Emergency Department (HOSPITAL_COMMUNITY)
Admission: EM | Admit: 2019-04-21 | Discharge: 2019-04-21 | Disposition: A | Discharge status: Home / Self Care | Payer: Medicare Other | Attending: Emergency Medicine | Admitting: Emergency Medicine

## 2019-04-21 ENCOUNTER — Other Ambulatory Visit: Payer: Self-pay

## 2019-04-21 ENCOUNTER — Encounter (HOSPITAL_COMMUNITY): Payer: Self-pay

## 2019-04-21 ENCOUNTER — Emergency Department (HOSPITAL_COMMUNITY): Payer: Medicare Other

## 2019-04-21 DIAGNOSIS — I120 Hypertensive chronic kidney disease with stage 5 chronic kidney disease or end stage renal disease: Secondary | ICD-10-CM | POA: Insufficient documentation

## 2019-04-21 DIAGNOSIS — W010XXA Fall on same level from slipping, tripping and stumbling without subsequent striking against object, initial encounter: Secondary | ICD-10-CM | POA: Diagnosis not present

## 2019-04-21 DIAGNOSIS — Z87891 Personal history of nicotine dependence: Secondary | ICD-10-CM | POA: Insufficient documentation

## 2019-04-21 DIAGNOSIS — Y999 Unspecified external cause status: Secondary | ICD-10-CM | POA: Diagnosis not present

## 2019-04-21 DIAGNOSIS — Z79899 Other long term (current) drug therapy: Secondary | ICD-10-CM | POA: Insufficient documentation

## 2019-04-21 DIAGNOSIS — Y93K1 Activity, walking an animal: Secondary | ICD-10-CM | POA: Insufficient documentation

## 2019-04-21 DIAGNOSIS — S83422A Sprain of lateral collateral ligament of left knee, initial encounter: Secondary | ICD-10-CM | POA: Diagnosis not present

## 2019-04-21 DIAGNOSIS — N186 End stage renal disease: Secondary | ICD-10-CM | POA: Diagnosis not present

## 2019-04-21 DIAGNOSIS — Y929 Unspecified place or not applicable: Secondary | ICD-10-CM | POA: Insufficient documentation

## 2019-04-21 DIAGNOSIS — M25562 Pain in left knee: Secondary | ICD-10-CM | POA: Diagnosis not present

## 2019-04-21 DIAGNOSIS — S8992XA Unspecified injury of left lower leg, initial encounter: Secondary | ICD-10-CM | POA: Diagnosis not present

## 2019-04-21 MED ORDER — HYDROCODONE-ACETAMINOPHEN 5-325 MG PO TABS
1.0000 | ORAL_TABLET | Freq: Once | ORAL | Status: AC
  Administered 2019-04-21: 21:00:00 1 via ORAL
  Filled 2019-04-21: qty 1

## 2019-04-21 MED ORDER — HYDROCODONE-ACETAMINOPHEN 5-325 MG PO TABS: 1.0000 | ORAL_TABLET | Freq: Four times a day (QID) | ORAL | 0 refills | Status: AC | PRN

## 2019-04-21 NOTE — ED Triage Notes (Signed)
Pt presents with c/o left knee pain. Pt states he was walking his dog when he slipped on some leaves causing him to fall back and twist his left knee. Pt is ambulatory, states he has pain when bearing weight on left leg.

## 2019-04-21 NOTE — Discharge Instructions (Signed)
You are seen today for a knee injury.  Your x-ray showed that the bones were normal.  However, I think that you have injured one of the ligaments.  We have placed you in a knee immobilizer.  We do not have walkers in the hospital so we have given you crutches.  It is best to use crutches to avoid any weight on the left leg.  However, I will also write your prescription for a walker if you feel more comfortable using this you can fill the prescription tomorrow.  Follow-up with orthopedics.  The medication that was prescribed make you sleepy or drowsy so please do not drive with this medication.

## 2019-04-21 NOTE — ED Provider Notes (Signed)
[facility] DEPT Provider Note   CSN: [id number] Arrival date & time: 04/21/19  4562     History Chief Complaint  Patient presents with  . Knee Pain    Scott Chavez is a 63 y.o. male.  63 y/o male with ADHD, GERD, HTN, ESRD presenting to the emergency department for left knee pain.  Patient reports that today he was walking his dog when the dog pulled on him and he tried to stop the dog but this caused his left knee to twist and to fall on his knee.  He has been able to ambulate but with significant medial left knee pain.  Did not hit his head or pass out.  No other injuries.        Past Medical History:  Diagnosis Date  . ADHD   . Anemia    pt. not sure, but reports he is taking Fe for one month now.  . Aorto-iliac disease ([place])   . Bladder dysfunction   . ESRD (end stage renal disease) [facility])    transplant- [facility]/2010[facility] , followed by Dr. Moshe Cipro   . GERD (gastroesophageal reflux disease)   . Hemodialysis patient [facility])    "on dialysis 1998-2010" 04/25/2013)  . Hepatitis C    has been treated with Harvoni  . History of renal failure   . Hypercholesteremia   . Hypertension   . Internal hemorrhoids    Colonoscopy 2010 and anoscopy 2015  . Peripheral vascular disease ([place])   . Pneumonia   . Self-catheterizes urinary bladder    pt. choice, but he reports that he desires to do this to avoid retention     Patient Active Problem List   Diagnosis Date Noted  . Acute upper GI bleeding 02/05/2019  . GI bleeding 02/04/2019  . AKI (acute kidney injury) ([place]) 02/04/2019  . Iron deficiency anemia 04/06/2017  . Peripheral arterial disease ([place]) 10/25/2016  . Chronic neck pain 03/15/2016  . Chronic low back pain without sciatica 03/15/2016  . Nonallopathic lesion of lumbosacral region 03/15/2016  . Nonallopathic lesion of sacral region 03/15/2016  . Nonallopathic lesion of thoracic region 03/15/2016  . Liver fibrosis  12/18/2015  . Arteriovenous fistula thrombosis ([place]) 10/16/2015  . Chronic hepatitis C without hepatic coma ([place]) 09/03/2015  . Atherosclerosis of native arteries of extremities with intermittent claudication, right leg ([place]) 04/17/2015  . Pain in joint, lower leg 09/13/2014  . Hemorrhoids, internal, with bleeding 01/29/2014  . Pain in limb 08/17/2013  . Peripheral vascular disease, unspecified ([place]) 06/08/2013  . Redness of skin-Calf to foot LEFT 05/25/2013  . Tenderness in limb-Calf to foot LEFT 05/25/2013  . Swelling of limb-Calf to foot LEFT 05/25/2013  . Postoperative pain 05/02/2013  . PAD (peripheral artery disease) ([place]) 04/24/2013  . Preop cardiovascular exam 03/27/2013  . Essential hypertension 03/27/2013  . Hyperlipidemia 03/27/2013  . Atherosclerosis of native arteries of extremity with intermittent claudication ([place]) 02/16/2013  . History of renal transplant 02/16/2013  . Neurogenic dysfunction of the urinary bladder 05/13/2011  . Deceased-donor kidney transplant recipient May 13, 2011    Past Surgical History:  Procedure Laterality Date  . ABDOMINAL AORTAGRAM N/A 03/09/2013   Procedure: ABDOMINAL Maxcine Ham;  Surgeon: Elam Dutch, MD;  Location: [facility] CATH LAB;  Service: Cardiovascular;  Laterality: N/A;  . AV FISTULA PLACEMENT Left 1998   "removed 2011" (04/25/2013)  . AV FISTULA REPAIR Left    removal with partial vein removed  . COLONOSCOPY    . ENDARTERECTOMY  FEMORAL Right 04/17/2015   Procedure: ENDARTERECTOMY RIGHT ILIOFEMORAL WITH PROFUNDOPLASTY;  Surgeon: Conrad Bull Shoals, MD;  Location: [place] [facility];  Service: Vascular;  Laterality: Right;  . ENDARTERECTOMY FEMORAL Right 10/25/2016   Procedure: RIGHT ILIOFEMORAL ENDARTERECTOMY WITH BOVINE PERICARDIAL PATCH ANGIOPLASTY;  Surgeon: Conrad Oxford, MD;  Location: [facility];  Service: Vascular;  Laterality: Right;  . ESOPHAGOGASTRODUODENOSCOPY (EGD) WITH PROPOFOL N/A 02/05/2019   Procedure: ESOPHAGOGASTRODUODENOSCOPY (EGD) WITH  PROPOFOL;  Surgeon: Clarene Essex, MD;  Location: [facility] ENDOSCOPY;  Service: Endoscopy;  Laterality: N/A;  . FEMORAL-POPLITEAL BYPASS GRAFT Left 04/24/2013   Procedure: BYPASS GRAFT COMMON FEMORALARTERY-BELOW KNEE POPLITEAL ARTERY WITH GREATER SAPHENOUS VEIN;  Surgeon: Conrad Twain Harte, MD;  Location: [place];  Service: Vascular;  Laterality: Left;  . FEMORAL-POPLITEAL BYPASS GRAFT Right 04/17/2015   Procedure: BYPASS GRAFT RIGHT FEMORALTO BELOW KNEE POPLITEAL ARTERY USING RIGHT NON REVERSED GREATER SAPPHENOUS VEIN;  Surgeon: Conrad Crothersville, MD;  Location: [place];  Service: Vascular;  Laterality: Right;  . INTRAOPERATIVE ARTERIOGRAM Left 04/24/2013   Procedure: INTRA OPERATIVE ARTERIOGRAM;  Surgeon: Conrad Lockport, MD;  Location: [place];  Service: Vascular;  Laterality: Left;  . KIDNEY TRANSPLANT    . LOWER EXTREMITY ANGIOGRAM Bilateral 03/09/2013   Procedure: LOWER EXTREMITY ANGIOGRAM;  Surgeon: Elam Dutch, MD;  Location: [facility] CATH LAB;  Service: Cardiovascular;  Laterality: Bilateral;  . LOWER EXTREMITY ANGIOGRAM Right 10/25/2013   Procedure: LOWER EXTREMITY ANGIOGRAM;  Surgeon: Conrad Bowdon, MD;  Location: [facility] CATH LAB;  Service: Cardiovascular;  Laterality: Right;  . LOWER EXTREMITY ANGIOGRAM Right 12/13/2013   Procedure: LOWER EXTREMITY ANGIOGRAM;  Surgeon: Conrad Nicollet, MD;  Location: [facility] CATH LAB;  Service: Cardiovascular;  Laterality: Right;  . NEPHRECTOMY RECIPIENT  2010  . NEPHRECTOMY TRANSPLANTED ORGAN    . PATCH ANGIOPLASTY Right 04/17/2015   Procedure: Rueben Bash PATCH ANGIOPLASTY, RIGHT ILEOFEMORAL;  Surgeon: Conrad Hollywood Park, MD;  Location: [place];  Service: Vascular;  Laterality: Right;  . PERIPHERAL VASCULAR CATHETERIZATION N/A 04/10/2015   Procedure: Abdominal Aortogram;  Surgeon: Conrad South Gate, MD;  Location: [place] CV LAB;  Service: Cardiovascular;  Laterality: N/A;  . PERIPHERAL VASCULAR CATHETERIZATION N/A 05/10/2016   Procedure: Abdominal Aortogram w/Lower Extremity;  Surgeon: Conrad Rossburg, MD;   Location: [place] CV LAB;  Service: Cardiovascular;  Laterality: N/A;  . s/p cadaveric renal transplant  December 2010   [place] Left 09/24/2015   Procedure: EXCISION OF THROMBOSED RADIOCEPHALIC ARTERIOVENOUS FISTULA;  Surgeon: Conrad Somers, MD;  Location: [place];  Service: Vascular;  Laterality: Left;  Marland Kitchen VEIN HARVEST Right 04/17/2015   Procedure: RIGHT GREATER Jennings ;  Surgeon: Conrad Du Quoin, MD;  Location: [place];  Service: Vascular;  Laterality: Right;       Family History  Problem Relation Age of Onset  . Hypertension Father   . Other Father        amputation  . Hypertension Mother     Social History   Tobacco Use  . Smoking status: Former Smoker    Packs/day: 0.50    Years: 34.00    Pack years: 17.00    Types: E-cigarettes    Quit date: 02/17/2003    Years since quitting: 16.1  . Smokeless tobacco: Former Systems developer  . Tobacco comment: vapor cigrarette  Substance Use Topics  . Alcohol use: No    Alcohol/week: 0.0 standard drinks  . Drug use: No    Types: Cocaine, Marijuana, Heroin  Comment: 04/25/2013 "abused in the past ; stopped  11/13/2002    Home Medications Prior to Admission medications   Medication Sig Start Date End Date Taking? Authorizing Provider  albuterol (PROVENTIL HFA;VENTOLIN HFA) 108 (90 Base) MCG/ACT inhaler Inhale 1-2 puffs into the lungs every 6 (six) hours as needed for wheezing or shortness of breath. 07/17/18   Rancour, Annie Main, MD  citalopram (CELEXA) 40 MG tablet Take 20 mg by mouth 2 (two) times daily.  02/18/17   [provider]  Cyanocobalamin (VITAMIN B-12 PO) Take 1 tablet by mouth daily.    [provider]  cyclobenzaprine (FLEXERIL) 10 MG tablet Take 1 tablet (10 mg total) by mouth 2 (two) times daily as needed for muscle spasms. 09/21/18   Gareth Morgan, MD  diphenhydramine-acetaminophen (TYLENOL PM) 25-500 MG TABS tablet Take 1 tablet by mouth at bedtime as needed  (for pain or sleep).     [provider]  ferrous sulfate 325 (65 FE) MG EC tablet Take 1 tablet (325 mg total) by mouth 2 (two) times daily. 02/07/19   Nita Sells, MD  HYDROcodone-acetaminophen (NORCO/VICODIN) 5-325 MG tablet Take 1 tablet by mouth every 6 (six) hours as needed for up to 2 days for severe pain. 04/21/19 04/23/19  Alveria Apley, PA-C  ketotifen (ZADITOR) 0.025 % ophthalmic solution Place 1 drop into both eyes 2 (two) times daily as needed (for seasonal allergies).    [provider]  lidocaine (LIDODERM) 5 % Place 1 patch onto the skin daily. Remove & Discard patch within 12 hours or as directed by MD Patient taking differently: Place 1 patch onto the skin daily as needed (pain). Remove & Discard patch within 12 hours or as directed by MD 11/19/16   Conrad Hopewell, MD  lisdexamfetamine (VYVANSE) 20 MG capsule Take 20 mg by mouth 2 (two) times daily.     [provider]  metoprolol succinate (TOPROL-XL) 25 MG 24 hr tablet Take 25 mg by mouth 2 (two) times daily.  08/16/17   [provider]  mycophenolate (CELLCEPT) 250 MG capsule Take 250-500 mg by mouth See admin instructions. Take 500 mg by mouth in the morning and 250 mg in the evening    [provider]  pantoprazole (PROTONIX) 40 MG tablet Take 1 tablet (40 mg total) by mouth 2 (two) times daily. 02/07/19 02/07/20  Nita Sells, MD  pregabalin (LYRICA) 50 MG capsule Take 1 capsule (50 mg total) by mouth 3 (three) times daily. 01/08/16   Conrad Elgin, MD  Pyridoxine HCl (VITAMIN B-6 PO) Take 1 tablet by mouth 3 (three) times a week.     [provider]  rosuvastatin (CRESTOR) 5 MG tablet Take 5 mg by mouth at bedtime.     [provider]  tacrolimus (PROGRAF) 1 MG capsule Take 3-4 mg by mouth 2 (two) times daily. 4 mg in the morning and 3 mg in the evening    [provider]  tadalafil (CIALIS) 20 MG tablet Take 20 mg by mouth daily as needed for  erectile dysfunction.    [provider]  tamsulosin (FLOMAX) 0.4 MG CAPS capsule Take 0.4 mg by mouth daily.    [provider]  zolpidem (AMBIEN) 10 MG tablet Take 5 mg by mouth at bedtime.     [provider]    Allergies    Tape  Review of Systems   Review of Systems  Constitutional: Negative for chills and fever.  Musculoskeletal: Positive for  arthralgias and gait problem. Negative for back pain, joint swelling, myalgias, neck pain and neck stiffness.  Skin: Negative for wound.  Neurological: Negative for dizziness, light-headedness and headaches.    Physical Exam Updated Vital Signs BP 140/72   Pulse 84   Temp 99.2 F (37.3 C) (Oral)   Resp 20   SpO2 100%   Physical Exam Vitals and nursing note reviewed.  Constitutional:      General: He is not in acute distress.    Appearance: Normal appearance. He is not ill-appearing, toxic-appearing or diaphoretic.  HENT:     Head: Normocephalic.  Eyes:     Conjunctiva/sclera: Conjunctivae normal.  Pulmonary:     Effort: Pulmonary effort is normal.  Musculoskeletal:     Comments: Patient has significant medial left knee tenderness.  Decreased range of motion secondary to pain.  Able to ambulate with a limp.  Difficult to assess ligamentous laxity due to pain.  Normal distal pulses and sensation.  Skin:    General: Skin is dry.  Neurological:     Mental Status: He is alert.  Psychiatric:        Mood and Affect: Mood normal.     ED Results / Procedures / Treatments   Labs (all labs ordered are listed, but only abnormal results are displayed) Labs Reviewed - No data to display  EKG None  Radiology DG Knee Complete 4 Views Left  Result Date: 04/21/2019 CLINICAL DATA:  Fall and knee pain EXAM: LEFT KNEE - COMPLETE 4+ VIEW COMPARISON:  None. FINDINGS: No evidence of fracture, dislocation, or joint effusion. Mild medial compartment osteoarthritis is seen with subchondral sclerosis. Dense  vascular calcifications and surgical clips over the posterior knee. IMPRESSION: No acute osseous abnormality. Electronically Signed   By: Prudencio Pair M.D.   On: 04/21/2019 19:30    Procedures Procedures (including critical care time)  Medications Ordered in ED Medications  HYDROcodone-acetaminophen (NORCO/VICODIN) 5-325 MG per tablet 1 tablet (1 tablet Oral Given 04/21/19 2049)    ED Course  I have reviewed the triage vital signs and the nursing notes.  Pertinent labs & imaging results that were available during my care of the patient were reviewed by me and considered in my medical decision making (see chart for details).  Clinical Course as of Apr 20 2121  Sat Apr 21, 2019  2102 Patient presenting with twisting left knee injury prior to arrival.  Able to ambulate but needs significant assistance.  X-ray is normal.  Suspect may be lateral ligament injury.  Will place a knee immobilizer.  Discussed with patient that it is best to use crutches and stay off of the leg entirely.  Patient wishes to use a walker instead.  Will have patient follow-up with orthopedics.  Given hydrocodone here in the emergency department.   [KM]  2108 I discussed treatment options for the patients pain to include narcotic and non-narcotic medications. I discussed the risks of narcotic medications in full detail to include overdose and addiction. Patient verbalized full understanding of risks of narcotic medication but still insisted to take rx for narcotic pain medication due to the severity of their pain.  Prior to providing a prescription for a controlled substance, I independently reviewed the patient's recent prescription history on the [facility]. The patient had no recent or regular prescriptions and was deemed appropriate for a brief, less than 3 day prescription of narcotic for acute analgesia.     [KM]    Clinical  Course User Index [KM] Kristine Royal    MDM Rules/Calculators/A&P                      Based on review of vitals, medical screening exam, lab work and/or imaging, there does not appear to be an acute, emergent etiology for the patient's symptoms. Counseled pt on good return precautions and encouraged both PCP and ED follow-up as needed.  Prior to discharge, I also discussed incidental imaging findings with patient in detail and advised appropriate, recommended follow-up in detail.  Clinical Impression: 1. Sprain of lateral collateral ligament of left knee, initial encounter     Disposition: Discharge  Prior to providing a prescription for a controlled substance, I independently reviewed the patient's recent prescription history on the [facility]. The patient had no recent or regular prescriptions and was deemed appropriate for a brief, less than 3 day prescription of narcotic for acute analgesia.  This note was prepared with assistance of Systems analyst. Occasional wrong-word or sound-a-like substitutions may have occurred due to the inherent limitations of voice recognition software.  Final Clinical Impression(s) / ED Diagnoses Final diagnoses:  Sprain of lateral collateral ligament of left knee, initial encounter    Rx / DC Orders ED Discharge Orders         Ordered    HYDROcodone-acetaminophen (NORCO/VICODIN) 5-325 MG tablet  Every 6 hours PRN     04/21/19 2121    For home use only DME 4 wheeled rolling walker with seat     04/21/19 2121           Kristine Royal 04/21/19 2122    Blanchie Dessert, MD 04/21/19 [phone]

## 2019-04-21 NOTE — ED Notes (Signed)
An After Visit Summary was printed and given to the patient. Discharge instructions given and no further questions at this time. Pt states friend is coming to take him home.

## 2019-04-26 ENCOUNTER — Emergency Department (HOSPITAL_COMMUNITY)
Admission: EM | Admit: 2019-04-26 | Discharge: 2019-04-26 | Disposition: A | Discharge status: Home / Self Care | Payer: Medicare Other | Attending: Emergency Medicine | Admitting: Emergency Medicine

## 2019-04-26 ENCOUNTER — Other Ambulatory Visit: Payer: Self-pay

## 2019-04-26 ENCOUNTER — Encounter (HOSPITAL_COMMUNITY): Payer: Self-pay | Admitting: Emergency Medicine

## 2019-04-26 DIAGNOSIS — S92505D Nondisplaced unspecified fracture of left lesser toe(s), subsequent encounter for fracture with routine healing: Secondary | ICD-10-CM | POA: Diagnosis not present

## 2019-04-26 DIAGNOSIS — Z79899 Other long term (current) drug therapy: Secondary | ICD-10-CM | POA: Insufficient documentation

## 2019-04-26 DIAGNOSIS — Z87891 Personal history of nicotine dependence: Secondary | ICD-10-CM | POA: Diagnosis not present

## 2019-04-26 DIAGNOSIS — Z940 Kidney transplant status: Secondary | ICD-10-CM | POA: Insufficient documentation

## 2019-04-26 DIAGNOSIS — I120 Hypertensive chronic kidney disease with stage 5 chronic kidney disease or end stage renal disease: Secondary | ICD-10-CM | POA: Diagnosis not present

## 2019-04-26 DIAGNOSIS — N186 End stage renal disease: Secondary | ICD-10-CM | POA: Diagnosis not present

## 2019-04-26 DIAGNOSIS — M25562 Pain in left knee: Secondary | ICD-10-CM | POA: Diagnosis not present

## 2019-04-26 NOTE — ED Triage Notes (Signed)
Patient reports continued pain to left knee after seen for knee sprain on 12/19. States ran out of pain medication on 12/21. Ambulatory with cane. Knee immobilizer in place.

## 2019-04-26 NOTE — Discharge Instructions (Addendum)
You have been diagnosed today with left knee pain.  At this time there does not appear to be the presence of an emergent medical condition, however there is always the potential for conditions to change. Please read and follow the below instructions.  Please return to the Emergency Department immediately for any new or worsening symptoms. Please be sure to follow up with your Primary Care Provider within one week regarding your visit today; please call their office to schedule an appointment even if you are feeling better for a follow-up visit. Please call the orthopedic specialist Dr. Renda Rolls on your discharge paperwork to schedule a follow-up appointment regarding her left knee pain.  Please continue to use crutches and your knee brace to protect your knee from further injury.  You may use over-the-counter anti-inflammatories as directed on the packaging to help with your symptoms.  Please use rest, ice and elevation to help with pain.   Get help right away if: Your knee swells, and the swelling gets worse. You cannot move your knee. You have very bad knee pain. You have fever or chills You have any new/concerning or worsening symptoms.  Please read the additional information packets attached to your discharge summary.  Do not take your medicine if  develop an itchy rash, swelling in your mouth or lips, or difficulty breathing; call 911 and seek immediate emergency medical attention if this occurs.  Note: Portions of this text may have been transcribed using voice recognition software. Every effort was made to ensure accuracy; however, inadvertent computerized transcription errors may still be present.

## 2019-04-26 NOTE — ED Notes (Signed)
Patient not found in room at time of attempted discharge. Last seen ambulating out of department stating, "this is taking too long, I am going to take a smoke."

## 2019-04-26 NOTE — ED Provider Notes (Addendum)
[facility] DEPT Provider Note   CSN: [id number] Arrival date & time: 04/26/19  1150     History Chief Complaint  Patient presents with  . Knee Pain    Scott Chavez is a 63 y.o. male history of GERD, ESRD with transplant, hypertension.  Patient presents today for follow-up regarding his left knee pain.  Patient was seen in this ER on 04/21/2019 after his dog pulled him forward causing him to twist his left knee.  Patient had x-ray performed at that time:  DG Knee Complete 4 Views Left  Result Date: 04/21/2019 CLINICAL DATA:  Fall and knee pain EXAM: LEFT KNEE - COMPLETE 4+ VIEW COMPARISON:  None. FINDINGS: No evidence of fracture, dislocation, or joint effusion. Mild medial compartment osteoarthritis is seen with subchondral sclerosis. Dense vascular calcifications and surgical clips over the posterior knee. IMPRESSION: No acute osseous abnormality.   He was diagnosed with sprain of the lateral collateral ligament of the knee, given pain medication, crutches and knee brace and encouraged to follow-up with orthopedics.  Patient reports to me that he returned today as instructed by his discharge paperwork for follow-up appointment here at [facility] long emergency department.  I looked at patient's AVS and explained that it was with the orthopedist Dr. Griffin Basil who is to follow-up with and that he was only to return to the ER for any new or worsening of symptoms.  Patient reports that his symptoms have been continually improving since discharge and that it was only due to the misunderstanding on discharge paperwork that he came to this ER for evaluation.  Patient denies fever/chills, headache, neck pain, chest pain, abdominal pain, swelling, numbness/tingling, weakness, worsening of left knee pain or any additional concerns.    HPI     Past Medical History:  Diagnosis Date  . ADHD   . Anemia    pt. not sure, but reports he is taking Fe for one month  now.  . Aorto-iliac disease ([place])   . Bladder dysfunction   . ESRD (end stage renal disease) [facility])    transplant- [facility] , followed by Dr. Moshe Cipro   . GERD (gastroesophageal reflux disease)   . Hemodialysis patient [facility])    "on dialysis 1998-2010" 04/25/2013)  . Hepatitis C    has been treated with Harvoni  . History of renal failure   . Hypercholesteremia   . Hypertension   . Internal hemorrhoids    Colonoscopy 2010 and anoscopy 2015  . Peripheral vascular disease ([place])   . Pneumonia   . Self-catheterizes urinary bladder    pt. choice, but he reports that he desires to do this to avoid retention     Patient Active Problem List   Diagnosis Date Noted  . Acute upper GI bleeding 02/05/2019  . GI bleeding 02/04/2019  . AKI (acute kidney injury) ([place]) 02/04/2019  . Iron deficiency anemia 04/06/2017  . Peripheral arterial disease ([place]) 10/25/2016  . Chronic neck pain 03/15/2016  . Chronic low back pain without sciatica 03/15/2016  . Nonallopathic lesion of lumbosacral region 03/15/2016  . Nonallopathic lesion of sacral region 03/15/2016  . Nonallopathic lesion of thoracic region 03/15/2016  . Liver fibrosis 12/18/2015  . Arteriovenous fistula thrombosis ([place]) 10/16/2015  . Chronic hepatitis C without hepatic coma ([place]) 09/03/2015  . Atherosclerosis of native arteries of extremities with intermittent claudication, right leg ([place]) 04/17/2015  . Pain in joint, lower leg 09/13/2014  . Hemorrhoids, internal, with bleeding 01/29/2014  . Pain in  limb 08/17/2013  . Peripheral vascular disease, unspecified ([place]) 06/08/2013  . Redness of skin-Calf to foot LEFT 05/25/2013  . Tenderness in limb-Calf to foot LEFT 05/25/2013  . Swelling of limb-Calf to foot LEFT 05/25/2013  . Postoperative pain 05/02/2013  . PAD (peripheral artery disease) ([place]) 04/24/2013  . Preop cardiovascular exam 03/27/2013  . Essential hypertension 03/27/2013  . Hyperlipidemia 03/27/2013  .  Atherosclerosis of native arteries of extremity with intermittent claudication ([place]) 02/16/2013  . History of renal transplant 02/16/2013  . Neurogenic dysfunction of the urinary bladder 05-09-11  . Deceased-donor kidney transplant recipient May 09, 2011    Past Surgical History:  Procedure Laterality Date  . ABDOMINAL AORTAGRAM N/A 03/09/2013   Procedure: ABDOMINAL Maxcine Ham;  Surgeon: Elam Dutch, MD;  Location: [facility] CATH LAB;  Service: Cardiovascular;  Laterality: N/A;  . AV FISTULA PLACEMENT Left 1998   "removed 2011" (04/25/2013)  . AV FISTULA REPAIR Left    removal with partial vein removed  . COLONOSCOPY    . ENDARTERECTOMY FEMORAL Right 04/17/2015   Procedure: ENDARTERECTOMY RIGHT ILIOFEMORAL WITH PROFUNDOPLASTY;  Surgeon: Conrad Somerdale, MD;  Location: [facility];  Service: Vascular;  Laterality: Right;  . ENDARTERECTOMY FEMORAL Right 10/25/2016   Procedure: RIGHT ILIOFEMORAL ENDARTERECTOMY WITH BOVINE PERICARDIAL PATCH ANGIOPLASTY;  Surgeon: Conrad Eloy, MD;  Location: [facility];  Service: Vascular;  Laterality: Right;  . ESOPHAGOGASTRODUODENOSCOPY (EGD) WITH PROPOFOL N/A 02/05/2019   Procedure: ESOPHAGOGASTRODUODENOSCOPY (EGD) WITH PROPOFOL;  Surgeon: Clarene Essex, MD;  Location: [facility] ENDOSCOPY;  Service: Endoscopy;  Laterality: N/A;  . FEMORAL-POPLITEAL BYPASS GRAFT Left 04/24/2013   Procedure: BYPASS GRAFT COMMON FEMORALARTERY-BELOW KNEE POPLITEAL ARTERY WITH GREATER SAPHENOUS VEIN;  Surgeon: Conrad [place], MD;  Location: [place];  Service: Vascular;  Laterality: Left;  . FEMORAL-POPLITEAL BYPASS GRAFT Right 04/17/2015   Procedure: BYPASS GRAFT RIGHT FEMORALTO BELOW KNEE POPLITEAL ARTERY USING RIGHT NON REVERSED GREATER SAPPHENOUS VEIN;  Surgeon: Conrad Thurston, MD;  Location: [facility] [place];  Service: Vascular;  Laterality: Right;  . INTRAOPERATIVE ARTERIOGRAM Left 04/24/2013   Procedure: INTRA OPERATIVE ARTERIOGRAM;  Surgeon: Conrad [place], MD;  Location: [place];  Service: Vascular;  Laterality: Left;   . KIDNEY TRANSPLANT    . LOWER EXTREMITY ANGIOGRAM Bilateral 03/09/2013   Procedure: LOWER EXTREMITY ANGIOGRAM;  Surgeon: Elam Dutch, MD;  Location: [facility] CATH LAB;  Service: Cardiovascular;  Laterality: Bilateral;  . LOWER EXTREMITY ANGIOGRAM Right 10/25/2013   Procedure: LOWER EXTREMITY ANGIOGRAM;  Surgeon: Conrad [place], MD;  Location: [facility] CATH LAB;  Service: Cardiovascular;  Laterality: Right;  . LOWER EXTREMITY ANGIOGRAM Right 12/13/2013   Procedure: LOWER EXTREMITY ANGIOGRAM;  Surgeon: Conrad Ruth, MD;  Location: [facility] CATH LAB;  Service: Cardiovascular;  Laterality: Right;  . NEPHRECTOMY RECIPIENT  2010  . NEPHRECTOMY TRANSPLANTED ORGAN    . PATCH ANGIOPLASTY Right 04/17/2015   Procedure: Rueben Bash PATCH ANGIOPLASTY, RIGHT ILEOFEMORAL;  Surgeon: Conrad [place], MD;  Location: [facility];  Service: Vascular;  Laterality: Right;  . PERIPHERAL VASCULAR CATHETERIZATION N/A 04/10/2015   Procedure: Abdominal Aortogram;  Surgeon: Conrad , MD;  Location: [place] CV LAB;  Service: Cardiovascular;  Laterality: N/A;  . PERIPHERAL VASCULAR CATHETERIZATION N/A 05/10/2016   Procedure: Abdominal Aortogram w/Lower Extremity;  Surgeon: Conrad , MD;  Location: [place] CV LAB;  Service: Cardiovascular;  Laterality: N/A;  . s/p cadaveric renal transplant  December 2010   [place] Left 09/24/2015   Procedure: EXCISION OF THROMBOSED RADIOCEPHALIC ARTERIOVENOUS  FISTULA;  Surgeon: Conrad Inverness, MD;  Location: [facility];  Service: Vascular;  Laterality: Left;  Marland Kitchen VEIN HARVEST Right 04/17/2015   Procedure: RIGHT GREATER [place] ;  Surgeon: Conrad Uhland, MD;  Location: [place];  Service: Vascular;  Laterality: Right;       Family History  Problem Relation Age of Onset  . Hypertension Father   . Other Father        amputation  . Hypertension Mother     Social History   Tobacco Use  . Smoking status: Former Smoker    Packs/day: 0.50    Years: 34.00     Pack years: 17.00    Types: E-cigarettes    Quit date: 02/17/2003    Years since quitting: 16.1  . Smokeless tobacco: Former Systems developer  . Tobacco comment: vapor cigrarette  Substance Use Topics  . Alcohol use: No    Alcohol/week: 0.0 standard drinks  . Drug use: No    Types: Cocaine, Marijuana, Heroin    Comment: 04/25/2013 "abused in the past ; stopped  11/13/2002    Home Medications Prior to Admission medications   Medication Sig Start Date End Date Taking? Authorizing Provider  albuterol (PROVENTIL HFA;VENTOLIN HFA) 108 (90 Base) MCG/ACT inhaler Inhale 1-2 puffs into the lungs every 6 (six) hours as needed for wheezing or shortness of breath. 07/17/18   Rancour, Annie Main, MD  citalopram (CELEXA) 40 MG tablet Take 20 mg by mouth 2 (two) times daily.  02/18/17   [provider]  Cyanocobalamin (VITAMIN B-12 PO) Take 1 tablet by mouth daily.    [provider]  cyclobenzaprine (FLEXERIL) 10 MG tablet Take 1 tablet (10 mg total) by mouth 2 (two) times daily as needed for muscle spasms. 09/21/18   Gareth Morgan, MD  diphenhydramine-acetaminophen (TYLENOL PM) 25-500 MG TABS tablet Take 1 tablet by mouth at bedtime as needed (for pain or sleep).     [provider]  ferrous sulfate 325 (65 FE) MG EC tablet Take 1 tablet (325 mg total) by mouth 2 (two) times daily. 02/07/19   Nita Sells, MD  ketotifen (ZADITOR) 0.025 % ophthalmic solution Place 1 drop into both eyes 2 (two) times daily as needed (for seasonal allergies).    [provider]  lidocaine (LIDODERM) 5 % Place 1 patch onto the skin daily. Remove & Discard patch within 12 hours or as directed by MD Patient taking differently: Place 1 patch onto the skin daily as needed (pain). Remove & Discard patch within 12 hours or as directed by MD 11/19/16   Conrad [place], MD  lisdexamfetamine (VYVANSE) 20 MG capsule Take 20 mg by mouth 2 (two) times daily.     [provider]  metoprolol succinate  (TOPROL-XL) 25 MG 24 hr tablet Take 25 mg by mouth 2 (two) times daily.  08/16/17   [provider]  mycophenolate (CELLCEPT) 250 MG capsule Take 250-500 mg by mouth See admin instructions. Take 500 mg by mouth in the morning and 250 mg in the evening    [provider]  pantoprazole (PROTONIX) 40 MG tablet Take 1 tablet (40 mg total) by mouth 2 (two) times daily. 02/07/19 02/07/20  Nita Sells, MD  pregabalin (LYRICA) 50 MG capsule Take 1 capsule (50 mg total) by mouth 3 (three) times daily. 01/08/16   Conrad Garland, MD  Pyridoxine HCl (VITAMIN B-6 PO) Take 1 tablet by mouth 3 (three) times a week.     [provider]  rosuvastatin (CRESTOR) 5 MG tablet Take 5 mg by mouth at bedtime.     [provider]  tacrolimus (PROGRAF) 1 MG capsule Take 3-4 mg by mouth 2 (two) times daily. 4 mg in the morning and 3 mg in the evening    [provider]  tadalafil (CIALIS) 20 MG tablet Take 20 mg by mouth daily as needed for erectile dysfunction.    [provider]  tamsulosin (FLOMAX) 0.4 MG CAPS capsule Take 0.4 mg by mouth daily.    [provider]  zolpidem (AMBIEN) 10 MG tablet Take 5 mg by mouth at bedtime.     [provider]    Allergies    Tape  Review of Systems   Review of Systems Ten systems are reviewed and are negative for acute change except as noted in the HPI  Physical Exam Updated Vital Signs BP (!) 157/95 (BP Location: Left Arm)   Pulse 69   Temp 98.2 F (36.8 C) (Oral)   Resp 18   SpO2 99%   Physical Exam Constitutional:      General: He is not in acute distress.    Appearance: Normal appearance. He is well-developed. He is not ill-appearing or diaphoretic.  HENT:     Head: Normocephalic and atraumatic.     Right Ear: External ear normal.     Left Ear: External ear normal.     Nose: Nose normal.  Eyes:     General: Vision grossly intact. Gaze aligned appropriately.     Pupils: Pupils are equal,  round, and reactive to light.  Neck:     Trachea: Trachea and phonation normal. No tracheal deviation.  Cardiovascular:     Pulses:          Dorsalis pedis pulses are 2+ on the left side.       Posterior tibial pulses are 2+ on the left side.  Pulmonary:     Effort: Pulmonary effort is normal. No respiratory distress.  Abdominal:     General: There is no distension.     Palpations: Abdomen is soft.     Tenderness: There is no abdominal tenderness. There is no guarding or rebound.  Musculoskeletal:     Cervical back: Normal range of motion.     Right knee: Normal range of motion. No tenderness.     Left knee: No swelling, deformity or erythema. Decreased range of motion. Tenderness present. No patellar tendon tenderness.     Right lower leg: No tenderness.     Left lower leg: Normal. No swelling or tenderness.     Right ankle: No tenderness. Normal range of motion.     Left ankle: Normal. No swelling or deformity. Normal range of motion.     Left Achilles Tendon: Normal.     Right foot: No tenderness.     Left foot: No swelling, deformity or tenderness.  Feet:     Left foot:     Protective Sensation: 5 sites tested. 5 sites sensed.     Skin integrity: Skin integrity normal.  Skin:    General: Skin is warm and dry.  Neurological:     Mental Status: He is alert.     GCS: GCS eye subscore is 4. GCS verbal subscore is 5. GCS motor subscore is 6.     Comments: Speech is clear and goal oriented, follows commands Major Cranial nerves without deficit, no facial droop Moves extremities without ataxia, coordination intact  Psychiatric:  Behavior: Behavior normal.     ED Results / Procedures / Treatments   Labs (all labs ordered are listed, but only abnormal results are displayed) Labs Reviewed - No data to display  EKG None  Radiology No results found.  Procedures Procedures (including critical care time)  Medications Ordered in ED Medications - No data to  display  ED Course  I have reviewed the triage vital signs and the nursing notes.  Pertinent labs & imaging results that were available during my care of the patient were reviewed by me and considered in my medical decision making (see chart for details).  Clinical Course as of Apr 26 1315  Thu Apr 26, 2019  1225 04/22/2019  1   04/22/2019  Hydrocodone-Acetamin 5-325 MG  8.00  2 Ke Mcl   [id number]   Nor ([id number])   0     [BM]    Clinical Course User Index [BM] Gari Crown   MDM Rules/Calculators/A&P                     63 year old male presents today for follow-up of his left knee pain.  He misinterpreted discharge instructions and thought that he was to follow-up here at the emergency department and did not realize that it was with the orthopedist he was to call for a follow-up appointment.  He is requesting discharge so that he may return home and plans to call the orthopedist Dr. Griffin Basil for follow-up this week.  DG Left Knee: Result Date: 04/21/2019 CLINICAL DATA:  Fall and knee pain EXAM: LEFT KNEE - COMPLETE 4+ VIEW COMPARISON:  None. FINDINGS: No evidence of fracture, dislocation, or joint effusion. Mild medial compartment osteoarthritis is seen with subchondral sclerosis. Dense vascular calcifications and surgical clips over the posterior knee. IMPRESSION: No acute osseous abnormality  I reviewed imaging of the left knee obtained 4 days ago and agree with interpretation.  On examination patient is well-appearing and in no acute distress, he has knee brace in place and is using a cane.  I advise that patient continue to use crutches as instructed before to remain nonweightbearing on the left leg and he states understanding and reports he will start using crutches when he gets home today he has refused new crutches here in the ER.  Examination of his knee reveals some mild tenderness of the medial joint line, there is no significant swelling on examination.  There is no color  change or or skin break.  Left knee range of motion is decreased secondary to pain, he has appropriate range of motion of the hip, ankle and foot without pain.  There is no evidence of DVT, compartment syndrome, cellulitis, septic arthritis, gross instability or other emergent pathologies on examination.  No indication for reimaging or further work-up at this time. I have advised that he continue using his knee brace and crutches until he is able to follow-up with orthopedics.  RICE therapy discussed.  Patient states understanding that narcotic prescription would not be refilled today and he is agreeable.   At this time there does not appear to be any evidence of an acute emergency medical condition and the patient appears stable for discharge with appropriate outpatient follow up. Diagnosis was discussed with patient who verbalizes understanding of care plan and is agreeable to discharge. I have discussed return precautions with patient who verbalizes understanding of return precautions. Patient encouraged to follow-up with their PCP and ortho. All questions answered.  Note: Portions of  this report may have been transcribed using voice recognition software. Every effort was made to ensure accuracy; however, inadvertent computerized transcription errors may still be present. Final Clinical Impression(s) / ED Diagnoses Final diagnoses:  Left knee pain, unspecified chronicity    Rx / DC Orders ED Discharge Orders    None       Deliah Boston, PA-C 04/26/19 1325    [address] 04/26/19 1326    Milton Ferguson, MD 04/26/19 1646

## 2019-04-30 DIAGNOSIS — M25562 Pain in left knee: Secondary | ICD-10-CM | POA: Diagnosis not present

## 2019-05-03 DIAGNOSIS — M25562 Pain in left knee: Secondary | ICD-10-CM | POA: Diagnosis not present

## 2019-05-16 ENCOUNTER — Encounter (HOSPITAL_COMMUNITY): Payer: Self-pay

## 2019-05-16 ENCOUNTER — Other Ambulatory Visit: Payer: Self-pay

## 2019-05-16 ENCOUNTER — Ambulatory Visit (HOSPITAL_COMMUNITY)
Admission: EM | Admit: 2019-05-16 | Discharge: 2019-05-16 | Disposition: A | Discharge status: Home / Self Care | Payer: Medicare Other | Attending: Family Medicine | Admitting: Family Medicine

## 2019-05-16 DIAGNOSIS — I10 Essential (primary) hypertension: Secondary | ICD-10-CM | POA: Diagnosis not present

## 2019-05-16 DIAGNOSIS — Z87891 Personal history of nicotine dependence: Secondary | ICD-10-CM | POA: Insufficient documentation

## 2019-05-16 DIAGNOSIS — E7800 Pure hypercholesterolemia, unspecified: Secondary | ICD-10-CM | POA: Diagnosis not present

## 2019-05-16 DIAGNOSIS — Z20822 Contact with and (suspected) exposure to covid-19: Secondary | ICD-10-CM | POA: Insufficient documentation

## 2019-05-16 DIAGNOSIS — K219 Gastro-esophageal reflux disease without esophagitis: Secondary | ICD-10-CM | POA: Insufficient documentation

## 2019-05-16 DIAGNOSIS — Z0189 Encounter for other specified special examinations: Secondary | ICD-10-CM | POA: Diagnosis present

## 2019-05-16 DIAGNOSIS — Z8619 Personal history of other infectious and parasitic diseases: Secondary | ICD-10-CM | POA: Diagnosis not present

## 2019-05-16 DIAGNOSIS — Z8249 Family history of ischemic heart disease and other diseases of the circulatory system: Secondary | ICD-10-CM | POA: Diagnosis not present

## 2019-05-16 DIAGNOSIS — Z1152 Encounter for screening for COVID-19: Secondary | ICD-10-CM | POA: Diagnosis not present

## 2019-05-16 DIAGNOSIS — Z940 Kidney transplant status: Secondary | ICD-10-CM | POA: Diagnosis not present

## 2019-05-16 NOTE — ED Triage Notes (Signed)
Pt requests covid test for new job; denies any URI sx, fever, chills, abd pain or n/v/d. Had covid test approx 1 month ago-negative result.

## 2019-05-16 NOTE — Discharge Instructions (Addendum)
You have been tested for COVID-19 today. If your test returns positive, you will receive a phone call from [facility] regarding your results. Negative test results are not called. Both positive and negative results area always visible on MyChart. If you do not have a MyChart account, sign up instructions are provided in your discharge papers. Please do not hesitate to contact us should you have questions or concerns.  Your blood pressure was noted to be elevated during your visit today. You may return here within the next few days to recheck if unable to see your primary care doctor. If your blood pressure remains persistently elevated, you may need to alter your medications.  BP (!) 172/89 (BP Location: Left Arm)   Pulse 63   Temp 98.3 F (36.8 C) (Oral)   Resp 16   SpO2 100%

## 2019-05-17 DIAGNOSIS — M25562 Pain in left knee: Secondary | ICD-10-CM | POA: Diagnosis not present

## 2019-05-18 ENCOUNTER — Ambulatory Visit (INDEPENDENT_AMBULATORY_CARE_PROVIDER_SITE_OTHER): Payer: Medicare Other | Admitting: Cardiology

## 2019-05-18 ENCOUNTER — Encounter: Payer: Self-pay | Admitting: Cardiology

## 2019-05-18 ENCOUNTER — Other Ambulatory Visit: Payer: Self-pay

## 2019-05-18 VITALS — BP 136/67 | HR 95 | Temp 97.9°F | Resp 16 | Ht 70.0 in | Wt 162.5 lb

## 2019-05-18 DIAGNOSIS — I358 Other nonrheumatic aortic valve disorders: Secondary | ICD-10-CM | POA: Diagnosis not present

## 2019-05-18 DIAGNOSIS — I70213 Atherosclerosis of native arteries of extremities with intermittent claudication, bilateral legs: Secondary | ICD-10-CM | POA: Diagnosis not present

## 2019-05-18 DIAGNOSIS — I10 Essential (primary) hypertension: Secondary | ICD-10-CM

## 2019-05-18 DIAGNOSIS — I6523 Occlusion and stenosis of bilateral carotid arteries: Secondary | ICD-10-CM | POA: Diagnosis not present

## 2019-05-18 LAB — NOVEL CORONAVIRUS, NAA (HOSP ORDER, SEND-OUT TO REF LAB; TAT 18-24 HRS): SARS-CoV-2, NAA: NOT DETECTED

## 2019-05-18 NOTE — Patient Instructions (Addendum)
To whom it may concern,   Scott Chavez (1955-11-26) is a patient under my cardiac care. From a cardiac standpoint patient has remained stable.  He has been on Vyvanse since before 2019 and tolerated this well.  I see no contraindication from my standpoint for him to continue to be on Vyvanse at this time.     Jeri Lager, FNP-C

## 2019-05-18 NOTE — Progress Notes (Signed)
Primary Physician:  Benito Mccreedy, MD   Patient ID: Scott Chavez, male    DOB: [birth year], 64 y.o.   MRN: [id number]  Subjective:    Chief Complaint  Patient presents with  . Hypertension    Follow up    HPI: Scott Chavez  is a 65 y.o. male  with known PAD with multiple revascularizations by Dr. Bridgett Larsson, hypertension, hyperlipidemia, previous kidney transplant at [facility], microcytic anemia being followed by oncology, former tobacco use, last seen by [place] in  2019 for chest pain felt to be likely musculoskeletal etiology, in which he underwent Lexiscan nuclear stress testing that was considered low risk study.  He now presents for hypertension follow-up.  He states that he is doing well. No complaints. He remains active with yard work. States that he raked leaves prior to his appointment. No chest pain or dyspnea on exertion unless he over exerts himself. Claudication symptoms have been stable, and is to see Vascular surgery next week. He has recently had carotid duplex and presents to discuss results.  He is requesting a clearance letter from [place] to resume taking his Vyvanse. He has been on Vyvanse for the last several years, states that he has established with a new psych, who request clearance from [place].   He is a former cigarette smoker that is now using vape.  Past Medical History:  Diagnosis Date  . ADHD   . Anemia    pt. not sure, but reports he is taking Fe for one month now.  . Aorto-iliac disease ([place])   . Bladder dysfunction   . ESRD (end stage renal disease) [facility])    [facility]- [facility] , followed by Dr. Moshe Cipro   . GERD (gastroesophageal reflux disease)   . Hemodialysis patient [facility])    "on dialysis 1998-2010" 04/25/2013)  . Hepatitis C    has been treated with Harvoni  . History of renal failure   . Hypercholesteremia   . Hypertension   . Internal hemorrhoids    Colonoscopy 2010 and anoscopy 2015  . Peripheral vascular disease ([place])    . Pneumonia   . Self-catheterizes urinary bladder    pt. choice, but he reports that he desires to do this to avoid retention     Past Surgical History:  Procedure Laterality Date  . ABDOMINAL AORTAGRAM N/A 03/09/2013   Procedure: ABDOMINAL Maxcine Ham;  Surgeon: Elam Dutch, MD;  Location: [facility] CATH LAB;  Service: Cardiovascular;  Laterality: N/A;  . AV FISTULA PLACEMENT Left 1998   "removed 2011" (04/25/2013)  . AV FISTULA REPAIR Left    removal with partial vein removed  . COLONOSCOPY    . ENDARTERECTOMY FEMORAL Right 04/17/2015   Procedure: ENDARTERECTOMY RIGHT ILIOFEMORAL WITH PROFUNDOPLASTY;  Surgeon: Conrad Avis, MD;  Location: [place];  Service: Vascular;  Laterality: Right;  . ENDARTERECTOMY FEMORAL Right 10/25/2016   Procedure: RIGHT ILIOFEMORAL ENDARTERECTOMY WITH BOVINE PERICARDIAL PATCH ANGIOPLASTY;  Surgeon: Conrad Manitou, MD;  Location: [place];  Service: Vascular;  Laterality: Right;  . ESOPHAGOGASTRODUODENOSCOPY (EGD) WITH PROPOFOL N/A 02/05/2019   Procedure: ESOPHAGOGASTRODUODENOSCOPY (EGD) WITH PROPOFOL;  Surgeon: Clarene Essex, MD;  Location: [facility] ENDOSCOPY;  Service: Endoscopy;  Laterality: N/A;  . FEMORAL-POPLITEAL BYPASS GRAFT Left 04/24/2013   Procedure: BYPASS GRAFT COMMON FEMORALARTERY-BELOW KNEE POPLITEAL ARTERY WITH GREATER SAPHENOUS VEIN;  Surgeon: Conrad Highland Beach, MD;  Location: [place];  Service: Vascular;  Laterality: Left;  . FEMORAL-POPLITEAL BYPASS GRAFT Right 04/17/2015   Procedure: BYPASS GRAFT  RIGHT FEMORALTO BELOW KNEE POPLITEAL ARTERY USING RIGHT NON REVERSED GREATER SAPPHENOUS VEIN;  Surgeon: Conrad Osseo, MD;  Location: [place];  Service: Vascular;  Laterality: Right;  . INTRAOPERATIVE ARTERIOGRAM Left 04/24/2013   Procedure: INTRA OPERATIVE ARTERIOGRAM;  Surgeon: Conrad Barrow, MD;  Location: [place];  Service: Vascular;  Laterality: Left;  . KIDNEY TRANSPLANT    . LOWER EXTREMITY ANGIOGRAM Bilateral 03/09/2013   Procedure: LOWER EXTREMITY ANGIOGRAM;  Surgeon:  Elam Dutch, MD;  Location: [facility] CATH LAB;  Service: Cardiovascular;  Laterality: Bilateral;  . LOWER EXTREMITY ANGIOGRAM Right 10/25/2013   Procedure: LOWER EXTREMITY ANGIOGRAM;  Surgeon: Conrad Elsmere, MD;  Location: [facility] CATH LAB;  Service: Cardiovascular;  Laterality: Right;  . LOWER EXTREMITY ANGIOGRAM Right 12/13/2013   Procedure: LOWER EXTREMITY ANGIOGRAM;  Surgeon: Conrad Refugio, MD;  Location: [facility] CATH LAB;  Service: Cardiovascular;  Laterality: Right;  . NEPHRECTOMY RECIPIENT  2010  . NEPHRECTOMY TRANSPLANTED ORGAN    . PATCH ANGIOPLASTY Right 04/17/2015   Procedure: Rueben Bash PATCH ANGIOPLASTY, RIGHT ILEOFEMORAL;  Surgeon: Conrad Excelsior, MD;  Location: [place];  Service: Vascular;  Laterality: Right;  . PERIPHERAL VASCULAR CATHETERIZATION N/A 04/10/2015   Procedure: Abdominal Aortogram;  Surgeon: Conrad Santa Margarita, MD;  Location: [place] CV LAB;  Service: Cardiovascular;  Laterality: N/A;  . PERIPHERAL VASCULAR CATHETERIZATION N/A 05/10/2016   Procedure: Abdominal Aortogram w/Lower Extremity;  Surgeon: Conrad Toeterville, MD;  Location: [place] CV LAB;  Service: Cardiovascular;  Laterality: N/A;  . s/p cadaveric renal transplant  December 2010   [place] Left 09/24/2015   Procedure: EXCISION OF THROMBOSED RADIOCEPHALIC ARTERIOVENOUS FISTULA;  Surgeon: Conrad Alsip, MD;  Location: [place];  Service: Vascular;  Laterality: Left;  Marland Kitchen VEIN HARVEST Right 04/17/2015   Procedure: RIGHT GREATER SAPPHENOUS VEIN HARVEST ;  Surgeon: Conrad Annex, MD;  Location: [place];  Service: Vascular;  Laterality: Right;    Social History   Socioeconomic History  . Marital status: Divorced    Spouse name: Not on file  . Number of children: 3  . Years of education: Not on file  . Highest education level: Not on file  Occupational History  . Occupation: Ship broker  . 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 Smoking status: Former Smoker    Packs/day: 0.50    Years: 34.00    Pack  years: 17.00    Types: E-cigarettes    Quit date: 02/17/2003    Years since quitting: 16.2  . Smokeless tobacco: Former Systems developer  . Tobacco comment: vapor cigrarette  Substance and Sexual Activity  . Alcohol use: No    Alcohol/week: 0.0 standard drinks  . Drug use: Not Currently    Types: Cocaine, Marijuana, Heroin    Comment: 04/25/2013 "abused in the past ; stopped  11/13/2002  . Sexual activity: Not on file  Other Topics Concern  . Not on file  Social History Narrative   Patient reports he is divorced. 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   On disability otherwise, he is a vapor smoker, 3 caffeinated beverages daily   Social Determinants of Health   Financial Resource Strain:   . Difficulty of Paying Living Expenses: Not on file  Food Insecurity:   . Worried About Charity fundraiser in the Last Year: Not on file  . Ran Out of Food in the Last Year: Not on file  Transportation Needs:   . Film/video editor (Medical): Not on file  . Lack of Transportation (Non-Medical): Not on file  Physical Activity:   . Days of Exercise per Week: Not on file  . Minutes of Exercise per Session: Not on file  Stress:   . Feeling of Stress : Not on file  Social Connections:   . Frequency of Communication with Friends and Family: Not on file  . Frequency of Social Gatherings with Friends and Family: Not on file  . Attends Religious Services: Not on file  . Active Member of Clubs or Organizations: Not on file  . Attends Archivist Meetings: Not on file  . Marital Status: Not on file  Intimate Partner Violence:   . Fear of Current or Ex-Partner: Not on file  . Emotionally Abused: Not on file  . Physically Abused: Not on file  . Sexually Abused: Not on file    Review of Systems  Constitution: Negative for decreased appetite, malaise/fatigue, weight gain and weight loss.  Eyes: Negative for visual disturbance.  Cardiovascular: Positive for claudication and dyspnea  on exertion. Negative for chest pain, leg swelling, orthopnea, palpitations and syncope.  Respiratory: Negative for hemoptysis and wheezing.   Endocrine: Negative for cold intolerance and heat intolerance.  Hematologic/Lymphatic: Does not bruise/bleed easily.  Skin: Negative for nail changes.  Musculoskeletal: Negative for muscle weakness and myalgias.  Gastrointestinal: Negative for abdominal pain, change in bowel habit, nausea and vomiting.  Neurological: Negative for difficulty with concentration, dizziness, focal weakness and headaches.  Psychiatric/Behavioral: Negative for altered mental status and suicidal ideas.  All other systems reviewed and are negative.     Objective:  Blood pressure 136/67, pulse 95, temperature 97.9 F (36.6 C), temperature source Temporal, resp. rate 16, height 5' 10"  (1.778 m), weight 162 lb 8 oz (73.7 kg), SpO2 97 %. Body mass index is 23.32 kg/m.    Physical Exam  Constitutional: He is oriented to person, place, and time. Vital signs are normal. He appears well-developed and well-nourished.  HENT:  Head: Normocephalic and atraumatic.  Cardiovascular: Normal rate, regular rhythm, normal heart sounds and intact distal pulses.  Pulses:      Carotid pulses are on the left side with bruit.      Femoral pulses are 2+ on the right side with bruit and 2+ on the left side with bruit.      Popliteal pulses are 2+ on the right side and 2+ on the left side.       Dorsalis pedis pulses are 0 on the right side and 0 on the left side.  Pulmonary/Chest: Effort normal and breath sounds normal. No accessory muscle usage. No respiratory distress.  Abdominal: Soft. Bowel sounds are normal.  Musculoskeletal:        General: Normal range of motion.     Cervical back: Normal range of motion.  Neurological: He is alert and oriented to person, place, and time.  Skin: Skin is warm and dry.  Vitals reviewed.  Radiology: No results found.  Laboratory examination:    04/13/2017: Iron studies normal. RBC 5.14, hemoglobin 12.8, hematocrit 40, microcytic indices, CBC otherwise normal. Hemoglobin A1c 5.7%. Creatinine 1.07, EGFR 75/86, potassium 4.8, CMP normal. TSH 1.17. CMP Latest Ref Rng & Units 02/07/2019 02/06/2019 02/05/2019  Glucose 70 - 99 mg/dL 108(H) 101(H) 108(H)  BUN 8 - 23 mg/dL 11 20 43(H)  Creatinine 0.61 - 1.24 mg/dL 0.95 1.04 1.56(H)  Sodium 135 - 145 mmol/L 138 139 142  Potassium 3.5 - 5.1 mmol/L 4.0 4.0 4.0  Chloride 98 - 111 mmol/L 107 110 113(H)  CO2 22 - 32 mmol/L 23 23 23   Calcium 8.9 - 10.3 mg/dL 8.8(L) 8.4(L) 8.7(L)  Total Protein 6.5 - 8.1 g/dL 5.4(L) - -  Total Bilirubin 0.3 - 1.2 mg/dL 0.5 - -  Alkaline Phos 38 - 126 U/L 30(L) - -  AST 15 - 41 U/L 11(L) - -  ALT 0 - 44 U/L 9 - -   CBC Latest Ref Rng & Units 02/07/2019 02/06/2019 02/06/2019  WBC 4.0 - 10.5 K/uL 5.0 5.8 6.0  Hemoglobin 13.0 - 17.0 g/dL 7.4(L) 7.5(L) 7.1(L)  Hematocrit 39.0 - 52.0 % 23.5(L) 24.4(L) 22.8(L)  Platelets 150 - 400 K/uL 122(L) 114(L) 113(L)   Lipid Panel     Component Value Date/Time   CHOL 174 02/16/2016 1514   TRIG 55 02/16/2016 1514   HDL 80 02/16/2016 1514   CHOLHDL 2.2 02/16/2016 1514   VLDL 11 02/16/2016 1514   LDLCALC 83 02/16/2016 1514   HEMOGLOBIN A1C No results found for: HGBA1C, MPG TSH No results for input(s): TSH in the last 8760 hours.  PRN Meds:. Medications Discontinued During This Encounter  Medication Reason  . cyclobenzaprine (FLEXERIL) 10 MG tablet Error  . diphenhydramine-acetaminophen (TYLENOL PM) 25-500 MG TABS tablet Error  . lidocaine (LIDODERM) 5 % Error   Current Meds  Medication Sig  . albuterol (PROVENTIL HFA;VENTOLIN HFA) 108 (90 Base) MCG/ACT inhaler Inhale 1-2 puffs into the lungs every 6 (six) hours as needed for wheezing or shortness of breath.  . citalopram (CELEXA) 40 MG tablet Take 20 mg by mouth 2 (two) times daily.   . Cyanocobalamin (VITAMIN B-12 PO) Take 1 tablet by mouth daily.  . ferrous  sulfate 325 (65 FE) MG EC tablet Take 1 tablet (325 mg total) by mouth 2 (two) times daily.  Marland Kitchen ketotifen (ZADITOR) 0.025 % ophthalmic solution Place 1 drop into both eyes 2 (two) times daily as needed (for seasonal allergies).  . lisdexamfetamine (VYVANSE) 20 MG capsule Take 20 mg by mouth 2 (two) times daily.   . metoprolol succinate (TOPROL-XL) 25 MG 24 hr tablet Take 25 mg by mouth 2 (two) times daily.   . mycophenolate (CELLCEPT) 250 MG capsule Take 250-500 mg by mouth See admin instructions. Take 500 mg by mouth in the morning and 250 mg in the evening  . pantoprazole (PROTONIX) 40 MG tablet Take 1 tablet (40 mg total) by mouth 2 (two) times daily.  . pregabalin (LYRICA) 50 MG capsule Take 1 capsule (50 mg total) by mouth 3 (three) times daily.  . Pyridoxine HCl (VITAMIN B-6 PO) Take 1 tablet by mouth 3 (three) times a week.   . rosuvastatin (CRESTOR) 5 MG tablet Take 5 mg by mouth at bedtime.   . tacrolimus (PROGRAF) 1 MG capsule Take 3-4 mg by mouth 2 (two) times daily. 4 mg in the morning and 3 mg in the evening  . tadalafil (CIALIS) 20 MG tablet Take 20 mg by mouth daily as needed for erectile dysfunction.  . tamsulosin (FLOMAX) 0.4 MG CAPS capsule Take 0.4 mg by mouth daily.  Marland Kitchen zolpidem (AMBIEN) 10 MG tablet Take 5 mg by mouth at bedtime.     Cardiac Studies:   Carotid artery duplex  03/16/2019: Minimal stenosis in the right internal carotid artery (minimal). Stenosis in the left internal carotid artery (16-49%). Stenosis in the left common carotid artery (<50%). There is moderate diffuse plaque noted in  bilateral carotid arteries (see images). Antegrade right vertebral artery flow. Antegrade left vertebral artery flow. Follow up in one year is appropriate if clinically indicated.  Lexiscan myoview stress test 05/27/2017: 1. The resting electrocardiogram demonstrated normal sinus rhythm, RAE, LAD, LAFB, normal resting conduction and no resting arrhythmias. Stress EKG is  non-diagnostic for ischemia as it a pharmacologic stress using Lexiscan. Stress symptoms included dizziness. 2. Myocardial perfusion imaging is normal. Overall left ventricular systolic function was normal without regional wall motion abnormalities. The left ventricular ejection fraction was 52%. This is a low risk study.   Dr. Bridgett Larsson; left iliofem-pop bypass in 2014 and right iliofem-pop bypass in 2016 and multiple revascularizations  Assessment:   Primary hypertension  Atherosclerosis of native artery of both lower extremities with intermittent claudication (HCC)  Aortic systolic murmur on examination  Bilateral carotid artery stenosis  EKG 02/14/2019 at [facility]: Sinus tachycardia at 113 bpm, right atrial enlargement, left axis deviation, left anterior fascicular block. PRWP, probably normal variant. Nonspecific T abnormality.  EKG 05/18/2017: Normal sinus rhythm at rate of 74 bpm, right atrial enlargement, left axis deviation, left anterior fascicular block. Poor R-wave progression, probably normal variant. Nonspecific T abnormality.  Recommendations:   Patient is here for follow up on hypertension and to discuss recent carotid duplex results. He is doing well without any complaints. No symptoms of angina. Blood pressure has been well controlled. He reports that kidney function has also been stable, continues to follow Dr. Moshe Cipro and transplant team. He is tolerating medications well, will continue the same.  I have discussed recent carotid duplex results, bilateral carotid disease has remained stable. Will need to follow up in 1 year for continued surveillance. He is on appropriate medical therapy. I am unsure of recent lipids, will request from PCP office.   He has history of PAD with multiple revascularizations in the past, claudication has been stable. He continues to be followed by Vascular and is to see them next week.   In regards to his Vyvanse, blood pressure has been well  controlled. He has been on this for several years and tolerated well; therefore, I see no contraindication for him being on this at this time from our standpoint. Will provide letter for him.   He does have newly noted systolic aortic murmur on exam. Will obtain echocardiogram for further evaluation. No evidence of heart failure. I will plan to see him back after the echo to discuss the results.   Miquel Dunn, MSN, APRN, FNP-C [facility] Cardiovascular. [place] Office: [phone] Fax: [phone]
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
## 2019-05-24 ENCOUNTER — Other Ambulatory Visit: Payer: Medicare Other

## 2019-05-24 DIAGNOSIS — Z5329 Procedure and treatment not carried out because of patient's decision for other reasons: Secondary | ICD-10-CM

## 2019-05-29 ENCOUNTER — Ambulatory Visit (INDEPENDENT_AMBULATORY_CARE_PROVIDER_SITE_OTHER): Payer: Medicare Other

## 2019-05-29 ENCOUNTER — Telehealth (HOSPITAL_COMMUNITY): Payer: Self-pay

## 2019-05-29 ENCOUNTER — Other Ambulatory Visit: Payer: Self-pay

## 2019-05-29 DIAGNOSIS — I358 Other nonrheumatic aortic valve disorders: Secondary | ICD-10-CM

## 2019-05-29 DIAGNOSIS — I739 Peripheral vascular disease, unspecified: Secondary | ICD-10-CM

## 2019-05-29 DIAGNOSIS — I70211 Atherosclerosis of native arteries of extremities with intermittent claudication, right leg: Secondary | ICD-10-CM

## 2019-05-29 NOTE — Telephone Encounter (Signed)

## 2019-05-30 ENCOUNTER — Encounter (HOSPITAL_COMMUNITY): Payer: Medicare Other

## 2019-05-30 ENCOUNTER — Ambulatory Visit: Payer: Medicare Other

## 2019-05-30 ENCOUNTER — Other Ambulatory Visit (HOSPITAL_COMMUNITY): Payer: Medicare Other

## 2019-05-31 ENCOUNTER — Ambulatory Visit: Payer: Medicare Other | Attending: Physician Assistant | Admitting: Physical Therapy

## 2019-06-01 NOTE — Progress Notes (Signed)
HISTORY AND PHYSICAL     CC:  follow up. Requesting Provider:  Benito Mccreedy, MD  HPI: This is a 64 y.o. male who is here today for follow up.  He has hx of bilateral CIA stents in 2015 by Dr. Bridgett Larsson as well as a left CFA to BK popliteal bypass 04/24/2013 by Dr. Bridgett Larsson.  He has hx of right iliofemoral endarterectomy with a right CFA to BK popliteal bypass with non reversed saphenous vein in 2016.   He has a stenosis in the right CIA stent that is being followed.    He was last evaluated by telephone visit in July 2021 by Dr. Carlis Abbott.  At that time, both bypasses were patent and velocities essentially unchanged.  His ABI's were slightly decreased at 0.86 on the right and 0.91 on the left.  He had a right CIA stenosis with velocity of 270 that was increased from 250 previously.  He did not have any new issues with his legs.  He was able to walk and mow the lawn.    He was hospitalized in October 2020 for GIB and his asa was discontinued for several weeks after dc.  He did have AKI with improved creatinine. He is a renal transplant patient as of 04/14/2009.  The pt returns today for his 6 month follow up. His biggest complaint today is increased lower extremity swelling over the past several months. He states he has picked up some PRN jobs and has increased his activity and has noticed swelling at the end of the day in his legs. He has been wearing knee high stockings both compressive and some just dress stockings which he feels has kept the swelling down. He additionally does report  having bilateral calf pain on ambulation a couple blocks with left discomfort > right. It does improve with rest. The discomfort has not stopped him from doing the activities that he wants to do. He is still able to walk his dog and mow his lawn. He otherwise denies any new issues with his legs. He does not have any nonhealing wounds or rest pain  He does continue to have left groin discomfort. He discussed this with Dr.  Carlis Abbott at the time of his last office visit in July 2020. He has a hernia that is present which he states he still has not had evaluated but it seems to be increasing in discomfort. He continues to use an in and out catheter to urinate, which has not changed since prior visits. Otherwise no symptoms indicating incarceration.  The pt is on a statin for cholesterol management.    The pt is not on an aspirin.    Other AC:  none The pt takes metoprolol for hypertension.  The pt does not have diabetes. Tobacco hx:  Former-quit 2004. He does vape   Past Medical History:  Diagnosis Date  . ADHD   . Anemia    pt. not sure, but reports he is taking Fe for one month now.  . Aorto-iliac disease ([place])   . Bladder dysfunction   . ESRD (end stage renal disease) [facility])    [facility]- [facility] , followed by Dr. Moshe Cipro   . GERD (gastroesophageal reflux disease)   . Hemodialysis patient [facility])    "on dialysis 1998-2010" 04/25/2013)  . Hepatitis C    has been treated with Harvoni  . History of renal failure   . Hypercholesteremia   . Hypertension   . Internal hemorrhoids    Colonoscopy  2010 and anoscopy 2015  . Peripheral vascular disease ([place])   . Pneumonia   . Self-catheterizes urinary bladder    pt. choice, but he reports that he desires to do this to avoid retention     Past Surgical History:  Procedure Laterality Date  . ABDOMINAL AORTAGRAM N/A 03/09/2013   Procedure: ABDOMINAL Maxcine Ham;  Surgeon: Elam Dutch, MD;  Location: [facility] CATH LAB;  Service: Cardiovascular;  Laterality: N/A;  . AV FISTULA PLACEMENT Left 1998   "removed 2011" (04/25/2013)  . AV FISTULA REPAIR Left    removal with partial vein removed  . COLONOSCOPY    . ENDARTERECTOMY FEMORAL Right 04/17/2015   Procedure: ENDARTERECTOMY RIGHT ILIOFEMORAL WITH PROFUNDOPLASTY;  Surgeon: Conrad Shaktoolik, MD;  Location: [place];  Service: Vascular;  Laterality: Right;  . ENDARTERECTOMY FEMORAL Right 10/25/2016    Procedure: RIGHT ILIOFEMORAL ENDARTERECTOMY WITH BOVINE PERICARDIAL PATCH ANGIOPLASTY;  Surgeon: Conrad Custer, MD;  Location: [place];  Service: Vascular;  Laterality: Right;  . ESOPHAGOGASTRODUODENOSCOPY (EGD) WITH PROPOFOL N/A 02/05/2019   Procedure: ESOPHAGOGASTRODUODENOSCOPY (EGD) WITH PROPOFOL;  Surgeon: Clarene Essex, MD;  Location: [facility] ENDOSCOPY;  Service: Endoscopy;  Laterality: N/A;  . FEMORAL-POPLITEAL BYPASS GRAFT Left 04/24/2013   Procedure: BYPASS GRAFT COMMON FEMORALARTERY-BELOW KNEE POPLITEAL ARTERY WITH GREATER SAPHENOUS VEIN;  Surgeon: Conrad [place], MD;  Location: [place];  Service: Vascular;  Laterality: Left;  . FEMORAL-POPLITEAL BYPASS GRAFT Right 04/17/2015   Procedure: BYPASS GRAFT RIGHT FEMORALTO BELOW KNEE POPLITEAL ARTERY USING RIGHT NON REVERSED GREATER SAPPHENOUS VEIN;  Surgeon: Conrad Thurmond, MD;  Location: [place];  Service: Vascular;  Laterality: Right;  . INTRAOPERATIVE ARTERIOGRAM Left 04/24/2013   Procedure: INTRA OPERATIVE ARTERIOGRAM;  Surgeon: Conrad Holmen, MD;  Location: [place];  Service: Vascular;  Laterality: Left;  . KIDNEY TRANSPLANT    . LOWER EXTREMITY ANGIOGRAM Bilateral 03/09/2013   Procedure: LOWER EXTREMITY ANGIOGRAM;  Surgeon: Elam Dutch, MD;  Location: [facility] CATH LAB;  Service: Cardiovascular;  Laterality: Bilateral;  . LOWER EXTREMITY ANGIOGRAM Right 10/25/2013   Procedure: LOWER EXTREMITY ANGIOGRAM;  Surgeon: Conrad Piperton, MD;  Location: [facility] CATH LAB;  Service: Cardiovascular;  Laterality: Right;  . LOWER EXTREMITY ANGIOGRAM Right 12/13/2013   Procedure: LOWER EXTREMITY ANGIOGRAM;  Surgeon: Conrad Damascus, MD;  Location: [facility] CATH LAB;  Service: Cardiovascular;  Laterality: Right;  . NEPHRECTOMY RECIPIENT  2010  . NEPHRECTOMY TRANSPLANTED ORGAN    . PATCH ANGIOPLASTY Right 04/17/2015   Procedure: Rueben Bash PATCH ANGIOPLASTY, RIGHT ILEOFEMORAL;  Surgeon: Conrad Westfield, MD;  Location: [place];  Service: Vascular;  Laterality: Right;  . PERIPHERAL VASCULAR  CATHETERIZATION N/A 04/10/2015   Procedure: Abdominal Aortogram;  Surgeon: Conrad Manitou, MD;  Location: [place] CV LAB;  Service: Cardiovascular;  Laterality: N/A;  . PERIPHERAL VASCULAR CATHETERIZATION N/A 05/10/2016   Procedure: Abdominal Aortogram w/Lower Extremity;  Surgeon: Conrad [place], MD;  Location: [place] CV LAB;  Service: Cardiovascular;  Laterality: N/A;  . s/p cadaveric renal transplant  December 2010   [place] Left 09/24/2015   Procedure: EXCISION OF THROMBOSED RADIOCEPHALIC ARTERIOVENOUS FISTULA;  Surgeon: Conrad Christine, MD;  Location: [place];  Service: Vascular;  Laterality: Left;  Marland Kitchen VEIN HARVEST Right 04/17/2015   Procedure: RIGHT GREATER SAPPHENOUS VEIN HARVEST ;  Surgeon: Conrad Lynch, MD;  Location: [place];  Service: Vascular;  Laterality: Right;    Allergies  Allergen Reactions  . Tape Rash and Other (See Comments)  NO Silk tape, please!!    Current Outpatient Medications  Medication Sig Dispense Refill  . albuterol (PROVENTIL HFA;VENTOLIN HFA) 108 (90 Base) MCG/ACT inhaler Inhale 1-2 puffs into the lungs every 6 (six) hours as needed for wheezing or shortness of breath. 1 Inhaler 0  . citalopram (CELEXA) 40 MG tablet Take 20 mg by mouth 2 (two) times daily.   2  . Cyanocobalamin (VITAMIN B-12 PO) Take 1 tablet by mouth daily.    . ferrous sulfate 325 (65 FE) MG EC tablet Take 1 tablet (325 mg total) by mouth 2 (two) times daily. 90 tablet 3  . ketotifen (ZADITOR) 0.025 % ophthalmic solution Place 1 drop into both eyes 2 (two) times daily as needed (for seasonal allergies).    . lisdexamfetamine (VYVANSE) 20 MG capsule Take 20 mg by mouth 2 (two) times daily.     . metoprolol succinate (TOPROL-XL) 25 MG 24 hr tablet Take 25 mg by mouth 2 (two) times daily.   2  . mycophenolate (CELLCEPT) 250 MG capsule Take 250-500 mg by mouth See admin instructions. Take 500 mg by mouth in the morning and 250 mg in the evening    . pantoprazole  (PROTONIX) 40 MG tablet Take 1 tablet (40 mg total) by mouth 2 (two) times daily. 60 tablet 11  . pregabalin (LYRICA) 50 MG capsule Take 1 capsule (50 mg total) by mouth 3 (three) times daily. 30 capsule 5  . Pyridoxine HCl (VITAMIN B-6 PO) Take 1 tablet by mouth 3 (three) times a week.     . rosuvastatin (CRESTOR) 5 MG tablet Take 5 mg by mouth at bedtime.     . tacrolimus (PROGRAF) 1 MG capsule Take 3-4 mg by mouth 2 (two) times daily. 4 mg in the morning and 3 mg in the evening    . tadalafil (CIALIS) 20 MG tablet Take 20 mg by mouth daily as needed for erectile dysfunction.    . tamsulosin (FLOMAX) 0.4 MG CAPS capsule Take 0.4 mg by mouth daily.    Marland Kitchen zolpidem (AMBIEN) 10 MG tablet Take 5 mg by mouth at bedtime.      No current facility-administered medications for this visit.    Family History  Problem Relation Age of Onset  . Hypertension Father   . Other Father        amputation  . Hypertension Mother     Social History   Socioeconomic History  . Marital status: Divorced    Spouse name: Not on file  . Number of children: 3  . Years of education: Not on file  . Highest education level: Not on file  Occupational History  . Occupation: Ship broker  . 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 Smoking status: Former Smoker    Packs/day: 0.50    Years: 34.00    Pack years: 17.00    Types: E-cigarettes    Quit date: 02/17/2003    Years since quitting: 16.3  . Smokeless tobacco: Former Systems developer  . Tobacco comment: vapor cigrarette  Substance and Sexual Activity  . Alcohol use: No    Alcohol/week: 0.0 standard drinks  . Drug use: Not Currently    Types: Cocaine, Marijuana, Heroin    Comment: 04/25/2013 "abused in the past ; stopped  11/13/2002  . Sexual activity: Not on file  Other Topics Concern  . Not on file  Social History Narrative   Patient reports he is divorced. 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 As  of  2015.   On disability otherwise, he is a vapor smoker, 3  caffeinated beverages daily   Social Determinants of Health   Financial Resource Strain:   . Difficulty of Paying Living Expenses: Not on file  Food Insecurity:   . Worried About Charity fundraiser in the Last Year: Not on file  . Ran Out of Food in the Last Year: Not on file  Transportation Needs:   . Lack of Transportation (Medical): Not on file  . Lack of Transportation (Non-Medical): Not on file  Physical Activity:   . Days of Exercise per Week: Not on file  . Minutes of Exercise per Session: Not on file  Stress:   . Feeling of Stress : Not on file  Social Connections:   . Frequency of Communication with Friends and Family: Not on file  . Frequency of Social Gatherings with Friends and Family: Not on file  . Attends Religious Services: Not on file  . Active Member of Clubs or Organizations: Not on file  . Attends Archivist Meetings: Not on file  . Marital Status: Not on file  Intimate Partner Violence:   . Fear of Current or Ex-Partner: Not on file  . Emotionally Abused: Not on file  . Physically Abused: Not on file  . Sexually Abused: Not on file     REVIEW OF SYSTEMS:   [X]  denotes positive finding, [ ]  denotes negative finding Cardiac  Comments:  Chest pain or chest pressure:    Shortness of breath upon exertion:    Short of breath when lying flat:    Irregular heart rhythm:        Vascular    Pain in calf, thigh, or hip brought on by ambulation:    Pain in feet at night that wakes you up from your sleep:     Blood clot in your veins:    Leg swelling:         Pulmonary    Oxygen at home:    Productive cough:     Wheezing:         Neurologic    Sudden weakness in arms or legs:     Sudden numbness in arms or legs:     Sudden onset of difficulty speaking or slurred speech:    Temporary loss of vision in one eye:     Problems with dizziness:         Gastrointestinal    Blood in stool:     Vomited blood:         Genitourinary    Burning  when urinating:     Blood in urine:        Psychiatric    Major depression:         Hematologic    Bleeding problems:    Problems with blood clotting too easily:        Skin    Rashes or ulcers:        Constitutional    Fever or chills:      PHYSICAL EXAMINATION:  General:  Well groomed and well appearing, pleasant, and in no discomfort Gait: normal gait HENT: WNL, normocephalic Pulmonary: normal non-labored breathing , without Rales, rhonchi,  wheezing Cardiac: regular HR, without  Murmurs; without carotid bruit Abdomen: soft, NT, no masses. Small umbilical hernia present. Reducible. Nontender. Left inguinal hernia, reducible. Nontender. Skin: without rashes Vascular Exam/Pulses:  Right Left  Radial 2+ (normal) 2+ (normal)  Ulnar 2+ (normal)  2+ (normal)  Femoral 2+ (normal) 2+ (normal)  Popliteal absent absent  DP 2+ (normal) 2+ (normal)  PT 2+ (normal) 2+ (normal)   Extremities: without ischemic changes, without Gangrene , without cellulitis; without open wounds;  Musculoskeletal: no muscle wasting or atrophy  Neurologic: A&O X 3;  No focal weakness or paresthesias are detected Psychiatric:  The pt has Normal affect.   Non-Invasive Vascular Imaging:   ABI's/TBI's on 06/04/2019: Right: ABI 0.89, TBI 0.60 Left: ABI .0.83, TBI 0.63  Arterial duplex 06/04/2019: Right lower extremity CFA- BK pop bypass with 50-74% stenosis in the inflow artery. Distal anastomosis with velocities > 300 cm/s Left lower extremity CFA- BLK pop bypass with 50-70% stenosis in the inflow artery. Inflow velocity of 251 cm/s   Aortoiliac duplex 06/04/2019: > 50% stenosis throughout the right CIA and the proximal EIA. Velocities are lower on to days study at 228 in the CIA compared to 270 . Velocities in the EIA have increased from prior study from 243 to 311  > 50 % stenosis in the distal left EIA. Velocities have increased from 169 on prior study to 354.   Arterial duplex on 11/20/2018: Right  Graft #1: femoral to AK popliteal  +------------------+--------+---------------+----------+--------+           PSV cm/sStenosis    Waveform Comments  +------------------+--------+---------------+----------+--------+  Inflow      50           biphasic       +------------------+--------+---------------+----------+--------+  Prox Anastomosis 76           biphasic       +------------------+--------+---------------+----------+--------+  Proximal Graft  67           biphasic       +------------------+--------+---------------+----------+--------+  Mid Graft     55           biphasic       +------------------+--------+---------------+----------+--------+  Distal Graft   73           triphasic       +------------------+--------+---------------+----------+--------+  Distal Anastomosis65           monophasic      +------------------+--------+---------------+----------+--------+  Outflow      181   30-49% stenosistriphasic       +------------------+--------+---------------+----------+--------+      Left Graft #1: femoral to AK popliteal  +--------------------+--------+--------+----------+--------+            PSV cm/sStenosisWaveform Comments  +--------------------+--------+--------+----------+--------+  Inflow       169       monophasic      +--------------------+--------+--------+----------+--------+  Proximal Anastomosis43       biphasic       +--------------------+--------+--------+----------+--------+  Proximal Graft   52       biphasic       +--------------------+--------+--------+----------+--------+  Mid Graft      48       monophasic      +--------------------+--------+--------+----------+--------+  Distal Graft     43       monophasic      +--------------------+--------+--------+----------+--------+  Distal Anastamosis 43       monophasic      +--------------------+--------+--------+----------+--------+  Outflow       65       triphasic       +--------------------+--------+--------+----------+--------+   Summary:  Right Graft(s): Femoral to AK popliteal bypass graft visualized with a  30-49% stenosis noted. Unable to replictate higher velocity in the outflow  artery noted on previous exam.    Left Graft(s): Femoral  to AK popliteal bypass graft patent with no  evidence of stenosis noted.  Aortoiliac duplex 11/20/2018: Abdominal Aorta Findings:  +-------------+-------+----------+----------+----------+--------+--------+  Location   AP (cm)Trans (cm)PSV (cm/s)Waveform ThrombusComments  +-------------+-------+----------+----------+----------+--------+--------+  Distal             72    monophasic          +-------------+-------+----------+----------+----------+--------+--------+  RT CIA Prox          193    monophasic          +-------------+-------+----------+----------+----------+--------+--------+  RT CIA Mid           270    monophasic          +-------------+-------+----------+----------+----------+--------+--------+  RT CIA Distal         198    monophasic          +-------------+-------+----------+----------+----------+--------+--------+  RT EIA Prox          243    monophasic          +-------------+-------+----------+----------+----------+--------+--------+  RT EIA Mid           214    biphasic           +-------------+-------+----------+----------+----------+--------+--------+  LT CIA Prox          86    biphasic            +-------------+-------+----------+----------+----------+--------+--------+  LT CIA Mid           91    monophasic          +-------------+-------+----------+----------+----------+--------+--------+  LT CIA Distal         121    biphasic           +-------------+-------+----------+----------+----------+--------+--------+  LT EIA Mid           169    biphasic           +-------------+-------+----------+----------+----------+--------+--------+   Summary:  Stenosis: +------------------+---------------+  Location     Stent       +------------------+---------------+  Right Common Iliac50-99% stenosis  +------------------+---------------+  Left Common Iliac no stenosis    +------------------+---------------+   Previous ABI's/TBI's on 11/20/2018: Right:  0.86/0.57 (T) Left:  0.91/0.57 (M)   ASSESSMENT/PLAN:: 64 y.o. male here for follow up for  hx of bilateral CIA stents in 2015 by Dr. Bridgett Larsson as well as a left CFA to BK popliteal bypass 04/24/2013 by Dr. Bridgett Larsson.  He has hx of right iliofemoral endarterectomy with a right CFA to BK popliteal bypass with non reversed saphenous vein in 2016.   He has a stenosis in the right CIA stent is being followed. I have reviewed his noninvasive studies today and the right CIA stent stenosis appears to be stable. Bilateral common iliac stents are patent and bilateral lower extremity CFA to BK pop bypasses are patent as well. There is however new elevated velocities bilaterally in the EIAs. The right lower extremity bypass has increased stenosis at the distal anastomosis, which is new from prior study. The left lower extremity bypass additionally has increased velocities at both the proximal and distal anastomosis which has change from prior imaging in July 2020. Bilateral lower extremity ABIs/ TBIs are essentially unchanged. There is a slight decrease in the left  lower extremity from 0.91 to 0.83. The left there for appears to be trending down as it was 1.02 on 08/24/17. Despite some of these changes on the noninvasive studies today patients symptoms area overall stable. He does have some increased claudication symptoms but he is able to still  do the activities he wants to without trouble.I have encouraged him to continue taking his Crestor as well as other medications. He remains off any anti-platelet medications due to recent GI bleed.  I discussed with him planning another set of surveillance imaging in 6 months vs an arteriogram with possible intervention. I expressed my concerns especially in the setting of his renal transplant. He however is concerned about his legs becoming worse and limiting him more as he would like to continue working more. He has a follow up with his Nephrologist Dr. Clover Mealy on 06/06/19 and would like to discuss with her proceeding with an Angiogram. He will contact our office after his visit with his Nephrologist if he has any questions or concerns. If she feels that it is okay to proceed that is what he wishes to do. Will schedule Bilateral lower extremity Angiogram with possible intervention left lower extremity if okay from Nephrologist standpoint.    Paulo Fruit, PA-C [facility] [phone]  Clinic MD: Dr. Trula Slade

## 2019-06-04 ENCOUNTER — Other Ambulatory Visit: Payer: Self-pay

## 2019-06-04 ENCOUNTER — Ambulatory Visit (INDEPENDENT_AMBULATORY_CARE_PROVIDER_SITE_OTHER)
Admission: RE | Admit: 2019-06-04 | Discharge: 2019-06-04 | Disposition: A | Discharge status: Home / Self Care | Payer: Medicare Other | Source: Ambulatory Visit | Attending: Vascular Surgery | Admitting: Vascular Surgery

## 2019-06-04 ENCOUNTER — Ambulatory Visit (INDEPENDENT_AMBULATORY_CARE_PROVIDER_SITE_OTHER): Payer: Medicare Other | Admitting: Physician Assistant

## 2019-06-04 ENCOUNTER — Ambulatory Visit (HOSPITAL_COMMUNITY)
Admission: RE | Admit: 2019-06-04 | Discharge: 2019-06-04 | Disposition: A | Discharge status: Home / Self Care | Payer: Medicare Other | Source: Ambulatory Visit | Attending: Vascular Surgery | Admitting: Vascular Surgery

## 2019-06-04 VITALS — BP 129/77 | HR 85 | Temp 97.3°F | Resp 18 | Ht 70.0 in | Wt 157.0 lb

## 2019-06-04 DIAGNOSIS — I70211 Atherosclerosis of native arteries of extremities with intermittent claudication, right leg: Secondary | ICD-10-CM | POA: Diagnosis not present

## 2019-06-04 DIAGNOSIS — I739 Peripheral vascular disease, unspecified: Secondary | ICD-10-CM

## 2019-06-05 ENCOUNTER — Ambulatory Visit: Payer: Medicare Other | Attending: Physician Assistant | Admitting: Physical Therapy

## 2019-06-05 DIAGNOSIS — M25562 Pain in left knee: Secondary | ICD-10-CM | POA: Insufficient documentation

## 2019-06-05 DIAGNOSIS — G8929 Other chronic pain: Secondary | ICD-10-CM | POA: Insufficient documentation

## 2019-06-05 DIAGNOSIS — R2689 Other abnormalities of gait and mobility: Secondary | ICD-10-CM | POA: Insufficient documentation

## 2019-06-05 DIAGNOSIS — Z940 Kidney transplant status: Secondary | ICD-10-CM | POA: Diagnosis not present

## 2019-06-05 DIAGNOSIS — M25662 Stiffness of left knee, not elsewhere classified: Secondary | ICD-10-CM | POA: Insufficient documentation

## 2019-06-13 ENCOUNTER — Other Ambulatory Visit: Payer: Self-pay

## 2019-06-13 ENCOUNTER — Encounter: Payer: Self-pay | Admitting: Physical Therapy

## 2019-06-13 ENCOUNTER — Ambulatory Visit: Payer: Medicare Other | Admitting: Physical Therapy

## 2019-06-13 DIAGNOSIS — G8929 Other chronic pain: Secondary | ICD-10-CM | POA: Diagnosis not present

## 2019-06-13 DIAGNOSIS — Z940 Kidney transplant status: Secondary | ICD-10-CM | POA: Diagnosis not present

## 2019-06-13 DIAGNOSIS — R2689 Other abnormalities of gait and mobility: Secondary | ICD-10-CM

## 2019-06-13 DIAGNOSIS — M25662 Stiffness of left knee, not elsewhere classified: Secondary | ICD-10-CM | POA: Diagnosis not present

## 2019-06-13 DIAGNOSIS — D649 Anemia, unspecified: Secondary | ICD-10-CM | POA: Diagnosis not present

## 2019-06-13 DIAGNOSIS — I129 Hypertensive chronic kidney disease with stage 1 through stage 4 chronic kidney disease, or unspecified chronic kidney disease: Secondary | ICD-10-CM | POA: Diagnosis not present

## 2019-06-13 DIAGNOSIS — E7800 Pure hypercholesterolemia, unspecified: Secondary | ICD-10-CM | POA: Diagnosis not present

## 2019-06-13 DIAGNOSIS — M25562 Pain in left knee: Secondary | ICD-10-CM | POA: Diagnosis not present

## 2019-06-13 DIAGNOSIS — N319 Neuromuscular dysfunction of bladder, unspecified: Secondary | ICD-10-CM | POA: Diagnosis not present

## 2019-06-14 ENCOUNTER — Encounter: Payer: Self-pay | Admitting: Physical Therapy

## 2019-06-14 NOTE — Therapy (Signed)
[place], [place], [address] Phone: [phone]   Fax:  [phone]  Physical Therapy Evaluation  Patient Details  Name: Scott Chavez MRN: [id number] Date of Birth: 09-26-1955 Referring Provider (PT): Chriss Czar PA    Encounter Date: 06/13/2019  PT End of Session - 06/14/19 0720    Visit Number  1    Number of Visits  4    Authorization Type  [employer]    PT Start Time  0930    PT Stop Time  1013    PT Time Calculation (min)  43 min    Activity Tolerance  Patient tolerated treatment well    Behavior During Therapy  [facility] for tasks assessed/performed       Past Medical History:  Diagnosis Date  . ADHD   . Anemia    pt. not sure, but reports he is taking Fe for one month now.  . Aorto-iliac disease ([place])   . Bladder dysfunction   . ESRD (end stage renal disease) [facility])    trans[facility]- [facility] , followed by Dr. Moshe Cipro   . GERD (gastroesophageal reflux disease)   . Hemodialysis patient [facility])    "on dialysis 1998-2010" 04/25/2013)  . Hepatitis C    has been treated with Harvoni  . History of renal failure   . Hypercholesteremia   . Hypertension   . Internal hemorrhoids    Colonoscopy 2010 and anoscopy 2015  . Peripheral vascular disease ([place])   . Pneumonia   . Self-catheterizes urinary bladder    pt. choice, but he reports that he desires to do this to avoid retention     Past Surgical History:  Procedure Laterality Date  . ABDOMINAL AORTAGRAM N/A 03/09/2013   Procedure: ABDOMINAL Maxcine Ham;  Surgeon: Elam Dutch, MD;  Location: [facility] CATH LAB;  Service: Cardiovascular;  Laterality: N/A;  . AV FISTULA PLACEMENT Left 1998   "removed 2011" (04/25/2013)  . AV FISTULA REPAIR Left    removal with partial vein removed  . COLONOSCOPY    . ENDARTERECTOMY FEMORAL Right 04/17/2015   Procedure: ENDARTERECTOMY RIGHT ILIOFEMORAL WITH PROFUNDOPLASTY;  Surgeon: Conrad Tahoka,  MD;  Location: [place];  Service: Vascular;  Laterality: Right;  . ENDARTERECTOMY FEMORAL Right 10/25/2016   Procedure: RIGHT ILIOFEMORAL ENDARTERECTOMY WITH BOVINE PERICARDIAL PATCH ANGIOPLASTY;  Surgeon: Conrad Carlyle, MD;  Location: [place];  Service: Vascular;  Laterality: Right;  . ESOPHAGOGASTRODUODENOSCOPY (EGD) WITH PROPOFOL N/A 02/05/2019   Procedure: ESOPHAGOGASTRODUODENOSCOPY (EGD) WITH PROPOFOL;  Surgeon: Clarene Essex, MD;  Location: [facility] ENDOSCOPY;  Service: Endoscopy;  Laterality: N/A;  . FEMORAL-POPLITEAL BYPASS GRAFT Left 04/24/2013   Procedure: BYPASS GRAFT COMMON FEMORALARTERY-BELOW KNEE POPLITEAL ARTERY WITH GREATER SAPHENOUS VEIN;  Surgeon: Conrad Bettles, MD;  Location: [place];  Service: Vascular;  Laterality: Left;  . FEMORAL-POPLITEAL BYPASS GRAFT Right 04/17/2015   Procedure: BYPASS GRAFT RIGHT FEMORALTO BELOW KNEE POPLITEAL ARTERY USING RIGHT NON REVERSED GREATER SAPPHENOUS VEIN;  Surgeon: Conrad [place], MD;  Location: [place];  Service: Vascular;  Laterality: Right;  . INTRAOPERATIVE ARTERIOGRAM Left 04/24/2013   Procedure: INTRA OPERATIVE ARTERIOGRAM;  Surgeon: Conrad Montgomeryville, MD;  Location: [place];  Service: Vascular;  Laterality: Left;  . KIDNEY TRANSPLANT    . LOWER EXTREMITY ANGIOGRAM Bilateral 03/09/2013   Procedure: LOWER EXTREMITY ANGIOGRAM;  Surgeon: Elam Dutch, MD;  Location: [facility] CATH LAB;  Service: Cardiovascular;  Laterality: Bilateral;  . LOWER EXTREMITY ANGIOGRAM Right 10/25/2013  Procedure: LOWER EXTREMITY ANGIOGRAM;  Surgeon: Conrad [place], MD;  Location: [facility] CATH LAB;  Service: Cardiovascular;  Laterality: Right;  . LOWER EXTREMITY ANGIOGRAM Right 12/13/2013   Procedure: LOWER EXTREMITY ANGIOGRAM;  Surgeon: Conrad Pacheco, MD;  Location: [facility] CATH LAB;  Service: Cardiovascular;  Laterality: Right;  . NEPHRECTOMY RECIPIENT  2010  . NEPHRECTOMY TRANSPLANTED ORGAN    . PATCH ANGIOPLASTY Right 04/17/2015   Procedure: Rueben Bash PATCH ANGIOPLASTY, RIGHT ILEOFEMORAL;  Surgeon:  Conrad Dublin, MD;  Location: [place];  Service: Vascular;  Laterality: Right;  . PERIPHERAL VASCULAR CATHETERIZATION N/A 04/10/2015   Procedure: Abdominal Aortogram;  Surgeon: Conrad [place], MD;  Location: [place] CV LAB;  Service: Cardiovascular;  Laterality: N/A;  . PERIPHERAL VASCULAR CATHETERIZATION N/A 05/10/2016   Procedure: Abdominal Aortogram w/Lower Extremity;  Surgeon: Conrad Chapin, MD;  Location: [place] CV LAB;  Service: Cardiovascular;  Laterality: N/A;  . s/p cadaveric renal transplant  December 2010   [place] Left 09/24/2015   Procedure: EXCISION OF THROMBOSED RADIOCEPHALIC ARTERIOVENOUS FISTULA;  Surgeon: Conrad Blandburg, MD;  Location: [place];  Service: Vascular;  Laterality: Left;  Marland Kitchen VEIN HARVEST Right 04/17/2015   Procedure: RIGHT GREATER SAPPHENOUS VEIN HARVEST ;  Surgeon: Conrad Hamburg, MD;  Location: [place];  Service: Vascular;  Laterality: Right;    There were no vitals filed for this visit.   Subjective Assessment - 06/13/19 0941    Subjective  Patient was walking his dog and he slipped on some wet knees. He suffered a left MCL strain and avusion fx. The injury occured around Christmas. At this time he has pain with full extension and pain with ddep flexion. He works in a [employer] and needs to get back to work.    Pertinent History  PAD, Kidney transplant    Limitations  Standing;Walking    How long can you sit comfortably?  Pain when he flexes his leg underneath him    How long can you stand comfortably?  some days it gets sore    How long can you walk comfortably?  it is getting better but can continue to be sore    Currently in Pain?  Yes    Pain Score  4     Pain Location  Knee    Pain Orientation  Left    Pain Descriptors / Indicators  Aching    Pain Type  Chronic pain    Pain Radiating Towards  can radiate to the center of the knee    Pain Onset  More than a month ago    Pain Frequency  Constant    Aggravating Factors    straighrening his knee and bending his knee deep    Pain Relieving Factors  rest    Effect of Pain on Daily Activities  unable to work at this time         [facility] PT Assessment - 06/14/19 0001      Assessment   Medical Diagnosis  Left ACL strain     Referring Provider (PT)  Chriss Czar PA     Onset Date/Surgical Date  --   April 26 2019    Next MD Visit  06/29/2019    Prior Therapy  None       Precautions   Precautions  None      Restrictions   Weight Bearing Restrictions  No      Balance Screen   Has the  patient fallen in the past 6 months  No    Has the patient had a decrease in activity level because of a fear of falling?   No    Is the patient reluctant to leave their home because of a fear of falling?   No      Home Environment   Additional Comments  4 steps into his house       Prior Function   Level of Independence  Independent    Vocation  Full time employment    Vocation Requirements  works in a warehouse but hoping to change jobs     Leisure  fishing and walking his       Cognition   Overall Cognitive Status  Within Functional Limits for tasks assessed    Attention  Focused    Focused Attention  Appears intact    Memory  Appears intact    Awareness  Appears intact    Problem Solving  Appears intact      Observation/Other Assessments   Focus on Therapeutic Outcomes (FOTO)   [employer]       Sensation   Light Touch  Appears Intact      Coordination   Gross Motor Movements are Fluid and Coordinated  Yes    Fine Motor Movements are Fluid and Coordinated  Yes      AROM   Overall AROM Comments  Pain with end range extension and end range flexion L       PROM   Overall PROM Comments  pain with end range flexion and extension       Strength   Overall Strength Comments  right knee 5/5     Strength Assessment Site  Hip;Knee    Right/Left Hip  Left    Left Hip Flexion  4+/5    Left Hip ABduction  4+/5    Left Hip ADduction  5/5    Right/Left Knee   Left    Left Knee Flexion  4+/5    Left Knee Extension  4+/5      Palpation   Palpation comment  tedner to pallation in the medial knee       Ambulation/Gait   Gait Comments  mild deficit in single leg stance time on the right                 Objective measurements completed on examination: See above findings.      [facility] Adult PT Treatment/Exercise - 06/14/19 0001      Knee/Hip Exercises: Supine   Quad Sets Limitations  2x10 5 sec hold     Bridges Limitations  x10    Straight Leg Raises Limitations  x10       Manual Therapy   Manual therapy comments  reviewed self cross frixtion massage to MCL                PT Short Term Goals - 06/14/19 0715      PT SHORT TERM GOAL #1   Title  Patient will demonstrate full end range passive  knee extension without pain    Baseline  pain with end range knee extension    Time  3    Period  Weeks    Status  New    Target Date  07/05/19      PT SHORT TERM GOAL #2   Title  Patient will increase right single leg stance time    Baseline  uable to maintian right single  leg stance    Time  3    Period  Weeks    Status  New    Target Date  07/05/19      PT SHORT TERM GOAL #3   Title  Patient will demonstrate 5/5 gross left LE strength    Baseline  4+/5 knee extension and hip flexion    Time  3    Period  Weeks    Status  New    Target Date  07/05/19                Plan - 06/13/19 1034    Clinical Impression Statement  Patient is a 64 year old male S/P left MCL strain and avulsion fx around Decmeber 24th 2020. He continues to have pain with full extension and full flexion. He has minor weakness in his hip flexor compared to the right and decreased left single leg stance time. he is very motivated to return to work but will have to be able to lift boxes. He would benefit from skilled acute therapy to decrease pain and improve ability to return to work.    Personal Factors and Comorbidities  Comorbidity 1     Comorbidities  severe PAD, kidney disease    Examination-Activity Limitations  Locomotion Level;Squat;Lift;Stand    Examination-Participation Restrictions  Cleaning;Community Activity;Laundry    Stability/Clinical Decision Making  Stable/Uncomplicated    Clinical Decision Making  Low    Rehab Potential  Good    PT Frequency  1x / week    PT Duration  3 weeks   per Medic[employer] restrictions   PT Treatment/Interventions  ADLs/Self Care Home Management;Cryotherapy;Electrical Stimulation;Iontophoresis 4mg /ml Dexamethasone;Moist Heat;DME Instruction;Stair training;Gait training;Therapeutic activities;Therapeutic exercise;Neuromuscular re-education;Patient/family education;Manual techniques;Passive range of motion;Dry needling    PT Next Visit Plan  manual therapy to lateral knee, cosndier anti inflamatory modialites, per script has an avulsion so avoid ultrasound; pain with end range extension; progress to single leg stability exercises as able, review HEP    PT Home Exercise Plan  quad sets, SLR, bridging    Consulted and Agree with Plan of Care  Patient       Patient will benefit from skilled therapeutic intervention in order to improve the following deficits and impairments:  Abnormal gait, Decreased range of motion, Difficulty walking, Decreased activity tolerance, Decreased strength, Pain  Visit Diagnosis: Chronic pain of left knee  Other abnormalities of gait and mobility  Stiffness of left knee, not elsewhere classified     Problem List Patient Active Problem List   Diagnosis Date Noted  . Acute upper GI bleeding 02/05/2019  . GI bleeding 02/04/2019  . AKI (acute kidney injury) ([place]) 02/04/2019  . Iron deficiency anemia 04/06/2017  . Peripheral arterial disease ([place]) 10/25/2016  . Chronic neck pain 03/15/2016  . Chronic low back pain without sciatica 03/15/2016  . Nonallopathic lesion of lumbosacral region 03/15/2016  . Nonallopathic lesion of sacral region 03/15/2016  .  Nonallopathic lesion of thoracic region 03/15/2016  . Liver fibrosis 12/18/2015  . Arteriovenous fistula thrombosis ([place]) 10/16/2015  . Chronic hepatitis C without hepatic coma ([place]) 09/03/2015  . Atherosclerosis of native arteries of extremities with intermittent claudication, right leg ([place]) 04/17/2015  . Pain in joint, lower leg 09/13/2014  . Hemorrhoids, internal, with bleeding 01/29/2014  . Pain in limb 08/17/2013  . Peripheral vascular disease, unspecified ([place]) 06/08/2013  . Redness of skin-Calf to foot LEFT 05/25/2013  . Tenderness in limb-Calf to foot LEFT 05/25/2013  . Swelling of limb-Calf  to foot LEFT 05/25/2013  . Postoperative pain 05/02/2013  . PAD (peripheral artery disease) ([place]) 04/24/2013  . Preop cardiovascular exam 03/27/2013  . Essential hypertension 03/27/2013  . Hyperlipidemia 03/27/2013  . Atherosclerosis of native arteries of extremity with intermittent claudication ([place]) 02/16/2013  . History of renal transplant 02/16/2013  . Neurogenic dysfunction of the urinary bladder 20-Apr-2011  . Deceased-donor kidney transplant recipient 20-Apr-2011    Carney Living PT DPT  06/14/2019, 7:22 AM  [facility] [address] [place], [place], [address] Phone: [phone]   Fax:  [phone]  Name: Scott Chavez MRN: [id number] Date of Birth: [birth year]

## 2019-06-20 DIAGNOSIS — E785 Hyperlipidemia, unspecified: Secondary | ICD-10-CM | POA: Diagnosis not present

## 2019-06-20 DIAGNOSIS — I10 Essential (primary) hypertension: Secondary | ICD-10-CM | POA: Diagnosis not present

## 2019-06-20 DIAGNOSIS — Z940 Kidney transplant status: Secondary | ICD-10-CM | POA: Diagnosis not present

## 2019-06-20 DIAGNOSIS — G4700 Insomnia, unspecified: Secondary | ICD-10-CM | POA: Diagnosis not present

## 2019-06-26 ENCOUNTER — Other Ambulatory Visit: Payer: Self-pay

## 2019-06-26 ENCOUNTER — Encounter: Payer: Self-pay | Admitting: Physical Therapy

## 2019-06-26 ENCOUNTER — Ambulatory Visit: Payer: Medicare Other | Admitting: Physical Therapy

## 2019-06-26 DIAGNOSIS — M25562 Pain in left knee: Secondary | ICD-10-CM | POA: Diagnosis not present

## 2019-06-26 DIAGNOSIS — M25662 Stiffness of left knee, not elsewhere classified: Secondary | ICD-10-CM

## 2019-06-26 DIAGNOSIS — R2689 Other abnormalities of gait and mobility: Secondary | ICD-10-CM | POA: Diagnosis not present

## 2019-06-26 DIAGNOSIS — G8929 Other chronic pain: Secondary | ICD-10-CM

## 2019-06-27 ENCOUNTER — Encounter: Payer: Self-pay | Admitting: Physical Therapy

## 2019-06-27 NOTE — Therapy (Signed)
[place], [place], [address] Phone: [phone]   Fax:  [phone]  Physical Therapy Treatment/Re-cert   Patient Details  Name: Scott Chavez MRN: [id number] Date of Birth: Aug 09, 1955 Referring Provider (PT): Chriss Czar PA    Encounter Date: 06/26/2019  PT End of Session - 06/26/19 1129    Visit Number  2    Number of Visits  4    Date for PT Re-Evaluation  08/08/19    Authorization Type  [employer] MCR    PT Start Time  5465   patient 11 min late   PT Stop Time  1150    PT Time Calculation (min)  39 min    Activity Tolerance  Patient tolerated treatment well    Behavior During Therapy  [facility] for tasks assessed/performed       Past Medical History:  Diagnosis Date  . ADHD   . Anemia    pt. not sure, but reports he is taking Fe for one month now.  . Aorto-iliac disease ([place])   . Bladder dysfunction   . ESRD (end stage renal disease) [facility])    trans[facility]- [facility] , followed by Dr. Moshe Cipro   . GERD (gastroesophageal reflux disease)   . Hemodialysis patient [facility])    "on dialysis 1998-2010" 04/25/2013)  . Hepatitis C    has been treated with Harvoni  . History of renal failure   . Hypercholesteremia   . Hypertension   . Internal hemorrhoids    Colonoscopy 2010 and anoscopy 2015  . Peripheral vascular disease ([place])   . Pneumonia   . Self-catheterizes urinary bladder    pt. choice, but he reports that he desires to do this to avoid retention     Past Surgical History:  Procedure Laterality Date  . ABDOMINAL AORTAGRAM N/A 03/09/2013   Procedure: ABDOMINAL Maxcine Ham;  Surgeon: Elam Dutch, MD;  Location: [facility] CATH LAB;  Service: Cardiovascular;  Laterality: N/A;  . AV FISTULA PLACEMENT Left 1998   "removed 2011" (04/25/2013)  . AV FISTULA REPAIR Left    removal with partial vein removed  . COLONOSCOPY    . ENDARTERECTOMY FEMORAL Right 04/17/2015   Procedure:  ENDARTERECTOMY RIGHT ILIOFEMORAL WITH PROFUNDOPLASTY;  Surgeon: Conrad Kemper, MD;  Location: [place];  Service: Vascular;  Laterality: Right;  . ENDARTERECTOMY FEMORAL Right 10/25/2016   Procedure: RIGHT ILIOFEMORAL ENDARTERECTOMY WITH BOVINE PERICARDIAL PATCH ANGIOPLASTY;  Surgeon: Conrad Demarest, MD;  Location: [place];  Service: Vascular;  Laterality: Right;  . ESOPHAGOGASTRODUODENOSCOPY (EGD) WITH PROPOFOL N/A 02/05/2019   Procedure: ESOPHAGOGASTRODUODENOSCOPY (EGD) WITH PROPOFOL;  Surgeon: Clarene Essex, MD;  Location: [facility] ENDOSCOPY;  Service: Endoscopy;  Laterality: N/A;  . FEMORAL-POPLITEAL BYPASS GRAFT Left 04/24/2013   Procedure: BYPASS GRAFT COMMON FEMORALARTERY-BELOW KNEE POPLITEAL ARTERY WITH GREATER SAPHENOUS VEIN;  Surgeon: Conrad Arcola, MD;  Location: [place];  Service: Vascular;  Laterality: Left;  . FEMORAL-POPLITEAL BYPASS GRAFT Right 04/17/2015   Procedure: BYPASS GRAFT RIGHT FEMORALTO BELOW KNEE POPLITEAL ARTERY USING RIGHT NON REVERSED GREATER SAPPHENOUS VEIN;  Surgeon: Conrad Hemlock, MD;  Location: [place];  Service: Vascular;  Laterality: Right;  . INTRAOPERATIVE ARTERIOGRAM Left 04/24/2013   Procedure: INTRA OPERATIVE ARTERIOGRAM;  Surgeon: Conrad , MD;  Location: [place];  Service: Vascular;  Laterality: Left;  . KIDNEY TRANSPLANT    . LOWER EXTREMITY ANGIOGRAM Bilateral 03/09/2013   Procedure: LOWER EXTREMITY ANGIOGRAM;  Surgeon: Elam Dutch, MD;  Location: [facility] CATH  LAB;  Service: Cardiovascular;  Laterality: Bilateral;  . LOWER EXTREMITY ANGIOGRAM Right 10/25/2013   Procedure: LOWER EXTREMITY ANGIOGRAM;  Surgeon: Conrad Cicero, MD;  Location: [facility] CATH LAB;  Service: Cardiovascular;  Laterality: Right;  . LOWER EXTREMITY ANGIOGRAM Right 12/13/2013   Procedure: LOWER EXTREMITY ANGIOGRAM;  Surgeon: Conrad Huntingdon, MD;  Location: [facility] CATH LAB;  Service: Cardiovascular;  Laterality: Right;  . NEPHRECTOMY RECIPIENT  2010  . NEPHRECTOMY TRANSPLANTED ORGAN    . PATCH ANGIOPLASTY Right  04/17/2015   Procedure: Rueben Bash PATCH ANGIOPLASTY, RIGHT ILEOFEMORAL;  Surgeon: Conrad Lankin, MD;  Location: [place];  Service: Vascular;  Laterality: Right;  . PERIPHERAL VASCULAR CATHETERIZATION N/A 04/10/2015   Procedure: Abdominal Aortogram;  Surgeon: Conrad Rockton, MD;  Location: [place] CV LAB;  Service: Cardiovascular;  Laterality: N/A;  . PERIPHERAL VASCULAR CATHETERIZATION N/A 05/10/2016   Procedure: Abdominal Aortogram w/Lower Extremity;  Surgeon: Conrad Nicholas, MD;  Location: [place] CV LAB;  Service: Cardiovascular;  Laterality: N/A;  . s/p cadaveric renal transplant  December 2010   [place] Left 09/24/2015   Procedure: EXCISION OF THROMBOSED RADIOCEPHALIC ARTERIOVENOUS FISTULA;  Surgeon: Conrad Brookhaven, MD;  Location: [place];  Service: Vascular;  Laterality: Left;  Marland Kitchen VEIN HARVEST Right 04/17/2015   Procedure: RIGHT GREATER SAPPHENOUS VEIN HARVEST ;  Surgeon: Conrad Greenfield, MD;  Location: [place];  Service: Vascular;  Laterality: Right;    There were no vitals filed for this visit.  Subjective Assessment - 06/27/19 1302    Subjective  Patient reports his knee continues to be sore. He has tried a few of the exercises. He is supposed to start a new job soon.    Pertinent History  PAD, Kidney transplant    Limitations  Standing;Walking    How long can you sit comfortably?  Pain when he flexes his leg underneath him    How long can you stand comfortably?  some days it gets sore    How long can you walk comfortably?  it is getting better but can continue to be sore    Currently in Pain?  Yes    Pain Score  4     Pain Location  Knee    Pain Orientation  Left    Pain Descriptors / Indicators  Aching    Pain Type  Chronic pain    Pain Radiating Towards  can radiate towards the knee    Pain Onset  More than a month ago    Pain Frequency  Constant    Aggravating Factors   straightening the knee and bending the knee    Pain Relieving Factors  rest     Effect of Pain on Daily Activities  unable to work at this time                       [facility] Adult PT Treatment/Exercise - 06/27/19 0001      Knee/Hip Exercises: Standing   Heel Raises Limitations  x20     Hip Flexion Limitations  standing march 2x10 with cuing for weight bearing on the left     Abduction Limitations  x10 bilateral     Extension Limitations  x10 bilateral       Knee/Hip Exercises: Supine   Quad Sets Limitations  2x10 5 sec hold     Short [employer] Limitations  x15     Bridges Limitations  x20  Straight Leg Raises Limitations  x20       Manual Therapy   Manual Therapy  Soft tissue mobilization;Joint mobilization    Joint Mobilization  Grade 1 and II PA and AP glides     Soft tissue mobilization  TFM to left MCL;              PT Education - 06/26/19 1128    Education Details  reviewed HEP and symptom mangement    Person(s) Educated  Patient    Methods  Explanation;Demonstration;Tactile cues    Comprehension  Verbalized understanding;Returned demonstration;Verbal cues required;Tactile cues required       PT Short Term Goals - 06/27/19 1315      PT SHORT TERM GOAL #1   Title  Patient will demonstrate full end range passive  knee extension without pain    Baseline  pain with end range knee extension    Time  3    Period  Weeks    Status  On-going    [place] Date  07/05/19      PT SHORT TERM GOAL #2   Title  Patient will increase right single leg stance time    Baseline  uable to maintian right single leg stance    Time  3    Period  Weeks    Status  On-going      PT SHORT TERM GOAL #3   Title  Patient will demonstrate 5/5 gross left LE strength    Baseline  4+/5 knee extension and hip flexion    Time  3    Period  Weeks    Status  On-going        PT Long Term Goals - 06/27/19 1327      PT LONG TERM GOAL #1   Title  Patient will bend to pick item off the ground withoutpain in order to return to work    Time  3     Period  Weeks    Status  New    [place] Date  08/08/19      PT LONG TERM GOAL #2   Title  Patient will stand for 1 hour without pain in order perfrom work tasks.    Time  6    Period  Weeks    Status  New    Target Date  08/08/19            Plan - 06/27/19 1310    Clinical Impression Statement  Patient tolerated treatment well. ZHe had minor pain with right leg abduction. Therapy perfromed manual therapy to lateral knee. We will re-adjust the patients frequency. The patient initally had Medic[employer] but now has Medicare. he would like to get more agressive with his rehab. He was advised to xontinue strengthening and perfroming exercises that don't increase inflammation. Therapy will potentially perfrom [place] next visit if patient continues to have irritation.    Personal Factors and Comorbidities  Comorbidity 1    Comorbidities  severe PAD, kidney disease    Examination-Activity Limitations  Locomotion Level;Squat;Lift;Stand    Stability/Clinical Decision Making  Stable/Uncomplicated    Clinical Decision Making  Low    Rehab Potential  Good    PT Frequency  2x / week    PT Duration  6 weeks    PT Treatment/Interventions  ADLs/Self Care Home Management;Cryotherapy;Electrical Stimulation;Iontophoresis 4mg /ml Dexamethasone;Moist Heat;DME Instruction;Stair training;Gait training;Therapeutic activities;Therapeutic exercise;Neuromuscular re-education;Patient/family education;Manual techniques;Passive range of motion;Dry needling    PT Next Visit Plan  manual therapy to  lateral knee, cosndier anti inflamatory modialites, per script has an avulsion so avoid ultrasound; pain with end range extension; progress to single leg stability exercises as able, review HEP    PT Home Exercise Plan  quad sets, SLR, bridging    Consulted and Agree with Plan of Care  Patient       Patient will benefit from skilled therapeutic intervention in order to improve the following deficits and impairments:  Abnormal  gait, Decreased range of motion, Difficulty walking, Decreased activity tolerance, Decreased strength, Pain  Visit Diagnosis: Chronic pain of left knee - Plan: PT plan of care cert/re-cert  Other abnormalities of gait and mobility - Plan: PT plan of care cert/re-cert  Stiffness of left knee, not elsewhere classified - Plan: PT plan of care cert/re-cert     Problem List Patient Active Problem List   Diagnosis Date Noted  . Acute upper GI bleeding 02/05/2019  . GI bleeding 02/04/2019  . AKI (acute kidney injury) ([place]) 02/04/2019  . Iron deficiency anemia 04/06/2017  . Peripheral arterial disease ([place]) 10/25/2016  . Chronic neck pain 03/15/2016  . Chronic low back pain without sciatica 03/15/2016  . Nonallopathic lesion of lumbosacral region 03/15/2016  . Nonallopathic lesion of sacral region 03/15/2016  . Nonallopathic lesion of thoracic region 03/15/2016  . Liver fibrosis 12/18/2015  . Arteriovenous fistula thrombosis ([place]) 10/16/2015  . Chronic hepatitis C without hepatic coma ([place]) 09/03/2015  . Atherosclerosis of native arteries of extremities with intermittent claudication, right leg ([place]) 04/17/2015  . Pain in joint, lower leg 09/13/2014  . Hemorrhoids, internal, with bleeding 01/29/2014  . Pain in limb 08/17/2013  . Peripheral vascular disease, unspecified ([place]) 06/08/2013  . Redness of skin-Calf to foot LEFT 05/25/2013  . Tenderness in limb-Calf to foot LEFT 05/25/2013  . Swelling of limb-Calf to foot LEFT 05/25/2013  . Postoperative pain 05/02/2013  . PAD (peripheral artery disease) ([place]) 04/24/2013  . Preop cardiovascular exam 03/27/2013  . Essential hypertension 03/27/2013  . Hyperlipidemia 03/27/2013  . Atherosclerosis of native arteries of extremity with intermittent claudication ([place]) 02/16/2013  . History of renal transplant 02/16/2013  . Neurogenic dysfunction of the urinary bladder 04/24/2011  . Deceased-donor kidney transplant recipient 2011-04-24     Carney Living PT DPT  06/27/2019, 1:29 PM  [facility] [address] [place], [place], [address] Phone: [phone]   Fax:  [phone]  Name: CEPHUS TUPY MRN: [id number] Date of Birth: May 14, 1955

## 2019-06-28 DIAGNOSIS — M25562 Pain in left knee: Secondary | ICD-10-CM | POA: Diagnosis not present

## 2019-06-29 ENCOUNTER — Ambulatory Visit: Payer: Medicare Other | Admitting: Cardiology

## 2019-06-29 ENCOUNTER — Ambulatory Visit: Payer: Medicare Other | Admitting: Internal Medicine

## 2019-06-29 ENCOUNTER — Encounter: Payer: Self-pay | Admitting: Cardiology

## 2019-06-29 ENCOUNTER — Other Ambulatory Visit: Payer: Self-pay

## 2019-06-29 VITALS — BP 160/90 | HR 69 | Temp 97.4°F | Ht 70.0 in | Wt 153.0 lb

## 2019-06-29 DIAGNOSIS — Z01810 Encounter for preprocedural cardiovascular examination: Secondary | ICD-10-CM | POA: Diagnosis not present

## 2019-06-29 DIAGNOSIS — Z8719 Personal history of other diseases of the digestive system: Secondary | ICD-10-CM

## 2019-06-29 DIAGNOSIS — I340 Nonrheumatic mitral (valve) insufficiency: Secondary | ICD-10-CM | POA: Diagnosis not present

## 2019-06-29 DIAGNOSIS — I10 Essential (primary) hypertension: Secondary | ICD-10-CM

## 2019-06-29 DIAGNOSIS — I739 Peripheral vascular disease, unspecified: Secondary | ICD-10-CM | POA: Diagnosis not present

## 2019-06-29 DIAGNOSIS — Z940 Kidney transplant status: Secondary | ICD-10-CM

## 2019-06-29 DIAGNOSIS — I6523 Occlusion and stenosis of bilateral carotid arteries: Secondary | ICD-10-CM
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
## 2019-07-03 ENCOUNTER — Other Ambulatory Visit: Payer: Self-pay

## 2019-07-03 ENCOUNTER — Ambulatory Visit: Payer: Medicare Other | Attending: Physician Assistant | Admitting: Physical Therapy

## 2019-07-03 ENCOUNTER — Encounter: Payer: Self-pay | Admitting: Physical Therapy

## 2019-07-03 ENCOUNTER — Ambulatory Visit: Payer: Medicare Other | Admitting: Physical Therapy

## 2019-07-03 DIAGNOSIS — G8929 Other chronic pain: Secondary | ICD-10-CM

## 2019-07-03 DIAGNOSIS — M25662 Stiffness of left knee, not elsewhere classified: Secondary | ICD-10-CM

## 2019-07-03 DIAGNOSIS — R2689 Other abnormalities of gait and mobility: Secondary | ICD-10-CM | POA: Diagnosis not present

## 2019-07-03 DIAGNOSIS — M25562 Pain in left knee: Secondary | ICD-10-CM | POA: Diagnosis not present

## 2019-07-03 NOTE — Patient Instructions (Signed)

## 2019-07-03 NOTE — Therapy (Addendum)
[place], [place], [address] Phone: [phone]   Fax:  [phone]  Physical Therapy Treatment/Discharge   Patient Details  Name: Scott Chavez MRN: [id number] Date of Birth: Nov 13, 1955 Referring Provider (PT): Chriss Czar PA    Encounter Date: 07/03/2019  PT End of Session - 07/03/19 1134    Visit Number  3    Number of Visits  12    Date for PT Re-Evaluation  08/08/19    Authorization Type  [employer] MCR    PT Start Time  1130    PT Stop Time  1210    PT Time Calculation (min)  40 min       Past Medical History:  Diagnosis Date  . ADHD   . Anemia    pt. not sure, but reports he is taking Fe for one month now.  . Aorto-iliac disease ([place])   . Bladder dysfunction   . ESRD (end stage renal disease) [facility])    transplant- 12/13/2010[facility] , followed by Dr. Moshe Cipro   . GERD (gastroesophageal reflux disease)   . Hemodialysis patient [facility])    "on dialysis 1998-2010" 04/25/2013)  . Hepatitis C    has been treated with Harvoni  . History of renal failure   . Hypercholesteremia   . Hypertension   . Internal hemorrhoids    Colonoscopy 2010 and anoscopy 2015  . Peripheral vascular disease ([place])   . Pneumonia   . Self-catheterizes urinary bladder    pt. choice, but he reports that he desires to do this to avoid retention     Past Surgical History:  Procedure Laterality Date  . ABDOMINAL AORTAGRAM N/A 03/09/2013   Procedure: ABDOMINAL Maxcine Ham;  Surgeon: Elam Dutch, MD;  Location: [facility] CATH LAB;  Service: Cardiovascular;  Laterality: N/A;  . AV FISTULA PLACEMENT Left 1998   "removed 2011" (04/25/2013)  . AV FISTULA REPAIR Left    removal with partial vein removed  . COLONOSCOPY    . ENDARTERECTOMY FEMORAL Right 04/17/2015   Procedure: ENDARTERECTOMY RIGHT ILIOFEMORAL WITH PROFUNDOPLASTY;  Surgeon: Conrad Falmouth, MD;  Location: [place];  Service: Vascular;  Laterality: Right;  .  ENDARTERECTOMY FEMORAL Right 10/25/2016   Procedure: RIGHT ILIOFEMORAL ENDARTERECTOMY WITH BOVINE PERICARDIAL PATCH ANGIOPLASTY;  Surgeon: Conrad Escalante, MD;  Location: [place];  Service: Vascular;  Laterality: Right;  . ESOPHAGOGASTRODUODENOSCOPY (EGD) WITH PROPOFOL N/A 02/05/2019   Procedure: ESOPHAGOGASTRODUODENOSCOPY (EGD) WITH PROPOFOL;  Surgeon: Clarene Essex, MD;  Location: [facility] ENDOSCOPY;  Service: Endoscopy;  Laterality: N/A;  . FEMORAL-POPLITEAL BYPASS GRAFT Left 04/24/2013   Procedure: BYPASS GRAFT COMMON FEMORALARTERY-BELOW KNEE POPLITEAL ARTERY WITH GREATER SAPHENOUS VEIN;  Surgeon: Conrad Flemington, MD;  Location: [place];  Service: Vascular;  Laterality: Left;  . FEMORAL-POPLITEAL BYPASS GRAFT Right 04/17/2015   Procedure: BYPASS GRAFT RIGHT FEMORALTO BELOW KNEE POPLITEAL ARTERY USING RIGHT NON REVERSED GREATER SAPPHENOUS VEIN;  Surgeon: Conrad Cherry Hill Mall, MD;  Location: [place];  Service: Vascular;  Laterality: Right;  . INTRAOPERATIVE ARTERIOGRAM Left 04/24/2013   Procedure: INTRA OPERATIVE ARTERIOGRAM;  Surgeon: Conrad Woodmere, MD;  Location: [place];  Service: Vascular;  Laterality: Left;  . KIDNEY TRANSPLANT    . LOWER EXTREMITY ANGIOGRAM Bilateral 03/09/2013   Procedure: LOWER EXTREMITY ANGIOGRAM;  Surgeon: Elam Dutch, MD;  Location: [facility] CATH LAB;  Service: Cardiovascular;  Laterality: Bilateral;  . LOWER EXTREMITY ANGIOGRAM Right 10/25/2013   Procedure: LOWER EXTREMITY ANGIOGRAM;  Surgeon: Conrad Copper Center, MD;  Location: [place] CATH LAB;  Service: Cardiovascular;  Laterality: Right;  . LOWER EXTREMITY ANGIOGRAM Right 12/13/2013   Procedure: LOWER EXTREMITY ANGIOGRAM;  Surgeon: Conrad Hudson, MD;  Location: [facility] CATH LAB;  Service: Cardiovascular;  Laterality: Right;  . NEPHRECTOMY RECIPIENT  2010  . NEPHRECTOMY TRANSPLANTED ORGAN    . PATCH ANGIOPLASTY Right 04/17/2015   Procedure: Rueben Bash PATCH ANGIOPLASTY, RIGHT ILEOFEMORAL;  Surgeon: Conrad Le Claire, MD;  Location: [place];  Service: Vascular;  Laterality:  Right;  . PERIPHERAL VASCULAR CATHETERIZATION N/A 04/10/2015   Procedure: Abdominal Aortogram;  Surgeon: Conrad Midvale, MD;  Location: [place] CV LAB;  Service: Cardiovascular;  Laterality: N/A;  . PERIPHERAL VASCULAR CATHETERIZATION N/A 05/10/2016   Procedure: Abdominal Aortogram w/Lower Extremity;  Surgeon: Conrad Aurora, MD;  Location: [place] CV LAB;  Service: Cardiovascular;  Laterality: N/A;  . s/p cadaveric renal transplant  December 2010   [place] Left 09/24/2015   Procedure: EXCISION OF THROMBOSED RADIOCEPHALIC ARTERIOVENOUS FISTULA;  Surgeon: Conrad Putney, MD;  Location: [place];  Service: Vascular;  Laterality: Left;  Marland Kitchen VEIN HARVEST Right 04/17/2015   Procedure: RIGHT GREATER SAPPHENOUS VEIN HARVEST ;  Surgeon: Conrad , MD;  Location: [place];  Service: Vascular;  Laterality: Right;    There were no vitals filed for this visit.  Subjective Assessment - 07/03/19 1133    Subjective  The pain lets me know its there. Mild pain today.    Currently in Pain?  Yes    Pain Score  --   mild pain   Pain Location  Knee    Pain Orientation  Left    Pain Descriptors / Indicators  Aching    Pain Type  Chronic pain                       [facility] Adult PT Treatment/Exercise - 07/03/19 0001      Knee/Hip Exercises: Stretches   Active Hamstring Stretch  20 seconds;3 reps    Hip Flexor Stretch  3 reps;30 seconds    Hip Flexor Stretch Limitations  left off edge of mat       Knee/Hip Exercises: Standing   Heel Raises Limitations  x20     Hip Flexion Limitations  standing march 2x10 with cuing for weight bearing on the left     Abduction Limitations  x10 bilateral     Extension Limitations  x10 bilateral     Lateral Step Up  10 reps;Hand Hold: 1;Step Height: 6"    Forward Step Up  10 reps;Hand Hold: 1;Step Height: 6"      Knee/Hip Exercises: Seated   Long Arc Quad  20 reps      Knee/Hip Exercises: Supine   Quad Sets Limitations  20  reps     Short Arc Quad Sets Limitations  x20     Bridges Limitations  x20     Straight Leg Raises Limitations  x20       Modalities   Modalities  Iontophoresis      Iontophoresis   Type of Iontophoresis  Dexamethasone    Location  left knee medial joint line     Dose  1 ml    Time  6 hour patch       Manual Therapy   Joint Mobilization  patella mobs STW to patellar fat pad, medial              PT  Education - 07/03/19 1210    Education Details  IONTO contraindications and precautions    Person(s) Educated  Patient    Methods  Explanation;Handout    Comprehension  Verbalized understanding       PT Short Term Goals - 06/27/19 1315      PT SHORT TERM GOAL #1   Title  Patient will demonstrate full end range passive  knee extension without pain    Baseline  pain with end range knee extension    Time  3    Period  Weeks    Status  On-going    Target Date  07/05/19      PT SHORT TERM GOAL #2   Title  Patient will increase right single leg stance time    Baseline  uable to maintian right single leg stance    Time  3    Period  Weeks    Status  On-going      PT SHORT TERM GOAL #3   Title  Patient will demonstrate 5/5 gross left LE strength    Baseline  4+/5 knee extension and hip flexion    Time  3    Period  Weeks    Status  On-going        PT Long Term Goals - 06/27/19 1327      PT LONG TERM GOAL #1   Title  Patient will bend to pick item off the ground withoutpain in order to return to work    Time  3    Period  Weeks    Status  New    Target Date  08/08/19      PT LONG TERM GOAL #2   Title  Patient will stand for 1 hour without pain in order perfrom work tasks.    Time  6    Period  Weeks    Status  New    Target Date  08/08/19            Plan - 07/03/19 1149    Clinical Impression Statement  Pt tolerating session well. Min increased pain with step ups. Otherwise continued with previous strengthening exercises. Trial of ionto to medial  joint line. He is also having tenderness at medial fat pad. Overall reports improvement. He was able to go fishing and navigate unlevel terrain without much pain yesterday.    PT Next Visit Plan  assess ionto/ manual therapy to medial knee, cosndier anti inflamatory modialites, per script has an avulsion so avoid ultrasound; pain with end range extension; progress to single leg stability exercises as able, review HEP    PT Home Exercise Plan  quad sets, SLR, bridging       Patient will benefit from skilled therapeutic intervention in order to improve the following deficits and impairments:  Abnormal gait, Decreased range of motion, Difficulty walking, Decreased activity tolerance, Decreased strength, Pain  Visit Diagnosis: Chronic pain of left knee  Other abnormalities of gait and mobility  Stiffness of left knee, not elsewhere classified    PHYSICAL THERAPY DISCHARGE SUMMARY  Visits from Start of Care: 3  Current functional level related to goals / functional outcomes: Did not return for follow up    Remaining deficits: Unknown    Education / Equipment: Has basic HEP   Plan: Patient agrees to discharge.  Patient goals were not met. Patient is being discharged due to meeting the stated rehab goals.  ?????      Problem List Patient Active Problem  List   Diagnosis Date Noted  . Acute upper GI bleeding 02/05/2019  . GI bleeding 02/04/2019  . AKI (acute kidney injury) ([place]) 02/04/2019  . Iron deficiency anemia 04/06/2017  . Peripheral arterial disease ([place]) 10/25/2016  . Chronic neck pain 03/15/2016  . Chronic low back pain without sciatica 03/15/2016  . Nonallopathic lesion of lumbosacral region 03/15/2016  . Nonallopathic lesion of sacral region 03/15/2016  . Nonallopathic lesion of thoracic region 03/15/2016  . Liver fibrosis 12/18/2015  . Arteriovenous fistula thrombosis ([place]) 10/16/2015  . Chronic hepatitis C without hepatic coma ([place]) 09/03/2015  . Atherosclerosis  of native arteries of extremities with intermittent claudication, right leg ([place]) 04/17/2015  . Pain in joint, lower leg 09/13/2014  . Hemorrhoids, internal, with bleeding 01/29/2014  . Pain in limb 08/17/2013  . Peripheral vascular disease, unspecified ([place]) 06/08/2013  . Redness of skin-Calf to foot LEFT 05/25/2013  . Tenderness in limb-Calf to foot LEFT 05/25/2013  . Swelling of limb-Calf to foot LEFT 05/25/2013  . Postoperative pain 05/02/2013  . PAD (peripheral artery disease) ([place]) 04/24/2013  . Preop cardiovascular exam 03/27/2013  . Essential hypertension 03/27/2013  . Hyperlipidemia 03/27/2013  . Atherosclerosis of native arteries of extremity with intermittent claudication ([place]) 02/16/2013  . History of renal transplant 02/16/2013  . Neurogenic dysfunction of the urinary bladder 27-Apr-2011  . Deceased-donor kidney transplant recipient April 27, 2011   Carolyne Littles PT DPT  10/11/2019  Dorene Ar, PTA 07/03/2019, 12:16 PM  [facility] [address] [place], [place], [address] Phone: [phone]   Fax:  [phone]  Name: Scott Chavez MRN: [id number] Date of Birth: 1956-02-12

## 2019-07-04 ENCOUNTER — Ambulatory Visit: Payer: Medicare Other

## 2019-07-04 ENCOUNTER — Ambulatory Visit: Payer: Medicare Other | Admitting: Cardiology

## 2019-07-04 VITALS — BP 115/72 | HR 88 | Temp 98.8°F

## 2019-07-04 DIAGNOSIS — I10 Essential (primary) hypertension: Secondary | ICD-10-CM

## 2019-07-04 NOTE — Progress Notes (Signed)
Vitals with BMI 07/04/2019 07/04/2019 06/29/2019  Height - - -  Weight - - -  BMI - - -  Systolic 092 330 076  Diastolic 72 71 90  Pulse 88 97 -      I had requested patient command today for nurse blood pressure check as I have noted his blood pressure has been occasionally elevated at other appointments and was elevated at his last office visit.  His blood pressure is well controlled today.  He admits he has not been checking his blood pressure regularly, we will set him up for remote blood pressure monitoring for better assessment.  Continue with his present medications.  He has multiple concerns today regarding his overall health that were difficult to differentiate. Negative nuclear stress test in 2019 and echo shows normal LVEF. He denies any exertional chest pain or shortness of breath.  Has occasional episodes of lightheadedness with yawning? Or stretching, but potentially related to dehydration as he does not drink water throughout the day.  I have encouraged him to be sure he is staying well-hydrated and to notify me if he has any continued episodes.  I will change his previously scheduled appointment to 66-month follow-up on hypertension for closer monitoring given his history of kidney transplant and hypertension.

## 2019-07-05 ENCOUNTER — Telehealth: Payer: Self-pay

## 2019-07-05 NOTE — Telephone Encounter (Signed)
Received Pre op Risk assessment form from Dr. Cleda Mccreedy of [facility]. Dr. Burr Medico wants to see the patient and have lab work done.  Called patient at his listed number but was unable to leave a message as the mailbox was full.

## 2019-07-06 NOTE — Telephone Encounter (Signed)
Unable to leave message as mailbox is full

## 2019-07-09 ENCOUNTER — Telehealth: Payer: Self-pay

## 2019-07-09 ENCOUNTER — Ambulatory Visit: Payer: Self-pay | Admitting: Surgery

## 2019-07-09 DIAGNOSIS — K429 Umbilical hernia without obstruction or gangrene: Secondary | ICD-10-CM | POA: Diagnosis not present

## 2019-07-09 DIAGNOSIS — K4090 Unilateral inguinal hernia, without obstruction or gangrene, not specified as recurrent: Secondary | ICD-10-CM | POA: Diagnosis not present

## 2019-07-09 NOTE — H&P (Signed)
[name] Documented: 07/09/2019 2:13 PM Location: [facility] Patient #: [id number] DOB: 1955/09/12 Divorced / Language: Cleophus Molt / Race: Black or African American Male  History of Present Illness Marcello Moores A. Shamaine Mulkern MD; 07/09/2019 3:55 PM) Patient words: Patient presents for evaluation of umbilical hernia and left inguinal hernia. He was seen by his primary care physician as well as nephrologist. He is 6 years out from kidney transplantation and his kidney function has been excellent. He complains of pain in her bulges left groin for the last month. He also has pain around his umbilicus last month with a bulge. The bulge pops in and pops out and is become more symptomatic with burning sensation described in his left groin and around his umbilicus. No history of nausea vomiting or change in bowel or bladder function.  The patient is a 64 year old male.   Allergies (Chanel Teressa Senter, CMA; 07/09/2019 2:14 PM) No Known Allergies [07/09/2019]: Allergies Reconciled  Medication History (Chanel Teressa Senter, CMA; 07/09/2019 2:16 PM) Tamsulosin HCl (0.4MG  Capsule, Oral) Active. Prograf (1MG  Capsule, Oral) Active. Rosuvastatin Calcium (5MG  Tablet, Oral) Active. CellCept (250MG  Capsule, Oral) Active. Vyvanse (20MG  Capsule, Oral) Active. Ketotifen Fumarate (0.025% Solution, Ophthalmic) Active. Pyridoxine HCl (Oral) Specific strength unknown - Active. Cyanocobalamin (Oral) Specific strength unknown - Active. Medications Reconciled    Vitals (Chanel Nolan CMA; 07/09/2019 2:16 PM) 07/09/2019 2:16 PM Weight: 156 lb Height: 70in Body Surface Area: 1.88 m Body Mass Index: 22.38 kg/m  Temp.: 99.33F  Pulse: 107 (Regular)  BP: 130/72 (Sitting, Left Arm, Standard)        Physical Exam (Khyre Germond A. Edgel Degnan MD; 07/09/2019 3:57 PM)  General Mental Status-Alert. General Appearance-Consistent with stated age. Hydration-Well hydrated. Voice-Normal.  Chest and Lung  Exam Note: Breath sounds clear. No wheezing.  Cardiovascular Note: Normal sinus rhythm and JVD  Abdomen Note: Reducible left inguinal hernia noted. Reducible small umbilical hernia. Right lower abdominal scar secondary to renal transplant intact nontender  Musculoskeletal Normal Exam - Left-Upper Extremity Strength Normal and Lower Extremity Strength Normal. Normal Exam - Right-Upper Extremity Strength Normal and Lower Extremity Strength Normal.    Assessment & Plan (Alontae Chaloux A. Carrieanne Kleen MD; 0/06/7739 2:87 PM)  UMBILICAL HERNIA WITHOUT OBSTRUCTION AND WITHOUT GANGRENE (K42.9) Impression: Discuss repair with mesh. He is symptomatic and this would be the best option for him. Risks, benefits and other treatments discussed. Not a good laparoscopic candidate due to renal transplantation scarring. The risk of hernia repair include bleeding, infection, organ injury, bowel injury, bladder injury, nerve injury recurrent hernia, blood clots, worsening of underlying condition, chronic pain, mesh use, open surgery, death, and the need for other operattions. Pt agrees to proceed   LEFT INGUINAL HERNIA (K40.90) Impression: The risk of hernia repair include bleeding, infection, organ injury, bowel injury, bladder injury, nerve injury recurrent hernia, blood clots, worsening of underlying condition, chronic pain, mesh use, open surgery, death, and the need for other operattions. Pt agrees to proceed  Current Plans You are being scheduled for surgery- Our schedulers will call you.  You should hear from our office's scheduling department within 5 working days about the location, date, and time of surgery. We try to make accommodations for patient's preferences in scheduling surgery, but sometimes the OR schedule or the surgeon's schedule prevents [place] from making those accommodations.  If you have not heard from our office [phone]) in 5 working days, call the office and ask for your surgeon's  nurse.  If you have other questions about your diagnosis, plan,  or surgery, call the office and ask for your surgeon's nurse.  Pt Education - Pamphlet Given - Hernia Surgery: discussed with patient and provided information. Pt Education - CCS Mesh education: discussed with patient and provided information. CCS Consent - Hernia Repair - Ventral/Incisional/Umbilical (Gross): discussed with patient and provided information. The anatomy & physiology of the abdominal wall and pelvic floor was discussed. The pathophysiology of hernias in the inguinal and pelvic region was discussed. Natural history risks such as progressive enlargement, pain, incarceration, and strangulation was discussed. Contributors to complications such as smoking, obesity, diabetes, prior surgery, etc were discussed.  I feel the risks of no intervention will lead to serious problems that outweigh the operative risks; therefore, I recommended surgery to reduce and repair the hernia. I explained for an open approach. I noted usual use of mesh to patch and/or buttress hernia repair  Risks such as bleeding, infection, abscess, need for further treatment, heart attack, death, and other risks were discussed. I noted a good likelihood this will help address the problem. Goals of post-operative recovery were discussed as well. Possibility that this will not correct all symptoms was explained. I stressed the importance of low-impact activity, aggressive pain control, avoiding constipation, & not pushing through pain to minimize risk of post-operative chronic pain or injury. Possibility of reherniation was discussed. We will work to minimize complications.  An educational handout further explaining the pathology & treatment options was given as well. Questions were answered. The patient expresses understanding & wishes to proceed with surgery.

## 2019-07-09 NOTE — Telephone Encounter (Signed)
4th attempt to contact patient to discuss his need to see Dr. Burr Medico and have labs drawn bor Pre-operative Risk Assessment requested by Dr. Lorenz Coaster.  Patient did not answer and I was unable to leave a vm as his mailbox is full.

## 2019-07-10 ENCOUNTER — Ambulatory Visit: Payer: Medicare Other | Admitting: Physical Therapy

## 2019-07-10 ENCOUNTER — Telehealth: Payer: Self-pay | Admitting: Pharmacist

## 2019-07-10 NOTE — Telephone Encounter (Signed)
BP readings reviewed. BP treading down and near goal. Called pt to review missing BP readings. Pt stats that he has been doing well overall and has responded well to starting back on his antihypertensive medications. Relevant home meds include: metoprolol 25 mg BID. Pt previously had trouble getting his medications refilled and thus lead to episodes of poor adherence. Relayed that if he has any future trouble with getting his medications refilled, to call and notify me moving forward. Pt likes to go to the woods and fish as his form of exercise and plans on continuing this to help him manage his stress and health effectively. Pt would like [place] to confirm any potential concerns of dose increase with his nephrologist first. Hx of previous kidney transplant. No change recommended at this time. Pharmacist will continue to follow.

## 2019-07-12 ENCOUNTER — Ambulatory Visit: Payer: Medicare Other | Admitting: Physical Therapy

## 2019-07-17 ENCOUNTER — Ambulatory Visit: Payer: Medicare Other | Admitting: Physical Therapy

## 2019-07-19 ENCOUNTER — Ambulatory Visit: Payer: Medicare Other | Admitting: Physical Therapy

## 2019-08-01 DIAGNOSIS — I10 Essential (primary) hypertension: Secondary | ICD-10-CM | POA: Diagnosis not present

## 2019-08-02 HISTORY — PX: HERNIA REPAIR: SHX51

## 2019-08-09 ENCOUNTER — Encounter (HOSPITAL_BASED_OUTPATIENT_CLINIC_OR_DEPARTMENT_OTHER): Payer: Self-pay | Admitting: Surgery

## 2019-08-09 ENCOUNTER — Other Ambulatory Visit: Payer: Self-pay

## 2019-08-13 ENCOUNTER — Other Ambulatory Visit (HOSPITAL_COMMUNITY): Payer: Medicare Other

## 2019-08-15 ENCOUNTER — Other Ambulatory Visit (HOSPITAL_COMMUNITY)
Admission: RE | Admit: 2019-08-15 | Discharge: 2019-08-15 | Disposition: A | Discharge status: Home / Self Care | Payer: Medicare Other | Source: Ambulatory Visit | Attending: Surgery | Admitting: Surgery

## 2019-08-15 DIAGNOSIS — Z01812 Encounter for preprocedural laboratory examination: Secondary | ICD-10-CM | POA: Diagnosis not present

## 2019-08-15 DIAGNOSIS — Z20822 Contact with and (suspected) exposure to covid-19: Secondary | ICD-10-CM | POA: Diagnosis not present

## 2019-08-15 LAB — SARS CORONAVIRUS 2 (TAT 6-24 HRS): SARS Coronavirus 2: NEGATIVE

## 2019-08-15 NOTE — Progress Notes (Addendum)
No covid test completed, called pt and informed him that he could not have surgery if he does not get covid test today, pt verbalized understanding, notified Abigail Butts at [facility].

## 2019-08-16 ENCOUNTER — Ambulatory Visit (HOSPITAL_BASED_OUTPATIENT_CLINIC_OR_DEPARTMENT_OTHER)
Admission: RE | Admit: 2019-08-16 | Discharge: 2019-08-16 | Disposition: A | Discharge status: Home / Self Care | Payer: Medicare Other | Attending: Surgery | Admitting: Surgery

## 2019-08-16 ENCOUNTER — Encounter (HOSPITAL_COMMUNITY): Payer: Self-pay | Admitting: Certified Registered Nurse Anesthetist

## 2019-08-16 ENCOUNTER — Encounter (HOSPITAL_BASED_OUTPATIENT_CLINIC_OR_DEPARTMENT_OTHER)
Admission: RE | Disposition: A | Discharge status: Home / Self Care | Payer: Self-pay | Source: Home / Self Care | Attending: Surgery

## 2019-08-16 SURGERY — REPAIR, HERNIA, INGUINAL, ADULT
Anesthesia: General

## 2019-08-16 MED ORDER — CHLORHEXIDINE GLUCONATE CLOTH 2 % EX PADS: 6.0000 | MEDICATED_PAD | Freq: Once | CUTANEOUS | Status: DC

## 2019-08-16 MED ORDER — CEFAZOLIN SODIUM-DEXTROSE 2-4 GM/100ML-% IV SOLN: 2.0000 g | INTRAVENOUS | Status: DC

## 2019-08-16 MED ORDER — MIDAZOLAM HCL 2 MG/2ML IJ SOLN: 1.0000 mg | INTRAMUSCULAR | Status: DC | PRN

## 2019-08-16 MED ORDER — PROPOFOL 10 MG/ML IV BOLUS
INTRAVENOUS | Status: AC
  Filled 2019-08-16: qty 20

## 2019-08-16 MED ORDER — FENTANYL CITRATE (PF) 100 MCG/2ML IJ SOLN
INTRAMUSCULAR | Status: AC
  Filled 2019-08-16: qty 2

## 2019-08-16 MED ORDER — MIDAZOLAM HCL 2 MG/2ML IJ SOLN
INTRAMUSCULAR | Status: AC
  Filled 2019-08-16: qty 2

## 2019-08-16 MED ORDER — ACETAMINOPHEN 500 MG PO TABS: 1000.0000 mg | ORAL_TABLET | ORAL | Status: DC

## 2019-08-16 MED ORDER — LACTATED RINGERS IV SOLN: INTRAVENOUS | Status: DC

## 2019-08-16 MED ORDER — FENTANYL CITRATE (PF) 100 MCG/2ML IJ SOLN: 50.0000 ug | INTRAMUSCULAR | Status: DC | PRN

## 2019-08-16 NOTE — Progress Notes (Signed)
Pt arrived drinking from a mug. Said "it's water." Daughter removed lid and drink was coffee. Notified Dr Nyoka Cowden (anesthesia) who informed Dr Brantley Stage who said to cancel surgery for today. Spoke with patient and daughter, both understood. Daughter would like to be involved in future appointments and will work that out with Dr [employer] office for future visits.

## 2019-08-25 ENCOUNTER — Inpatient Hospital Stay (HOSPITAL_COMMUNITY): Admission: RE | Admit: 2019-08-25 | Payer: Medicare Other | Source: Ambulatory Visit

## 2019-08-25 ENCOUNTER — Emergency Department (HOSPITAL_COMMUNITY)
Admission: EM | Admit: 2019-08-25 | Discharge: 2019-08-25 | Discharge status: Absent without leave | Payer: Medicare Other

## 2019-08-25 ENCOUNTER — Other Ambulatory Visit: Payer: Self-pay

## 2019-08-27 ENCOUNTER — Other Ambulatory Visit: Payer: Self-pay

## 2019-08-27 ENCOUNTER — Encounter (HOSPITAL_BASED_OUTPATIENT_CLINIC_OR_DEPARTMENT_OTHER): Payer: Self-pay | Admitting: Surgery

## 2019-08-27 NOTE — Progress Notes (Signed)
Reviewed chart with Dr. Doroteo Glassman with anesthesia at [facility]. Okay to proceed with surgery but patient will need CBC and CMP prior to day of surgery.  Called and made [place] with Dr. Josetta Huddle office aware that we have not been able to reach patient after multiple attempts. Patient did not go for his COVID test on Saturday 08/25/19.

## 2019-08-27 NOTE — Progress Notes (Signed)
Patient finally returned pre op phone call. It was after 3:30pm so patient could not go get COVID test today(08/27/19) because [facility] is closed for the day. Instructed patient to get to [facility] testing site tomorrow 08/28/19 at 0800 when they open for his COVID test. Patient is aware that there is a possibility his test will not result in time for his surgery on 08/29/19 due to delay in COVID testing. Patient is also aware that he needs to come by the surgery center for pre op blood work on 08/29/19.

## 2019-08-28 ENCOUNTER — Other Ambulatory Visit (HOSPITAL_COMMUNITY)
Admission: RE | Admit: 2019-08-28 | Discharge: 2019-08-28 | Disposition: A | Discharge status: Home / Self Care | Payer: Medicare Other | Source: Ambulatory Visit | Attending: Surgery | Admitting: Surgery

## 2019-08-28 ENCOUNTER — Encounter (HOSPITAL_BASED_OUTPATIENT_CLINIC_OR_DEPARTMENT_OTHER)
Admission: RE | Admit: 2019-08-28 | Discharge: 2019-08-28 | Disposition: A | Discharge status: Home / Self Care | Payer: Medicare Other | Source: Ambulatory Visit | Attending: Surgery | Admitting: Surgery

## 2019-08-28 DIAGNOSIS — Z01812 Encounter for preprocedural laboratory examination: Secondary | ICD-10-CM | POA: Diagnosis not present

## 2019-08-28 DIAGNOSIS — Z20822 Contact with and (suspected) exposure to covid-19: Secondary | ICD-10-CM | POA: Insufficient documentation

## 2019-08-28 LAB — CBC WITH DIFFERENTIAL/PLATELET
Abs Immature Granulocytes: 0 10*3/uL (ref 0.00–0.07)
Basophils Absolute: 0.2 10*3/uL — ABNORMAL HIGH (ref 0.0–0.1)
Basophils Relative: 5 %
Eosinophils Absolute: 0.2 10*3/uL (ref 0.0–0.5)
Eosinophils Relative: 5 %
HCT: 29.9 % — ABNORMAL LOW (ref 39.0–52.0)
Hemoglobin: 8.5 g/dL — ABNORMAL LOW (ref 13.0–17.0)
Lymphocytes Relative: 34 %
Lymphs Abs: 1.4 10*3/uL (ref 0.7–4.0)
MCH: 18.4 pg — ABNORMAL LOW (ref 26.0–34.0)
MCHC: 28.4 g/dL — ABNORMAL LOW (ref 30.0–36.0)
MCV: 64.9 fL — ABNORMAL LOW (ref 80.0–100.0)
Monocytes Absolute: 0 10*3/uL — ABNORMAL LOW (ref 0.1–1.0)
Monocytes Relative: 1 %
Neutro Abs: 2.3 10*3/uL (ref 1.7–7.7)
Neutrophils Relative %: 55 %
Platelets: 257 10*3/uL (ref 150–400)
RBC: 4.61 MIL/uL (ref 4.22–5.81)
RDW: 21.2 % — ABNORMAL HIGH (ref 11.5–15.5)
WBC: 4.1 10*3/uL (ref 4.0–10.5)
nRBC: 0 % (ref 0.0–0.2)
nRBC: 1 /100 WBC — ABNORMAL HIGH

## 2019-08-28 LAB — COMPREHENSIVE METABOLIC PANEL
ALT: 12 U/L (ref 0–44)
AST: 21 U/L (ref 15–41)
Albumin: 3.7 g/dL (ref 3.5–5.0)
Alkaline Phosphatase: 53 U/L (ref 38–126)
Anion gap: 9 (ref 5–15)
BUN: 11 mg/dL (ref 8–23)
CO2: 25 mmol/L (ref 22–32)
Calcium: 9.6 mg/dL (ref 8.9–10.3)
Chloride: 105 mmol/L (ref 98–111)
Creatinine, Ser: 1.31 mg/dL — ABNORMAL HIGH (ref 0.61–1.24)
GFR calc Af Amer: >60 mL/min (ref >60)
GFR calc non Af Amer: 58 mL/min — ABNORMAL LOW (ref >60)
Glucose, Bld: 89 mg/dL (ref 70–99)
Potassium: 4.3 mmol/L (ref 3.5–5.1)
Sodium: 139 mmol/L (ref 135–145)
Total Bilirubin: 0.5 mg/dL (ref 0.3–1.2)
Total Protein: 7.1 g/dL (ref 6.5–8.1)

## 2019-08-28 LAB — SARS CORONAVIRUS 2 (TAT 6-24 HRS): SARS Coronavirus 2: NEGATIVE

## 2019-08-28 NOTE — Progress Notes (Signed)

## 2019-08-28 NOTE — Progress Notes (Signed)
Labs reviewed with Dr. Eligha Bridegroom.  Okay to proceed with surgery tomorrow.

## 2019-08-29 ENCOUNTER — Encounter (HOSPITAL_BASED_OUTPATIENT_CLINIC_OR_DEPARTMENT_OTHER): Payer: Self-pay | Admitting: Surgery

## 2019-08-29 ENCOUNTER — Other Ambulatory Visit: Payer: Self-pay

## 2019-08-29 ENCOUNTER — Encounter (HOSPITAL_BASED_OUTPATIENT_CLINIC_OR_DEPARTMENT_OTHER)
Admission: RE | Disposition: A | Discharge status: Home / Self Care | Payer: Self-pay | Source: Home / Self Care | Attending: Surgery

## 2019-08-29 ENCOUNTER — Ambulatory Visit (HOSPITAL_BASED_OUTPATIENT_CLINIC_OR_DEPARTMENT_OTHER)
Admission: RE | Admit: 2019-08-29 | Discharge: 2019-08-29 | Disposition: A | Discharge status: Home / Self Care | Payer: Medicare Other | Attending: Surgery | Admitting: Surgery

## 2019-08-29 ENCOUNTER — Ambulatory Visit (HOSPITAL_BASED_OUTPATIENT_CLINIC_OR_DEPARTMENT_OTHER): Payer: Medicare Other | Admitting: Certified Registered"

## 2019-08-29 DIAGNOSIS — I739 Peripheral vascular disease, unspecified: Secondary | ICD-10-CM | POA: Insufficient documentation

## 2019-08-29 DIAGNOSIS — Z940 Kidney transplant status: Secondary | ICD-10-CM | POA: Insufficient documentation

## 2019-08-29 DIAGNOSIS — Z87891 Personal history of nicotine dependence: Secondary | ICD-10-CM | POA: Insufficient documentation

## 2019-08-29 DIAGNOSIS — K4090 Unilateral inguinal hernia, without obstruction or gangrene, not specified as recurrent: Secondary | ICD-10-CM | POA: Diagnosis not present

## 2019-08-29 DIAGNOSIS — K219 Gastro-esophageal reflux disease without esophagitis: Secondary | ICD-10-CM | POA: Diagnosis not present

## 2019-08-29 DIAGNOSIS — G8918 Other acute postprocedural pain: Secondary | ICD-10-CM | POA: Diagnosis not present

## 2019-08-29 DIAGNOSIS — I10 Essential (primary) hypertension: Secondary | ICD-10-CM | POA: Diagnosis not present

## 2019-08-29 DIAGNOSIS — Z79899 Other long term (current) drug therapy: Secondary | ICD-10-CM | POA: Insufficient documentation

## 2019-08-29 DIAGNOSIS — K429 Umbilical hernia without obstruction or gangrene: Secondary | ICD-10-CM | POA: Insufficient documentation

## 2019-08-29 HISTORY — PX: INGUINAL HERNIA REPAIR: SHX194

## 2019-08-29 HISTORY — PX: UMBILICAL HERNIA REPAIR: SHX196

## 2019-08-29 SURGERY — REPAIR, HERNIA, INGUINAL, ADULT
Anesthesia: General | Site: Groin

## 2019-08-29 MED ORDER — EPHEDRINE 5 MG/ML INJ
INTRAVENOUS | Status: AC
  Filled 2019-08-29: qty 10

## 2019-08-29 MED ORDER — ROCURONIUM BROMIDE 100 MG/10ML IV SOLN
INTRAVENOUS | Status: DC | PRN
  Administered 2019-08-29: 60 mg via INTRAVENOUS

## 2019-08-29 MED ORDER — CEFAZOLIN SODIUM-DEXTROSE 2-4 GM/100ML-% IV SOLN
INTRAVENOUS | Status: AC
  Filled 2019-08-29: qty 100

## 2019-08-29 MED ORDER — PROMETHAZINE HCL 25 MG/ML IJ SOLN: 6.2500 mg | INTRAMUSCULAR | Status: DC | PRN

## 2019-08-29 MED ORDER — OXYCODONE HCL 5 MG/5ML PO SOLN: 5.0000 mg | Freq: Once | ORAL | Status: DC | PRN

## 2019-08-29 MED ORDER — 0.9 % SODIUM CHLORIDE (POUR BTL) OPTIME
TOPICAL | Status: DC | PRN
  Administered 2019-08-29: 15:00:00 500 mL

## 2019-08-29 MED ORDER — FENTANYL CITRATE (PF) 100 MCG/2ML IJ SOLN
INTRAMUSCULAR | Status: DC | PRN
  Administered 2019-08-29: 50 ug via INTRAVENOUS

## 2019-08-29 MED ORDER — ONDANSETRON HCL 4 MG/2ML IJ SOLN
INTRAMUSCULAR | Status: AC
  Filled 2019-08-29: qty 10

## 2019-08-29 MED ORDER — FENTANYL CITRATE (PF) 100 MCG/2ML IJ SOLN
INTRAMUSCULAR | Status: AC
  Filled 2019-08-29: qty 2

## 2019-08-29 MED ORDER — FENTANYL CITRATE (PF) 100 MCG/2ML IJ SOLN
50.0000 ug | INTRAMUSCULAR | Status: DC | PRN
  Administered 2019-08-29: 100 ug via INTRAVENOUS

## 2019-08-29 MED ORDER — MIDAZOLAM HCL 2 MG/2ML IJ SOLN
INTRAMUSCULAR | Status: AC
  Filled 2019-08-29: qty 2

## 2019-08-29 MED ORDER — PROPOFOL 10 MG/ML IV BOLUS
INTRAVENOUS | Status: DC | PRN
  Administered 2019-08-29: 100 mg via INTRAVENOUS

## 2019-08-29 MED ORDER — BUPIVACAINE HCL (PF) 0.25 % IJ SOLN
INTRAMUSCULAR | Status: DC | PRN
  Administered 2019-08-29: 20 mL

## 2019-08-29 MED ORDER — DEXAMETHASONE SODIUM PHOSPHATE 4 MG/ML IJ SOLN
INTRAMUSCULAR | Status: DC | PRN
  Administered 2019-08-29: 4 mg via INTRAVENOUS

## 2019-08-29 MED ORDER — MIDAZOLAM HCL 2 MG/2ML IJ SOLN
1.0000 mg | INTRAMUSCULAR | Status: DC | PRN
  Administered 2019-08-29: 13:00:00 2 mg via INTRAVENOUS

## 2019-08-29 MED ORDER — LACTATED RINGERS IV SOLN: INTRAVENOUS | Status: DC

## 2019-08-29 MED ORDER — ROPIVACAINE HCL 5 MG/ML IJ SOLN
INTRAMUSCULAR | Status: DC | PRN
  Administered 2019-08-29: 30 mL via PERINEURAL

## 2019-08-29 MED ORDER — OXYCODONE HCL 5 MG PO TABS
5.0000 mg | ORAL_TABLET | Freq: Four times a day (QID) | ORAL | 0 refills | Status: DC | PRN
Start: 2019-08-29 — End: 2019-10-03

## 2019-08-29 MED ORDER — CHLORHEXIDINE GLUCONATE CLOTH 2 % EX PADS: 6.0000 | MEDICATED_PAD | Freq: Once | CUTANEOUS | Status: DC

## 2019-08-29 MED ORDER — CEFAZOLIN SODIUM-DEXTROSE 2-4 GM/100ML-% IV SOLN
2.0000 g | INTRAVENOUS | Status: AC
  Administered 2019-08-29: 2 g via INTRAVENOUS

## 2019-08-29 MED ORDER — SUGAMMADEX SODIUM 200 MG/2ML IV SOLN
INTRAVENOUS | Status: DC | PRN
  Administered 2019-08-29: 200 mg via INTRAVENOUS

## 2019-08-29 MED ORDER — SUGAMMADEX SODIUM 500 MG/5ML IV SOLN
INTRAVENOUS | Status: AC
  Filled 2019-08-29: qty 10

## 2019-08-29 MED ORDER — ONDANSETRON HCL 4 MG/2ML IJ SOLN
INTRAMUSCULAR | Status: DC | PRN
  Administered 2019-08-29: 4 mg via INTRAVENOUS

## 2019-08-29 MED ORDER — DEXAMETHASONE SODIUM PHOSPHATE 10 MG/ML IJ SOLN
INTRAMUSCULAR | Status: AC
  Filled 2019-08-29: qty 2

## 2019-08-29 MED ORDER — OXYCODONE HCL 5 MG PO TABS: 5.0000 mg | ORAL_TABLET | Freq: Once | ORAL | Status: DC | PRN

## 2019-08-29 MED ORDER — HYDROMORPHONE HCL 1 MG/ML IJ SOLN: 0.2500 mg | INTRAMUSCULAR | Status: DC | PRN

## 2019-08-29 MED ORDER — ROCURONIUM BROMIDE 10 MG/ML (PF) SYRINGE
PREFILLED_SYRINGE | INTRAVENOUS | Status: AC
  Filled 2019-08-29: qty 10

## 2019-08-29 SURGICAL SUPPLY — 63 items
ADH SKN CLS APL DERMABOND .7 (GAUZE/BANDAGES/DRESSINGS) ×2
APL PRP STRL LF DISP 70% ISPRP (MISCELLANEOUS) ×2
APL SKNCLS STERI-STRIP NONHPOA (GAUZE/BANDAGES/DRESSINGS)
BENZOIN TINCTURE PRP APPL 2/3 (GAUZE/BANDAGES/DRESSINGS) IMPLANT
BLADE CLIPPER SURG (BLADE) ×2 IMPLANT
BLADE SURG 10 STRL SS (BLADE) IMPLANT
BLADE SURG 15 STRL LF DISP TIS (BLADE) ×2 IMPLANT
BLADE SURG 15 STRL SS (BLADE) ×4
CANISTER SUCT 1200ML W/VALVE (MISCELLANEOUS) ×2 IMPLANT
CHLORAPREP W/TINT 26 (MISCELLANEOUS) ×4 IMPLANT
CLOSURE WOUND 1/2 X4 (GAUZE/BANDAGES/DRESSINGS)
COVER BACK TABLE 60X90IN (DRAPES) ×4 IMPLANT
COVER MAYO STAND STRL (DRAPES) ×4 IMPLANT
COVER WAND RF STERILE (DRAPES) IMPLANT
DECANTER SPIKE VIAL GLASS SM (MISCELLANEOUS) IMPLANT
DERMABOND ADVANCED (GAUZE/BANDAGES/DRESSINGS) ×2
DERMABOND ADVANCED .7 DNX12 (GAUZE/BANDAGES/DRESSINGS) ×2 IMPLANT
DRAIN PENROSE 1/2X12 LTX STRL (WOUND CARE) ×4 IMPLANT
DRAPE LAPAROTOMY TRNSV 102X78 (DRAPES) ×4 IMPLANT
DRAPE UTILITY XL STRL (DRAPES) ×4 IMPLANT
DRSG TEGADERM 4X4.75 (GAUZE/BANDAGES/DRESSINGS) IMPLANT
ELECT COATED BLADE 2.86 ST (ELECTRODE) ×4 IMPLANT
ELECT REM PT RETURN 9FT ADLT (ELECTROSURGICAL) ×4
ELECTRODE REM PT RTRN 9FT ADLT (ELECTROSURGICAL) ×2 IMPLANT
GAUZE 4X4 16PLY RFD (DISPOSABLE) ×2 IMPLANT
GAUZE SPONGE 4X4 12PLY STRL LF (GAUZE/BANDAGES/DRESSINGS) IMPLANT
GLOVE BIOGEL PI IND STRL 6.5 (GLOVE) IMPLANT
GLOVE BIOGEL PI IND STRL 7.0 (GLOVE) IMPLANT
GLOVE BIOGEL PI IND STRL 8 (GLOVE) ×2 IMPLANT
GLOVE BIOGEL PI INDICATOR 6.5 (GLOVE) ×4
GLOVE BIOGEL PI INDICATOR 7.0 (GLOVE) ×2
GLOVE BIOGEL PI INDICATOR 8 (GLOVE) ×2
GLOVE ECLIPSE 6.5 STRL STRAW (GLOVE) ×4 IMPLANT
GLOVE ECLIPSE 8.0 STRL XLNG CF (GLOVE) ×6 IMPLANT
GOWN STRL REUS W/ TWL LRG LVL3 (GOWN DISPOSABLE) ×4 IMPLANT
GOWN STRL REUS W/TWL LRG LVL3 (GOWN DISPOSABLE) ×12
MESH PARIETEX PROGRIP LEFT (Mesh General) ×2 IMPLANT
NDL HYPO 25X1 1.5 SAFETY (NEEDLE) ×2 IMPLANT
NEEDLE HYPO 22GX1.5 SAFETY (NEEDLE) IMPLANT
NEEDLE HYPO 25X1 1.5 SAFETY (NEEDLE) ×4 IMPLANT
NS IRRIG 1000ML POUR BTL (IV SOLUTION) ×2 IMPLANT
PENCIL SMOKE EVACUATOR (MISCELLANEOUS) ×4 IMPLANT
SET BASIN DAY SURGERY F.S. (CUSTOM PROCEDURE TRAY) ×4 IMPLANT
SLEEVE SCD COMPRESS KNEE MED (MISCELLANEOUS) ×4 IMPLANT
SPONGE LAP 4X18 RFD (DISPOSABLE) ×2 IMPLANT
STAPLER VISISTAT 35W (STAPLE) IMPLANT
STRIP CLOSURE SKIN 1/2X4 (GAUZE/BANDAGES/DRESSINGS) IMPLANT
SUT MON AB 4-0 PC3 18 (SUTURE) ×6 IMPLANT
SUT NOVA 0 T19/GS 22DT (SUTURE) IMPLANT
SUT NOVA NAB DX-16 0-1 5-0 T12 (SUTURE) ×8 IMPLANT
SUT SILK 3 0 SH 30 (SUTURE) IMPLANT
SUT VIC AB 2-0 SH 27 (SUTURE) ×4
SUT VIC AB 2-0 SH 27XBRD (SUTURE) ×2 IMPLANT
SUT VIC AB 3-0 54X BRD REEL (SUTURE) IMPLANT
SUT VIC AB 3-0 BRD 54 (SUTURE)
SUT VICRYL 3-0 CR8 SH (SUTURE) ×4 IMPLANT
SUT VICRYL AB 2 0 TIE (SUTURE) IMPLANT
SUT VICRYL AB 2 0 TIES (SUTURE)
SYR CONTROL 10ML LL (SYRINGE) ×4 IMPLANT
TOWEL GREEN STERILE FF (TOWEL DISPOSABLE) ×4 IMPLANT
TUBE CONNECTING 20'X1/4 (TUBING) ×1
TUBE CONNECTING 20X1/4 (TUBING) ×1 IMPLANT
YANKAUER SUCT BULB TIP NO VENT (SUCTIONS) ×2 IMPLANT

## 2019-08-29 NOTE — Interval H&P Note (Signed)
History and Physical Interval Note:  08/29/2019 1:58 PM  Scott Chavez  has presented today for surgery, with the diagnosis of LEFT INGUINAL HERNIA/ UMBILICAL HERNIA.  The various methods of treatment have been discussed with the patient and family. After consideration of risks, benefits and other options for treatment, the patient has consented to  Procedure(s): LEFT INGUINAL HERNIA REPAIR WITH MESH (Left) UMBILICAL HERNIA REPAIR WITH MESH (N/A) as a surgical intervention.  The patient's history has been reviewed, patient examined, no change in status, stable for surgery.  I have reviewed the patient's chart and labs.  Questions were answered to the patient's satisfaction.     [place]

## 2019-08-29 NOTE — Progress Notes (Signed)
Assisted Dr. Sabra Heck with left ultrasound guided tap block. Side rails up, monitors on throughout procedure. See vital signs in flow sheet. Tolerated Procedure well.

## 2019-08-29 NOTE — Anesthesia Preprocedure Evaluation (Signed)
Anesthesia Evaluation  Patient identified by MRN, date of birth, ID band Patient awake    Reviewed: Allergy & Precautions, NPO status , Patient's Chart, lab work & pertinent test results  History of Anesthesia Complications Negative for: history of anesthetic complications  Airway Mallampati: II  TM Distance: >3 FB Neck ROM: Full    Dental no notable dental hx.    Pulmonary neg pulmonary ROS, Patient abstained from smoking., former smoker,    Pulmonary exam normal breath sounds clear to auscultation       Cardiovascular hypertension, Pt. on home beta blockers and Pt. on medications + Peripheral Vascular Disease  Normal cardiovascular exam Rhythm:Regular Rate:Normal     Neuro/Psych negative neurological ROS  negative psych ROS   GI/Hepatic GERD  ,(+) Hepatitis -, CGI bleed   Endo/Other  negative endocrine ROS  Renal/GU Renal disease (s/p renal txp, no longer on dialysis)  negative genitourinary   Musculoskeletal negative musculoskeletal ROS (+)   Abdominal   Peds  Hematology  (+) anemia ,   Anesthesia Other Findings   Reproductive/Obstetrics                             Anesthesia Physical  Anesthesia Plan  ASA: III  Anesthesia Plan: General   Post-op Pain Management:  Regional for Post-op pain   Induction: Intravenous  PONV Risk Score and Plan: 2 and Treatment may vary due to age or medical condition, Ondansetron and Midazolam  Airway Management Planned: Oral ETT  Additional Equipment: None  Intra-op Plan:   Post-operative Plan: Extubation in OR  Informed Consent: I have reviewed the patients History and Physical, chart, labs and discussed the procedure including the risks, benefits and alternatives for the proposed anesthesia with the patient or authorized representative who has indicated his/her understanding and acceptance.       Plan Discussed with:   Anesthesia  Plan Comments:         Anesthesia Quick Evaluation

## 2019-08-29 NOTE — Discharge Instructions (Signed)
[facility] _______[facility] ° °UMBILICAL OR INGUINAL HERNIA REPAIR: POST OP INSTRUCTIONS ° °Always review your discharge instruction sheet given to you by the facility where your surgery was performed. °IF YOU HAVE DISABILITY OR FAMILY LEAVE FORMS, YOU MUST BRING THEM TO THE OFFICE FOR PROCESSING.   °DO NOT GIVE THEM TO YOUR DOCTOR. ° °1. A  prescription for pain medication may be given to you upon discharge.  Take your pain medication as prescribed, if needed.  If narcotic pain medicine is not needed, then you may take acetaminophen (Tylenol) or ibuprofen (Advil) as needed. °2. Take your usually prescribed medications unless otherwise directed. °If you need a refill on your pain medication, please contact your pharmacy.  They will contact our office to request authorization. Prescriptions will not be filled after 5 pm or on week-ends. °3. You should follow a light diet the first 24 hours after arrival home, such as soup and crackers, etc.  Be sure to include lots of fluids daily.  Resume your normal diet the day after surgery. °4.Most patients will experience some swelling and bruising around the umbilicus or in the groin and scrotum.  Ice packs and reclining will help.  Swelling and bruising can take several days to resolve.  °6. It is common to experience some constipation if taking pain medication after surgery.  Increasing fluid intake and taking a stool softener (such as Colace) will usually help or prevent this problem from occurring.  A mild laxative (Milk of Magnesia or Miralax) should be taken according to package directions if there are no bowel movements after 48 hours. °7. Unless discharge instructions indicate otherwise, you may remove your bandages 24-48 hours after surgery, and you may shower at that time.  You may have steri-strips (small skin tapes) in place directly over the incision.  These strips should be left on the skin for 7-10 days.  If your surgeon used skin glue on the  incision, you may shower in 24 hours.  The glue will flake off over the next 2-3 weeks.  Any sutures or staples will be removed at the office during your follow-up visit. °8. ACTIVITIES:  You may resume regular (light) daily activities beginning the next day--such as daily self-care, walking, climbing stairs--gradually increasing activities as tolerated.  You may have sexual intercourse when it is comfortable.  Refrain from any heavy lifting or straining until approved by your doctor. ° °a.You may drive when you are no longer taking prescription pain medication, you can comfortably wear a seatbelt, and you can safely maneuver your car and apply brakes. °b.RETURN TO WORK:   °_____________________________________________ ° °9.You should see your doctor in the office for a follow-up appointment approximately 2-3 weeks after your surgery.  Make sure that you call for this appointment within a day or two after you arrive home to insure a convenient appointment time. °10.OTHER INSTRUCTIONS: _________________________ °   _____________________________________ ° °WHEN TO CALL YOUR DOCTOR: °1. Fever over 101.0 °2. Inability to urinate °3. Nausea and/or vomiting °4. Extreme swelling or bruising °5. Continued bleeding from incision. °6. Increased pain, redness, or drainage from the incision ° °The clinic staff is available to answer your questions during regular business hours.  Please don’t hesitate to call and ask to speak to one of the nurses for clinical concerns.  If you have a medical emergency, go to the nearest emergency room or call 911.  A surgeon from [facility] is always on call at the hospital ° ° °  [address], [address], [place], [place]  [address] ? ° [address] [address], [place], [place]   [address] °[phone] ? [phone] ? FAX [phone] °Web site: [email or web address] ° ° °Post Anesthesia Home Care Instructions ° °Activity: °Get plenty of rest for the remainder of the day. A  responsible individual must stay with you for 24 hours following the procedure.  °For the next 24 hours, DO NOT: °-Drive a car °-Operate machinery °-Drink alcoholic beverages °-Take any medication unless instructed by your physician °-Make any legal decisions or sign important papers. ° °Meals: °Start with liquid foods such as gelatin or soup. Progress to regular foods as tolerated. Avoid greasy, spicy, heavy foods. If nausea and/or vomiting occur, drink only clear liquids until the nausea and/or vomiting subsides. Call your physician if vomiting continues. ° °Special Instructions/Symptoms: °Your throat may feel dry or sore from the anesthesia or the breathing tube placed in your throat during surgery. If this causes discomfort, gargle with warm salt water. The discomfort should disappear within 24 hours. ° °If you had a scopolamine patch placed behind your ear for the management of post- operative nausea and/or vomiting: ° °1. The medication in the patch is effective for 72 hours, after which it should be removed.  Wrap patch in a tissue and discard in the trash. Wash hands thoroughly with soap and water. °2. You may remove the patch earlier than 72 hours if you experience unpleasant side effects which may include dry mouth, dizziness or visual disturbances. °3. Avoid touching the patch. Wash your hands with soap and water after contact with the patch. °  ° °

## 2019-08-29 NOTE — Anesthesia Procedure Notes (Signed)
Anesthesia Regional Block: TAP block   Pre-Anesthetic Checklist: ,, timeout performed, Correct Patient, Correct Site, Correct Laterality, Correct Procedure, Correct Position, site marked, Risks and benefits discussed,  Surgical consent,  Pre-op evaluation,  At surgeon's request and post-op pain management  Laterality: Left  Prep: chloraprep       Needles:  Injection technique: Single-shot  Needle Type: Stimiplex     Needle Length: 9cm  Needle Gauge: 21     Additional Needles:   Procedures:,,,, ultrasound used (permanent image in chart),,,,  Narrative:  Start time: 08/29/2019 12:57 PM End time: 08/29/2019 1:02 PM Injection made incrementally with aspirations every 5 mL.  Performed by: Personally  Anesthesiologist: Lynda Rainwater, MD

## 2019-08-29 NOTE — Anesthesia Procedure Notes (Signed)
Procedure Name: Intubation Date/Time: 08/29/2019 2:08 PM Performed by: Signe Colt, CRNA Pre-anesthesia Checklist: Patient identified, Emergency Drugs available, Suction available and Patient being monitored Patient Re-evaluated:Patient Re-evaluated prior to induction Oxygen Delivery Method: Circle system utilized Preoxygenation: Pre-oxygenation with 100% oxygen Induction Type: IV induction Ventilation: Mask ventilation without difficulty Laryngoscope Size: Mac and 3 Grade View: Grade I Tube type: Oral Tube size: 7.0 mm Number of attempts: 1 Airway Equipment and Method: Stylet and Oral airway Placement Confirmation: ETT inserted through vocal cords under direct vision,  positive ETCO2 and breath sounds checked- equal and bilateral Secured at: 21 cm Tube secured with: Tape Dental Injury: Teeth and Oropharynx as per pre-operative assessment

## 2019-08-29 NOTE — H&P (Signed)
[name]  Location: [facility] Patient #: [id number] DOB: 1955-05-16 Divorced / Language: Scott Chavez / Race: Black or African American Male  History of Present Illness Patient words: Patient presents for evaluation of umbilical hernia and left inguinal hernia. He was seen by his primary care physician as well as nephrologist. He is 6 years out from kidney transplantation and his kidney function has been excellent. He complains of pain in her bulges left groin for the last month. He also has pain around his umbilicus last month with a bulge. The bulge pops in and pops out and is become more symptomatic with burning sensation described in his left groin and around his umbilicus. No history of nausea vomiting or change in bowel or bladder function.  The patient is a 64 year old male.   Allergies  No Known Allergies Allergies Reconciled  Medication History Tamsulosin HCl (0.4MG  Capsule, Oral) Active. Prograf (1MG  Capsule, Oral) Active. Rosuvastatin Calcium (5MG  Tablet, Oral) Active. CellCept (250MG  Capsule, Oral) Active. Vyvanse (20MG  Capsule, Oral) Active. Ketotifen Fumarate (0.025% Solution, Ophthalmic) Active. Pyridoxine HCl (Oral) Specific strength unknown - Active. Cyanocobalamin (Oral) Specific strength unknown - Active. Medications Reconciled    Vitals  07/09/2019 2:16 PM Weight: 156 lb Height: 70in Body Surface Area: 1.88 m Body Mass Index: 22.38 kg/m  Temp.: 99.54F  Pulse: 107 (Regular)  BP: 130/72 (Sitting, Left Arm, Standard)        Physical Exam  General Mental Status-Alert. General Appearance-Consistent with stated age. Hydration-Well hydrated. Voice-Normal.  Chest and Lung Exam Note: Breath sounds clear. No wheezing.  Cardiovascular Note: Normal sinus rhythm and JVD  Abdomen Note: Reducible left inguinal hernia noted. Reducible small umbilical hernia. Right lower abdominal scar  secondary to renal transplant intact nontender  Musculoskeletal Normal Exam - Left-Upper Extremity Strength Normal and Lower Extremity Strength Normal. Normal Exam - Right-Upper Extremity Strength Normal and Lower Extremity Strength Normal.    Assessment & Plan   UMBILICAL HERNIA WITHOUT OBSTRUCTION AND WITHOUT GANGRENE (K42.9) Impression: Discuss repair with mesh. He is symptomatic and this would be the best option for him. Risks, benefits and other treatments discussed. Not a good laparoscopic candidate due to renal transplantation scarring. The risk of hernia repair include bleeding, infection, organ injury, bowel injury, bladder injury, nerve injury recurrent hernia, blood clots, worsening of underlying condition, chronic pain, mesh use, open surgery, death, and the need for other operattions. Pt agrees to proceed   LEFT INGUINAL HERNIA (K40.90) Impression: The risk of hernia repair include bleeding, infection, organ injury, bowel injury, bladder injury, nerve injury recurrent hernia, blood clots, worsening of underlying condition, chronic pain, mesh use, open surgery, death, and the need for other operattions. Pt agrees to proceed  Current Plans You are being scheduled for surgery- Our schedulers will call you.  You should hear from our office's scheduling department within 5 working days about the location, date, and time of surgery. We try to make accommodations for patient's preferences in scheduling surgery, but sometimes the OR schedule or the surgeon's schedule prevents [place] from making those accommodations.  If you have not heard from our office [phone]) in 5 working days, call the office and ask for your surgeon's nurse.  If you have other questions about your diagnosis, plan, or surgery, call the office and ask for your surgeon's nurse.  Pt Education - Pamphlet Given - Hernia Surgery: discussed with patient and provided information. Pt Education - [facility]  Mesh education: discussed with patient and provided information. [facility] Consent -  Hernia Repair - Ventral/Incisional/Umbilical (Gross): discussed with patient and provided information. The anatomy & physiology of the abdominal wall and pelvic floor was discussed. The pathophysiology of hernias in the inguinal and pelvic region was discussed. Natural history risks such as progressive enlargement, pain, incarceration, and strangulation was discussed. Contributors to complications such as smoking, obesity, diabetes, prior surgery, etc were discussed.  I feel the risks of no intervention will lead to serious problems that outweigh the operative risks; therefore, I recommended surgery to reduce and repair the hernia. I explained for an open approach. I noted usual use of mesh to patch and/or buttress hernia repair  Risks such as bleeding, infection, abscess, need for further treatment, heart attack, death, and other risks were discussed. I noted a good likelihood this will help address the problem. Goals of post-operative recovery were discussed as well. Possibility that this will not correct all symptoms was explained. I stressed the importance of low-impact activity, aggressive pain control, avoiding constipation, & not pushing through pain to minimize risk of post-operative chronic pain or injury. Possibility of reherniation was discussed. We will work to minimize complications.  An educational handout further explaining the pathology & treatment options was given as well. Questions were answered. The patient expresses understanding & wishes to proceed with surgery.

## 2019-08-29 NOTE — Anesthesia Postprocedure Evaluation (Signed)
Anesthesia Post Note  Patient: TOREN TUCHOLSKI  Procedure(s) Performed: LEFT INGUINAL HERNIA REPAIR WITH MESH (Left Groin) UMBILICAL HERNIA REPAIR WITH MESH (N/A Abdomen)     Patient location during evaluation: PACU Anesthesia Type: General Level of consciousness: awake and alert Pain management: pain level controlled Vital Signs Assessment: post-procedure vital signs reviewed and stable Respiratory status: spontaneous breathing, nonlabored ventilation and respiratory function stable Cardiovascular status: blood pressure returned to baseline and stable Postop Assessment: no apparent nausea or vomiting Anesthetic complications: no    Last Vitals:  Vitals:   08/29/19 1620 08/29/19 1645  BP:  (!) 149/83  Pulse: 66 63  Resp: 15 16  Temp:  (!) 36.2 C  SpO2: 100% 97%    Last Pain:  Vitals:   08/29/19 1645  TempSrc:   PainSc: 0-No pain                 Lynda Rainwater

## 2019-08-29 NOTE — Transfer of Care (Signed)
Immediate Anesthesia Transfer of Care Note  Patient: Scott Chavez  Procedure(s) Performed: LEFT INGUINAL HERNIA REPAIR WITH MESH (Left Groin) UMBILICAL HERNIA REPAIR WITH MESH (N/A Abdomen)  Patient Location: PACU  Anesthesia Type:GA combined with regional for post-op pain  Level of Consciousness: awake, alert , oriented and patient cooperative  Airway & Oxygen Therapy: Patient Spontanous Breathing and Patient connected to face mask oxygen  Post-op Assessment: Report given to RN and Post -op Vital signs reviewed and stable  Post vital signs: Reviewed and stable  Last Vitals:  Vitals Value Taken Time  BP 135/67 08/29/19 1548  Temp    Pulse 73 08/29/19 1551  Resp 25 08/29/19 1551  SpO2 100 % 08/29/19 1551  Vitals shown include unvalidated device data.  Last Pain:  Vitals:   08/29/19 1254  TempSrc:   PainSc: 0-No pain      Patients Stated Pain Goal: 4 (12/75/17 0017)  Complications: No apparent anesthesia complications

## 2019-08-29 NOTE — Op Note (Signed)
Preoperative diagnosis: Left inguinal hernia and umbilical hernia both reducible nonincarcerated  Postop diagnosis: Same  Procedure: Repair of left inguinal hernia with progrip mesh and repair of umbilical hernia  Surgeon: Erroll Luna, MD  Anesthesia: General with local and left TA, P block per anesthesia  EBL: Minimal  Specimen: None  IV fluids: Per anesthesia record  Indications for procedure: The patient presents for repair of left inguinal hernia and umbilical hernia.  Risk, benefits and the treatment options were discussed.The risk of hernia repair include bleeding,  Infection,   Recurrence of the hernia,  Mesh use, chronic pain,  Organ injury,  Bowel injury,  Bladder injury,   nerve injury with numbness around the incision,  Death,  and worsening of preexisting  medical problems.  The alternatives to surgery have been discussed as well..  Long term expectations of both operative and non operative treatments have been discussed.   The patient agrees to proceed.   Discussed complications of bleeding, infection, mesh use, the reason for mesh diseases recurrent to reduce recurrence, mesh safety.  Laparoscopic and open techniques discussed.  He had a previous kidney transplant therefore I did not feel laparoscopic approach appropriate for him.  Description of procedure: The patient was met in the holding area and questions were answered.  Left side was marked as correct as well as the umbilicus.  He underwent a left block per anesthesia.  He was taken back to the operating.  He is placed upon upon the OR table.  After induction of general anesthesia, left groin and periumbilical region were prepped and draped in sterile fashion timeout performed.  He received appropriate preoperative antibiotics.  Patient site, procedure verified.  Left inguinal incision made.  Dissection carried down through the subcu tissues until the aponeurosis of the external oblique was identified.  It was opened in  the direction of the fibers.  The cord structures were encircled with 1/2 inch Penrose drain.  There is no indirect defect.  He did have a moderate size direct defect.  ProGrip mesh was used.  He was laid on the floor the canal in the cord was allowed to exit through the mesh which was secured to itself.  This was secured to the fascia of the shelving edge of the inguinal ligament with #2 Novafil.  2-0 Novafil used to secure it to the pubic tubercle and conjoined tendon.  The iliohypogastric and ileal nerves were tethered to the mesh and these were ligated as it exited the muscle to prevent postoperative neuralgia.  The patient's history of chronic drug use and concern was for potential entrapment leading to chronic pain and subsequent narcotic abuse.  The mesh laid nice with no evidence of any pleating or tethering.  There is ample room for the cord structures to exit the mesh.  The tip of my fifth digit to fit easily.  Hemostasis achieved.  The aponeurosis of the external bleed closed with 2-0 Vicryl.  3-0 Vicryl was used to approximate Scarpa's fascia and 4 Monocryl was used to close skin in a subcuticular fashion.  The umbilical hernia was addressed.  Curvilinear incision made along the base the umbilicus.  Dissection carried down and the umbilicus elevated off the fascia with Metzenbaum scissors.  A 1 cm defect was noted.  This is closed with a single figure-of-eight stitch of #1 Novafil.  Hemostasis achieved.  Wound closed with 3-0 Vicryl for Monocryl.  Dermabond applied.  All counts found to be correct.  The patient was then  awoke extubated taken recovery in satisfactory condition.

## 2019-08-30 ENCOUNTER — Encounter: Payer: Self-pay | Admitting: *Deleted

## 2019-08-30 NOTE — Addendum Note (Signed)
Addendum  created 08/30/19 1505 by Ezell Poke, Ernesta Amble, CRNA   Charge Capture section accepted

## 2019-08-31 NOTE — Progress Notes (Deleted)
Primary Physician/Referring:  Benito Mccreedy, MD  Patient ID: Scott Chavez, male    DOB: 05-22-1955, 64 y.o.   MRN: [id number]  No chief complaint on file.  HPI:    Scott Chavez  is a 64 y.o. African American male  with known PAD with multiple revascularizations by Dr. Bridgett Larsson, hypertension, hyperlipidemia, previous kidney transplant at [facility], microcytic anemia being followed by oncology, former tobacco use, history of GI bleed in Oct 2020 now off ASA, asymptomatic bilateral carotid stenosis, recently seen for hypertension follow up.  Underwent echocardiogram and now presents for follow up. *** He states that he is doing well. No complaints. Continues to do all activities as he wants without limitations. He reports blood pressure has been well controlled, states he was rushing to get here and just took his medications. BP has been well controlled on his recent visit with Vascular surgery. Claudication symptoms have been stable. Has had slight worsening in ABI, and is being followed closely by Dr.Chen. He may potentially need PV angiogram; however, this has been deferred until he can further discuss with Dr. Clover Mealy.   He has pending upcoming left knee arthroscopy with Dr. French Ana. Will also have upcoming hernia surgery in the near future.  He is a former cigarette smoker that is now using vape. ***  Past Medical History:  Diagnosis Date  . ADHD   . Anemia    pt. not sure, but reports he is taking Fe for one month now.  . Aorto-iliac disease ([place])   . Bladder dysfunction   . ESRD (end stage renal disease) [facility])    transplant- [facility]/2010[facility] , followed by Dr. Moshe Cipro   . GERD (gastroesophageal reflux disease)   . Hemodialysis patient [facility])    "on dialysis 1998-2010" 04/25/2013)  . Hepatitis C    has been treated with Harvoni  . History of renal failure   . Hypercholesteremia   . Hypertension   . Internal hemorrhoids    Colonoscopy 2010 and anoscopy 2015   . Peripheral vascular disease ([place])   . Pneumonia   . Self-catheterizes urinary bladder    pt. choice, but he reports that he desires to do this to avoid retention    Past Surgical History:  Procedure Laterality Date  . ABDOMINAL AORTAGRAM N/A 03/09/2013   Procedure: ABDOMINAL Maxcine Ham;  Surgeon: Elam Dutch, MD;  Location: [facility] CATH LAB;  Service: Cardiovascular;  Laterality: N/A;  . AV FISTULA PLACEMENT Left 1998   "removed 2011" (04/25/2013)  . AV FISTULA REPAIR Left    removal with partial vein removed  . COLONOSCOPY    . ENDARTERECTOMY FEMORAL Right 04/17/2015   Procedure: ENDARTERECTOMY RIGHT ILIOFEMORAL WITH PROFUNDOPLASTY;  Surgeon: Conrad Kersey, MD;  Location: [place];  Service: Vascular;  Laterality: Right;  . ENDARTERECTOMY FEMORAL Right 10/25/2016   Procedure: RIGHT ILIOFEMORAL ENDARTERECTOMY WITH BOVINE PERICARDIAL PATCH ANGIOPLASTY;  Surgeon: Conrad Queets, MD;  Location: [place];  Service: Vascular;  Laterality: Right;  . ESOPHAGOGASTRODUODENOSCOPY (EGD) WITH PROPOFOL N/A 02/05/2019   Procedure: ESOPHAGOGASTRODUODENOSCOPY (EGD) WITH PROPOFOL;  Surgeon: Clarene Essex, MD;  Location: [facility] ENDOSCOPY;  Service: Endoscopy;  Laterality: N/A;  . FEMORAL-POPLITEAL BYPASS GRAFT Left 04/24/2013   Procedure: BYPASS GRAFT COMMON FEMORALARTERY-BELOW KNEE POPLITEAL ARTERY WITH GREATER SAPHENOUS VEIN;  Surgeon: Conrad [place], MD;  Location: [place];  Service: Vascular;  Laterality: Left;  . FEMORAL-POPLITEAL BYPASS GRAFT Right 04/17/2015   Procedure: BYPASS GRAFT RIGHT FEMORALTO BELOW KNEE POPLITEAL ARTERY USING RIGHT  NON REVERSED GREATER SAPPHENOUS VEIN;  Surgeon: Conrad Hickory, MD;  Location: [place];  Service: Vascular;  Laterality: Right;  . INGUINAL HERNIA REPAIR Left 08/29/2019   Procedure: LEFT INGUINAL HERNIA REPAIR WITH MESH;  Surgeon: Erroll Luna, MD;  Location: [place];  Service: General;  Laterality: Left;  . INTRAOPERATIVE ARTERIOGRAM Left 04/24/2013   Procedure:  INTRA OPERATIVE ARTERIOGRAM;  Surgeon: Conrad Ravalli, MD;  Location: [place];  Service: Vascular;  Laterality: Left;  . KIDNEY TRANSPLANT    . LOWER EXTREMITY ANGIOGRAM Bilateral 03/09/2013   Procedure: LOWER EXTREMITY ANGIOGRAM;  Surgeon: Elam Dutch, MD;  Location: [facility] CATH LAB;  Service: Cardiovascular;  Laterality: Bilateral;  . LOWER EXTREMITY ANGIOGRAM Right 10/25/2013   Procedure: LOWER EXTREMITY ANGIOGRAM;  Surgeon: Conrad Herndon, MD;  Location: [facility] CATH LAB;  Service: Cardiovascular;  Laterality: Right;  . LOWER EXTREMITY ANGIOGRAM Right 12/13/2013   Procedure: LOWER EXTREMITY ANGIOGRAM;  Surgeon: Conrad [place], MD;  Location: [facility] CATH LAB;  Service: Cardiovascular;  Laterality: Right;  . NEPHRECTOMY RECIPIENT  2010  . NEPHRECTOMY TRANSPLANTED ORGAN    . PATCH ANGIOPLASTY Right 04/17/2015   Procedure: Rueben Bash PATCH ANGIOPLASTY, RIGHT ILEOFEMORAL;  Surgeon: Conrad [place]dale, MD;  Location: [place];  Service: Vascular;  Laterality: Right;  . PERIPHERAL VASCULAR CATHETERIZATION N/A 04/10/2015   Procedure: Abdominal Aortogram;  Surgeon: Conrad Paris, MD;  Location: [place] CV LAB;  Service: Cardiovascular;  Laterality: N/A;  . PERIPHERAL VASCULAR CATHETERIZATION N/A 05/10/2016   Procedure: Abdominal Aortogram w/Lower Extremity;  Surgeon: Conrad Wallace, MD;  Location: [place] CV LAB;  Service: Cardiovascular;  Laterality: N/A;  . s/p cadaveric renal transplant  December 2010   [place] Left 09/24/2015   Procedure: EXCISION OF THROMBOSED RADIOCEPHALIC ARTERIOVENOUS FISTULA;  Surgeon: Conrad [place], MD;  Location: [place];  Service: Vascular;  Laterality: Left;  . UMBILICAL HERNIA REPAIR N/A 08/29/2019   Procedure: UMBILICAL HERNIA REPAIR WITH MESH;  Surgeon: Erroll Luna, MD;  Location: [place];  Service: General;  Laterality: N/A;  . VEIN HARVEST Right 04/17/2015   Procedure: RIGHT GREATER [place] ;  Surgeon: Conrad [place], MD;   Location: [place];  Service: Vascular;  Laterality: Right;   Family History  Problem Relation Age of Onset  . Hypertension Father   . Other Father        amputation  . Hypertension Mother     Social History   Tobacco Use  . Smoking status: Former Smoker    Packs/day: 0.50    Years: 34.00    Pack years: 17.00    Types: E-cigarettes    Quit date: 02/17/2003    Years since quitting: 16.5  . Smokeless tobacco: Former Systems developer  . Tobacco comment: vapor cigrarette  Substance Use Topics  . Alcohol use: Not Currently    Alcohol/week: 0.0 standard drinks   Marital Status: Single  ROS  ***Review of Systems  Constitution: Negative for chills, decreased appetite, malaise/fatigue and weight gain.  Cardiovascular: Positive for claudication. Negative for chest pain, dyspnea on exertion, leg swelling, orthopnea, palpitations and syncope.  Endocrine: Negative for cold intolerance.  Hematologic/Lymphatic: Does not bruise/bleed easily.  Musculoskeletal: Positive for joint pain (left knee). Negative for joint swelling.  Gastrointestinal: Positive for heartburn. Negative for abdominal pain, anorexia, change in bowel habit, hematochezia and melena.  Neurological: Negative for headaches and light-headedness.  Psychiatric/Behavioral: Negative for depression and substance abuse.  Objective  There were no vitals taken for this visit.  Vitals with BMI 08/29/2019 08/29/2019 08/29/2019  Height - - -  Weight - - -  BMI - - -  Systolic 161 - 096  Diastolic 83 - 75  Pulse 63 66 62     ***Physical Exam  Constitutional: He appears well-developed and well-nourished.  HENT:  Head: Atraumatic.  Eyes: Conjunctivae are normal.  Neck: No JVD present. No thyromegaly present.  Cardiovascular: Normal rate, regular rhythm and intact distal pulses. Exam reveals no gallop.  No murmur heard. Pulses:      Carotid pulses are on the left side with bruit.      Femoral pulses are 2+ on the right side with bruit and 2+  on the left side with bruit.      Popliteal pulses are 2+ on the right side and 2+ on the left side.       Dorsalis pedis pulses are 0 on the right side and 0 on the left side.  Pulmonary/Chest: Effort normal and breath sounds normal. No accessory muscle usage. No respiratory distress.  Abdominal: Soft. Bowel sounds are normal.  Musculoskeletal:        General: Normal range of motion.     Cervical back: Neck supple. Normal.  Neurological: He is alert.  Skin: Skin is warm and dry.  Psychiatric: He has a normal mood and affect.   Laboratory examination:   Recent Labs    02/06/19 0209 02/07/19 0538 08/28/19 1000  NA 139 138 139  K 4.0 4.0 4.3  CL 110 107 105  CO2 23 23 25   GLUCOSE 101* 108* 89  BUN 20 11 11   CREATININE 1.04 0.95 1.31*  CALCIUM 8.4* 8.8* 9.6  GFRNONAA >60 >60 58*  GFRAA >60 >60 >60   estimated creatinine clearance is 57.1 mL/min (A) (by C-G formula based on SCr of 1.31 mg/dL (H)).  CMP Latest Ref Rng & Units 08/28/2019 02/07/2019 02/06/2019  Glucose 70 - 99 mg/dL 89 108(H) 101(H)  BUN 8 - 23 mg/dL 11 11 20   Creatinine 0.61 - 1.24 mg/dL 1.31(H) 0.95 1.04  Sodium 135 - 145 mmol/L 139 138 139  Potassium 3.5 - 5.1 mmol/L 4.3 4.0 4.0  Chloride 98 - 111 mmol/L 105 107 110  CO2 22 - 32 mmol/L 25 23 23   Calcium 8.9 - 10.3 mg/dL 9.6 8.8(L) 8.4(L)  Total Protein 6.5 - 8.1 g/dL 7.1 5.4(L) -  Total Bilirubin 0.3 - 1.2 mg/dL 0.5 0.5 -  Alkaline Phos 38 - 126 U/L 53 30(L) -  AST 15 - 41 U/L 21 11(L) -  ALT 0 - 44 U/L 12 9 -   CBC Latest Ref Rng & Units 08/28/2019 02/07/2019 02/06/2019  WBC 4.0 - 10.5 K/uL 4.1 5.0 5.8  Hemoglobin 13.0 - 17.0 g/dL 8.5(L) 7.4(L) 7.5(L)  Hematocrit 39.0 - 52.0 % 29.9(L) 23.5(L) 24.4(L)  Platelets 150 - 400 K/uL 257 122(L) 114(L)   Lipid Panel     Component Value Date/Time   CHOL 174 02/16/2016 1514   TRIG 55 02/16/2016 1514   HDL 80 02/16/2016 1514   CHOLHDL 2.2 02/16/2016 1514   VLDL 11 02/16/2016 1514   LDLCALC 83 02/16/2016 1514    HEMOGLOBIN A1C No results found for: HGBA1C, MPG TSH No results for input(s): TSH in the last 8760 hours.  External labs:   ***04/13/2017: Iron studies normal. RBC 5.14, hemoglobin 12.8, hematocrit 40, microcytic indices, CBC otherwise normal. Hemoglobin A1c 5.7%. Creatinine 1.07, EGFR 75/86,  potassium 4.8, CMP normal. TSH 1.17.  Medications and allergies   Allergies  Allergen Reactions  . Tape Rash and Other (See Comments)    NO Silk tape, please!!     Current Outpatient Medications  Medication Instructions  . albuterol (PROVENTIL HFA;VENTOLIN HFA) 108 (90 Base) MCG/ACT inhaler 1-2 puffs, Inhalation, Every 6 hours PRN  . citalopram (CELEXA) 20 mg, Oral, 2 times daily  . Cyanocobalamin (VITAMIN B-12 PO) 1 tablet, Oral, Daily  . ferrous sulfate 325 mg, Oral, 2 times daily  . ketotifen (ZADITOR) 0.025 % ophthalmic solution 1 drop, Both Eyes, 2 times daily PRN  . lisdexamfetamine (VYVANSE) 20 mg, Oral, 2 times daily  . metoprolol succinate (TOPROL-XL) 25 mg, Oral, 2 times daily  . mycophenolate (CELLCEPT) 250-500 mg, Oral, See admin instructions, Take 500 mg by mouth in the morning and 250 mg in the evening  . oxyCODONE (OXY IR/ROXICODONE) 5 mg, Oral, Every 6 hours PRN  . pregabalin (LYRICA) 50 mg, Oral, 3 times daily  . Pyridoxine HCl (VITAMIN B-6 PO) 1 tablet, Oral, 3 times weekly  . rosuvastatin (CRESTOR) 5 mg, Oral, Daily at bedtime  . tacrolimus (PROGRAF) 3-4 mg, Oral, 2 times daily, 4 mg in the morning and 3 mg in the evening  . tadalafil (CIALIS) 20 mg, Daily PRN  . tamsulosin (FLOMAX) 0.4 mg, Daily  . zolpidem (AMBIEN) 5 mg, Oral, Daily at bedtime   Radiology:   No results found.  Cardiac Studies:   Dr. Bridgett Larsson; left iliofem-pop bypass in 2014 and right iliofem-pop bypass in 2016 and multiple revascularizations.  Lexiscan myoview stress test 05/27/2017: 1. The resting electrocardiogram demonstrated normal sinus rhythm, RAE, LAD, LAFB, normal resting conduction and  no resting arrhythmias. Stress EKG is non-diagnostic for ischemia as it a pharmacologic stress using Lexiscan. Stress symptoms included dizziness. 2. Myocardial perfusion imaging is normal. Overall left ventricular systolic function was normal without regional wall motion abnormalities. The left ventricular ejection fraction was 52%. This is a low risk study.  Carotid artery duplex  03/16/2019: Minimal stenosis in the right internal carotid artery (minimal). Stenosis in the left internal carotid artery (16-49%). Stenosis in the left common carotid artery (<50%). There is moderate diffuse plaque noted in bilateral carotid arteries (see images). Antegrade right vertebral artery flow. Antegrade left vertebral artery flow. Follow up in one year is appropriate if clinically indicated.  Echocardiogram 05/29/2019:  Left ventricle cavity is normal in size. Severe concentric hypertrophy of the left ventricle. Septum and posterior wall measures 1.7 cm. Normal global wall motion. Normal LV systolic function with EF 63%. Doppler evidence of grade I (impaired) diastolic dysfunction, normal LAP.  Left atrial cavity is mildly dilated.  Trileaflet aortic valve. Increased LVOT velocities likely due to LV hypertrophy and mild LVOT obstruction. No valvular stenosis. Trace aortic regurgitation.  Moderate (Grade II) mitral regurgitation.  Mild to moderate tricuspid regurgitation. Estimated pulmonary artery systolic pressure is 27 mmHg.  EKG  ***   02/14/2019 at [facility] ER: Sinus tachycardia at 113 bpm, right atrial enlargement, left axis deviation, left anterior fascicular block. PRWP, probably normal variant. Nonspecific T abnormality.  05/18/2017: Normal sinus rhythm at rate of 74 bpm, right atrial enlargement, left axis deviation, left anterior fascicular block. Poor R-wave progression, probably normal variant. Nonspecific T abnormality.   Assessment     ICD-10-CM   1. Primary hypertension  I10   2. Moderate  mitral regurgitation  I34.0   3. Preoperative cardiovascular examination  Z01.810   4. PAD (peripheral artery  disease) ([place])  I73.9   5. History of GI bleed  Z87.19   6. Bilateral carotid artery stenosis  I65.23      No orders of the defined types were placed in this encounter.   There are no discontinued medications.  Recommendations:   ***AISEA BOULDIN  is a 64 y.o. male  with known PAD with multiple revascularizations by Dr. Bridgett Larsson, hypertension, hyperlipidemia, previous kidney transplant at [facility], microcytic anemia being followed by oncology, former tobacco use, history of GI bleed in Oct 2020 now off ASA, asymptomatic bilateral carotid stenosis, recently seen for hypertension follow up.  Underwent echocardiogram and now presents for follow up.  I reviewed and discussed his recent echocardiogram, has normal LVEF. Has moderate MR that will need continued surveillance.  He does have severe LVH, likely related to previously uncontrolled hypertension.  His blood pressure is elevated today in our office, but has been well controlled recently with this and also on his appointment with vascular surgery.  He states that he had just recently taking his medication prior to his appointment.  I have encouraged him to continue to monitor this at home and he will notify me if continues to be elevated.  Would recommend continuing with metoprolol succinate for severe LVH and mild LVOT obstruction.  Could potentially consider increasing this if needed or potentially adding Verapamil. No dyspnea on exertion. He does have some occasional mild chest discomfort after eating certain foods. Continue with Pantoprazole. He has had negative nuclear stress test in 2019.   In regard to upcoming left knee arthroscopy, feel that patient can be taken for surgery low risk for perioperative CV complications.  He has had negative nuclear stress test in 2019 and has normal EF by echocardiogram.  No current symptoms of  angina.  Blood pressure has overall been well controlled.  He will continue to follow-up with vascular surgery in regards to PAD management.  His symptoms of claudication have been stable.  He is not on dual antiplatelet therapy at this time view of his recent GI bleed, will defer to vascular surgery and GI for management of this.  He does have bilateral carotid stenosis that is asymptomatic and stable.  He will need repeat carotid duplex in 1 year.  Continue with Crestor, hyperlipidemia is being managed by his PCP.  We will arrange for him to follow back up with [place] in 3 months given his elevated blood pressure today to ensure adequate blood pressure control. ***  Adrian Prows, MD, [facility] 08/31/2019, 3:05 PM [facility] Pager: [phone] Office: [phone]

## 2019-09-03 ENCOUNTER — Ambulatory Visit: Payer: Medicare Other | Admitting: Cardiology

## 2019-09-06 DIAGNOSIS — N319 Neuromuscular dysfunction of bladder, unspecified: Secondary | ICD-10-CM | POA: Diagnosis not present

## 2019-09-06 DIAGNOSIS — D649 Anemia, unspecified: Secondary | ICD-10-CM | POA: Diagnosis not present

## 2019-09-06 DIAGNOSIS — Z940 Kidney transplant status: Secondary | ICD-10-CM | POA: Diagnosis not present

## 2019-09-06 DIAGNOSIS — I129 Hypertensive chronic kidney disease with stage 1 through stage 4 chronic kidney disease, or unspecified chronic kidney disease: Secondary | ICD-10-CM | POA: Diagnosis not present

## 2019-09-06 DIAGNOSIS — E7800 Pure hypercholesterolemia, unspecified: Secondary | ICD-10-CM | POA: Diagnosis not present

## 2019-09-28 NOTE — Progress Notes (Signed)
Primary Physician/Referring:  Benito Mccreedy, MD  Patient ID: Scott Chavez, male    DOB: 1956/02/17, 64 y.o.   MRN: [id number]  Chief Complaint  Patient presents with  . Follow-up    2 months  . Hypertension  . Dizziness   HPI:    Scott Chavez  is a 64 y.o. African American male with known PAD with multiple revascularizations and follows vascular surgery, hypertension, hyperlipidemia,  kidney transplant at [facility], microcytic anemia being followed by oncology, former tobacco use, history of GI bleed in Oct 2020 now off ASA, asymptomatic bilateral carotid stenosis.  He also has hypertrophic hypertensive heart disease. He states that he is doing well. No complaints. Continues to do all activities as he wants without limitations.  He has pending upcoming left knee arthroscopy with Dr. French Ana. Will also have upcoming hernia surgery in the near future.   Past Medical History:  Diagnosis Date  . ADHD   . Anemia    pt. not sure, but reports he is taking Fe for one month now.  . Aorto-iliac disease ([place])   . Bladder dysfunction   . ESRD (end stage renal disease) [facility])    trans[facility]- [facility] , followed by Dr. Moshe Cipro   . GERD (gastroesophageal reflux disease)   . Hemodialysis patient [facility])    "on dialysis 1998-2010" 04/25/2013)  . Hepatitis C    has been treated with Harvoni  . History of renal failure   . Hypercholesteremia   . Hypertension   . Internal hemorrhoids    Colonoscopy 2010 and anoscopy 2015  . Peripheral vascular disease ([place])   . Pneumonia   . Self-catheterizes urinary bladder    pt. choice, but he reports that he desires to do this to avoid retention    Past Surgical History:  Procedure Laterality Date  . ABDOMINAL AORTAGRAM N/A 03/09/2013   Procedure: ABDOMINAL Maxcine Ham;  Surgeon: Elam Dutch, MD;  Location[facility] CATH LAB;  Service: Cardiovascular;  Laterality: N/A;  . AV FISTULA PLACEMENT Left 1998   "removed 2011"  (04/25/2013)  . AV FISTULA REPAIR Left    removal with partial vein removed  . COLONOSCOPY    . ENDARTERECTOMY FEMORAL Right 04/17/2015   Procedure: ENDARTERECTOMY RIGHT ILIOFEMORAL WITH PROFUNDOPLASTY;  Surgeon: Conrad Montandon, MD;  Location: [place];  Service: Vascular;  Laterality: Right;  . ENDARTERECTOMY FEMORAL Right 10/25/2016   Procedure: RIGHT ILIOFEMORAL ENDARTERECTOMY WITH BOVINE PERICARDIAL PATCH ANGIOPLASTY;  Surgeon: Conrad Franklin, MD;  Location: [place];  Service: Vascular;  Laterality: Right;  . ESOPHAGOGASTRODUODENOSCOPY (EGD) WITH PROPOFOL N/A 02/05/2019   Procedure: ESOPHAGOGASTRODUODENOSCOPY (EGD) WITH PROPOFOL;  Surgeon: Clarene Essex, MD;  Location: [facility] ENDOS[facility];  Service: Endoscopy;  Laterality: N/A;  . FEMORAL-POPLITEAL BYPASS GRAFT Left 04/24/2013   Procedure: BYPASS GRAFT COMMON FEMORALARTERY-BELOW KNEE POPLITEAL ARTERY WITH GREATER SAPHENOUS VEIN;  Surgeon: Conrad Kanorado, MD;  Location: [place];  Service: Vascular;  Laterality: Left;  . FEMORAL-POPLITEAL BYPASS GRAFT Right 04/17/2015   Procedure: BYPASS GRAFT RIGHT FEMORALTO BELOW KNEE POPLITEAL ARTERY USING RIGHT NON REVERSED GREATER SAPPHENOUS VEIN;  Surgeon: Conrad Kanawha, MD;  Location: [place];  Service: Vascular;  Laterality: Right;  . HERNIA REPAIR  08/2019  . INGUINAL HERNIA REPAIR Left 08/29/2019   Procedure: LEFT INGUINAL HERNIA REPAIR WITH MESH;  Surgeon: Erroll Luna, MD;  Location: [place];  Service: General;  Laterality: Left;  . INTRAOPERATIVE ARTERIOGRAM Left 04/24/2013   Procedure: INTRA OPERATIVE ARTERIOGRAM;  Surgeon:  Conrad Ellendale, MD;  Location: [place];  Service: Vascular;  Laterality: Left;  . KIDNEY TRANSPLANT    . LOWER EXTREMITY ANGIOGRAM Bilateral 03/09/2013   Procedure: LOWER EXTREMITY ANGIOGRAM;  Surgeon: Elam Dutch, MD;  Location: [facility] CATH LAB;  Service: Cardiovascular;  Laterality: Bilateral;  . LOWER EXTREMITY ANGIOGRAM Right 10/25/2013   Procedure: LOWER EXTREMITY ANGIOGRAM;   Surgeon: Conrad Brookridge, MD;  Location: [facility] CATH LAB;  Service: Cardiovascular;  Laterality: Right;  . LOWER EXTREMITY ANGIOGRAM Right 12/13/2013   Procedure: LOWER EXTREMITY ANGIOGRAM;  Surgeon: Conrad Sheridan, MD;  Location: [facility] CATH LAB;  Service: Cardiovascular;  Laterality: Right;  . NEPHRECTOMY RECIPIENT  2010  . NEPHRECTOMY TRANSPLANTED ORGAN    . PATCH ANGIOPLASTY Right 04/17/2015   Procedure: Rueben Bash PATCH ANGIOPLASTY, RIGHT ILEOFEMORAL;  Surgeon: Conrad [place], MD;  Location: [place];  Service: Vascular;  Laterality: Right;  . PERIPHERAL VASCULAR CATHETERIZATION N/A 04/10/2015   Procedure: Abdominal Aortogram;  Surgeon: Conrad Marcus, MD;  Location: [place] CV LAB;  Service: Cardiovascular;  Laterality: N/A;  . PERIPHERAL VASCULAR CATHETERIZATION N/A 05/10/2016   Procedure: Abdominal Aortogram w/Lower Extremity;  Surgeon: Conrad Lakefield, MD;  Location: [place] CV LAB;  Service: Cardiovascular;  Laterality: N/A;  . s/p cadaveric renal transplant  December 2010   [place] Left 09/24/2015   Procedure: EXCISION OF THROMBOSED RADIOCEPHALIC ARTERIOVENOUS FISTULA;  Surgeon: Conrad Pisinemo, MD;  Location: [place];  Service: Vascular;  Laterality: Left;  . UMBILICAL HERNIA REPAIR N/A 08/29/2019   Procedure: UMBILICAL HERNIA REPAIR WITH MESH;  Surgeon: Erroll Luna, MD;  Location: [place];  Service: General;  Laterality: N/A;  . VEIN HARVEST Right 04/17/2015   Procedure: RIGHT GREATER [place] ;  Surgeon: Conrad [place], MD;  Location: [place];  Service: Vascular;  Laterality: Right;   Family History  Problem Relation Age of Onset  . Hypertension Father   . Other Father        amputation  . Hypertension Mother     Social History   Tobacco Use  . Smoking status: Former Smoker    Packs/day: 0.50    Years: 34.00    Pack years: 17.00    Types: E-cigarettes    Quit date: 02/17/2003    Years since quitting: 16.6  . Smokeless tobacco:  Former Systems developer  . Tobacco comment: vapor cigrarette  Substance Use Topics  . Alcohol use: Not Currently    Alcohol/week: 0.0 standard drinks   Marital Status: Single  ROS  Review of Systems  Cardiovascular: Positive for claudication. Negative for chest pain, dyspnea on exertion, leg swelling, orthopnea, palpitations and syncope.  Musculoskeletal: Positive for joint pain (left knee). Negative for joint swelling.  Gastrointestinal: Positive for heartburn. Negative for melena.   Objective  Blood pressure 112/70, pulse 99, resp. rate 17, height 5' 10"  (1.778 m), weight 156 lb (70.8 kg), head circumference 17" (43.2 cm), SpO2 100 %.  Vitals with BMI 10/03/2019 08/29/2019 08/29/2019  Height 5' 10"  - -  Weight 156 lbs - -  BMI 30.07 - -  Systolic 622 633 -  Diastolic 70 83 -  Pulse 99 63 66      Physical Exam  Constitutional: He appears well-developed and well-nourished.  Neck: No JVD present.  Cardiovascular: Normal rate, regular rhythm and intact distal pulses. Exam reveals no gallop.  Murmur heard.  Early systolic murmur is present with a grade of 2/6  at the upper right sternal border. Pulses:      Carotid pulses are on the left side with bruit.      Femoral pulses are 2+ on the right side with bruit and 2+ on the left side with bruit.      Popliteal pulses are 2+ on the right side and 2+ on the left side.       Dorsalis pedis pulses are 0 on the right side and 0 on the left side.  Pulmonary/Chest: Effort normal and breath sounds normal. No accessory muscle usage. No respiratory distress.  Abdominal: Soft. Bowel sounds are normal.  Musculoskeletal:     Cervical back: Normal.   Laboratory examination:   Recent Labs    02/06/19 0209 02/07/19 0538 08/28/19 1000  NA 139 138 139  K 4.0 4.0 4.3  CL 110 107 105  CO2 23 23 25   GLUCOSE 101* 108* 89  BUN 20 11 11   CREATININE 1.04 0.95 1.31*  CALCIUM 8.4* 8.8* 9.6  GFRNONAA >60 >60 58*  GFRAA >60 >60 >60   CrCl cannot be  calculated (Patient's most recent lab result is older than the maximum 21 days allowed.).  CMP Latest Ref Rng & Units 08/28/2019 02/07/2019 02/06/2019  Glucose 70 - 99 mg/dL 89 108(H) 101(H)  BUN 8 - 23 mg/dL 11 11 20   Creatinine 0.61 - 1.24 mg/dL 1.31(H) 0.95 1.04  Sodium 135 - 145 mmol/L 139 138 139  Potassium 3.5 - 5.1 mmol/L 4.3 4.0 4.0  Chloride 98 - 111 mmol/L 105 107 110  CO2 22 - 32 mmol/L 25 23 23   Calcium 8.9 - 10.3 mg/dL 9.6 8.8(L) 8.4(L)  Total Protein 6.5 - 8.1 g/dL 7.1 5.4(L) -  Total Bilirubin 0.3 - 1.2 mg/dL 0.5 0.5 -  Alkaline Phos 38 - 126 U/L 53 30(L) -  AST 15 - 41 U/L 21 11(L) -  ALT 0 - 44 U/L 12 9 -   CBC Latest Ref Rng & Units 08/28/2019 02/07/2019 02/06/2019  WBC 4.0 - 10.5 K/uL 4.1 5.0 5.8  Hemoglobin 13.0 - 17.0 g/dL 8.5(L) 7.4(L) 7.5(L)  Hematocrit 39.0 - 52.0 % 29.9(L) 23.5(L) 24.4(L)  Platelets 150 - 400 K/uL 257 122(L) 114(L)   External labs:   HDL 59.000 M 06/05/2019 LDL-C 76.000 M 11/06/2018 Cholesterol, total 139.000 M 06/05/2019 Triglycerides 61.000 M 06/05/2019  Glucose Random 89.000 08/28/2019  BUN 11.000 08/28/2019 Creatinine, Serum 1.310 08/28/2019  A1C 5.700 % 10/20/2018  TSH 1.330 UIU/ 10/20/2018  FOBT: Abnormal 02/04/2019 PSA 27.500 % 10/20/2018  04/13/2017: Iron studies normal. RBC 5.14, hemoglobin 12.8, hematocrit 40, microcytic indices, CBC otherwise normal. Hemoglobin A1c 5.7%. Creatinine 1.07, EGFR 75/86, potassium 4.8, CMP normal. TSH 1.17.  Medications and allergies   Allergies  Allergen Reactions  . Tape Rash and Other (See Comments)    NO Silk tape, please!!     Current Outpatient Medications  Medication Instructions  . albuterol (PROVENTIL HFA;VENTOLIN HFA) 108 (90 Base) MCG/ACT inhaler 1-2 puffs, Inhalation, Every 6 hours PRN  . citalopram (CELEXA) 20 mg, Oral, 2 times daily  . Cyanocobalamin (VITAMIN B-12 PO) 1 tablet, Oral, Daily  . ferrous sulfate 325 mg, Oral, 2 times daily  . ketotifen (ZADITOR) 0.025 % ophthalmic solution  1 drop, Both Eyes, 2 times daily PRN  . lisdexamfetamine (VYVANSE) 20 mg, Oral, 2 times daily  . metoprolol succinate (TOPROL-XL) 25 mg, Oral, 2 times daily  . mycophenolate (CELLCEPT) 250-500 mg, Oral, See admin instructions, Take 500 mg by mouth in the  morning and 250 mg in the evening  . Pyridoxine HCl (VITAMIN B-6 PO) 1 tablet, Oral, 3 times weekly  . rosuvastatin (CRESTOR) 5 mg, Oral, Daily at bedtime  . tacrolimus (PROGRAF) 3-4 mg, Oral, 2 times daily, 4 mg in the morning and 3 mg in the evening  . tadalafil (CIALIS) 20 mg, Daily PRN  . tamsulosin (FLOMAX) 0.4 mg, Daily  . zolpidem (AMBIEN) 5 mg, Oral, Daily at bedtime   Radiology:   No results found.  Cardiac Studies:   Dr. Bridgett Larsson; left iliofem-pop bypass in 2014 and right iliofem-pop bypass in 2016 and multiple revascularizations.  Lexiscan myoview stress test 05/27/2017: 1. The resting electrocardiogram demonstrated normal sinus rhythm, RAE, LAD, LAFB, normal resting conduction and no resting arrhythmias. Stress EKG is non-diagnostic for ischemia as it a pharmacologic stress using Lexiscan. Stress symptoms included dizziness. 2. Myocardial perfusion imaging is normal. Overall left ventricular systolic function was normal without regional wall motion abnormalities. The left ventricular ejection fraction was 52%. This is a low risk study.  Carotid artery duplex  03/16/2019: Minimal stenosis in the right internal carotid artery (minimal). Stenosis in the left internal carotid artery (16-49%). Stenosis in the left common carotid artery (<50%). There is moderate diffuse plaque noted in bilateral carotid arteries (see images). Antegrade right vertebral artery flow. Antegrade left vertebral artery flow. Follow up in one year is appropriate if clinically indicated.  Echocardiogram 05/29/2019:  Left ventricle cavity is normal in size. Severe concentric hypertrophy of the left ventricle. Septum and posterior wall measures 1.7 cm. Normal  global wall motion. Normal LV systolic function with EF 63%. Doppler evidence of grade I (impaired) diastolic dysfunction, normal LAP.  Left atrial cavity is mildly dilated.  Trileaflet aortic valve. Increased LVOT velocities likely due to LV hypertrophy and mild LVOT obstruction. No valvular stenosis. Trace aortic regurgitation.  Moderate (Grade II) mitral regurgitation.  Mild to moderate tricuspid regurgitation. Estimated pulmonary artery systolic pressure is 27 mmHg.  EKG  EKG 10/03/2019: Normal sinus rhythm with rate of 91 bpm, biatrial enlargement, left axis deviation, left anterior fascicular block.  Incomplete right bundle branch block.  LVH.  No significant change from 02/14/2019.   Assessment     ICD-10-CM   1. Primary hypertension  I10 EKG 12-Lead  2. PAD (peripheral artery disease) (HCC)  I73.9   3. Hypercholesteremia  E78.00      Outpatient Encounter Medications as of 10/03/2019  Medication Sig  . albuterol (PROVENTIL HFA;VENTOLIN HFA) 108 (90 Base) MCG/ACT inhaler Inhale 1-2 puffs into the lungs every 6 (six) hours as needed for wheezing or shortness of breath.  . citalopram (CELEXA) 40 MG tablet Take 20 mg by mouth 2 (two) times daily.   . Cyanocobalamin (VITAMIN B-12 PO) Take 1 tablet by mouth daily.  . ferrous sulfate 325 (65 FE) MG EC tablet Take 1 tablet (325 mg total) by mouth 2 (two) times daily.  Marland Kitchen ketotifen (ZADITOR) 0.025 % ophthalmic solution Place 1 drop into both eyes 2 (two) times daily as needed (for seasonal allergies).  . lisdexamfetamine (VYVANSE) 20 MG capsule Take 20 mg by mouth 2 (two) times daily.   . metoprolol succinate (TOPROL-XL) 25 MG 24 hr tablet Take 25 mg by mouth 2 (two) times daily.   . mycophenolate (CELLCEPT) 250 MG capsule Take 250-500 mg by mouth See admin instructions. Take 500 mg by mouth in the morning and 250 mg in the evening  . Pyridoxine HCl (VITAMIN B-6 PO) Take 1 tablet by mouth 3 (  three) times a week.   . rosuvastatin (CRESTOR) 5  MG tablet Take 5 mg by mouth at bedtime.   . tacrolimus (PROGRAF) 1 MG capsule Take 3-4 mg by mouth 2 (two) times daily. 4 mg in the morning and 3 mg in the evening  . tadalafil (CIALIS) 20 MG tablet Take 20 mg by mouth daily as needed for erectile dysfunction.  . tamsulosin (FLOMAX) 0.4 MG CAPS capsule Take 0.4 mg by mouth daily.  Marland Kitchen zolpidem (AMBIEN) 10 MG tablet Take 5 mg by mouth at bedtime.   . [DISCONTINUED] oxyCODONE (OXY IR/ROXICODONE) 5 MG immediate release tablet Take 1 tablet (5 mg total) by mouth every 6 (six) hours as needed for severe pain.  . [DISCONTINUED] pregabalin (LYRICA) 50 MG capsule Take 1 capsule (50 mg total) by mouth 3 (three) times daily.   No facility-administered encounter medications on file as of 10/03/2019.   Recommendations:   KOLT MCWHIRTER  is a 64 y.o.  AA male  with known PAD with multiple revascularizations and follows vascular surgery, hypertension, hyperlipidemia,  kidney transplant at [facility], microcytic anemia being followed by oncology, former tobacco use, history of GI bleed in Oct 2020 now off ASA, asymptomatic bilateral carotid stenosis.  He also has hypertrophic hypertensive heart disease.  He presents here for a 4-week office visit as his blood pressure was elevated, with the present medications, blood pressure is well controlled, he is essentially asymptomatic without medical evidence of heart failure.  Vascular disease is being managed by vascular surgery and his lipids are well controlled.  He has been abstinent from tobacco use.  I will see him back in 6 months and if he remains stable on annual basis from cardiac standpoint.  Adrian Prows, MD, [facility] 10/03/2019, 4:20 PM [facility] [place] Pager: [phone] Office: [phone]

## 2019-10-03 ENCOUNTER — Other Ambulatory Visit: Payer: Self-pay

## 2019-10-03 ENCOUNTER — Encounter: Payer: Self-pay | Admitting: Cardiology

## 2019-10-03 ENCOUNTER — Ambulatory Visit: Payer: Medicare Other | Admitting: Cardiology

## 2019-10-03 VITALS — BP 112/70 | HR 99 | Resp 17 | Ht 70.0 in | Wt 156.0 lb

## 2019-10-03 DIAGNOSIS — E7800 Pure hypercholesterolemia, unspecified: Secondary | ICD-10-CM | POA: Diagnosis not present

## 2019-10-03 DIAGNOSIS — I739 Peripheral vascular disease, unspecified: Secondary | ICD-10-CM | POA: Diagnosis not present

## 2019-10-03 DIAGNOSIS — I10 Essential (primary) hypertension: Secondary | ICD-10-CM

## 2019-10-09 DIAGNOSIS — Z940 Kidney transplant status: Secondary | ICD-10-CM | POA: Diagnosis not present

## 2019-10-09 DIAGNOSIS — I10 Essential (primary) hypertension: Secondary | ICD-10-CM | POA: Diagnosis not present

## 2019-10-09 DIAGNOSIS — I129 Hypertensive chronic kidney disease with stage 1 through stage 4 chronic kidney disease, or unspecified chronic kidney disease: Secondary | ICD-10-CM | POA: Diagnosis not present

## 2019-10-09 DIAGNOSIS — I517 Cardiomegaly: Secondary | ICD-10-CM | POA: Diagnosis not present

## 2019-10-09 DIAGNOSIS — Z1321 Encounter for screening for nutritional disorder: Secondary | ICD-10-CM | POA: Diagnosis not present

## 2019-10-09 DIAGNOSIS — Z1329 Encounter for screening for other suspected endocrine disorder: Secondary | ICD-10-CM | POA: Diagnosis not present

## 2019-10-09 DIAGNOSIS — Z01811 Encounter for preprocedural respiratory examination: Secondary | ICD-10-CM | POA: Diagnosis not present

## 2019-10-09 DIAGNOSIS — Z131 Encounter for screening for diabetes mellitus: Secondary | ICD-10-CM | POA: Diagnosis not present

## 2019-10-09 DIAGNOSIS — Z0001 Encounter for general adult medical examination with abnormal findings: Secondary | ICD-10-CM | POA: Diagnosis not present

## 2019-10-30 ENCOUNTER — Other Ambulatory Visit: Payer: Self-pay

## 2019-10-30 ENCOUNTER — Emergency Department (HOSPITAL_COMMUNITY)
Admission: EM | Admit: 2019-10-30 | Discharge: 2019-10-30 | Disposition: A | Discharge status: Home / Self Care | Payer: Medicare Other | Attending: Emergency Medicine | Admitting: Emergency Medicine

## 2019-10-30 ENCOUNTER — Encounter (HOSPITAL_COMMUNITY): Payer: Self-pay

## 2019-10-30 DIAGNOSIS — Y999 Unspecified external cause status: Secondary | ICD-10-CM | POA: Insufficient documentation

## 2019-10-30 DIAGNOSIS — S60944A Unspecified superficial injury of right ring finger, initial encounter: Secondary | ICD-10-CM | POA: Diagnosis present

## 2019-10-30 DIAGNOSIS — S60454A Superficial foreign body of right ring finger, initial encounter: Secondary | ICD-10-CM | POA: Insufficient documentation

## 2019-10-30 DIAGNOSIS — M7989 Other specified soft tissue disorders: Secondary | ICD-10-CM

## 2019-10-30 DIAGNOSIS — B182 Chronic viral hepatitis C: Secondary | ICD-10-CM | POA: Diagnosis not present

## 2019-10-30 DIAGNOSIS — I120 Hypertensive chronic kidney disease with stage 5 chronic kidney disease or end stage renal disease: Secondary | ICD-10-CM | POA: Diagnosis not present

## 2019-10-30 DIAGNOSIS — Z87891 Personal history of nicotine dependence: Secondary | ICD-10-CM | POA: Diagnosis not present

## 2019-10-30 DIAGNOSIS — N186 End stage renal disease: Secondary | ICD-10-CM | POA: Insufficient documentation

## 2019-10-30 DIAGNOSIS — Y939 Activity, unspecified: Secondary | ICD-10-CM | POA: Diagnosis not present

## 2019-10-30 DIAGNOSIS — Y929 Unspecified place or not applicable: Secondary | ICD-10-CM | POA: Diagnosis not present

## 2019-10-30 DIAGNOSIS — Z940 Kidney transplant status: Secondary | ICD-10-CM | POA: Diagnosis not present

## 2019-10-30 DIAGNOSIS — Z7901 Long term (current) use of anticoagulants: Secondary | ICD-10-CM | POA: Insufficient documentation

## 2019-10-30 DIAGNOSIS — X58XXXA Exposure to other specified factors, initial encounter: Secondary | ICD-10-CM | POA: Insufficient documentation

## 2019-10-30 NOTE — ED Notes (Signed)
Ring removed by Dr. Dolly Rias. Full rom

## 2019-10-30 NOTE — ED Provider Notes (Signed)
[facility] DEPT Provider Note   CSN: [id number] Arrival date & time: 10/30/19  0304     History Chief Complaint  Patient presents with  . Ring Removal    Scott Chavez is a 64 y.o. male.  Patient has ring stuck on his right pointer finger.  States that he has had on there for a while but his nose is future get more swollen lately and was turning it off multiple times and could not and the finger could get more swollen and now is painful so he presents here for removal.        Past Medical History:  Diagnosis Date  . ADHD   . Anemia    pt. not sure, but reports he is taking Fe for one month now.  . Aorto-iliac disease ([place])   . Bladder dysfunction   . ESRD (end stage renal disease) [facility] [place]    transplant- [facility]/2010[facility] , followed by Dr. Moshe Cipro   . GERD (gastroesophageal reflux disease)   . Hemodialysis patient [facility])    "on dialysis 1998-2010" 04/25/2013)  . Hepatitis C    has been treated with Harvoni  . History of renal failure   . Hypercholesteremia   . Hypertension   . Internal hemorrhoids    Colonoscopy 2010 and anoscopy 2015  . Peripheral vascular disease ([place])   . Pneumonia   . Self-catheterizes urinary bladder    pt. choice, but he reports that he desires to do this to avoid retention     Patient Active Problem List   Diagnosis Date Noted  . Acute upper GI bleeding 02/05/2019  . GI bleeding 02/04/2019  . AKI (acute kidney injury) ([place]) 02/04/2019  . Iron deficiency anemia 04/06/2017  . Peripheral arterial disease ([place]) 10/25/2016  . Chronic neck pain 03/15/2016  . Chronic low back pain without sciatica 03/15/2016  . Nonallopathic lesion of lumbosacral region 03/15/2016  . Nonallopathic lesion of sacral region 03/15/2016  . Nonallopathic lesion of thoracic region 03/15/2016  . Liver fibrosis 12/18/2015  . Arteriovenous fistula thrombosis ([place]) 10/16/2015  . Chronic hepatitis C without hepatic coma ([place])  09/03/2015  . Atherosclerosis of native arteries of extremities with intermittent claudication, right leg ([place]) 04/17/2015  . Pain in joint, lower leg 09/13/2014  . Hemorrhoids, internal, with bleeding 01/29/2014  . Pain in limb 08/17/2013  . Peripheral vascular disease, unspecified ([place]) 06/08/2013  . Redness of skin-Calf to foot LEFT 05/25/2013  . Tenderness in limb-Calf to foot LEFT 05/25/2013  . Swelling of limb-Calf to foot LEFT 05/25/2013  . Postoperative pain 05/02/2013  . PAD (peripheral artery disease) ([place]) 04/24/2013  . Preop cardiovascular exam 03/27/2013  . Essential hypertension 03/27/2013  . Hyperlipidemia 03/27/2013  . Atherosclerosis of native arteries of extremity with intermittent claudication ([place]) 02/16/2013  . History of renal transplant 02/16/2013  . Neurogenic dysfunction of the urinary bladder May 08, 2011  . Deceased-donor kidney transplant recipient 05-08-11    Past Surgical History:  Procedure Laterality Date  . ABDOMINAL AORTAGRAM N/A 03/09/2013   Procedure: ABDOMINAL Maxcine Ham;  Surgeon: Elam Dutch, MD;  Location: [facility] CATH LAB;  Service: Cardiovascular;  Laterality: N/A;  . AV FISTULA PLACEMENT Left 1998   "removed 2011" (04/25/2013)  . AV FISTULA REPAIR Left    removal with partial vein removed  . COLONOSCOPY    . ENDARTERECTOMY FEMORAL Right 04/17/2015   Procedure: ENDARTERECTOMY RIGHT ILIOFEMORAL WITH PROFUNDOPLASTY;  Surgeon: Conrad [place], MD;  Location: [facility];  Service: Vascular;  Laterality: Right;  . ENDARTERECTOMY FEMORAL Right 10/25/2016   Procedure: RIGHT ILIOFEMORAL ENDARTERECTOMY WITH BOVINE PERICARDIAL PATCH ANGIOPLASTY;  Surgeon: Conrad [place], MD;  Location: [facility];  Service: Vascular;  Laterality: Right;  . ESOPHAGOGASTRODUODENOSCOPY (EGD) WITH PROPOFOL N/A 02/05/2019   Procedure: ESOPHAGOGASTRODUODENOSCOPY (EGD) WITH PROPOFOL;  Surgeon: Clarene Essex, MD;  Location: [facility] ENDOSCOPY;  Service: Endoscopy;  Laterality: N/A;  .  FEMORAL-POPLITEAL BYPASS GRAFT Left 04/24/2013   Procedure: BYPASS GRAFT COMMON FEMORALARTERY-BELOW KNEE POPLITEAL ARTERY WITH GREATER SAPHENOUS VEIN;  Surgeon: Conrad Seaton, MD;  Location: [place];  Service: Vascular;  Laterality: Left;  . FEMORAL-POPLITEAL BYPASS GRAFT Right 04/17/2015   Procedure: BYPASS GRAFT RIGHT FEMORALTO BELOW KNEE POPLITEAL ARTERY USING RIGHT NON REVERSED GREATER SAPPHENOUS VEIN;  Surgeon: Conrad [place], MD;  Location: [facility];  Service: Vascular;  Laterality: Right;  . HERNIA REPAIR  08/2019  . INGUINAL HERNIA REPAIR Left 08/29/2019   Procedure: LEFT INGUINAL HERNIA REPAIR WITH MESH;  Surgeon: Erroll Luna, MD;  Location: [facility];  Service: General;  Laterality: Left;  . INTRAOPERATIVE ARTERIOGRAM Left 04/24/2013   Procedure: INTRA OPERATIVE ARTERIOGRAM;  Surgeon: Conrad [place], MD;  Location: [facility];  Service: Vascular;  Laterality: Left;  . KIDNEY TRANSPLANT    . LOWER EXTREMITY ANGIOGRAM Bilateral 03/09/2013   Procedure: LOWER EXTREMITY ANGIOGRAM;  Surgeon: Elam Dutch, MD;  Location: [facility] CATH LAB;  Service: Cardiovascular;  Laterality: Bilateral;  . LOWER EXTREMITY ANGIOGRAM Right 10/25/2013   Procedure: LOWER EXTREMITY ANGIOGRAM;  Surgeon: Conrad [place], MD;  Location: [facility] CATH LAB;  Service: Cardiovascular;  Laterality: Right;  . LOWER EXTREMITY ANGIOGRAM Right 12/13/2013   Procedure: LOWER EXTREMITY ANGIOGRAM;  Surgeon: Conrad [place], MD;  Location: [facility] CATH LAB;  Service: Cardiovascular;  Laterality: Right;  . NEPHRECTOMY RECIPIENT  2010  . NEPHRECTOMY TRANSPLANTED ORGAN    . PATCH ANGIOPLASTY Right 04/17/2015   Procedure: Rueben Bash PATCH ANGIOPLASTY, RIGHT ILEOFEMORAL;  Surgeon: Conrad [place], MD;  Location: [facility];  Service: Vascular;  Laterality: Right;  . PERIPHERAL VASCULAR CATHETERIZATION N/A 04/10/2015   Procedure: Abdominal Aortogram;  Surgeon: Conrad [place], MD;  Location: [facility] CV LAB;  Service: Cardiovascular;  Laterality: N/A;  .  PERIPHERAL VASCULAR CATHETERIZATION N/A 05/10/2016   Procedure: Abdominal Aortogram w/Lower Extremity;  Surgeon: Conrad Barrington, MD;  Location: [facility] CV LAB;  Service: Cardiovascular;  Laterality: N/A;  . s/p cadaveric renal transplant  December 2010   [place] Left 09/24/2015   Procedure: EXCISION OF THROMBOSED RADIOCEPHALIC ARTERIOVENOUS FISTULA;  Surgeon: Conrad [place], MD;  Location: [facility];  Service: Vascular;  Laterality: Left;  . UMBILICAL HERNIA REPAIR N/A 08/29/2019   Procedure: UMBILICAL HERNIA REPAIR WITH MESH;  Surgeon: Erroll Luna, MD;  Location: [place] [facility];  Service: General;  Laterality: N/A;  . VEIN HARVEST Right 04/17/2015   Procedure: RIGHT GREATER [place] ;  Surgeon: Conrad , MD;  Location: [facility];  Service: Vascular;  Laterality: Right;       Family History  Problem Relation Age of Onset  . Hypertension Father   . Other Father        amputation  . Hypertension Mother     Social History   Tobacco Use  . Smoking status: Former Smoker    Packs/day: 0.50    Years: 34.00    Pack years: 17.00    Types: E-cigarettes    Quit date: 02/17/2003  Years since quitting: 16.7  . Smokeless tobacco: Former Systems developer  . Tobacco comment: vapor cigrarette  Vaping Use  . Vaping Use: Every day  Substance Use Topics  . Alcohol use: Not Currently    Alcohol/week: 0.0 standard drinks  . Drug use: Not Currently    Types: Cocaine, Marijuana, Heroin, Other-see comments    Comment: pt states used TCH a few weeks ago/ 04/25/2013 "abused in the past ; stopped  11/13/2002.....states smokes marijuana again 2021    Home Medications Prior to Admission medications   Medication Sig Start Date End Date Taking? Authorizing Provider  albuterol (PROVENTIL HFA;VENTOLIN HFA) 108 (90 Base) MCG/ACT inhaler Inhale 1-2 puffs into the lungs every 6 (six) hours as needed for wheezing or shortness of breath. 07/17/18   Rancour,  Annie Main, MD  citalopram (CELEXA) 40 MG tablet Take 20 mg by mouth 2 (two) times daily.  02/18/17   [provider]  Cyanocobalamin (VITAMIN B-12 PO) Take 1 tablet by mouth daily.    [provider]  ferrous sulfate 325 (65 FE) MG EC tablet Take 1 tablet (325 mg total) by mouth 2 (two) times daily. 02/07/19   Nita Sells, MD  ketotifen (ZADITOR) 0.025 % ophthalmic solution Place 1 drop into both eyes 2 (two) times daily as needed (for seasonal allergies).    [provider]  lisdexamfetamine (VYVANSE) 20 MG capsule Take 20 mg by mouth 2 (two) times daily.     [provider]  metoprolol succinate (TOPROL-XL) 25 MG 24 hr tablet Take 25 mg by mouth 2 (two) times daily.  08/16/17   [provider]  mycophenolate (CELLCEPT) 250 MG capsule Take 250-500 mg by mouth See admin instructions. Take 500 mg by mouth in the morning and 250 mg in the evening    [provider]  Pyridoxine HCl (VITAMIN B-6 PO) Take 1 tablet by mouth 3 (three) times a week.     [provider]  rosuvastatin (CRESTOR) 5 MG tablet Take 5 mg by mouth at bedtime.     [provider]  tacrolimus (PROGRAF) 1 MG capsule Take 3-4 mg by mouth 2 (two) times daily. 4 mg in the morning and 3 mg in the evening    [provider]  tadalafil (CIALIS) 20 MG tablet Take 20 mg by mouth daily as needed for erectile dysfunction.    [provider]  tamsulosin (FLOMAX) 0.4 MG CAPS capsule Take 0.4 mg by mouth daily.    [provider]  zolpidem (AMBIEN) 10 MG tablet Take 5 mg by mouth at bedtime.     [provider]    Allergies    Tape  Review of Systems   Review of Systems  All other systems reviewed and are negative.   Physical Exam Updated Vital Signs BP 124/74 (BP Location: Right Arm)   Pulse 74   Temp 98.2 F (36.8 C) (Oral)   Resp 18   Ht 5\' 10"  (1.778 m)   Wt 70 kg   SpO2 98%   BMI 22.14 kg/m   Physical  Exam Vitals and nursing note reviewed.  Constitutional:      Appearance: He is well-developed.  HENT:     Head: Normocephalic and atraumatic.     Mouth/Throat:     Mouth: Mucous membranes are moist.     Pharynx: Oropharynx is clear.  Eyes:     Pupils: Pupils are equal, round, and reactive to light.  Cardiovascular:     Rate and  Rhythm: Normal rate.  Pulmonary:     Effort: Pulmonary effort is normal. No respiratory distress.  Abdominal:     General: There is no distension.  Musculoskeletal:        General: No swelling or tenderness. Normal range of motion.     Cervical back: Normal range of motion.  Neurological:     General: No focal deficit present.     Mental Status: He is alert.     ED Results / Procedures / Treatments   Labs (all labs ordered are listed, but only abnormal results are displayed) Labs Reviewed - No data to display  EKG None  Radiology No results found.  Procedures .Foreign Body Removal  Date/Time: 10/30/2019 7:56 AM Performed by: Merrily Pew, MD Authorized by: Merrily Pew, MD  Intake: r index finger.  Sedation: Patient sedated: no  Patient restrained: no Patient cooperative: yes Complexity: simple 1 objects recovered. Objects recovered: ring removed via ring cutter Post-procedure assessment: foreign body removed Patient tolerance: patient tolerated the procedure well with no immediate complications Comments: Ring removed with ring cutter. No obvious damage to skin or finger.    (including critical care time)  Medications Ordered in ED Medications - No data to display  ED Course  I have reviewed the triage vital signs and the nursing notes.  Pertinent labs & imaging results that were available during my care of the patient were reviewed by me and considered in my medical decision making (see chart for details).    MDM Rules/Calculators/A&P                          Ring removed. Finger ok. \  Final Clinical Impression(s) /  ED Diagnoses Final diagnoses:  Finger swelling    Rx / DC Orders ED Discharge Orders    None       Pina Sirianni, [name], MD 10/30/19 [phone]

## 2019-10-30 NOTE — ED Triage Notes (Signed)
Pt has ring stuck on right 2nd digit

## 2019-11-01 ENCOUNTER — Telehealth: Payer: Self-pay | Admitting: Pharmacist

## 2019-11-01 ENCOUNTER — Other Ambulatory Visit: Payer: Self-pay | Admitting: Pharmacist

## 2019-11-01 DIAGNOSIS — E7800 Pure hypercholesterolemia, unspecified: Secondary | ICD-10-CM

## 2019-11-01 MED ORDER — ROSUVASTATIN CALCIUM 5 MG PO TABS: 5.0000 mg | ORAL_TABLET | Freq: Every day | ORAL | 2 refills | Status: DC

## 2019-11-01 NOTE — Telephone Encounter (Signed)
PCM check in. Pt hasnt been checking his BP readings. Pt planing on returning to checking his BP readings regularly moving forward. Denies any recent complains of CP, SOB, HA, lightheadedness, dizziness. Denies any recent syncope or falls. Reports to doing 10-20 mins of resistance training 3-4 x/week with resistant brands. Still going fishing and hiking to relax and distress. Med list reviewed. Rx for Crestor pended for Dr. Einar Gip.

## 2019-11-22 DIAGNOSIS — E7800 Pure hypercholesterolemia, unspecified: Secondary | ICD-10-CM | POA: Diagnosis not present

## 2019-11-22 DIAGNOSIS — J301 Allergic rhinitis due to pollen: Secondary | ICD-10-CM | POA: Diagnosis not present

## 2019-11-22 DIAGNOSIS — Z87891 Personal history of nicotine dependence: Secondary | ICD-10-CM | POA: Diagnosis not present

## 2019-11-22 DIAGNOSIS — K219 Gastro-esophageal reflux disease without esophagitis: Secondary | ICD-10-CM | POA: Diagnosis not present

## 2019-11-22 DIAGNOSIS — I10 Essential (primary) hypertension: Secondary | ICD-10-CM | POA: Diagnosis not present

## 2019-11-28 ENCOUNTER — Telehealth: Payer: Self-pay

## 2019-11-28 ENCOUNTER — Telehealth: Payer: Self-pay | Admitting: *Deleted

## 2019-11-28 NOTE — Telephone Encounter (Signed)
Telephone call x 2 to patient to attempt to schedule for procedure with Dr. Carlis Abbott as recommended from office visit with PA on 06/04/19. In the middle of conversation, phone call disconnected. Attempted to reach pt and call when straight to voicemail. Unable to leave a voice message.

## 2019-12-01 DIAGNOSIS — I10 Essential (primary) hypertension: Secondary | ICD-10-CM | POA: Diagnosis not present

## 2019-12-07 DIAGNOSIS — I10 Essential (primary) hypertension: Secondary | ICD-10-CM | POA: Diagnosis not present

## 2019-12-07 DIAGNOSIS — I517 Cardiomegaly: Secondary | ICD-10-CM | POA: Diagnosis not present

## 2019-12-10 ENCOUNTER — Other Ambulatory Visit: Payer: Self-pay

## 2019-12-11 ENCOUNTER — Other Ambulatory Visit (HOSPITAL_COMMUNITY)
Admission: RE | Admit: 2019-12-11 | Discharge: 2019-12-11 | Disposition: A | Discharge status: Home / Self Care | Payer: Medicare Other | Source: Ambulatory Visit | Attending: Vascular Surgery | Admitting: Vascular Surgery

## 2019-12-11 DIAGNOSIS — Z20822 Contact with and (suspected) exposure to covid-19: Secondary | ICD-10-CM | POA: Diagnosis not present

## 2019-12-11 DIAGNOSIS — Z01812 Encounter for preprocedural laboratory examination: Secondary | ICD-10-CM | POA: Insufficient documentation

## 2019-12-11 LAB — SARS CORONAVIRUS 2 (TAT 6-24 HRS): SARS Coronavirus 2: NEGATIVE

## 2019-12-11 NOTE — Telephone Encounter (Signed)
Chart opened in error

## 2019-12-12 ENCOUNTER — Encounter (HOSPITAL_COMMUNITY)
Admission: RE | Disposition: A | Discharge status: Home / Self Care | Payer: Self-pay | Source: Home / Self Care | Attending: Vascular Surgery

## 2019-12-12 ENCOUNTER — Ambulatory Visit (HOSPITAL_COMMUNITY)
Admission: RE | Admit: 2019-12-12 | Discharge: 2019-12-12 | Disposition: A | Discharge status: Home / Self Care | Payer: Medicare Other | Attending: Vascular Surgery | Admitting: Vascular Surgery

## 2019-12-12 DIAGNOSIS — D649 Anemia, unspecified: Secondary | ICD-10-CM | POA: Insufficient documentation

## 2019-12-12 DIAGNOSIS — Z79899 Other long term (current) drug therapy: Secondary | ICD-10-CM | POA: Insufficient documentation

## 2019-12-12 DIAGNOSIS — Z539 Procedure and treatment not carried out, unspecified reason: Secondary | ICD-10-CM | POA: Insufficient documentation

## 2019-12-12 DIAGNOSIS — E7800 Pure hypercholesterolemia, unspecified: Secondary | ICD-10-CM | POA: Diagnosis not present

## 2019-12-12 DIAGNOSIS — N186 End stage renal disease: Secondary | ICD-10-CM | POA: Diagnosis not present

## 2019-12-12 DIAGNOSIS — K219 Gastro-esophageal reflux disease without esophagitis: Secondary | ICD-10-CM | POA: Insufficient documentation

## 2019-12-12 DIAGNOSIS — I70213 Atherosclerosis of native arteries of extremities with intermittent claudication, bilateral legs: Secondary | ICD-10-CM | POA: Diagnosis not present

## 2019-12-12 DIAGNOSIS — F17200 Nicotine dependence, unspecified, uncomplicated: Secondary | ICD-10-CM | POA: Insufficient documentation

## 2019-12-12 DIAGNOSIS — I120 Hypertensive chronic kidney disease with stage 5 chronic kidney disease or end stage renal disease: Secondary | ICD-10-CM | POA: Insufficient documentation

## 2019-12-12 DIAGNOSIS — Z940 Kidney transplant status: Secondary | ICD-10-CM | POA: Insufficient documentation

## 2019-12-12 DIAGNOSIS — Z992 Dependence on renal dialysis: Secondary | ICD-10-CM | POA: Insufficient documentation

## 2019-12-12 DIAGNOSIS — F909 Attention-deficit hyperactivity disorder, unspecified type: Secondary | ICD-10-CM | POA: Diagnosis not present

## 2019-12-12 LAB — POCT I-STAT, CHEM 8
BUN: 15 mg/dL (ref 8–23)
Calcium, Ion: 1.3 mmol/L (ref 1.15–1.40)
Chloride: 103 mmol/L (ref 98–111)
Creatinine, Ser: 1.3 mg/dL — ABNORMAL HIGH (ref 0.61–1.24)
Glucose, Bld: 114 mg/dL — ABNORMAL HIGH (ref 70–99)
HCT: 30 % — ABNORMAL LOW (ref 39.0–52.0)
Hemoglobin: 10.2 g/dL — ABNORMAL LOW (ref 13.0–17.0)
Potassium: 3.9 mmol/L (ref 3.5–5.1)
Sodium: 141 mmol/L (ref 135–145)
TCO2: 24 mmol/L (ref 22–32)

## 2019-12-12 SURGERY — ABDOMINAL AORTOGRAM W/LOWER EXTREMITY
Anesthesia: LOCAL

## 2019-12-12 MED ORDER — HEPARIN (PORCINE) IN NACL 1000-0.9 UT/500ML-% IV SOLN
INTRAVENOUS | Status: AC
  Filled 2019-12-12: qty 1000

## 2019-12-12 MED ORDER — LIDOCAINE HCL (PF) 1 % IJ SOLN
INTRAMUSCULAR | Status: AC
  Filled 2019-12-12: qty 30

## 2019-12-12 MED ORDER — SODIUM CHLORIDE 0.9 % IV SOLN: INTRAVENOUS | Status: DC

## 2019-12-12 NOTE — H&P (Signed)
H&P    MRN #:  [id number]  History of Present Illness: This is a 64 y.o. male presents for lower extremity arteriogram after evaluation by PA in February.  He was previously under the care of Dr. Bridgett Larsson and had bilateral common iliac stents in 2015.  He also has a left common femoral to below-knee popliteal bypass with non-reversed ipsilateral vein done on 04/24/2013.  And on the right he had an iliofemoral endarterectomy with a right common femoral to below-knee pop bypass with non-reversed ipsilateral saphenous vein in 2016.  During his surveillance imaging in February was noted to have distal anastomotic stenosis on the right also noted to have increasing external iliac stenosis.  States his left calf claudication is getting worse.  Has had a kidney transplant.  Past Medical History:  Diagnosis Date  . ADHD   . Anemia    pt. not sure, but reports he is taking Fe for one month now.  . Aorto-iliac disease ([place])   . Bladder dysfunction   . ESRD (end stage renal disease) [facility])    transplant- 12/13/2010[facility] , followed by Dr. Moshe Cipro   . GERD (gastroesophageal reflux disease)   . Hemodialysis patient [facility])    "on dialysis 1998-2010" 04/25/2013)  . Hepatitis C    has been treated with Harvoni  . History of renal failure   . Hypercholesteremia   . Hypertension   . Internal hemorrhoids    Colonoscopy 2010 and anoscopy 2015  . Peripheral vascular disease ([place])   . Pneumonia   . Self-catheterizes urinary bladder    pt. choice, but he reports that he desires to do this to avoid retention     Past Surgical History:  Procedure Laterality Date  . ABDOMINAL AORTAGRAM N/A 03/09/2013   Procedure: ABDOMINAL Maxcine Ham;  Surgeon: Elam Dutch, MD;  Location: [facility] CATH LAB;  Service: Cardiovascular;  Laterality: N/A;  . AV FISTULA PLACEMENT Left 1998   "removed 2011" (04/25/2013)  . AV FISTULA REPAIR Left    removal with partial vein removed  . COLONOSCOPY    . ENDARTERECTOMY  FEMORAL Right 04/17/2015   Procedure: ENDARTERECTOMY RIGHT ILIOFEMORAL WITH PROFUNDOPLASTY;  Surgeon: Conrad Cortland, MD;  Location: [place];  Service: Vascular;  Laterality: Right;  . ENDARTERECTOMY FEMORAL Right 10/25/2016   Procedure: RIGHT ILIOFEMORAL ENDARTERECTOMY WITH BOVINE PERICARDIAL PATCH ANGIOPLASTY;  Surgeon: Conrad Pleasantville, MD;  Location: [place];  Service: Vascular;  Laterality: Right;  . ESOPHAGOGASTRODUODENOSCOPY (EGD) WITH PROPOFOL N/A 02/05/2019   Procedure: ESOPHAGOGASTRODUODENOSCOPY (EGD) WITH PROPOFOL;  Surgeon: Clarene Essex, MD;  Location: [facility] ENDOSCOPY;  Service: Endoscopy;  Laterality: N/A;  . FEMORAL-POPLITEAL BYPASS GRAFT Left 04/24/2013   Procedure: BYPASS GRAFT COMMON FEMORALARTERY-BELOW KNEE POPLITEAL ARTERY WITH GREATER SAPHENOUS VEIN;  Surgeon: Conrad Bluebell, MD;  Location: [place];  Service: Vascular;  Laterality: Left;  . FEMORAL-POPLITEAL BYPASS GRAFT Right 04/17/2015   Procedure: BYPASS GRAFT RIGHT FEMORALTO BELOW KNEE POPLITEAL ARTERY USING RIGHT NON REVERSED GREATER SAPPHENOUS VEIN;  Surgeon: Conrad Artas, MD;  Location: [place];  Service: Vascular;  Laterality: Right;  . HERNIA REPAIR  08/2019  . INGUINAL HERNIA REPAIR Left 08/29/2019   Procedure: LEFT INGUINAL HERNIA REPAIR WITH MESH;  Surgeon: Erroll Luna, MD;  Location: [place];  Service: General;  Laterality: Left;  . INTRAOPERATIVE ARTERIOGRAM Left 04/24/2013   Procedure: INTRA OPERATIVE ARTERIOGRAM;  Surgeon: Conrad Proberta, MD;  Location: [place];  Service: Vascular;  Laterality: Left;  . KIDNEY TRANSPLANT    .  LOWER EXTREMITY ANGIOGRAM Bilateral 03/09/2013   Procedure: LOWER EXTREMITY ANGIOGRAM;  Surgeon: Elam Dutch, MD;  Location: [facility] CATH LAB;  Service: Cardiovascular;  Laterality: Bilateral;  . LOWER EXTREMITY ANGIOGRAM Right 10/25/2013   Procedure: LOWER EXTREMITY ANGIOGRAM;  Surgeon: Conrad [place] Park, MD;  Location: [facility] CATH LAB;  Service: Cardiovascular;  Laterality: Right;  . LOWER EXTREMITY  ANGIOGRAM Right 12/13/2013   Procedure: LOWER EXTREMITY ANGIOGRAM;  Surgeon: Conrad [place], MD;  Location: [facility] CATH LAB;  Service: Cardiovascular;  Laterality: Right;  . NEPHRECTOMY RECIPIENT  2010  . NEPHRECTOMY TRANSPLANTED ORGAN    . PATCH ANGIOPLASTY Right 04/17/2015   Procedure: Rueben Bash PATCH ANGIOPLASTY, RIGHT ILEOFEMORAL;  Surgeon: Conrad Morris, MD;  Location: [place];  Service: Vascular;  Laterality: Right;  . PERIPHERAL VASCULAR CATHETERIZATION N/A 04/10/2015   Procedure: Abdominal Aortogram;  Surgeon: Conrad [place], MD;  Location: [place] CV LAB;  Service: Cardiovascular;  Laterality: N/A;  . PERIPHERAL VASCULAR CATHETERIZATION N/A 05/10/2016   Procedure: Abdominal Aortogram w/Lower Extremity;  Surgeon: Conrad Fowlerton, MD;  Location: [place] CV LAB;  Service: Cardiovascular;  Laterality: N/A;  . s/p cadaveric renal transplant  December 2010   [place] Left 09/24/2015   Procedure: EXCISION OF THROMBOSED RADIOCEPHALIC ARTERIOVENOUS FISTULA;  Surgeon: Conrad Prospect, MD;  Location: [place];  Service: Vascular;  Laterality: Left;  . UMBILICAL HERNIA REPAIR N/A 08/29/2019   Procedure: UMBILICAL HERNIA REPAIR WITH MESH;  Surgeon: Erroll Luna, MD;  Location: [place];  Service: General;  Laterality: N/A;  . VEIN HARVEST Right 04/17/2015   Procedure: RIGHT GREATER [place] ;  Surgeon: Conrad Jalapa, MD;  Location: [place];  Service: Vascular;  Laterality: Right;    Allergies  Allergen Reactions  . Tape Rash and Other (See Comments)    NO Silk tape, please!!    Prior to Admission medications   Medication Sig Start Date End Date Taking? Authorizing Provider  citalopram (CELEXA) 40 MG tablet Take 20 mg by mouth 2 (two) times daily.  02/18/17  Yes [provider]  Cyanocobalamin (VITAMIN B-12 PO) Take 1 tablet by mouth daily.   Yes [provider]  ferrous sulfate 325 (65 FE) MG EC tablet Take 1 tablet (325 mg  total) by mouth 2 (two) times daily. Patient taking differently: Take 325 mg by mouth 2 (two) times daily. Taking it PRN 02/07/19  Yes Nita Sells, MD  lisdexamfetamine (VYVANSE) 20 MG capsule Take 20 mg by mouth 2 (two) times daily.    Yes [provider]  metoprolol succinate (TOPROL-XL) 25 MG 24 hr tablet Take 25 mg by mouth 2 (two) times daily.  08/16/17  Yes [provider]  mycophenolate (CELLCEPT) 250 MG capsule Take 250-500 mg by mouth See admin instructions. Take 500 mg by mouth in the morning and 250 mg in the evening   Yes [provider]  NIFEdipine (ADALAT CC) 30 MG 24 hr tablet Take 30 mg by mouth at bedtime. 09/12/19  Yes [provider]  pantoprazole (PROTONIX) 40 MG tablet Take 40 mg by mouth daily.   Yes [provider]  Pyridoxine HCl (VITAMIN B-6 PO) Take 1 tablet by mouth 3 (three) times a week.    Yes [provider]  rosuvastatin (CRESTOR) 5 MG tablet Take 1 tablet (5 mg total) by mouth at bedtime. 11/01/19  Yes Adrian Prows, MD  tacrolimus (PROGRAF) 1 MG capsule Take 3-4 mg by  mouth 2 (two) times daily. 4 mg in the morning and 3 mg in the evening   Yes [provider]  tamsulosin (FLOMAX) 0.4 MG CAPS capsule Take 0.4 mg by mouth daily.   Yes [provider]  zolpidem (AMBIEN) 10 MG tablet Take 5 mg by mouth at bedtime.    Yes [provider]  albuterol (PROVENTIL HFA;VENTOLIN HFA) 108 (90 Base) MCG/ACT inhaler Inhale 1-2 puffs into the lungs every 6 (six) hours as needed for wheezing or shortness of breath. 07/17/18   Rancour, Annie Main, MD  ketotifen (ZADITOR) 0.025 % ophthalmic solution Place 1 drop into both eyes 2 (two) times daily as needed (for seasonal allergies). Patient not taking: Reported on 11/01/2019    [provider]  tadalafil (CIALIS) 20 MG tablet Take 20 mg by mouth daily as needed for erectile dysfunction.    [provider]    Social History   Socioeconomic  History  . Marital status: Single    Spouse name: Not on file  . Number of children: 3  . Years of education: Not on file  . Highest education level: Not on file  Occupational History  . Occupation: Ship broker  . 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 Smoking status: Former Smoker    Packs/day: 0.50    Years: 34.00    Pack years: 17.00    Types: E-cigarettes    Quit date: 02/17/2003    Years since quitting: 16.8  . Smokeless tobacco: Former Systems developer  . Tobacco comment: vapor cigrarette  Vaping Use  . Vaping Use: Every day  Substance and Sexual Activity  . Alcohol use: Not Currently    Alcohol/week: 0.0 standard drinks  . Drug use: Not Currently    Types: Cocaine, Marijuana, Heroin, Other-see comments    Comment: pt states used TCH a few weeks ago/ 04/25/2013 "abused in the past ; stopped  11/13/2002.....states smokes marijuana again 2021  . Sexual activity: Not on file  Other Topics Concern  . Not on file  Social History Narrative   Patient reports he is divorced. 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   On disability otherwise, he is a vapor smoker, 3 caffeinated beverages daily   Social Determinants of Health   Financial Resource Strain:   . Difficulty of Paying Living Expenses:   Food Insecurity:   . Worried About Charity fundraiser in the Last Year:   . Arboriculturist in the Last Year:   Transportation Needs:   . Film/video editor (Medical):   Marland Kitchen Lack of Transportation (Non-Medical):   Physical Activity:   . Days of Exercise per Week:   . Minutes of Exercise per Session:   Stress:   . Feeling of Stress :   Social Connections:   . Frequency of Communication with Friends and Family:   . Frequency of Social Gatherings with Friends and Family:   . Attends Religious Services:   . Active Member of Clubs or Organizations:   . Attends Archivist Meetings:   Marland Kitchen Marital Status:   Intimate Partner Violence:   . Fear of Current or Ex-Partner:   .  Emotionally Abused:   Marland Kitchen Physically Abused:   . Sexually Abused:      Family History  Problem Relation Age of Onset  . Hypertension Father   . Other Father        amputation  . Hypertension Mother  ROS: [x]  Positive   [ ]  Negative   [ ]  All sytems reviewed and are negative  Cardiovascular: []  chest pain/pressure []  palpitations []  SOB lying flat []  DOE []  pain in legs while walking []  pain in legs at rest []  pain in legs at night []  non-healing ulcers []  hx of DVT []  swelling in legs  Pulmonary: []  productive cough []  asthma/wheezing []  home O2  Neurologic: []  weakness in []  arms []  legs []  numbness in []  arms []  legs []  hx of CVA []  mini stroke [] difficulty speaking or slurred speech []  temporary loss of vision in one eye []  dizziness  Hematologic: []  hx of cancer []  bleeding problems []  problems with blood clotting easily  Endocrine:   []  diabetes []  thyroid disease  GI []  vomiting blood []  blood in stool  GU: []  CKD/renal failure []  HD--[]  M/W/F or []  T/T/S []  burning with urination []  blood in urine  Psychiatric: []  anxiety []  depression  Musculoskeletal: []  arthritis []  joint pain  Integumentary: []  rashes []  ulcers  Constitutional: []  fever []  chills   Physical Examination  Vitals:   12/12/19 0943  BP: 132/76  Pulse: 74  Resp: 17  SpO2: 100%   Body mass index is 20.09 kg/m.  General:  WDWN in NAD Gait: Not observed HENT: WNL, normocephalic Pulmonary: normal non-labored breathing, without Rales, rhonchi,  wheezing Cardiac: regular, without  Murmurs, rubs or gallops Abdomen:  soft, NT/ND, no masses Vascular Exam/Pulses: Palpable femoral pulses bilaterarlly Extremities: without ischemic changes, without Gangrene , without cellulitis; without open wounds;  Musculoskeletal: no muscle wasting or atrophy  Neurologic: A&O X 3; Appropriate Affect ; SENSATION: normal; MOTOR FUNCTION:  moving all extremities equally. Speech  is fluent/normal  CBC    Component Value Date/Time   WBC 4.1 08/28/2019 1000   RBC 4.61 08/28/2019 1000   HGB 10.2 (L) 12/12/2019 1025   HGB 15.0 05/12/2018 1041   HGB 11.2 (L) 03/18/2017 0903   HCT 30.0 (L) 12/12/2019 1025   HCT 36.2 (L) 03/18/2017 0903   PLT 257 08/28/2019 1000   PLT 177 05/12/2018 1041   PLT 212 03/18/2017 0903   MCV 64.9 (L) 08/28/2019 1000   MCV 78.2 (L) 03/18/2017 0903   MCH 18.4 (L) 08/28/2019 1000   MCHC 28.4 (L) 08/28/2019 1000   RDW 21.2 (H) 08/28/2019 1000   RDW 17.1 (H) 03/18/2017 0903   LYMPHSABS 1.4 08/28/2019 1000   LYMPHSABS 0.7 (L) 03/18/2017 0903   MONOABS 0.0 (L) 08/28/2019 1000   MONOABS 0.4 03/18/2017 0903   EOSABS 0.2 08/28/2019 1000   EOSABS 0.1 03/18/2017 0903   BASOSABS 0.2 (H) 08/28/2019 1000   BASOSABS 0.0 03/18/2017 0903    BMET    Component Value Date/Time   NA 141 12/12/2019 1025   NA 139 03/18/2017 0903   K 3.9 12/12/2019 1025   K 4.1 03/18/2017 0903   CL 103 12/12/2019 1025   CO2 25 08/28/2019 1000   CO2 21 (L) 03/18/2017 0903   GLUCOSE 114 (H) 12/12/2019 1025   GLUCOSE 146 (H) 03/18/2017 0903   BUN 15 12/12/2019 1025   BUN 13.3 03/18/2017 0903   CREATININE 1.30 (H) 12/12/2019 1025   CREATININE 1.14 05/12/2018 1041   CREATININE 1.2 03/18/2017 0903   CALCIUM 9.6 08/28/2019 1000   CALCIUM 9.8 03/18/2017 0903   GFRNONAA 58 (L) 08/28/2019 1000   GFRNONAA >60 05/12/2018 1041   GFRNONAA 72 10/20/2015 1355   GFRAA >60 08/28/2019 1000   GFRAA >60  05/12/2018 1041   GFRAA 84 10/20/2015 1355    COAGS: Lab Results  Component Value Date   INR 1.1 02/04/2019   INR 1.03 10/22/2016   INR 1.03 04/16/2015     Non-Invasive Vascular Imaging:     Non-Invasive Vascular Imaging:    ABI's/TBI's on 06/04/2019: Right: ABI 0.89, TBI 0.60 Left: ABI .0.83, TBI 0.63  Arterial duplex 06/04/2019: Right lower extremity CFA- BK pop bypass with 50-74% stenosis in the inflow artery. Distal anastomosis with velocities > 300  cm/s Left lower extremity CFA- BLK pop bypass with 50-70% stenosis in the inflow artery. Inflow velocity of 251 cm/s   Aortoiliac duplex 06/04/2019: > 50% stenosis throughout the right CIA and the proximal EIA. Velocities are lower on to days study at 228 in the CIA compared to 270 . Velocities in the EIA have increased from prior study from 243 to 311  > 50 % stenosis in the distal left EIA. Velocities have increased from 169 on prior study to 354.  ASSESSMENT/PLAN: This is a 64 y.o. male with history of lifestyle limiting claudication and bilateral common iliac stents as well as bilateral common femoral to below-knee pop bypass with vein by Dr. Bridgett Larsson.  Presents with worsening left calf claudication and evidence of increasing iliac disease on duplex.  Discuss would delay this unless his symptoms are severe enough given his kidney transplant.  He wants to proceed and states he needs his leg evaluated and the claudication is disabling.  Risk of procedure discussed.  [name], MD [facility] [place] Office: [phone]

## 2019-12-12 NOTE — Progress Notes (Signed)
After the patient was rolled back for his procedure today in the Cath Lab he requested general anesthesia.  Discussed with him in detail that we offer moderate conscious sedation in the Cath Lab and we do not have capability of doing general anesthesia in this procedure area.  He wants his procedure canceled and cannot proceed.  I discussed that in the setting of claudication with patent lower extremity bypasses there is no urgency to this intervention.  I would recommend follow-up with me in the office with new lower extremity arterial duplexes and aortoiliac duplex given his studies are almost 45 months old before justifying going to the operating room and putting him under general anesthesia given he is likely high risk with all of his comorbidities.  [name], MD [facility] [place] Office: [place]

## 2019-12-24 DIAGNOSIS — N3001 Acute cystitis with hematuria: Secondary | ICD-10-CM | POA: Diagnosis not present

## 2019-12-24 DIAGNOSIS — I517 Cardiomegaly: Secondary | ICD-10-CM | POA: Diagnosis not present

## 2019-12-24 DIAGNOSIS — K219 Gastro-esophageal reflux disease without esophagitis: Secondary | ICD-10-CM | POA: Diagnosis not present

## 2019-12-24 DIAGNOSIS — E7800 Pure hypercholesterolemia, unspecified: Secondary | ICD-10-CM | POA: Diagnosis not present

## 2019-12-24 DIAGNOSIS — I10 Essential (primary) hypertension: Secondary | ICD-10-CM | POA: Diagnosis not present

## 2020-01-01 DIAGNOSIS — I10 Essential (primary) hypertension: Secondary | ICD-10-CM | POA: Diagnosis not present

## 2020-01-10 DIAGNOSIS — Z940 Kidney transplant status: Secondary | ICD-10-CM | POA: Diagnosis not present

## 2020-01-10 DIAGNOSIS — N319 Neuromuscular dysfunction of bladder, unspecified: Secondary | ICD-10-CM | POA: Diagnosis not present

## 2020-01-10 DIAGNOSIS — I129 Hypertensive chronic kidney disease with stage 1 through stage 4 chronic kidney disease, or unspecified chronic kidney disease: Secondary | ICD-10-CM | POA: Diagnosis not present

## 2020-01-10 DIAGNOSIS — D649 Anemia, unspecified: Secondary | ICD-10-CM | POA: Diagnosis not present

## 2020-01-10 DIAGNOSIS — E7800 Pure hypercholesterolemia, unspecified: Secondary | ICD-10-CM | POA: Diagnosis not present

## 2020-01-15 ENCOUNTER — Other Ambulatory Visit: Payer: Self-pay

## 2020-01-15 DIAGNOSIS — I70211 Atherosclerosis of native arteries of extremities with intermittent claudication, right leg: Secondary | ICD-10-CM

## 2020-01-15 DIAGNOSIS — I739 Peripheral vascular disease, unspecified: Secondary | ICD-10-CM

## 2020-01-15 DIAGNOSIS — I779 Disorder of arteries and arterioles, unspecified: Secondary | ICD-10-CM

## 2020-01-29 ENCOUNTER — Encounter (HOSPITAL_COMMUNITY): Payer: Medicare Other

## 2020-01-29 ENCOUNTER — Ambulatory Visit: Payer: Medicare Other | Admitting: Vascular Surgery

## 2020-01-31 DIAGNOSIS — I10 Essential (primary) hypertension: Secondary | ICD-10-CM | POA: Diagnosis not present

## 2020-02-12 ENCOUNTER — Emergency Department (HOSPITAL_COMMUNITY): Payer: Medicare Other

## 2020-02-12 ENCOUNTER — Encounter (HOSPITAL_COMMUNITY): Payer: Self-pay | Admitting: Emergency Medicine

## 2020-02-12 ENCOUNTER — Other Ambulatory Visit: Payer: Self-pay

## 2020-02-12 ENCOUNTER — Emergency Department (HOSPITAL_COMMUNITY)
Admission: EM | Admit: 2020-02-12 | Discharge: 2020-02-13 | Disposition: A | Discharge status: Home / Self Care | Payer: Medicare Other | Attending: Emergency Medicine | Admitting: Emergency Medicine

## 2020-02-12 DIAGNOSIS — Z79899 Other long term (current) drug therapy: Secondary | ICD-10-CM | POA: Insufficient documentation

## 2020-02-12 DIAGNOSIS — I120 Hypertensive chronic kidney disease with stage 5 chronic kidney disease or end stage renal disease: Secondary | ICD-10-CM | POA: Diagnosis not present

## 2020-02-12 DIAGNOSIS — N186 End stage renal disease: Secondary | ICD-10-CM | POA: Diagnosis not present

## 2020-02-12 DIAGNOSIS — Z87891 Personal history of nicotine dependence: Secondary | ICD-10-CM | POA: Insufficient documentation

## 2020-02-12 DIAGNOSIS — R5383 Other fatigue: Secondary | ICD-10-CM | POA: Diagnosis present

## 2020-02-12 DIAGNOSIS — U071 COVID-19: Secondary | ICD-10-CM | POA: Insufficient documentation

## 2020-02-12 DIAGNOSIS — R059 Cough, unspecified: Secondary | ICD-10-CM | POA: Diagnosis not present

## 2020-02-12 LAB — RESPIRATORY PANEL BY RT PCR (FLU A&B, COVID)
Influenza A by PCR: NEGATIVE
Influenza B by PCR: NEGATIVE
SARS Coronavirus 2 by RT PCR: POSITIVE — AB

## 2020-02-12 NOTE — Discharge Instructions (Signed)
As we discussed, your Covid test was positive.  Your chest x-ray is reassuring.  I have referred you to the monoclonal antibody infusion.  They will contact you regarding setting up an infusion.  Return the emergency department for any difficulty breathing, chest pain, vomiting or any other worsening concerning symptoms.

## 2020-02-12 NOTE — ED Notes (Signed)
ED Provider at bedside. 

## 2020-02-12 NOTE — ED Triage Notes (Signed)
Patient states 5 days ago he was exposed to someone with COVID. Patient states he has now lost his sense of taste and smell. Patient complaining of malaise and headache. Patient states nausea, no vomiting, and endorses sinus congestion. Patient is a kidney transplant patient.

## 2020-02-12 NOTE — ED Provider Notes (Signed)
[facility] DEPT Provider Note   CSN: [id number] Arrival date & time: 02/12/20  1950     History Chief Complaint  Patient presents with  . Fatigue  . Covid Exposure    Scott Chavez is a 64 y.o. male past history of ADHD, kidney transplant (on CellCept and Prograf), ESRD, GERD who presents for evaluation of concerns of Covid exposure.  He reports about 4 to 5 days ago, he was exposed to someone with Covid.  He states he has got his vaccines back in June.  He states that the next day, he started having fatigue, generalized body aches, headache, nausea.  He also reports he has had some congestion, and rhinorrhea and lost his taste.  He says he has had a cough that is productive of brown phlegm.  No blood.  He states he has been able to eat even though he feels nauseous.  He has not had any vomiting.  He states occasionally, he will have some mild shortness of breath.  He has had some subjective fever and chills but has not measured anything.  He denies any abdominal pain, chest pain.  The history is provided by the patient.       Past Medical History:  Diagnosis Date  . ADHD   . Anemia    pt. not sure, but reports he is taking Fe for one month now.  . Aorto-iliac disease ([place])   . Bladder dysfunction   . ESRD (end stage renal disease) [facility])    transplant- [facility]/2010[facility] , followed by Dr. Moshe Cipro   . GERD (gastroesophageal reflux disease)   . Hemodialysis patient [facility])    "on dialysis 1998-2010" 04/25/2013)  . Hepatitis C    has been treated with Harvoni  . History of renal failure   . Hypercholesteremia   . Hypertension   . Internal hemorrhoids    Colonoscopy 2010 and anoscopy 2015  . Peripheral vascular disease ([place])   . Pneumonia   . Self-catheterizes urinary bladder    pt. choice, but he reports that he desires to do this to avoid retention     Patient Active Problem List   Diagnosis Date Noted  . Acute upper GI bleeding  02/05/2019  . GI bleeding 02/04/2019  . AKI (acute kidney injury) ([place]) 02/04/2019  . Iron deficiency anemia 04/06/2017  . Closed fracture of left malar bone ([place]) 10/11/2016  . Chronic neck pain 03/15/2016  . Chronic low back pain without sciatica 03/15/2016  . Nonallopathic lesion of lumbosacral region 03/15/2016  . Nonallopathic lesion of sacral region 03/15/2016  . Nonallopathic lesion of thoracic region 03/15/2016  . Liver fibrosis 12/18/2015  . Arteriovenous fistula thrombosis ([place]) 10/16/2015  . Chronic hepatitis C without hepatic coma ([place]) 09/03/2015  . Atherosclerosis of native arteries of extremities with intermittent claudication, right leg ([place]) 04/17/2015  . Pain in joint, lower leg 09/13/2014  . Hemorrhoids, internal, with bleeding 01/29/2014  . Pain in limb 08/17/2013  . Peripheral vascular disease, unspecified ([place]) 06/08/2013  . Redness of skin-Calf to foot LEFT 05/25/2013  . Tenderness in limb-Calf to foot LEFT 05/25/2013  . Swelling of limb-Calf to foot LEFT 05/25/2013  . Postoperative pain 05/02/2013  . PAD (peripheral artery disease) ([place]) 04/24/2013  . Preop cardiovascular exam 03/27/2013  . Essential hypertension 03/27/2013  . Hyperlipidemia 03/27/2013  . Atherosclerosis of native arteries of extremity with intermittent claudication ([place]) 02/16/2013  . History of renal transplant 02/16/2013  . Aorto-iliac disease ([place]) 10/29/2011  .  Chest pain 10/29/2011  . Neurogenic dysfunction of the urinary bladder April 24, 2011  . Deceased-donor kidney transplant recipient 04/24/11  . Immunosuppression ([place]) 04/24/11    Past Surgical History:  Procedure Laterality Date  . ABDOMINAL AORTAGRAM N/A 03/09/2013   Procedure: ABDOMINAL Maxcine Ham;  Surgeon: Elam Dutch, MD;  Location: [facility] CATH LAB;  Service: Cardiovascular;  Laterality: N/A;  . AV FISTULA PLACEMENT Left 1998   "removed 2011" (04/25/2013)  . AV FISTULA REPAIR Left    removal with partial vein removed   . COLONOSCOPY    . ENDARTERECTOMY FEMORAL Right 04/17/2015   Procedure: ENDARTERECTOMY RIGHT ILIOFEMORAL WITH PROFUNDOPLASTY;  Surgeon: Conrad Rabun, MD;  Location: [facility];  Service: Vascular;  Laterality: Right;  . ENDARTERECTOMY FEMORAL Right 10/25/2016   Procedure: RIGHT ILIOFEMORAL ENDARTERECTOMY WITH BOVINE PERICARDIAL PATCH ANGIOPLASTY;  Surgeon: Conrad Leelanau, MD;  Location: [facility];  Service: Vascular;  Laterality: Right;  . ESOPHAGOGASTRODUODENOSCOPY (EGD) WITH PROPOFOL N/A 02/05/2019   Procedure: ESOPHAGOGASTRODUODENOSCOPY (EGD) WITH PROPOFOL;  Surgeon: Clarene Essex, MD;  Location: [facility] ENDOSCOPY;  Service: Endoscopy;  Laterality: N/A;  . FEMORAL-POPLITEAL BYPASS GRAFT Left 04/24/2013   Procedure: BYPASS GRAFT COMMON FEMORALARTERY-BELOW KNEE POPLITEAL ARTERY WITH GREATER SAPHENOUS VEIN;  Surgeon: Conrad Hayden, MD;  Location: [facility];  Service: Vascular;  Laterality: Left;  . FEMORAL-POPLITEAL BYPASS GRAFT Right 04/17/2015   Procedure: BYPASS GRAFT RIGHT FEMORALTO BELOW KNEE POPLITEAL ARTERY USING RIGHT NON REVERSED GREATER SAPPHENOUS VEIN;  Surgeon: Conrad Darwin, MD;  Location: [facility];  Service: Vascular;  Laterality: Right;  . HERNIA REPAIR  08/2019  . INGUINAL HERNIA REPAIR Left 08/29/2019   Procedure: LEFT INGUINAL HERNIA REPAIR WITH MESH;  Surgeon: Erroll Luna, MD;  Location: [place];  Service: General;  Laterality: Left;  . INTRAOPERATIVE ARTERIOGRAM Left 04/24/2013   Procedure: INTRA OPERATIVE ARTERIOGRAM;  Surgeon: Conrad [place] Lake, MD;  Location: [facility];  Service: Vascular;  Laterality: Left;  . KIDNEY TRANSPLANT    . LOWER EXTREMITY ANGIOGRAM Bilateral 03/09/2013   Procedure: LOWER EXTREMITY ANGIOGRAM;  Surgeon: Elam Dutch, MD;  Location: [facility] CATH LAB;  Service: Cardiovascular;  Laterality: Bilateral;  . LOWER EXTREMITY ANGIOGRAM Right 10/25/2013   Procedure: LOWER EXTREMITY ANGIOGRAM;  Surgeon: Conrad Rockholds, MD;  Location[facility] C[facility] LAB;  Service: Cardiovascular;   Laterality: Right;  . LOWER EXTREMITY ANGIOGRAM Right 12/13/2013   Procedure: LOWER EXTREMITY ANGIOGRAM;  Surgeon: Conrad Hildebran, MD;  Location: [facility] CATH LAB;  Service: Cardiovascular;  Laterality: Right;  . NEPHRECTOMY RECIPIENT  2010  . NEPHRECTOMY TRANSPLANTED ORGAN    . PATCH ANGIOPLASTY Right 04/17/2015   Procedure: Rueben Bash PATCH ANGIOPLASTY, RIGHT ILEOFEMORAL;  Surgeon: Conrad Schriever, MD;  Location: [facility];  Service: Vascular;  Laterality: Right;  . PERIPHERAL VASCULAR CATHETERIZATION N/A 04/10/2015   Procedure: Abdominal Aortogram;  Surgeon: Conrad Whitehawk, MD;  Location: [facility] CV LAB;  Service: Cardiovascular;  Laterality: N/A;  . PERIPHERAL VASCULAR CATHETERIZATION N/A 05/10/2016   Procedure: Abdominal Aortogram w/Lower Extremity;  Surgeon: Conrad Clarks, MD;  Location: [facility] CV LAB;  Service: Cardiovascular;  Laterality: N/A;  . s/p cadaveric renal transplant  December 2010   [place] Left 09/24/2015   Procedure: EXCISION OF THROMBOSED RADIOCEPHALIC ARTERIOVENOUS FISTULA;  Surgeon: Conrad Hillsboro, MD;  Location: [place];  Service: Vascular;  Laterality: Left;  . UMBILICAL HERNIA REPAIR N/A 08/29/2019   Procedure: UMBILICAL HERNIA REPAIR WITH MESH;  Surgeon: Erroll Luna, MD;  Location: [facility];  Service: General;  Laterality: N/A;  . VEIN HARVEST Right 04/17/2015   Procedure: RIGHT GREATER [place] ;  Surgeon: Conrad Sycamore, MD;  Location: [facility];  Service: Vascular;  Laterality: Right;       Family History  Problem Relation Age of Onset  . Hypertension Father   . Other Father        amputation  . Hypertension Mother     Social History   Tobacco Use  . Smoking status: Former Smoker    Packs/day: 0.50    Years: 34.00    Pack years: 17.00    Types: E-cigarettes    Quit date: 02/17/2003    Years since quitting: 17.0  . Smokeless tobacco: Former Systems developer  . Tobacco comment: vapor cigrarette  Vaping Use  .  Vaping Use: Every day  Substance Use Topics  . Alcohol use: Not Currently    Alcohol/week: 0.0 standard drinks  . Drug use: Not Currently    Types: Cocaine, Marijuana, Heroin, Other-see comments    Comment: pt states used TCH a few weeks ago/ 04/25/2013 "abused in the past ; stopped  11/13/2002.....states smokes marijuana again 2021    Home Medications Prior to Admission medications   Medication Sig Start Date End Date Taking? Authorizing Provider  albuterol (PROVENTIL HFA;VENTOLIN HFA) 108 (90 Base) MCG/ACT inhaler Inhale 1-2 puffs into the lungs every 6 (six) hours as needed for wheezing or shortness of breath. 07/17/18  Yes Rancour, Annie Main, MD  citalopram (CELEXA) 40 MG tablet Take 20 mg by mouth 2 (two) times daily.  02/18/17  Yes [provider]  Cyanocobalamin (VITAMIN B-12 PO) Take 1 tablet by mouth daily.   Yes [provider]  ferrous sulfate 325 (65 FE) MG EC tablet Take 1 tablet (325 mg total) by mouth 2 (two) times daily. Patient taking differently: Take 325 mg by mouth 2 (two) times daily. Taking it PRN 02/07/19  Yes Nita Sells, MD  lisdexamfetamine (VYVANSE) 20 MG capsule Take 20 mg by mouth 2 (two) times daily.    Yes [provider]  metoprolol succinate (TOPROL-XL) 25 MG 24 hr tablet Take 25 mg by mouth 2 (two) times daily.  08/16/17  Yes [provider]  mycophenolate (CELLCEPT) 250 MG capsule Take 250-500 mg by mouth See admin instructions. Take 500 mg by mouth in the morning and 250 mg in the evening   Yes [provider]  NIFEdipine (ADALAT CC) 30 MG 24 hr tablet Take 30 mg by mouth at bedtime. 09/12/19  Yes [provider]  pantoprazole (PROTONIX) 40 MG tablet Take 40 mg by mouth daily.   Yes [provider]  Pyridoxine HCl (VITAMIN B-6 PO) Take 1 tablet by mouth 3 (three) times a week.    Yes [provider]  rosuvastatin (CRESTOR) 5 MG tablet Take 1 tablet (5 mg total) by mouth at bedtime.  11/01/19  Yes Adrian Prows, MD  tacrolimus (PROGRAF) 1 MG capsule Take 3-4 mg by mouth 2 (two) times daily. 4 mg in the morning and 3 mg in the evening   Yes [provider]  tadalafil (CIALIS) 20 MG tablet Take 20 mg by mouth daily as needed for erectile dysfunction.   Yes [provider]  tamsulosin (FLOMAX) 0.4 MG CAPS capsule Take 0.4 mg by mouth daily.   Yes [provider]  zolpidem (AMBIEN) 10 MG tablet Take 5 mg by mouth at bedtime.    Yes [provider]  ketotifen (ZADITOR) 0.025 % ophthalmic solution Place 1 drop into both eyes 2 (two) times daily as needed (for seasonal allergies). Patient not taking: Reported on 11/01/2019    [provider]    Allergies    Tape  Review of Systems   Review of Systems  Constitutional: Positive for activity change, appetite change, chills, fatigue and fever.  Respiratory: Positive for cough and shortness of breath.   Cardiovascular: Negative for chest pain.  Gastrointestinal: Positive for nausea. Negative for abdominal pain and vomiting.  Genitourinary: Negative for dysuria and hematuria.  Musculoskeletal: Positive for myalgias.  Neurological: Negative for headaches.  All other systems reviewed and are negative.   Physical Exam Updated Vital Signs BP 130/68 (BP Location: Right Arm)   Pulse 62   Temp 98.2 F (36.8 C) (Oral)   Resp 18   Ht 5\' 10"  (1.778 m)   Wt 63.5 kg   SpO2 100%   BMI 20.09 kg/m   Physical Exam Vitals and nursing note reviewed.  Constitutional:      Appearance: Normal appearance. He is well-developed.     Comments: Resting comfortably on bed with 2 large bags of potato chips opened at his bedside.  HENT:     Head: Normocephalic and atraumatic.  Eyes:     General: Lids are normal.     Conjunctiva/sclera: Conjunctivae normal.     Pupils: Pupils are equal, round, and reactive to light.  Cardiovascular:     Rate and Rhythm: Normal rate and regular rhythm.     Pulses:  Normal pulses.     Heart sounds: Normal heart sounds. No murmur heard.  No friction rub. No gallop.   Pulmonary:     Effort: Pulmonary effort is normal.     Breath sounds: Normal breath sounds.     Comments: Lungs clear to auscultation bilaterally.  Symmetric chest rise.  No wheezing, rales, rhonchi. Abdominal:     Palpations: Abdomen is soft. Abdomen is not rigid.     Tenderness: There is no abdominal tenderness. There is no guarding.     Comments: Abdomen is soft, non-distended, non-tender. No rigidity, No guarding. No peritoneal signs.  Musculoskeletal:        General: Normal range of motion.     Cervical back: Full passive range of motion without pain.  Skin:    General: Skin is warm and dry.     Capillary Refill: Capillary refill takes less than 2 seconds.  Neurological:     Mental Status: He is alert and oriented to person, place, and time.  Psychiatric:        Speech: Speech normal.     ED Results / Procedures / Treatments   Labs (all labs ordered are listed, but only abnormal results are displayed) Labs Reviewed  RESPIRATORY PANEL BY RT PCR (FLU A&B, COVID) - Abnormal; Notable for the following components:      Result Value   SARS Coronavirus 2 by RT PCR POSITIVE (*)    All other components within normal limits    EKG None  Radiology DG Chest Portable 1 View  Result Date: 02/12/2020 CLINICAL DATA:  COVID-19 exposure.  Cough. EXAM: PORTABLE CHEST 1 VIEW COMPARISON:  02/04/2019 FINDINGS: The heart size and mediastinal contours are within normal limits. Both lungs are clear. The visualized skeletal structures are unremarkable. IMPRESSION: No active disease. Electronically Signed   By: Ulyses Jarred M.D.   On: 02/12/2020 23:27    Procedures Procedures (including critical care  time)  Medications Ordered in ED Medications - No data to display  ED Course  I have reviewed the triage vital signs and the nursing notes.  Pertinent labs & imaging results that were  available during my care of the patient were reviewed by me and considered in my medical decision making (see chart for details).    MDM Rules/Calculators/A&P                          64 year old male past medical history of kidney transplant who presents for evaluation of concern for Covid exposure.  States he has been vaccinated but reports over the last couple days, he has had some fatigue, subjective fever and chills, nausea, sinus congestion, cough.  On initial ED arrival, he is afebrile, nontoxic-appearing.  Vital signs are stable.  On exam, he is resting comfortably on the bed with 2 large stacks of potato chips next to him. He has no evidence of respiratory distress.  Lungs clear to auscultation.  Abdomen exam is benign.  Consider COVID-19.  We will plan to check Covid test.  Patient is Covid positive here in the ED.  Chest x-ray is unremarkable.  This time, patient is hemodynamically stable.  He has no signs of hypoxia.  He is resting comfortably on bed.  Given his immunocompromise state, will plan to refer him to monoclonal antibody infusion clinic.  I discussed this with patient and he is in agreement to plan.  Encouraged at home supportive care measures. At this time, patient exhibits no emergent life-threatening condition that require further evaluation in ED. Patient had ample opportunity for questions and discussion. All patient's questions were answered with full understanding. Strict return precautions discussed. Patient expresses understanding and agreement to plan.   ARMSTEAD HEILAND was evaluated in Emergency Department on 02/13/2020 for the symptoms described in the history of present illness. He was evaluated in the context of the global COVID-19 pandemic, which necessitated consideration that the patient might be at risk for infection with the SARS-CoV-2 virus that causes COVID-19. Institutional protocols and algorithms that pertain to the evaluation of patients at risk for COVID-19  are in a state of rapid change based on information released by regulatory bodies including the CDC and federal and state organizations. These policies and algorithms were followed during the patient's care in the ED.  Portions of this note were generated with L[employer]ist. Dictation errors may occur despite best attempts at proofreading.   Final Clinical Impression(s) / ED Diagnoses Final diagnoses:  [id number]    Rx / DC Orders ED Discharge Orders    None       Volanda Napoleon, PA-C 02/13/20 0039    Orpah Greek, MD 02/13/20 [phone]

## 2020-02-13 ENCOUNTER — Other Ambulatory Visit: Payer: Self-pay | Admitting: Nurse Practitioner

## 2020-02-13 ENCOUNTER — Encounter: Payer: Self-pay | Admitting: Nurse Practitioner

## 2020-02-13 DIAGNOSIS — U071 COVID-19: Secondary | ICD-10-CM

## 2020-02-13 NOTE — ED Notes (Signed)
Patient ambulatory to the lobby with a steady gait and belongings.

## 2020-02-13 NOTE — Progress Notes (Signed)
I connected by phone with patient to discuss the potential use of a new treatment for mild to moderate COVID-19 viral infection in non-hospitalized patients.   This patient is a that meets the FDA criteria for Emergency Use Authorization of COVID monoclonal antibody casirivimab/imdevimab. 1. Has a (+) direct SARS-CoV-2 viral test result 2. Has mild or moderate COVID-19  3. Is NOT hospitalized due to COVID-19 4. Is within 10 days of symptom onset 5. Has at least one of the high risk factor(s) for progression to severe COVID-19 and/or hospitalization as defined in EUA. Specific high risk criteria : Immunosuppressed due to kidney transplant     I have spoken and communicated the following to the patient or parent/caregiver regarding COVID monoclonal antibody treatment:   1. FDA has authorized the emergency use for the treatment of high risk post-exposure prophylaxis for COVID19.    2. The significant known and potential risks and benefits of COVID monoclonal antibody, and the extent to which such potential risks and benefits are unknown.   3. Information on available alternative treatments and the risks and benefits of those alternatives, including clinical trials.   4. Patients treated with COVID monoclonal antibody should continue to self-isolate and use infection control measures (e.g., wear mask, isolate, social distance, avoid sharing personal items, clean and disinfect "high touch" surfaces, and frequent handwashing) according to CDC guidelines.    5. The patient or parent/caregiver has the option to accept or refuse COVID monoclonal antibody treatment.   After reviewing this information with the patient, The patient agreed to proceed with receiving casirivimab\imdevimab infusion and will be provided a copy of the Fact sheet prior to receiving the infusion. Mariana Kaufman, NP

## 2020-02-14 ENCOUNTER — Ambulatory Visit (HOSPITAL_COMMUNITY)
Admission: RE | Admit: 2020-02-14 | Discharge: 2020-02-14 | Disposition: A | Discharge status: Home / Self Care | Payer: Medicare Other | Source: Ambulatory Visit | Attending: Pulmonary Disease | Admitting: Pulmonary Disease

## 2020-02-14 DIAGNOSIS — U071 COVID-19: Secondary | ICD-10-CM

## 2020-02-14 DIAGNOSIS — Z23 Encounter for immunization: Secondary | ICD-10-CM | POA: Diagnosis not present

## 2020-02-14 MED ORDER — DIPHENHYDRAMINE HCL 50 MG/ML IJ SOLN: 50.0000 mg | Freq: Once | INTRAMUSCULAR | Status: DC | PRN

## 2020-02-14 MED ORDER — FAMOTIDINE IN NACL 20-0.9 MG/50ML-% IV SOLN: 20.0000 mg | Freq: Once | INTRAVENOUS | Status: DC | PRN

## 2020-02-14 MED ORDER — SODIUM CHLORIDE 0.9 % IV SOLN: Freq: Once | INTRAVENOUS | Status: AC

## 2020-02-14 MED ORDER — SODIUM CHLORIDE 0.9 % IV SOLN: INTRAVENOUS | Status: DC | PRN

## 2020-02-14 MED ORDER — EPINEPHRINE 0.3 MG/0.3ML IJ SOAJ: 0.3000 mg | Freq: Once | INTRAMUSCULAR | Status: DC | PRN

## 2020-02-14 MED ORDER — ALBUTEROL SULFATE HFA 108 (90 BASE) MCG/ACT IN AERS: 2.0000 | INHALATION_SPRAY | Freq: Once | RESPIRATORY_TRACT | Status: DC | PRN

## 2020-02-14 MED ORDER — METHYLPREDNISOLONE SODIUM SUCC 125 MG IJ SOLR: 125.0000 mg | Freq: Once | INTRAMUSCULAR | Status: DC | PRN

## 2020-02-14 NOTE — Progress Notes (Signed)
  Diagnosis: COVID-19  Physician: Dr. Joya Gaskins  Procedure: Covid Infusion Clinic Med: bamlanivimab\etesevimab infusion - Provided patient with bamlanimivab\etesevimab fact sheet for patients, parents and caregivers prior to infusion.  Complications: No immediate complications noted.  Discharge: Discharged home   Tia Masker 02/14/2020

## 2020-02-14 NOTE — Discharge Instructions (Signed)

## 2020-02-22 ENCOUNTER — Encounter (HOSPITAL_COMMUNITY): Payer: Self-pay

## 2020-02-22 ENCOUNTER — Emergency Department (HOSPITAL_COMMUNITY)
Admission: EM | Admit: 2020-02-22 | Discharge: 2020-02-22 | Disposition: A | Discharge status: Home / Self Care | Payer: Medicare Other | Attending: Emergency Medicine | Admitting: Emergency Medicine

## 2020-02-22 ENCOUNTER — Other Ambulatory Visit: Payer: Self-pay

## 2020-02-22 DIAGNOSIS — U071 COVID-19: Secondary | ICD-10-CM | POA: Insufficient documentation

## 2020-02-22 DIAGNOSIS — I120 Hypertensive chronic kidney disease with stage 5 chronic kidney disease or end stage renal disease: Secondary | ICD-10-CM | POA: Diagnosis not present

## 2020-02-22 DIAGNOSIS — Z87891 Personal history of nicotine dependence: Secondary | ICD-10-CM | POA: Insufficient documentation

## 2020-02-22 DIAGNOSIS — N186 End stage renal disease: Secondary | ICD-10-CM | POA: Diagnosis not present

## 2020-02-22 DIAGNOSIS — Z79899 Other long term (current) drug therapy: Secondary | ICD-10-CM | POA: Diagnosis not present

## 2020-02-22 NOTE — ED Provider Notes (Signed)
[facility] DEPT Provider Note   CSN: [id number] Arrival date & time: 02/22/20  0456     History No chief complaint on file.   Scott Chavez is a 64 y.o. male presenting for evaluation after Covid infection.  Patient states he developed symptoms of Covid 10 to 14 days ago.  He was tested positive.  He has been around the same people who he thinks infected him initially, and he is worried that he could have been infected again.  He did receive the Mab treatment during his infection.  He has been fever free and asymptomatic for the past several days.  He is feeling well at this time.  He is requesting a retest to know if he has been reinfected.   HPI     Past Medical History:  Diagnosis Date   ADHD    Anemia    pt. not sure, but reports he is taking Fe for one month now.   Aorto-iliac disease ([place])    Bladder dysfunction    ESRD (end stage renal disease) ([place])    trans[facility]- [facility] , followed by Dr. Moshe Cipro    GERD (gastroesophageal reflux disease)    Hemodialysis patient [facility])    "on dialysis 1998-2010" 04/25/2013)   Hepatitis C    has been treated with Harvoni   History of renal failure    Hypercholesteremia    Hypertension    Internal hemorrhoids    Colonoscopy 2010 and anoscopy 2015   Peripheral vascular disease ([place])    Pneumonia    Self-catheterizes urinary bladder    pt. choice, but he reports that he desires to do this to avoid retention     Patient Active Problem List   Diagnosis Date Noted   Acute upper GI bleeding 02/05/2019   GI bleeding 02/04/2019   AKI (acute kidney injury) ([place]) 02/04/2019   Iron deficiency anemia 04/06/2017   Closed fracture of left malar bone ([place]) 10/11/2016   Chronic neck pain 03/15/2016   Chronic low back pain without sciatica 03/15/2016   Nonallopathic lesion of lumbosacral region 03/15/2016   Nonallopathic lesion of sacral region 03/15/2016    Nonallopathic lesion of thoracic region 03/15/2016   Liver fibrosis 12/18/2015   Arteriovenous fistula thrombosis ([place]) 10/16/2015   Chronic hepatitis C without hepatic coma ([place]) 09/03/2015   Atherosclerosis of native arteries of extremities with intermittent claudication, right leg ([place]) 04/17/2015   Pain in joint, lower leg 09/13/2014   Hemorrhoids, internal, with bleeding 01/29/2014   Pain in limb 08/17/2013   Peripheral vascular disease, unspecified ([place]) 06/08/2013   Redness of skin-Calf to foot LEFT 05/25/2013   Tenderness in limb-Calf to foot LEFT 05/25/2013   Swelling of limb-Calf to foot LEFT 05/25/2013   Postoperative pain 05/02/2013   PAD (peripheral artery disease) ([place]) 04/24/2013   Preop cardiovascular exam 03/27/2013   Essential hypertension 03/27/2013   Hyperlipidemia 03/27/2013   Atherosclerosis of native arteries of extremity with intermittent claudication ([place]) 02/16/2013   History of renal transplant 02/16/2013   Aorto-iliac disease ([place]) 10/29/2011   Chest pain 10/29/2011   Neurogenic dysfunction of the urinary bladder 05-02-11   Deceased-donor kidney transplant recipient 2011-05-02   Immunosuppression ([place]) 05/02/2011    Past Surgical History:  Procedure Laterality Date   ABDOMINAL AORTAGRAM N/A 03/09/2013   Procedure: ABDOMINAL Maxcine Ham;  Surgeon: Elam Dutch, MD;  Location: [facility] CATH LAB;  Service: Cardiovascular;  Laterality: N/A;   AV FISTULA PLACEMENT Left 1998   "removed  2011" (04/25/2013)   AV FISTULA REPAIR Left    removal with partial vein removed   COLONOSCOPY     ENDARTERECTOMY FEMORAL Right 04/17/2015   Procedure: ENDARTERECTOMY RIGHT ILIOFEMORAL WITH PROFUNDOPLASTY;  Surgeon: Conrad Morton, MD;  Location: [facility] [place];  Service: Vascular;  Laterality: Right;   ENDARTERECTOMY FEMORAL Right 10/25/2016   Procedure: RIGHT ILIOFEMORAL ENDARTERECTOMY WITH BOVINE PERICARDIAL PATCH ANGIOPLASTY;  Surgeon: Conrad [place], MD;   Location: [place];  Service: Vascular;  Laterality: Right;   ESOPHAGOGASTRODUODENOSCOPY (EGD) WITH PROPOFOL N/A 02/05/2019   Procedure: ESOPHAGOGASTRODUODENOSCOPY (EGD) WITH PROPOFOL;  Surgeon: Clarene Essex, MD;  Location: [facility] ENDOSCOPY;  Service: Endoscopy;  Laterality: N/A;   FEMORAL-POPLITEAL BYPASS GRAFT Left 04/24/2013   Procedure: BYPASS GRAFT COMMON FEMORALARTERY-BELOW KNEE POPLITEAL ARTERY WITH GREATER SAPHENOUS VEIN;  Surgeon: Conrad [place], MD;  Location: [place];  Service: Vascular;  Laterality: Left;   FEMORAL-POPLITEAL BYPASS GRAFT Right 04/17/2015   Procedure: BYPASS GRAFT RIGHT FEMORALTO BELOW KNEE POPLITEAL ARTERY USING RIGHT NON REVERSED GREATER [place];  Surgeon: Conrad [place], MD;  Location: [place];  Service: Vascular;  Laterality: Right;   HERNIA REPAIR  08/2019   INGUINAL HERNIA REPAIR Left 08/29/2019   Procedure: LEFT INGUINAL HERNIA REPAIR WITH MESH;  Surgeon: Erroll Luna, MD;  Location: [place];  Service: General;  Laterality: Left;   INTRAOPERATIVE ARTERIOGRAM Left 04/24/2013   Procedure: INTRA OPERATIVE ARTERIOGRAM;  Surgeon: Conrad [place], MD;  Location: [place];  Service: Vascular;  Laterality: Left;   KIDNEY TRANSPLANT     LOWER EXTREMITY ANGIOGRAM Bilateral 03/09/2013   Procedure: LOWER EXTREMITY ANGIOGRAM;  Surgeon: Elam Dutch, MD;  Location: [facility] CATH LAB;  Service: Cardiovascular;  Laterality: Bilateral;   LOWER EXTREMITY ANGIOGRAM Right 10/25/2013   Procedure: LOWER EXTREMITY ANGIOGRAM;  Surgeon: Conrad [place], MD;  Location: [facility] CATH LAB;  Service: Cardiovascular;  Laterality: Right;   LOWER EXTREMITY ANGIOGRAM Right 12/13/2013   Procedure: LOWER EXTREMITY ANGIOGRAM;  Surgeon: Conrad [place], MD;  Location: [facility] CATH LAB;  Service: Cardiovascular;  Laterality: Right;   NEPHRECTOMY RECIPIENT  2010   NEPHRECTOMY TRANSPLANTED ORGAN     PATCH ANGIOPLASTY Right 04/17/2015   Procedure: Rueben Bash PATCH ANGIOPLASTY, RIGHT ILEOFEMORAL;  Surgeon:  Conrad [place], MD;  Location: [place];  Service: Vascular;  Laterality: Right;   PERIPHERAL VASCULAR CATHETERIZATION N/A 04/10/2015   Procedure: Abdominal Aortogram;  Surgeon: Conrad Christmas, MD;  Location: [place] CV LAB;  Service: Cardiovascular;  Laterality: N/A;   PERIPHERAL VASCULAR CATHETERIZATION N/A 05/10/2016   Procedure: Abdominal Aortogram w/Lower Extremity;  Surgeon: Conrad [place], MD;  Location: [place] CV LAB;  Service: Cardiovascular;  Laterality: N/A;   s/p cadaveric renal transplant  December 2010   [place] W/ EMBOLECTOMY Left 09/24/2015   Procedure: EXCISION OF THROMBOSED RADIOCEPHALIC ARTERIOVENOUS FISTULA;  Surgeon: Conrad [place]dwell, MD;  Location: [place];  Service: Vascular;  Laterality: Left;   UMBILICAL HERNIA REPAIR N/A 08/29/2019   Procedure: UMBILICAL HERNIA REPAIR WITH MESH;  Surgeon: Erroll Luna, MD;  Location: [place];  Service: General;  Laterality: N/A;   VEIN HARVEST Right 04/17/2015   Procedure: RIGHT GREATER [place] ;  Surgeon: Conrad [place], MD;  Location: [place];  Service: Vascular;  Laterality: Right;       Family History  Problem Relation Age of Onset   Hypertension Father    Other Father  amputation   Hypertension Mother     Social History   Tobacco Use   Smoking status: Former Smoker    Packs/day: 0.50    Years: 34.00    Pack years: 17.00    Types: E-cigarettes    Quit date: 02/17/2003    Years since quitting: 17.0   Smokeless tobacco: Former Systems developer   Tobacco comment: vapor Scientist, water quality Use: Every day  Substance Use Topics   Alcohol use: Not Currently    Alcohol/week: 0.0 standard drinks   Drug use: Not Currently    Types: Cocaine, Marijuana, Heroin, Other-see comments    Comment: pt states used TCH a few weeks ago/ 04/25/2013 "abused in the past ; stopped  11/13/2002.....states smokes marijuana again 2021    Home Medications Prior to Admission  medications   Medication Sig Start Date End Date Taking? Authorizing Provider  albuterol (PROVENTIL HFA;VENTOLIN HFA) 108 (90 Base) MCG/ACT inhaler Inhale 1-2 puffs into the lungs every 6 (six) hours as needed for wheezing or shortness of breath. 07/17/18   Rancour, Annie Main, MD  citalopram (CELEXA) 40 MG tablet Take 20 mg by mouth 2 (two) times daily.  02/18/17   [provider]  Cyanocobalamin (VITAMIN B-12 PO) Take 1 tablet by mouth daily.    [provider]  ferrous sulfate 325 (65 FE) MG EC tablet Take 1 tablet (325 mg total) by mouth 2 (two) times daily. Patient taking differently: Take 325 mg by mouth 2 (two) times daily. Taking it PRN 02/07/19   Nita Sells, MD  ketotifen (ZADITOR) 0.025 % ophthalmic solution Place 1 drop into both eyes 2 (two) times daily as needed (for seasonal allergies). Patient not taking: Reported on 11/01/2019    [provider]  lisdexamfetamine (VYVANSE) 20 MG capsule Take 20 mg by mouth 2 (two) times daily.     [provider]  metoprolol succinate (TOPROL-XL) 25 MG 24 hr tablet Take 25 mg by mouth 2 (two) times daily.  08/16/17   [provider]  mycophenolate (CELLCEPT) 250 MG capsule Take 250-500 mg by mouth See admin instructions. Take 500 mg by mouth in the morning and 250 mg in the evening    [provider]  NIFEdipine (ADALAT CC) 30 MG 24 hr tablet Take 30 mg by mouth at bedtime. 09/12/19   [provider]  pantoprazole (PROTONIX) 40 MG tablet Take 40 mg by mouth daily.    [provider]  Pyridoxine HCl (VITAMIN B-6 PO) Take 1 tablet by mouth 3 (three) times a week.     [provider]  rosuvastatin (CRESTOR) 5 MG tablet Take 1 tablet (5 mg total) by mouth at bedtime. 11/01/19   Adrian Prows, MD  tacrolimus (PROGRAF) 1 MG capsule Take 3-4 mg by mouth 2 (two) times daily. 4 mg in the morning and 3 mg in the evening    [provider]  tadalafil (CIALIS) 20 MG tablet Take  20 mg by mouth daily as needed for erectile dysfunction.    [provider]  tamsulosin (FLOMAX) 0.4 MG CAPS capsule Take 0.4 mg by mouth daily.    [provider]  zolpidem (AMBIEN) 10 MG tablet Take 5 mg by mouth at bedtime.     [provider]    Allergies    Tape  Review of Systems   Review of Systems  Constitutional: Negative for fever.  Respiratory: Negative for cough and shortness of breath.   Cardiovascular: Negative for  chest pain.    Physical Exam Updated Vital Signs BP 134/87 (BP Location: Right Arm)    Pulse 64    Resp 16    SpO2 100%   Physical Exam Vitals and nursing note reviewed.  Constitutional:      General: He is not in acute distress.    Appearance: He is well-developed.     Comments: Patient is well-appearing  HENT:     Head: Normocephalic and atraumatic.  Cardiovascular:     Rate and Rhythm: Normal rate and regular rhythm.     Pulses: Normal pulses.  Pulmonary:     Effort: Pulmonary effort is normal.     Breath sounds: Normal breath sounds.     Comments: Clear lung sounds in all fields Abdominal:     General: There is no distension.  Musculoskeletal:        General: Normal range of motion.     Cervical back: Normal range of motion.  Skin:    General: Skin is warm.     Findings: No rash.  Neurological:     Mental Status: He is alert and oriented to person, place, and time.     ED Results / Procedures / Treatments   Labs (all labs ordered are listed, but only abnormal results are displayed) Labs Reviewed - No data to display  EKG None  Radiology No results found.  Procedures Procedures (including critical care time)  Medications Ordered in ED Medications - No data to display  ED Course  I have reviewed the triage vital signs and the nursing notes.  Pertinent labs & imaging results that were available during my care of the patient were reviewed by me and considered in my medical decision making (see chart  for details).    MDM Rules/Calculators/A&P                          Patient presenting for concerns for reinfection of Covid.  He was just diagnosed in the past 10 to 14 days, and is currently symptom-free.  I discussed that due to his recent infection, there is no indication to retest at this time.  Discussed that he should have antibodies that protect him from reinfection for several months.  Patient was also vaccinated, and that decreases his risk of reinfection.  As he is asymptomatic, I do not believe he needs further emergent testing in the ED.  Encourage patient to follow-up with PCP as needed for any further questions.  At this time, patient appears safe for discharge.  Return precautions given.  Patient states he understands and agrees to plan.  Scott Chavez was evaluated in Emergency Department on 02/22/2020 for the symptoms described in the history of present illness. He was evaluated in the context of the global COVID-19 pandemic, which necessitated consideration that the patient might be at risk for infection with the SARS-CoV-2 virus that causes COVID-19. Institutional protocols and algorithms that pertain to the evaluation of patients at risk for COVID-19 are in a state of rapid change based on information released by regulatory bodies including the CDC and federal and state organizations. These policies and algorithms were followed during the patient's care in the ED.  Final Clinical Impression(s) / ED Diagnoses Final diagnoses:  [id number]    Rx / DC Orders ED Discharge Orders    None       Franchot Heidelberg, PA-C 02/22/20 [id number]    Dorie Rank, MD 02/25/20 [phone]

## 2020-02-22 NOTE — Discharge Instructions (Signed)
You do not need to retest for Covid, as you are protected from the infection for the next 3 months. Follow-up with your primary care doctor as needed for further evaluation. Return to the emergency room with any new, worsening, concerning symptoms.

## 2020-02-22 NOTE — ED Triage Notes (Addendum)
Patient arrived stating that he tested positive for Covid-19 and today he has reached his day 10 of quarantine. States he came today to see if his test would be negative or not because he "got the antibody infusion" Patient has no complaints at this time.

## 2020-02-26 ENCOUNTER — Other Ambulatory Visit: Payer: Self-pay | Admitting: *Deleted

## 2020-02-26 DIAGNOSIS — I7409 Other arterial embolism and thrombosis of abdominal aorta: Secondary | ICD-10-CM

## 2020-02-26 DIAGNOSIS — I739 Peripheral vascular disease, unspecified: Secondary | ICD-10-CM

## 2020-02-26 DIAGNOSIS — I70211 Atherosclerosis of native arteries of extremities with intermittent claudication, right leg: Secondary | ICD-10-CM

## 2020-02-26 DIAGNOSIS — I779 Disorder of arteries and arterioles, unspecified: Secondary | ICD-10-CM

## 2020-03-11 ENCOUNTER — Encounter (HOSPITAL_COMMUNITY): Payer: Medicare Other

## 2020-03-11 ENCOUNTER — Ambulatory Visit: Payer: Medicare Other | Admitting: Vascular Surgery

## 2020-04-03 ENCOUNTER — Ambulatory Visit: Payer: Medicare Other | Admitting: Cardiology

## 2020-04-08 DIAGNOSIS — D631 Anemia in chronic kidney disease: Secondary | ICD-10-CM | POA: Diagnosis not present

## 2020-04-08 DIAGNOSIS — N319 Neuromuscular dysfunction of bladder, unspecified: Secondary | ICD-10-CM | POA: Diagnosis not present

## 2020-04-08 DIAGNOSIS — Z940 Kidney transplant status: Secondary | ICD-10-CM | POA: Diagnosis not present

## 2020-04-08 DIAGNOSIS — I129 Hypertensive chronic kidney disease with stage 1 through stage 4 chronic kidney disease, or unspecified chronic kidney disease: Secondary | ICD-10-CM | POA: Diagnosis not present

## 2020-04-08 DIAGNOSIS — D649 Anemia, unspecified: Secondary | ICD-10-CM | POA: Diagnosis not present

## 2020-04-17 DIAGNOSIS — N390 Urinary tract infection, site not specified: Secondary | ICD-10-CM | POA: Diagnosis not present

## 2020-04-17 DIAGNOSIS — Z940 Kidney transplant status: Secondary | ICD-10-CM | POA: Diagnosis not present

## 2020-04-28 ENCOUNTER — Other Ambulatory Visit (HOSPITAL_COMMUNITY): Payer: Self-pay

## 2020-04-29 ENCOUNTER — Other Ambulatory Visit: Payer: Self-pay

## 2020-04-29 ENCOUNTER — Ambulatory Visit (HOSPITAL_COMMUNITY)
Admission: RE | Admit: 2020-04-29 | Discharge: 2020-04-29 | Disposition: A | Discharge status: Home / Self Care | Payer: Medicare Other | Source: Ambulatory Visit | Attending: Nephrology | Admitting: Nephrology

## 2020-04-29 DIAGNOSIS — D631 Anemia in chronic kidney disease: Secondary | ICD-10-CM | POA: Diagnosis not present

## 2020-04-29 MED ORDER — SODIUM CHLORIDE 0.9 % IV SOLN
510.0000 mg | INTRAVENOUS | Status: DC
  Administered 2020-04-29: 12:00:00 510 mg via INTRAVENOUS
  Filled 2020-04-29: qty 510

## 2020-04-29 NOTE — Discharge Instructions (Signed)

## 2020-05-02 DIAGNOSIS — I10 Essential (primary) hypertension: Secondary | ICD-10-CM | POA: Diagnosis not present

## 2020-05-06 ENCOUNTER — Other Ambulatory Visit: Payer: Self-pay

## 2020-05-06 ENCOUNTER — Ambulatory Visit (HOSPITAL_COMMUNITY)
Admission: RE | Admit: 2020-05-06 | Discharge: 2020-05-06 | Disposition: A | Discharge status: Home / Self Care | Payer: Medicare Other | Source: Ambulatory Visit | Attending: Nephrology | Admitting: Nephrology

## 2020-05-06 DIAGNOSIS — D631 Anemia in chronic kidney disease: Secondary | ICD-10-CM | POA: Diagnosis not present

## 2020-05-06 DIAGNOSIS — N189 Chronic kidney disease, unspecified: Secondary | ICD-10-CM | POA: Diagnosis not present

## 2020-05-06 MED ORDER — SODIUM CHLORIDE 0.9 % IV SOLN
510.0000 mg | INTRAVENOUS | Status: DC
  Administered 2020-05-06: 510 mg via INTRAVENOUS
  Filled 2020-05-06: qty 17

## 2020-05-14 ENCOUNTER — Other Ambulatory Visit: Payer: Self-pay | Admitting: *Deleted

## 2020-05-14 DIAGNOSIS — I739 Peripheral vascular disease, unspecified: Secondary | ICD-10-CM

## 2020-05-14 DIAGNOSIS — I779 Disorder of arteries and arterioles, unspecified: Secondary | ICD-10-CM

## 2020-05-14 DIAGNOSIS — I70211 Atherosclerosis of native arteries of extremities with intermittent claudication, right leg: Secondary | ICD-10-CM

## 2020-05-14 DIAGNOSIS — I7409 Other arterial embolism and thrombosis of abdominal aorta: Secondary | ICD-10-CM

## 2020-05-27 ENCOUNTER — Encounter (HOSPITAL_COMMUNITY): Payer: Medicare Other

## 2020-05-27 ENCOUNTER — Ambulatory Visit: Payer: Medicare Other | Admitting: Vascular Surgery

## 2020-05-27 ENCOUNTER — Inpatient Hospital Stay (HOSPITAL_COMMUNITY): Admission: RE | Admit: 2020-05-27 | Payer: Medicare Other | Source: Ambulatory Visit

## 2020-06-09 ENCOUNTER — Other Ambulatory Visit: Payer: Medicare Other

## 2020-06-25 DIAGNOSIS — Z20822 Contact with and (suspected) exposure to covid-19: Secondary | ICD-10-CM | POA: Diagnosis not present

## 2020-06-25 DIAGNOSIS — J0100 Acute maxillary sinusitis, unspecified: Secondary | ICD-10-CM | POA: Diagnosis not present

## 2020-06-25 DIAGNOSIS — I10 Essential (primary) hypertension: Secondary | ICD-10-CM | POA: Diagnosis not present

## 2020-06-25 DIAGNOSIS — J22 Unspecified acute lower respiratory infection: Secondary | ICD-10-CM | POA: Diagnosis not present

## 2020-06-25 DIAGNOSIS — J301 Allergic rhinitis due to pollen: Secondary | ICD-10-CM | POA: Diagnosis not present

## 2020-06-25 DIAGNOSIS — Z87891 Personal history of nicotine dependence: Secondary | ICD-10-CM | POA: Diagnosis not present

## 2020-06-25 DIAGNOSIS — K219 Gastro-esophageal reflux disease without esophagitis: Secondary | ICD-10-CM | POA: Diagnosis not present

## 2020-06-25 DIAGNOSIS — E7800 Pure hypercholesterolemia, unspecified: Secondary | ICD-10-CM | POA: Diagnosis not present

## 2020-06-30 ENCOUNTER — Ambulatory Visit: Payer: Medicare Other | Admitting: Cardiology

## 2020-07-03 DIAGNOSIS — I10 Essential (primary) hypertension: Secondary | ICD-10-CM | POA: Diagnosis not present

## 2020-07-07 ENCOUNTER — Encounter (HOSPITAL_COMMUNITY): Payer: Self-pay

## 2020-07-07 ENCOUNTER — Emergency Department (HOSPITAL_COMMUNITY)
Admission: EM | Admit: 2020-07-07 | Discharge: 2020-07-07 | Disposition: A | Discharge status: Home / Self Care | Payer: Medicare Other | Attending: Emergency Medicine | Admitting: Emergency Medicine

## 2020-07-07 ENCOUNTER — Other Ambulatory Visit: Payer: Self-pay

## 2020-07-07 DIAGNOSIS — Z940 Kidney transplant status: Secondary | ICD-10-CM | POA: Insufficient documentation

## 2020-07-07 DIAGNOSIS — N390 Urinary tract infection, site not specified: Secondary | ICD-10-CM | POA: Diagnosis not present

## 2020-07-07 DIAGNOSIS — Z466 Encounter for fitting and adjustment of urinary device: Secondary | ICD-10-CM | POA: Insufficient documentation

## 2020-07-07 DIAGNOSIS — Z5321 Procedure and treatment not carried out due to patient leaving prior to being seen by health care provider: Secondary | ICD-10-CM | POA: Insufficient documentation

## 2020-07-07 NOTE — ED Notes (Signed)
Called pt x3, no response.

## 2020-07-07 NOTE — ED Notes (Signed)
Pt called for a room, no response

## 2020-07-07 NOTE — ED Triage Notes (Signed)
Patient reports that he may need catheter placed due to recent urinary complications. Had renal transplant 10 years ago and occasionally has to self cath

## 2020-07-21 ENCOUNTER — Other Ambulatory Visit: Payer: Self-pay | Admitting: Cardiology

## 2020-07-21 DIAGNOSIS — E7800 Pure hypercholesterolemia, unspecified: Secondary | ICD-10-CM

## 2020-07-24 ENCOUNTER — Other Ambulatory Visit: Payer: Self-pay | Admitting: *Deleted

## 2020-07-24 DIAGNOSIS — I739 Peripheral vascular disease, unspecified: Secondary | ICD-10-CM

## 2020-07-29 ENCOUNTER — Ambulatory Visit (HOSPITAL_COMMUNITY): Admission: RE | Admit: 2020-07-29 | Payer: Medicare Other | Source: Ambulatory Visit

## 2020-07-29 ENCOUNTER — Ambulatory Visit: Payer: Medicare Other | Admitting: Vascular Surgery

## 2020-07-29 ENCOUNTER — Ambulatory Visit (HOSPITAL_COMMUNITY): Payer: Medicare Other | Attending: Vascular Surgery

## 2020-07-30 ENCOUNTER — Encounter: Payer: Self-pay | Admitting: Vascular Surgery

## 2020-08-02 DIAGNOSIS — I10 Essential (primary) hypertension: Secondary | ICD-10-CM | POA: Diagnosis not present

## 2020-09-09 ENCOUNTER — Ambulatory Visit (INDEPENDENT_AMBULATORY_CARE_PROVIDER_SITE_OTHER)
Admission: RE | Admit: 2020-09-09 | Discharge: 2020-09-09 | Disposition: A | Discharge status: Home / Self Care | Payer: Medicare Other | Source: Ambulatory Visit | Attending: Vascular Surgery | Admitting: Vascular Surgery

## 2020-09-09 ENCOUNTER — Ambulatory Visit (HOSPITAL_COMMUNITY)
Admission: RE | Admit: 2020-09-09 | Discharge: 2020-09-09 | Disposition: A | Discharge status: Home / Self Care | Payer: Medicare Other | Source: Ambulatory Visit | Attending: Vascular Surgery | Admitting: Vascular Surgery

## 2020-09-09 ENCOUNTER — Other Ambulatory Visit: Payer: Self-pay | Admitting: Vascular Surgery

## 2020-09-09 ENCOUNTER — Encounter: Payer: Self-pay | Admitting: Vascular Surgery

## 2020-09-09 ENCOUNTER — Ambulatory Visit (INDEPENDENT_AMBULATORY_CARE_PROVIDER_SITE_OTHER): Payer: Medicare Other | Admitting: Vascular Surgery

## 2020-09-09 ENCOUNTER — Other Ambulatory Visit: Payer: Self-pay

## 2020-09-09 VITALS — BP 150/98 | HR 50 | Temp 98.2°F | Resp 18 | Ht 68.0 in | Wt 137.1 lb

## 2020-09-09 DIAGNOSIS — I70213 Atherosclerosis of native arteries of extremities with intermittent claudication, bilateral legs: Secondary | ICD-10-CM | POA: Diagnosis not present

## 2020-09-09 DIAGNOSIS — I739 Peripheral vascular disease, unspecified: Secondary | ICD-10-CM

## 2020-09-09 NOTE — Progress Notes (Signed)
Patient name: Scott Chavez MRN: [id number] DOB: 09-06-1955 Sex: male  REASON FOR VISIT: Surveillance of bilateral common iliac stents and bilateral common femoral to below-knee popliteal artery bypasses  HPI: Scott Chavez is a 65 y.o. male with history of end-stage renal disease now status post kidney transplant as well as peripheral vascular disease that presents for interval surveillance of his bilateral iliac stents and bilateral lower extremity bypasses.  Patient states as long as he is walking at a moderate pace he is having no significant leg issues at this time.  No rest pain.  No tissue loss.  He is still vaping.  He was previously under the care of Dr. Bridgett Larsson and had bilateral common iliac stents in 2015. He also has a left common femoral to below-knee popliteal bypass with non-reversed ipsilateral vein done on 04/24/2013. And on the right he had an iliofemoral endarterectomy with a right common femoral to below-knee pop bypass with non-reversed ipsilateral saphenous vein in 2016.    Past Medical History:  Diagnosis Date  . ADHD   . Anemia    pt. not sure, but reports he is taking Fe for one month now.  . Aorto-iliac disease ([place])   . Bladder dysfunction   . ESRD (end stage renal disease) [facility])    transplant- [facility] , followed by Dr. Moshe Cipro   . GERD (gastroesophageal reflux disease)   . Hemodialysis patient [facility])    "on dialysis 1998-2010" 04/25/2013)  . Hepatitis C    has been treated with Harvoni  . History of renal failure   . Hypercholesteremia   . Hypertension   . Internal hemorrhoids    Colonoscopy 2010 and anoscopy 2015  . Peripheral vascular disease ([place])   . Pneumonia   . Self-catheterizes urinary bladder    pt. choice, but he reports that he desires to do this to avoid retention     Past Surgical History:  Procedure Laterality Date  . ABDOMINAL AORTAGRAM N/A 03/09/2013   Procedure: ABDOMINAL Maxcine Ham;  Surgeon: Elam Dutch,  MD;  Location: [facility] CATH LAB;  Service: Cardiovascular;  Laterality: N/A;  . AV FISTULA PLACEMENT Left 1998   "removed 2011" (04/25/2013)  . AV FISTULA REPAIR Left    removal with partial vein removed  . COLONOSCOPY    . ENDARTERECTOMY FEMORAL Right 04/17/2015   Procedure: ENDARTERECTOMY RIGHT ILIOFEMORAL WITH PROFUNDOPLASTY;  Surgeon: Conrad Lewis Run, MD;  Location: [place];  Service: Vascular;  Laterality: Right;  . ENDARTERECTOMY FEMORAL Right 10/25/2016   Procedure: RIGHT ILIOFEMORAL ENDARTERECTOMY WITH BOVINE PERICARDIAL PATCH ANGIOPLASTY;  Surgeon: Conrad Tilden, MD;  Location: [place];  Service: Vascular;  Laterality: Right;  . ESOPHAGOGASTRODUODENOSCOPY (EGD) WITH PROPOFOL N/A 02/05/2019   Procedure: ESOPHAGOGASTRODUODENOSCOPY (EGD) WITH PROPOFOL;  Surgeon: Clarene Essex, MD;  Location: [facility] ENDOSCOPY;  Service: Endoscopy;  Laterality: N/A;  . FEMORAL-POPLITEAL BYPASS GRAFT Left 04/24/2013   Procedure: BYPASS GRAFT COMMON FEMORALARTERY-BELOW KNEE POPLITEAL ARTERY WITH GREATER SAPHENOUS VEIN;  Surgeon: Conrad Manchester, MD;  Location: [place];  Service: Vascular;  Laterality: Left;  . FEMORAL-POPLITEAL BYPASS GRAFT Right 04/17/2015   Procedure: BYPASS GRAFT RIGHT FEMORALTO BELOW KNEE POPLITEAL ARTERY USING RIGHT NON REVERSED GREATER SAPPHENOUS VEIN;  Surgeon: Conrad [place], MD;  Location: [place];  Service: Vascular;  Laterality: Right;  . HERNIA REPAIR  08/2019  . INGUINAL HERNIA REPAIR Left 08/29/2019   Procedure: LEFT INGUINAL HERNIA REPAIR WITH MESH;  Surgeon: Erroll Luna, MD;  Location: [place];  Service: General;  Laterality: Left;  . INTRAOPERATIVE ARTERIOGRAM Left 04/24/2013   Procedure: INTRA OPERATIVE ARTERIOGRAM;  Surgeon: Conrad [place], MD;  Location: [place];  Service: Vascular;  Laterality: Left;  . KIDNEY TRANSPLANT    . LOWER EXTREMITY ANGIOGRAM Bilateral 03/09/2013   Procedure: LOWER EXTREMITY ANGIOGRAM;  Surgeon: Elam Dutch, MD;  Location: [facility] CATH LAB;  Service:  Cardiovascular;  Laterality: Bilateral;  . LOWER EXTREMITY ANGIOGRAM Right 10/25/2013   Procedure: LOWER EXTREMITY ANGIOGRAM;  Surgeon: Conrad Napeague, MD;  Location: [facility] CATH LAB;  Service: Cardiovascular;  Laterality: Right;  . LOWER EXTREMITY ANGIOGRAM Right 12/13/2013   Procedure: LOWER EXTREMITY ANGIOGRAM;  Surgeon: Conrad Attica, MD;  Location: [facility] CATH LAB;  Service: Cardiovascular;  Laterality: Right;  . NEPHRECTOMY RECIPIENT  2010  . NEPHRECTOMY TRANSPLANTED ORGAN    . PATCH ANGIOPLASTY Right 04/17/2015   Procedure: Rueben Bash PATCH ANGIOPLASTY, RIGHT ILEOFEMORAL;  Surgeon: Conrad Quinebaug, MD;  Location: [place];  Service: Vascular;  Laterality: Right;  . PERIPHERAL VASCULAR CATHETERIZATION N/A 04/10/2015   Procedure: Abdominal Aortogram;  Surgeon: Conrad Chignik, MD;  Location: [place] CV LAB;  Service: Cardiovascular;  Laterality: N/A;  . PERIPHERAL VASCULAR CATHETERIZATION N/A 05/10/2016   Procedure: Abdominal Aortogram w/Lower Extremity;  Surgeon: Conrad Saxis, MD;  Location: [place] CV LAB;  Service: Cardiovascular;  Laterality: N/A;  . s/p cadaveric renal transplant  December 2010   [place] Left 09/24/2015   Procedure: EXCISION OF THROMBOSED RADIOCEPHALIC ARTERIOVENOUS FISTULA;  Surgeon: Conrad Folcroft, MD;  Location: [place];  Service: Vascular;  Laterality: Left;  . UMBILICAL HERNIA REPAIR N/A 08/29/2019   Procedure: UMBILICAL HERNIA REPAIR WITH MESH;  Surgeon: Erroll Luna, MD;  Location: [place];  Service: General;  Laterality: N/A;  . VEIN HARVEST Right 04/17/2015   Procedure: RIGHT GREATER [place] ;  Surgeon: Conrad , MD;  Location: [place];  Service: Vascular;  Laterality: Right;    Family History  Problem Relation Age of Onset  . Hypertension Father   . Other Father        amputation  . Hypertension Mother     SOCIAL HISTORY: Social History   Tobacco Use  . Smoking status: Former Smoker    Packs/day:  0.50    Years: 34.00    Pack years: 17.00    Types: E-cigarettes    Quit date: 02/17/2003    Years since quitting: 17.5  . Smokeless tobacco: Former Systems developer  . Tobacco comment: vapor cigrarette  Substance Use Topics  . Alcohol use: Not Currently    Alcohol/week: 0.0 standard drinks    Allergies  Allergen Reactions  . Tape Rash and Other (See Comments)    NO Silk tape, please!!    Current Outpatient Medications  Medication Sig Dispense Refill  . albuterol (PROVENTIL HFA;VENTOLIN HFA) 108 (90 Base) MCG/ACT inhaler Inhale 1-2 puffs into the lungs every 6 (six) hours as needed for wheezing or shortness of breath. 1 Inhaler 0  . citalopram (CELEXA) 40 MG tablet Take 20 mg by mouth 2 (two) times daily.   2  . Cyanocobalamin (VITAMIN B-12 PO) Take 1 tablet by mouth daily.    . ferrous sulfate 325 (65 FE) MG EC tablet Take 1 tablet (325 mg total) by mouth 2 (two) times daily. (Patient taking differently: Take 325 mg by mouth 2 (two) times daily. Taking it PRN) 90 tablet 3  . ketotifen (ZADITOR) 0.025 %  ophthalmic solution Place 1 drop into both eyes 2 (two) times daily as needed (for seasonal allergies).    . lisdexamfetamine (VYVANSE) 20 MG capsule Take 20 mg by mouth 2 (two) times daily.     . metoprolol succinate (TOPROL-XL) 25 MG 24 hr tablet Take 25 mg by mouth 2 (two) times daily.   2  . mycophenolate (CELLCEPT) 250 MG capsule Take 250-500 mg by mouth See admin instructions. Take 500 mg by mouth in the morning and 250 mg in the evening    . NIFEdipine (ADALAT CC) 30 MG 24 hr tablet Take 30 mg by mouth at bedtime.    . pantoprazole (PROTONIX) 40 MG tablet Take 40 mg by mouth daily.    . Pyridoxine HCl (VITAMIN B-6 PO) Take 1 tablet by mouth 3 (three) times a week.     . rosuvastatin (CRESTOR) 5 MG tablet TAKE 1 TABLET BY MOUTH EVERYDAY AT BEDTIME 90 tablet 2  . tacrolimus (PROGRAF) 1 MG capsule Take 3-4 mg by mouth 2 (two) times daily. 4 mg in the morning and 3 mg in the evening    .  tadalafil (CIALIS) 20 MG tablet Take 20 mg by mouth daily as needed for erectile dysfunction.    . tamsulosin (FLOMAX) 0.4 MG CAPS capsule Take 0.4 mg by mouth daily.    Marland Kitchen zolpidem (AMBIEN) 10 MG tablet Take 5 mg by mouth at bedtime.     No current facility-administered medications for this visit.    REVIEW OF SYSTEMS:  [X]  denotes positive finding, [ ]  denotes negative finding Cardiac  Comments:  Chest pain or chest pressure:    Shortness of breath upon exertion:    Short of breath when lying flat:    Irregular heart rhythm:        Vascular    Pain in calf, thigh, or hip brought on by ambulation:    Pain in feet at night that wakes you up from your sleep:     Blood clot in your veins:    Leg swelling:         Pulmonary    Oxygen at home:    Productive cough:     Wheezing:         Neurologic    Sudden weakness in arms or legs:     Sudden numbness in arms or legs:     Sudden onset of difficulty speaking or slurred speech:    Temporary loss of vision in one eye:     Problems with dizziness:         Gastrointestinal    Blood in stool:     Vomited blood:         Genitourinary    Burning when urinating:     Blood in urine:        Psychiatric    Major depression:         Hematologic    Bleeding problems:    Problems with blood clotting too easily:        Skin    Rashes or ulcers:        Constitutional    Fever or chills:      PHYSICAL EXAM: Vitals:   09/09/20 1154  BP: (!) 150/98  Pulse: (!) 50  Resp: 18  Temp: 98.2 F (36.8 C)  TempSrc: Temporal  SpO2: 99%  Weight: 137 lb 1.6 oz (62.2 kg)  Height: 5\' 8"  (1.727 m)    GENERAL: The patient is a well-nourished male,  in no acute distress. The vital signs are documented above. CARDIAC: There is a regular rate and rhythm.  VASCULAR:  Palpable femoral pulses bilaterally No palpable pedal pulses PULMONARY: No respiratory distress. ABDOMEN: Soft and non-tender. MUSCULOSKELETAL: There are no major  deformities or cyanosis. NEUROLOGIC: No focal weakness or paresthesias are detected. SKIN: There are no ulcers or rashes noted. PSYCHIATRIC: The patient has a normal affect.  DATA:   ABIs today are 0.84 on the right and 0.89 on the left relatively stable.  Bilateral lower extremity bypasses are patent with no stenosis on arterial duplex today.  Aortoiliac duplex shows greater than 50% stenosis in the right common iliac stent and left external iliac artery.  Assessment/Plan:  65 year old male presents for surveillance of his bilateral common iliac artery stents and bilateral common femoral to below-knee popliteal bypasses with GSV.  Ultimately his bypasses are widely patent on duplex.  There is some evidence of stenosis in the right common iliac stent and the left external iliac artery.  He is not having any significant lower extremity symptoms and states his legs are doing fine when walking at a moderate pace.  Given preservation of his ABIs with no significant lower extremity symptoms I have recommended continued surveillance with another follow-up in 6 months with aortoiliac duplex and ABIs.  In addition he has easily palpable femoral pulses distal to the areas in question.  He has previously stated with any further interventions he wants this done in the operating room under general anesthesia.      [name], MD [facility] Office: [phone]

## 2020-09-10 ENCOUNTER — Other Ambulatory Visit: Payer: Self-pay

## 2020-09-10 DIAGNOSIS — I70213 Atherosclerosis of native arteries of extremities with intermittent claudication, bilateral legs: Secondary | ICD-10-CM

## 2020-10-16 ENCOUNTER — Ambulatory Visit: Payer: Medicare Other | Admitting: Cardiology

## 2020-11-12 DIAGNOSIS — N390 Urinary tract infection, site not specified: Secondary | ICD-10-CM | POA: Diagnosis not present

## 2020-11-12 DIAGNOSIS — Z940 Kidney transplant status: Secondary | ICD-10-CM | POA: Diagnosis not present

## 2020-11-17 DIAGNOSIS — D84821 Immunodeficiency due to drugs: Secondary | ICD-10-CM | POA: Diagnosis not present

## 2020-11-17 DIAGNOSIS — N400 Enlarged prostate without lower urinary tract symptoms: Secondary | ICD-10-CM | POA: Diagnosis not present

## 2020-11-17 DIAGNOSIS — Z7982 Long term (current) use of aspirin: Secondary | ICD-10-CM | POA: Diagnosis not present

## 2020-11-17 DIAGNOSIS — Z87891 Personal history of nicotine dependence: Secondary | ICD-10-CM | POA: Diagnosis not present

## 2020-11-17 DIAGNOSIS — Z79899 Other long term (current) drug therapy: Secondary | ICD-10-CM | POA: Diagnosis not present

## 2020-11-17 DIAGNOSIS — I10 Essential (primary) hypertension: Secondary | ICD-10-CM | POA: Diagnosis not present

## 2020-11-17 DIAGNOSIS — Z940 Kidney transplant status: Secondary | ICD-10-CM | POA: Diagnosis not present

## 2020-11-17 DIAGNOSIS — E785 Hyperlipidemia, unspecified: Secondary | ICD-10-CM | POA: Diagnosis not present

## 2020-11-17 NOTE — Progress Notes (Signed)
Patient walked into clinic last week for a call to schedule an appt - UTR patient. Patient comes back in today as a walk in. He is having "elephantitis" in his bilateral lower legs. There is a mild amount of swelling in both calves, as well as some swelling on his feet. Feet and legs are warm. He is having some pain after he walks. No wounds or redness noted. Dopplerable DP pulses. Advised elevation and placed patient on schedule for vein duplex.

## 2020-12-02 ENCOUNTER — Other Ambulatory Visit: Payer: Self-pay

## 2020-12-02 DIAGNOSIS — M7989 Other specified soft tissue disorders: Secondary | ICD-10-CM

## 2020-12-05 DIAGNOSIS — I10 Essential (primary) hypertension: Secondary | ICD-10-CM | POA: Diagnosis not present

## 2020-12-08 DIAGNOSIS — R69 Illness, unspecified: Secondary | ICD-10-CM | POA: Diagnosis not present

## 2020-12-08 DIAGNOSIS — N319 Neuromuscular dysfunction of bladder, unspecified: Secondary | ICD-10-CM | POA: Diagnosis not present

## 2020-12-08 DIAGNOSIS — D649 Anemia, unspecified: Secondary | ICD-10-CM | POA: Diagnosis not present

## 2020-12-08 DIAGNOSIS — E7800 Pure hypercholesterolemia, unspecified: Secondary | ICD-10-CM | POA: Diagnosis not present

## 2020-12-08 DIAGNOSIS — I129 Hypertensive chronic kidney disease with stage 1 through stage 4 chronic kidney disease, or unspecified chronic kidney disease: Secondary | ICD-10-CM | POA: Diagnosis not present

## 2020-12-08 DIAGNOSIS — Z8619 Personal history of other infectious and parasitic diseases: Secondary | ICD-10-CM | POA: Diagnosis not present

## 2020-12-08 DIAGNOSIS — Z940 Kidney transplant status: Secondary | ICD-10-CM | POA: Diagnosis not present

## 2020-12-08 DIAGNOSIS — I739 Peripheral vascular disease, unspecified: Secondary | ICD-10-CM | POA: Diagnosis not present

## 2020-12-08 DIAGNOSIS — R399 Unspecified symptoms and signs involving the genitourinary system: Secondary | ICD-10-CM | POA: Diagnosis not present

## 2020-12-16 ENCOUNTER — Ambulatory Visit (INDEPENDENT_AMBULATORY_CARE_PROVIDER_SITE_OTHER): Payer: Medicare HMO | Admitting: Physician Assistant

## 2020-12-16 ENCOUNTER — Other Ambulatory Visit: Payer: Self-pay

## 2020-12-16 ENCOUNTER — Ambulatory Visit (HOSPITAL_COMMUNITY)
Admission: RE | Admit: 2020-12-16 | Discharge: 2020-12-16 | Disposition: A | Discharge status: Home / Self Care | Payer: Medicare HMO | Source: Ambulatory Visit | Attending: Vascular Surgery | Admitting: Vascular Surgery

## 2020-12-16 VITALS — BP 160/75 | HR 72 | Temp 96.4°F | Ht 68.0 in | Wt 136.8 lb

## 2020-12-16 DIAGNOSIS — M7989 Other specified soft tissue disorders: Secondary | ICD-10-CM
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
## 2021-01-03 DIAGNOSIS — R339 Retention of urine, unspecified: Secondary | ICD-10-CM | POA: Diagnosis not present

## 2021-01-05 DIAGNOSIS — I10 Essential (primary) hypertension: Secondary | ICD-10-CM | POA: Diagnosis not present

## 2021-01-15 DIAGNOSIS — L309 Dermatitis, unspecified: Secondary | ICD-10-CM | POA: Diagnosis not present

## 2021-02-04 DIAGNOSIS — R69 Illness, unspecified: Secondary | ICD-10-CM | POA: Diagnosis not present

## 2021-02-04 DIAGNOSIS — F900 Attention-deficit hyperactivity disorder, predominantly inattentive type: Secondary | ICD-10-CM | POA: Diagnosis not present

## 2021-02-04 DIAGNOSIS — I10 Essential (primary) hypertension: Secondary | ICD-10-CM | POA: Diagnosis not present

## 2021-02-04 DIAGNOSIS — F411 Generalized anxiety disorder: Secondary | ICD-10-CM | POA: Diagnosis not present

## 2021-02-04 DIAGNOSIS — F321 Major depressive disorder, single episode, moderate: Secondary | ICD-10-CM | POA: Diagnosis not present

## 2021-03-16 DIAGNOSIS — Z940 Kidney transplant status: Secondary | ICD-10-CM | POA: Diagnosis not present

## 2021-03-16 DIAGNOSIS — N390 Urinary tract infection, site not specified: Secondary | ICD-10-CM | POA: Diagnosis not present

## 2021-03-17 ENCOUNTER — Ambulatory Visit: Payer: Medicare HMO

## 2021-03-18 DIAGNOSIS — M545 Low back pain, unspecified: Secondary | ICD-10-CM | POA: Diagnosis not present

## 2021-03-18 DIAGNOSIS — S76211A Strain of adductor muscle, fascia and tendon of right thigh, initial encounter: Secondary | ICD-10-CM | POA: Diagnosis not present

## 2021-03-20 ENCOUNTER — Other Ambulatory Visit: Payer: Self-pay | Admitting: Cardiology

## 2021-03-20 DIAGNOSIS — E7800 Pure hypercholesterolemia, unspecified: Secondary | ICD-10-CM

## 2021-04-08 DIAGNOSIS — Z940 Kidney transplant status: Secondary | ICD-10-CM | POA: Diagnosis not present

## 2021-04-08 DIAGNOSIS — Z23 Encounter for immunization: Secondary | ICD-10-CM | POA: Diagnosis not present

## 2021-04-08 DIAGNOSIS — I739 Peripheral vascular disease, unspecified: Secondary | ICD-10-CM | POA: Diagnosis not present

## 2021-04-08 DIAGNOSIS — E7800 Pure hypercholesterolemia, unspecified: Secondary | ICD-10-CM | POA: Diagnosis not present

## 2021-04-08 DIAGNOSIS — Z8619 Personal history of other infectious and parasitic diseases: Secondary | ICD-10-CM | POA: Diagnosis not present

## 2021-04-08 DIAGNOSIS — R69 Illness, unspecified: Secondary | ICD-10-CM | POA: Diagnosis not present

## 2021-04-08 DIAGNOSIS — N319 Neuromuscular dysfunction of bladder, unspecified: Secondary | ICD-10-CM | POA: Diagnosis not present

## 2021-04-08 DIAGNOSIS — I129 Hypertensive chronic kidney disease with stage 1 through stage 4 chronic kidney disease, or unspecified chronic kidney disease: Secondary | ICD-10-CM | POA: Diagnosis not present

## 2021-04-08 DIAGNOSIS — R399 Unspecified symptoms and signs involving the genitourinary system: Secondary | ICD-10-CM | POA: Diagnosis not present

## 2021-04-08 DIAGNOSIS — D649 Anemia, unspecified: Secondary | ICD-10-CM | POA: Diagnosis not present

## 2021-05-30 ENCOUNTER — Emergency Department (HOSPITAL_COMMUNITY): Payer: Medicare Other

## 2021-05-30 ENCOUNTER — Emergency Department (HOSPITAL_COMMUNITY)
Admission: EM | Admit: 2021-05-30 | Discharge: 2021-05-30 | Disposition: A | Discharge status: Home / Self Care | Payer: Medicare Other | Attending: Emergency Medicine | Admitting: Emergency Medicine

## 2021-05-30 ENCOUNTER — Other Ambulatory Visit: Payer: Self-pay

## 2021-05-30 DIAGNOSIS — R5383 Other fatigue: Secondary | ICD-10-CM | POA: Insufficient documentation

## 2021-05-30 DIAGNOSIS — U071 COVID-19: Secondary | ICD-10-CM | POA: Diagnosis not present

## 2021-05-30 DIAGNOSIS — R5381 Other malaise: Secondary | ICD-10-CM | POA: Diagnosis not present

## 2021-05-30 DIAGNOSIS — R197 Diarrhea, unspecified: Secondary | ICD-10-CM | POA: Insufficient documentation

## 2021-05-30 DIAGNOSIS — Z20822 Contact with and (suspected) exposure to covid-19: Secondary | ICD-10-CM | POA: Insufficient documentation

## 2021-05-30 DIAGNOSIS — R0602 Shortness of breath: Secondary | ICD-10-CM | POA: Diagnosis not present

## 2021-05-30 DIAGNOSIS — R059 Cough, unspecified: Secondary | ICD-10-CM | POA: Insufficient documentation

## 2021-05-30 DIAGNOSIS — R0600 Dyspnea, unspecified: Secondary | ICD-10-CM

## 2021-05-30 LAB — COMPREHENSIVE METABOLIC PANEL
ALT: 12 U/L (ref 0–44)
AST: 21 U/L (ref 15–41)
Albumin: 4.2 g/dL (ref 3.5–5.0)
Alkaline Phosphatase: 61 U/L (ref 38–126)
Anion gap: 9 (ref 5–15)
BUN: 19 mg/dL (ref 8–23)
CO2: 24 mmol/L (ref 22–32)
Calcium: 9.9 mg/dL (ref 8.9–10.3)
Chloride: 103 mmol/L (ref 98–111)
Creatinine, Ser: 1.66 mg/dL — ABNORMAL HIGH (ref 0.61–1.24)
GFR, Estimated: 45 mL/min — ABNORMAL LOW (ref >60)
Glucose, Bld: 95 mg/dL (ref 70–99)
Potassium: 3.6 mmol/L (ref 3.5–5.1)
Sodium: 136 mmol/L (ref 135–145)
Total Bilirubin: 0.8 mg/dL (ref 0.3–1.2)
Total Protein: 7.9 g/dL (ref 6.5–8.1)

## 2021-05-30 LAB — CBC WITH DIFFERENTIAL/PLATELET
Abs Immature Granulocytes: 0.01 10*3/uL (ref 0.00–0.07)
Basophils Absolute: 0 10*3/uL (ref 0.0–0.1)
Basophils Relative: 0 %
Eosinophils Absolute: 0.2 10*3/uL (ref 0.0–0.5)
Eosinophils Relative: 3 %
HCT: 41 % (ref 39.0–52.0)
Hemoglobin: 12.5 g/dL — ABNORMAL LOW (ref 13.0–17.0)
Immature Granulocytes: 0 %
Lymphocytes Relative: 9 %
Lymphs Abs: 0.5 10*3/uL — ABNORMAL LOW (ref 0.7–4.0)
MCH: 25 pg — ABNORMAL LOW (ref 26.0–34.0)
MCHC: 30.5 g/dL (ref 30.0–36.0)
MCV: 82 fL (ref 80.0–100.0)
Monocytes Absolute: 0.4 10*3/uL (ref 0.1–1.0)
Monocytes Relative: 6 %
Neutro Abs: 4.9 10*3/uL (ref 1.7–7.7)
Neutrophils Relative %: 82 %
Platelets: 281 10*3/uL (ref 150–400)
RBC: 5 MIL/uL (ref 4.22–5.81)
RDW: 18.4 % — ABNORMAL HIGH (ref 11.5–15.5)
WBC: 6 10*3/uL (ref 4.0–10.5)
nRBC: 0 % (ref 0.0–0.2)

## 2021-05-30 LAB — RESP PANEL BY RT-PCR (FLU A&B, COVID) ARPGX2
Influenza A by PCR: NEGATIVE
Influenza B by PCR: NEGATIVE
SARS Coronavirus 2 by RT PCR: NEGATIVE

## 2021-05-30 NOTE — ED Provider Triage Note (Signed)
Emergency Medicine Provider Triage Evaluation Note  Scott Chavez , a 66 y.o. male  was evaluated in triage.  Pt complains of positive at-home COVID test 2 days ago on the same day as his symptoms. He states that his symptoms include nausea, diarrhea, body aches, shortness of breath, cough, and fatigue.  He is immunosuppressed as he has a kidney transplant.  He has not been on any oral antivirals for his COVID diagnosis.  He states he has had COVID vaccines previously and did have COVID last year.   Physical Exam  BP 132/74 (BP Location: Right Arm)    Pulse 92    Temp 98.4 F (36.9 C) (Oral)    Resp 16    Ht 5\' 10"  (1.778 m)    Wt 61.1 kg    SpO2 93%    BMI 19.33 kg/m  Gen:   Awake, no distress   Resp:  Normal effort  MSK:   Moves extremities without difficulty  Other:  Normal speech.   Medical Decision Making  Medically screening exam initiated at 7:57 PM.  Appropriate orders placed.  ZAYED GRIFFIE was informed that the remainder of the evaluation will be completed by another provider, this initial triage assessment does not replace that evaluation, and the importance of remaining in the ED until their evaluation is complete.  KEAGON GLASCOE was evaluated in Emergency Department on 05/30/2021 for the symptoms described in the history of present illness. He was evaluated in the context of the global COVID-19 pandemic, which necessitated consideration that the patient might be at risk for infection with the SARS-CoV-2 virus that causes COVID-19. Institutional protocols and algorithms that pertain to the evaluation of patients at risk for COVID-19 are in a state of rapid change based on information released by regulatory bodies including the CDC and federal and state organizations. These policies and algorithms were followed during the patient's care in the ED.  Note: Portions of this report may have been transcribed using voice recognition software. Every effort was made to ensure accuracy;  however, inadvertent computerized transcription errors may be present    Lorin Glass, PA-C 05/30/21 1958

## 2021-05-30 NOTE — ED Provider Notes (Signed)
[facility] DEPT Provider Note   CSN: [id number] Arrival date & time: 05/30/21  [id number]     History  Chief Complaint  Patient presents with   Cough   Diarrhea   Fatigue    HAIRO GARRAWAY is a 66 y.o. male.  66 year old male with prior medical history as detailed below presents for evaluation.  Patient with 4 to 5 days of malaise, fatigue, mild diarrhea.  Patient reports that he performed a home COVID test 2 days prior that was positive.  Patient is concerned that he may have COVID again.  He reports prior episode of COVID infection.  He reports that he is up-to-date on all COVID vaccinations.  He denies chest pain or shortness of breath.  He denies recent fever.  He denies difficulty urinating.  He is fully compliant with previously prescribed medications including his immunosuppressants for his renal transplant.  The history is provided by the patient and medical records.  Cough Cough characteristics:  Non-productive Sputum characteristics:  Nondescript Severity:  Mild Onset quality:  Gradual Duration:  4 days Timing:  Constant Progression:  Waxing and waning Chronicity:  New Diarrhea     Home Medications Prior to Admission medications   Medication Sig Start Date End Date Taking? Authorizing Provider  albuterol (PROVENTIL HFA;VENTOLIN HFA) 108 (90 Base) MCG/ACT inhaler Inhale 1-2 puffs into the lungs every 6 (six) hours as needed for wheezing or shortness of breath. 07/17/18   Rancour, Annie Main, MD  citalopram (CELEXA) 40 MG tablet Take 20 mg by mouth 2 (two) times daily.  02/18/17   [provider]  Cyanocobalamin (VITAMIN B-12 PO) Take 1 tablet by mouth daily.    [provider]  ferrous sulfate 325 (65 FE) MG EC tablet Take 1 tablet (325 mg total) by mouth 2 (two) times daily. Patient taking differently: Take 325 mg by mouth 2 (two) times daily. Taking it PRN 02/07/19   Nita Sells, MD  ketotifen (ZADITOR) 0.025 %  ophthalmic solution Place 1 drop into both eyes 2 (two) times daily as needed (for seasonal allergies).    [provider]  lisdexamfetamine (VYVANSE) 20 MG capsule Take 20 mg by mouth 2 (two) times daily.     [provider]  metoprolol succinate (TOPROL-XL) 25 MG 24 hr tablet Take 25 mg by mouth 2 (two) times daily.  08/16/17   [provider]  mycophenolate (CELLCEPT) 250 MG capsule Take 250-500 mg by mouth See admin instructions. Take 500 mg by mouth in the morning and 250 mg in the evening    [provider]  NIFEdipine (ADALAT CC) 30 MG 24 hr tablet Take 30 mg by mouth at bedtime. 09/12/19   [provider]  pantoprazole (PROTONIX) 40 MG tablet Take 40 mg by mouth daily.    [provider]  Pyridoxine HCl (VITAMIN B-6 PO) Take 1 tablet by mouth 3 (three) times a week.     [provider]  rosuvastatin (CRESTOR) 5 MG tablet TAKE 1 TABLET BY MOUTH EVERYDAY AT BEDTIME 07/21/20   Adrian Prows, MD  tacrolimus (PROGRAF) 1 MG capsule Take 3-4 mg by mouth 2 (two) times daily. 4 mg in the morning and 3 mg in the evening    [provider]  tadalafil (CIALIS) 20 MG tablet Take 20 mg by mouth daily as needed for erectile dysfunction.    [provider]  tamsulosin (FLOMAX) 0.4 MG CAPS capsule Take 0.4 mg by mouth daily.    [provider]  zolpidem (AMBIEN) 10 MG tablet Take 5 mg by mouth at bedtime.    [provider]      Allergies    Tape    Review of Systems   Review of Systems  Respiratory:  Positive for cough.   Gastrointestinal:  Positive for diarrhea.  All other systems reviewed and are negative.  Physical Exam Updated Vital Signs BP 128/70    Pulse 87    Temp 98.4 F (36.9 C) (Oral)    Resp 16    Ht 5\' 10"  (1.778 m)    Wt 61.1 kg    SpO2 97%    BMI 19.33 kg/m  Physical Exam Vitals and nursing note reviewed.  Constitutional:      General: He is not in acute distress.    Appearance: Normal  appearance. He is well-developed.  HENT:     Head: Normocephalic and atraumatic.  Eyes:     Conjunctiva/sclera: Conjunctivae normal.     Pupils: Pupils are equal, round, and reactive to light.  Cardiovascular:     Rate and Rhythm: Normal rate and regular rhythm.     Heart sounds: Normal heart sounds.  Pulmonary:     Effort: Pulmonary effort is normal. No respiratory distress.     Breath sounds: Normal breath sounds.  Abdominal:     General: There is no distension.     Palpations: Abdomen is soft.     Tenderness: There is no abdominal tenderness.  Musculoskeletal:        General: No deformity. Normal range of motion.     Cervical back: Normal range of motion and neck supple.  Skin:    General: Skin is warm and dry.  Neurological:     General: No focal deficit present.     Mental Status: He is alert and oriented to person, place, and time.    ED Results / Procedures / Treatments   Labs (all labs ordered are listed, but only abnormal results are displayed) Labs Reviewed  CBC WITH DIFFERENTIAL/PLATELET - Abnormal; Notable for the following components:      Result Value   Hemoglobin 12.5 (*)    MCH 25.0 (*)    RDW 18.4 (*)    Lymphs Abs 0.5 (*)    All other components within normal limits  COMPREHENSIVE METABOLIC PANEL - Abnormal; Notable for the following components:   Creatinine, Ser 1.66 (*)    GFR, Estimated 45 (*)    All other components within normal limits  RESP PANEL BY RT-PCR (FLU A&B, COVID) [id number]    EKG EKG Interpretation  Date/Time:  Saturday May 30 2021 21:33:13 EST Ventricular Rate:  83 PR Interval:  144 QRS Duration: 89 QT Interval:  379 QTC Calculation: 446 R Axis:   -68 Text Interpretation: Sinus rhythm Biatrial enlargement Left anterior fascicular block Abnormal R-wave progression, early transition Nonspecific T abnrm, anterolateral leads Confirmed by Dene Gentry [phone]) on 05/30/2021 9:35:31 PM  Radiology DG Chest Port 1 View  Result  Date: 05/30/2021 CLINICAL DATA:  COVID-19 positivity with shortness of breath and cough, initial encounter EXAM: PORTABLE CHEST 1 VIEW COMPARISON:  02/12/2020 FINDINGS: Cardiac shadow is stable. Aortic calcifications are noted and stable. The lungs are well aerated bilaterally. No focal infiltrate or sizable effusion is seen. No acute bony abnormality is noted. IMPRESSION: No acute abnormality noted. Electronically Signed   By: Inez Catalina M.D.   On: 05/30/2021 21:54    Procedures Procedures    Medications Ordered in  ED Medications - No data to display  ED Course/ Medical Decision Making/ A&P                           Medical Decision Making   Medical Screen Complete  This patient presented to the ED with complaint of suspected COVID infection.  This complaint involves an extensive number of treatment options. The initial differential diagnosis includes, but is not limited to, viral infection, COVID infection, influenza infection, metabolic abnormality, AKI.  This presentation is: Acute, Previously Undiagnosed, Uncertain Prognosis, Complicated, Systemic Symptoms, and Threat to Life/Bodily Function   Patient presents with 4 to 5 days of malaise, fatigue, and concern for possible viral infection.  Patient reports home COVID test that was + 2 days prior.  He denies fever.  He denies shortness of breath.  PCR COVID and flu testing today is negative for  Other screening labs are without significant abnormality. Chest x-ray is without acute pathology  Patient feels improved.    Patient is reassured with likelihood that his home COVID test is a false positive.  Importance of close follow-up was stressed.  Strict return precautions given and understood.   Co morbidities that complicated the patient's evaluation  History of renal transplant, on immunosuppression   Additional history obtained:  External records from outside sources obtained and reviewed including prior ED visits  and prior Inpatient records.    Lab Tests:  I ordered and personally interpreted labs.  The pertinent results include: CBC, CMP, COVID and flu   Imaging Studies ordered:  I ordered imaging studies including chest x-ray I independently visualized and interpreted obtained imaging which showed no acute disease I agree with the radiologist interpretation.   Cardiac Monitoring:  The patient was maintained on a cardiac monitor.  I personally viewed and interpreted the cardiac monitor which showed an underlying rhythm of: Normal sinus rhythm     Problem List / ED Course:  Malaise, fatigue, likely viral syndrome   Reevaluation:  After the interventions noted above, I reevaluated the patient and found that they have: improved   Disposition:  After consideration of the diagnostic results and the patients response to treatment, I feel that the patent would benefit from close outpatient follow-up.          Final Clinical Impression(s) / ED Diagnoses Final diagnoses:  Malaise and fatigue    Rx / DC Orders ED Discharge Orders     None         Valarie Merino, MD 05/30/21 2243

## 2021-05-30 NOTE — Discharge Instructions (Addendum)
Return for any problem.  Drink plenty of fluids.

## 2021-05-30 NOTE — ED Triage Notes (Signed)
Patient arrives complaining of cough, diarrhea, weakness, and a positive at home COVID test. Patient is a kidney transplant patient with a restricted left extremity.

## 2021-06-07 ENCOUNTER — Other Ambulatory Visit: Payer: Self-pay | Admitting: Cardiology

## 2021-06-07 DIAGNOSIS — E7800 Pure hypercholesterolemia, unspecified: Secondary | ICD-10-CM

## 2021-06-07 DIAGNOSIS — I10 Essential (primary) hypertension: Secondary | ICD-10-CM | POA: Diagnosis not present

## 2021-07-02 DIAGNOSIS — R339 Retention of urine, unspecified: Secondary | ICD-10-CM | POA: Diagnosis not present

## 2021-07-07 DIAGNOSIS — I10 Essential (primary) hypertension: Secondary | ICD-10-CM | POA: Diagnosis not present

## 2021-07-18 ENCOUNTER — Ambulatory Visit (HOSPITAL_COMMUNITY)
Admission: EM | Admit: 2021-07-18 | Discharge: 2021-07-18 | Disposition: A | Discharge status: Home / Self Care | Payer: Medicare Other

## 2021-07-18 ENCOUNTER — Other Ambulatory Visit: Payer: Self-pay

## 2021-07-18 ENCOUNTER — Encounter (HOSPITAL_COMMUNITY): Payer: Self-pay | Admitting: *Deleted

## 2021-07-18 DIAGNOSIS — R399 Unspecified symptoms and signs involving the genitourinary system: Secondary | ICD-10-CM

## 2021-07-18 NOTE — ED Triage Notes (Signed)
Pt reports back pain for several weeks ago. Pt reports he has a UTI. ?

## 2021-07-18 NOTE — ED Provider Notes (Signed)
?[place] ? ? ? ?CSN: [id number] ?Arrival date & time: 07/18/21  1730 ? ? ?  ? ?History   ?Chief Complaint ?Chief Complaint  ?Patient presents with  ? Back Pain  ? UTI  ? ? ?HPI ?Scott Chavez is a 66 y.o. male.  ? ?Patient presents for urinary tract infection symptoms.  Patient requires straight cath, but he does not have supplies.  Patient states he will return tomorrow so he is able to provide a sample. ? ? ?Back Pain ? ?Past Medical History:  ?Diagnosis Date  ? ADHD   ? Anemia   ? pt. not sure, but reports he is taking Fe for one month now.  ? Aorto-iliac disease ([place])   ? Bladder dysfunction   ? ESRD (end stage renal disease) ([place])   ? [facility] , followed by Dr. Moshe Cipro   ? GERD (gastroesophageal reflux disease)   ? Hemodialysis patient [facility])   ? "on dialysis 1998-2010" 04/25/2013)  ? Hepatitis C   ? has been treated with Harvoni  ? History of renal failure   ? Hypercholesteremia   ? Hypertension   ? Internal hemorrhoids   ? Colonoscopy 2010 and anoscopy 2015  ? Peripheral vascular disease ([place])   ? Pneumonia   ? Self-catheterizes urinary bladder   ? pt. choice, but he reports that he desires to do this to avoid retention   ? ? ?Patient Active Problem List  ? Diagnosis Date Noted  ? Acute upper GI bleeding 02/05/2019  ? GI bleeding 02/04/2019  ? AKI (acute kidney injury) ([place]) 02/04/2019  ? Iron deficiency anemia 04/06/2017  ? Closed fracture of left malar bone ([place]) 10/11/2016  ? Chronic neck pain 03/15/2016  ? Chronic low back pain without sciatica 03/15/2016  ? Nonallopathic lesion of lumbosacral region 03/15/2016  ? Nonallopathic lesion of sacral region 03/15/2016  ? Nonallopathic lesion of thoracic region 03/15/2016  ? Liver fibrosis 12/18/2015  ? Arteriovenous fistula thrombosis ([place]) 10/16/2015  ? Chronic hepatitis C without hepatic coma ([place]) 09/03/2015  ? Atherosclerosis of native arteries of extremities with intermittent claudication, right leg ([place])  04/17/2015  ? Pain in joint, lower leg 09/13/2014  ? Hemorrhoids, internal, with bleeding 01/29/2014  ? Pain in limb 08/17/2013  ? Peripheral vascular disease, unspecified ([place]) 06/08/2013  ? Redness of skin-Calf to foot LEFT 05/25/2013  ? Tenderness in limb-Calf to foot LEFT 05/25/2013  ? Swelling of limb-Calf to foot LEFT 05/25/2013  ? Postoperative pain 05/02/2013  ? PAD (peripheral artery disease) ([place]) 04/24/2013  ? Preop cardiovascular exam 03/27/2013  ? Essential hypertension 03/27/2013  ? Hyperlipidemia 03/27/2013  ? Atherosclerosis of native arteries of extremity with intermittent claudication ([place]) 02/16/2013  ? History of renal transplant 02/16/2013  ? Aorto-iliac disease ([place]) 10/29/2011  ? Chest pain 10/29/2011  ? Neurogenic dysfunction of the urinary bladder 04/24/11  ? Deceased-donor kidney transplant recipient Apr 24, 2011  ? Immunosuppression ([place]) 04/24/11  ? ? ?Past Surgical History:  ?Procedure Laterality Date  ? ABDOMINAL AORTAGRAM N/A 03/09/2013  ? Procedure: ABDOMINAL AORTAGRAM;  Surgeon: Elam Dutch, MD;  Location: [facility] CATH LAB;  Service: Cardiovascular;  Laterality: N/A;  ? AV FISTULA PLACEMENT Left 1998  ? "removed 2011" (04/25/2013)  ? AV FISTULA REPAIR Left   ? removal with partial vein removed  ? COLONOSCOPY    ? ENDARTERECTOMY FEMORAL Right 04/17/2015  ? Procedure: ENDARTERECTOMY RIGHT ILIOFEMORAL WITH PROFUNDOPLASTY;  Surgeon: Conrad Granger, MD;  Location: [place];  Service: Vascular;  Laterality: Right;  ? ENDARTERECTOMY FEMORAL Right 10/25/2016  ? Procedure: RIGHT ILIOFEMORAL ENDARTERECTOMY WITH BOVINE PERICARDIAL PATCH ANGIOPLASTY;  Surgeon: Conrad Crawford, MD;  Location: [place];  Service: Vascular;  Laterality: Right;  ? ESOPHAGOGASTRODUODENOSCOPY (EGD) WITH PROPOFOL N/A 02/05/2019  ? Procedure: ESOPHAGOGASTRODUODENOSCOPY (EGD) WITH PROPOFOL;  Surgeon: Clarene Essex, MD;  Location: [facility] ENDOSCOPY;  Service: Endoscopy;  Laterality: N/A;  ? FEMORAL-POPLITEAL BYPASS GRAFT Left 04/24/2013  ?  Procedure: BYPASS GRAFT COMMON FEMORALARTERY-BELOW KNEE POPLITEAL ARTERY WITH GREATER SAPHENOUS VEIN;  Surgeon: Conrad [place], MD;  Location: [place];  Service: Vascular;  Laterality: Left;  ? FEMORAL-POPLITEAL BYPASS GRAFT Right 04/17/2015  ? Procedure: BYPASS GRAFT RIGHT FEMORALTO BELOW KNEE POPLITEAL ARTERY USING RIGHT NON REVERSED GREATER SAPPHENOUS VEIN;  Surgeon: Conrad Dana, MD;  Location: [place];  Service: Vascular;  Laterality: Right;  ? HERNIA REPAIR  08/2019  ? INGUINAL HERNIA REPAIR Left 08/29/2019  ? Procedure: LEFT INGUINAL HERNIA REPAIR WITH MESH;  Surgeon: Erroll Luna, MD;  Location: [place];  Service: General;  Laterality: Left;  ? INTRAOPERATIVE ARTERIOGRAM Left 04/24/2013  ? Procedure: INTRA OPERATIVE ARTERIOGRAM;  Surgeon: Conrad [place], MD;  Location: [place];  Service: Vascular;  Laterality: Left;  ? KIDNEY TRANSPLANT    ? LOWER EXTREMITY ANGIOGRAM Bilateral 03/09/2013  ? Procedure: LOWER EXTREMITY ANGIOGRAM;  Surgeon: Elam Dutch, MD;  Location: [facility] CATH LAB;  Service: Cardiovascular;  Laterality: Bilateral;  ? LOWER EXTREMITY ANGIOGRAM Right 10/25/2013  ? Procedure: LOWER EXTREMITY ANGIOGRAM;  Surgeon: Conrad [place], MD;  Location: [facility] CATH LAB;  Service: Cardiovascular;  Laterality: Right;  ? LOWER EXTREMITY ANGIOGRAM Right 12/13/2013  ? Procedure: LOWER EXTREMITY ANGIOGRAM;  Surgeon: Conrad Volo, MD;  Location: [facility] CATH LAB;  Service: Cardiovascular;  Laterality: Right;  ? NEPHRECTOMY RECIPIENT  2010  ? NEPHRECTOMY TRANSPLANTED ORGAN    ? PATCH ANGIOPLASTY Right 04/17/2015  ? Procedure: [place], RIGHT ILEOFEMORAL;  Surgeon: Conrad Hereford, MD;  Location: [place];  Service: Vascular;  Laterality: Right;  ? PERIPHERAL VASCULAR CATHETERIZATION N/A 04/10/2015  ? Procedure: Abdominal Aortogram;  Surgeon: Conrad [place], MD;  Location: [place] CV LAB;  Service: Cardiovascular;  Laterality: N/A;  ? PERIPHERAL VASCULAR CATHETERIZATION N/A 05/10/2016  ? Procedure:  Abdominal Aortogram w/Lower Extremity;  Surgeon: Conrad Foss, MD;  Location: [place] CV LAB;  Service: Cardiovascular;  Laterality: N/A;  ? s/p cadaveric renal transplant  December 2010  ? [facility]  ? THROMBECTOMY W/ EMBOLECTOMY Left 09/24/2015  ? Procedure: EXCISION OF THROMBOSED RADIOCEPHALIC ARTERIOVENOUS FISTULA;  Surgeon: Conrad Sawyer, MD;  Location: [place];  Service: Vascular;  Laterality: Left;  ? UMBILICAL HERNIA REPAIR N/A 08/29/2019  ? Procedure: UMBILICAL HERNIA REPAIR WITH MESH;  Surgeon: Erroll Luna, MD;  Location: [place];  Service: General;  Laterality: N/A;  ? VEIN HARVEST Right 04/17/2015  ? Procedure: RIGHT GREATER SAPPHENOUS VEIN HARVEST ;  Surgeon: Conrad Oronogo, MD;  Location: [place];  Service: Vascular;  Laterality: Right;  ? ? ? ? ? ?Home Medications   ? ?Prior to Admission medications   ?Medication Sig Start Date End Date Taking? Authorizing Provider  ?albuterol (PROVENTIL HFA;VENTOLIN HFA) 108 (90 Base) MCG/ACT inhaler Inhale 1-2 puffs into the lungs every 6 (six) hours as needed for wheezing or shortness of breath. 07/17/18   Rancour, Annie Main, MD  ?citalopram (CELEXA) 40 MG tablet Take 20 mg by mouth 2 (two) times daily.  02/18/17  [provider]  ?Cyanocobalamin (VITAMIN B-12 PO) Take 1 tablet by mouth daily.    [provider]  ?ferrous sulfate 325 (65 FE) MG EC tablet Take 1 tablet (325 mg total) by mouth 2 (two) times daily. ?Patient taking differently: Take 325 mg by mouth 2 (two) times daily. Taking it PRN 02/07/19   Nita Sells, MD  ?ketotifen (ZADITOR) 0.025 % ophthalmic solution Place 1 drop into both eyes 2 (two) times daily as needed (for seasonal allergies).    [provider]  ?lisdexamfetamine (VYVANSE) 20 MG capsule Take 20 mg by mouth 2 (two) times daily.     [provider]  ?metoprolol succinate (TOPROL-XL) 25 MG 24 hr tablet Take 25 mg by mouth 2 (two) times daily.  08/16/17   [provider]   ?mycophenolate (CELLCEPT) 250 MG capsule Take 250-500 mg by mouth See admin instructions. Take 500 mg by mouth in the morning and 250 mg in the evening    [provider]  ?NIFEdipine (ADALAT CC) 30 MG 2

## 2021-07-21 DIAGNOSIS — N390 Urinary tract infection, site not specified: Secondary | ICD-10-CM | POA: Diagnosis not present

## 2021-07-21 DIAGNOSIS — Z940 Kidney transplant status: Secondary | ICD-10-CM | POA: Diagnosis not present

## 2021-08-04 DIAGNOSIS — I739 Peripheral vascular disease, unspecified: Secondary | ICD-10-CM | POA: Diagnosis not present

## 2021-08-04 DIAGNOSIS — N319 Neuromuscular dysfunction of bladder, unspecified: Secondary | ICD-10-CM | POA: Diagnosis not present

## 2021-08-04 DIAGNOSIS — I129 Hypertensive chronic kidney disease with stage 1 through stage 4 chronic kidney disease, or unspecified chronic kidney disease: Secondary | ICD-10-CM | POA: Diagnosis not present

## 2021-08-04 DIAGNOSIS — E7800 Pure hypercholesterolemia, unspecified: Secondary | ICD-10-CM | POA: Diagnosis not present

## 2021-08-04 DIAGNOSIS — Z940 Kidney transplant status: Secondary | ICD-10-CM | POA: Diagnosis not present

## 2021-08-04 DIAGNOSIS — Z8619 Personal history of other infectious and parasitic diseases: Secondary | ICD-10-CM | POA: Diagnosis not present

## 2021-08-04 DIAGNOSIS — D649 Anemia, unspecified: Secondary | ICD-10-CM | POA: Diagnosis not present

## 2021-08-07 DIAGNOSIS — I10 Essential (primary) hypertension: Secondary | ICD-10-CM | POA: Diagnosis not present
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
## 2021-08-25 ENCOUNTER — Encounter: Payer: Self-pay | Admitting: Physician Assistant

## 2021-08-25 ENCOUNTER — Ambulatory Visit (INDEPENDENT_AMBULATORY_CARE_PROVIDER_SITE_OTHER): Payer: Medicare Other | Admitting: Physician Assistant

## 2021-08-25 VITALS — BP 118/62 | HR 67 | Temp 98.3°F | Resp 18 | Ht 70.0 in | Wt 143.3 lb

## 2021-08-25 DIAGNOSIS — I872 Venous insufficiency (chronic) (peripheral): Secondary | ICD-10-CM | POA: Diagnosis not present

## 2021-08-25 DIAGNOSIS — I739 Peripheral vascular disease, unspecified: Secondary | ICD-10-CM

## 2021-08-27 ENCOUNTER — Other Ambulatory Visit: Payer: Self-pay | Admitting: *Deleted

## 2021-08-27 DIAGNOSIS — I779 Disorder of arteries and arterioles, unspecified: Secondary | ICD-10-CM

## 2021-08-27 DIAGNOSIS — I70213 Atherosclerosis of native arteries of extremities with intermittent claudication, bilateral legs: Secondary | ICD-10-CM

## 2021-08-27 DIAGNOSIS — I739 Peripheral vascular disease, unspecified: Secondary | ICD-10-CM

## 2021-09-06 DIAGNOSIS — I10 Essential (primary) hypertension: Secondary | ICD-10-CM | POA: Diagnosis not present

## 2021-09-09 ENCOUNTER — Ambulatory Visit (INDEPENDENT_AMBULATORY_CARE_PROVIDER_SITE_OTHER)
Admission: RE | Admit: 2021-09-09 | Discharge: 2021-09-09 | Disposition: A | Discharge status: Home / Self Care | Payer: Medicare Other | Source: Ambulatory Visit | Attending: Vascular Surgery | Admitting: Vascular Surgery

## 2021-09-09 ENCOUNTER — Ambulatory Visit (HOSPITAL_COMMUNITY)
Admission: RE | Admit: 2021-09-09 | Discharge: 2021-09-09 | Disposition: A | Discharge status: Home / Self Care | Payer: Medicare Other | Source: Ambulatory Visit | Attending: Vascular Surgery | Admitting: Vascular Surgery

## 2021-09-09 DIAGNOSIS — I739 Peripheral vascular disease, unspecified: Secondary | ICD-10-CM | POA: Diagnosis not present

## 2021-09-09 DIAGNOSIS — I70213 Atherosclerosis of native arteries of extremities with intermittent claudication, bilateral legs: Secondary | ICD-10-CM

## 2021-09-09 DIAGNOSIS — I779 Disorder of arteries and arterioles, unspecified: Secondary | ICD-10-CM | POA: Insufficient documentation

## 2021-09-16 DIAGNOSIS — R339 Retention of urine, unspecified: Secondary | ICD-10-CM | POA: Diagnosis not present

## 2021-10-07 DIAGNOSIS — I10 Essential (primary) hypertension: Secondary | ICD-10-CM | POA: Diagnosis not present

## 2021-10-12 ENCOUNTER — Ambulatory Visit (INDEPENDENT_AMBULATORY_CARE_PROVIDER_SITE_OTHER): Payer: Medicare Other | Admitting: Physician Assistant

## 2021-10-12 DIAGNOSIS — I739 Peripheral vascular disease, unspecified: Secondary | ICD-10-CM
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
## 2021-10-15 ENCOUNTER — Telehealth: Payer: Self-pay

## 2021-10-15 NOTE — Telephone Encounter (Signed)
Pt called stating that someone had tried to reach him on Tuesday 6/13, but he was unable to answer his phone.  Returned pt's call, two identifiers used. Pt stated that he had been confused about the phone visit he had scheduled with Arlee Muslim, PA to discuss his [place] results. He is now rescheduled for 7/5.

## 2021-10-17 DIAGNOSIS — R339 Retention of urine, unspecified: Secondary | ICD-10-CM | POA: Diagnosis not present

## 2021-10-21 ENCOUNTER — Telehealth: Payer: Self-pay

## 2021-10-21 NOTE — Telephone Encounter (Signed)
3rd attempt to reach pt to make sure his appt on 7/5 is in-person, no answer, vm full, Mychart msg sent.

## 2021-11-04 ENCOUNTER — Ambulatory Visit: Payer: Medicare Other

## 2021-11-06 DIAGNOSIS — I10 Essential (primary) hypertension: Secondary | ICD-10-CM | POA: Diagnosis not present

## 2021-11-10 DIAGNOSIS — I444 Left anterior fascicular block: Secondary | ICD-10-CM | POA: Diagnosis not present

## 2021-11-10 DIAGNOSIS — I10 Essential (primary) hypertension: Secondary | ICD-10-CM | POA: Diagnosis not present

## 2021-11-11 DIAGNOSIS — Z940 Kidney transplant status: Secondary | ICD-10-CM | POA: Diagnosis not present

## 2021-11-11 DIAGNOSIS — N186 End stage renal disease: Secondary | ICD-10-CM | POA: Diagnosis not present

## 2021-11-11 DIAGNOSIS — I160 Hypertensive urgency: Secondary | ICD-10-CM | POA: Diagnosis not present

## 2021-11-11 DIAGNOSIS — I120 Hypertensive chronic kidney disease with stage 5 chronic kidney disease or end stage renal disease: Secondary | ICD-10-CM | POA: Diagnosis not present

## 2021-11-11 DIAGNOSIS — R339 Retention of urine, unspecified: Secondary | ICD-10-CM | POA: Diagnosis not present

## 2021-11-11 DIAGNOSIS — I10 Essential (primary) hypertension: Secondary | ICD-10-CM | POA: Diagnosis not present

## 2021-11-11 DIAGNOSIS — I700 Atherosclerosis of aorta: Secondary | ICD-10-CM | POA: Diagnosis not present

## 2021-11-11 DIAGNOSIS — R109 Unspecified abdominal pain: Secondary | ICD-10-CM | POA: Diagnosis not present

## 2021-11-11 DIAGNOSIS — G8929 Other chronic pain: Secondary | ICD-10-CM | POA: Diagnosis not present

## 2021-11-11 DIAGNOSIS — M545 Low back pain, unspecified: Secondary | ICD-10-CM | POA: Diagnosis not present

## 2021-11-12 DIAGNOSIS — I10 Essential (primary) hypertension: Secondary | ICD-10-CM | POA: Diagnosis not present

## 2021-11-12 DIAGNOSIS — I120 Hypertensive chronic kidney disease with stage 5 chronic kidney disease or end stage renal disease: Secondary | ICD-10-CM | POA: Diagnosis not present

## 2021-11-12 DIAGNOSIS — R339 Retention of urine, unspecified: Secondary | ICD-10-CM | POA: Diagnosis not present

## 2021-11-12 DIAGNOSIS — Z940 Kidney transplant status: Secondary | ICD-10-CM | POA: Diagnosis not present

## 2021-11-12 DIAGNOSIS — E785 Hyperlipidemia, unspecified: Secondary | ICD-10-CM | POA: Diagnosis not present

## 2021-11-12 DIAGNOSIS — N186 End stage renal disease: Secondary | ICD-10-CM | POA: Diagnosis not present

## 2021-11-13 DIAGNOSIS — I10 Essential (primary) hypertension: Secondary | ICD-10-CM | POA: Diagnosis not present

## 2021-11-13 DIAGNOSIS — I120 Hypertensive chronic kidney disease with stage 5 chronic kidney disease or end stage renal disease: Secondary | ICD-10-CM | POA: Diagnosis not present

## 2021-11-13 DIAGNOSIS — E785 Hyperlipidemia, unspecified: Secondary | ICD-10-CM | POA: Diagnosis not present

## 2021-11-13 DIAGNOSIS — Z940 Kidney transplant status: Secondary | ICD-10-CM | POA: Diagnosis not present

## 2021-11-13 DIAGNOSIS — Z79899 Other long term (current) drug therapy: Secondary | ICD-10-CM | POA: Diagnosis not present

## 2021-11-13 DIAGNOSIS — N186 End stage renal disease: Secondary | ICD-10-CM | POA: Diagnosis not present

## 2021-11-13 DIAGNOSIS — M545 Low back pain, unspecified: Secondary | ICD-10-CM | POA: Diagnosis not present

## 2021-11-13 DIAGNOSIS — R339 Retention of urine, unspecified: Secondary | ICD-10-CM | POA: Diagnosis not present

## 2021-11-13 DIAGNOSIS — Z79621 Long term (current) use of calcineurin inhibitor: Secondary | ICD-10-CM | POA: Diagnosis not present

## 2021-11-14 DIAGNOSIS — E785 Hyperlipidemia, unspecified: Secondary | ICD-10-CM | POA: Diagnosis not present

## 2021-11-14 DIAGNOSIS — N186 End stage renal disease: Secondary | ICD-10-CM | POA: Diagnosis not present

## 2021-11-14 DIAGNOSIS — R339 Retention of urine, unspecified: Secondary | ICD-10-CM | POA: Diagnosis not present

## 2021-11-14 DIAGNOSIS — I120 Hypertensive chronic kidney disease with stage 5 chronic kidney disease or end stage renal disease: Secondary | ICD-10-CM | POA: Diagnosis not present

## 2021-11-15 DIAGNOSIS — Z940 Kidney transplant status: Secondary | ICD-10-CM | POA: Diagnosis not present

## 2021-11-15 DIAGNOSIS — M545 Low back pain, unspecified: Secondary | ICD-10-CM | POA: Diagnosis not present

## 2021-11-15 DIAGNOSIS — E785 Hyperlipidemia, unspecified: Secondary | ICD-10-CM | POA: Diagnosis not present

## 2021-11-15 DIAGNOSIS — N186 End stage renal disease: Secondary | ICD-10-CM | POA: Diagnosis not present

## 2021-11-15 DIAGNOSIS — R339 Retention of urine, unspecified: Secondary | ICD-10-CM | POA: Diagnosis not present

## 2021-11-15 DIAGNOSIS — Z79621 Long term (current) use of calcineurin inhibitor: Secondary | ICD-10-CM | POA: Diagnosis not present

## 2021-11-15 DIAGNOSIS — I120 Hypertensive chronic kidney disease with stage 5 chronic kidney disease or end stage renal disease: Secondary | ICD-10-CM | POA: Diagnosis not present

## 2021-11-15 DIAGNOSIS — Z79899 Other long term (current) drug therapy: Secondary | ICD-10-CM | POA: Diagnosis not present

## 2021-11-16 DIAGNOSIS — Z79621 Long term (current) use of calcineurin inhibitor: Secondary | ICD-10-CM | POA: Diagnosis not present

## 2021-11-16 DIAGNOSIS — I120 Hypertensive chronic kidney disease with stage 5 chronic kidney disease or end stage renal disease: Secondary | ICD-10-CM | POA: Diagnosis not present

## 2021-11-16 DIAGNOSIS — Z79899 Other long term (current) drug therapy: Secondary | ICD-10-CM | POA: Diagnosis not present

## 2021-11-16 DIAGNOSIS — E785 Hyperlipidemia, unspecified: Secondary | ICD-10-CM | POA: Diagnosis not present

## 2021-11-16 DIAGNOSIS — N186 End stage renal disease: Secondary | ICD-10-CM | POA: Diagnosis not present

## 2021-11-16 DIAGNOSIS — M545 Low back pain, unspecified: Secondary | ICD-10-CM | POA: Diagnosis not present

## 2021-11-16 DIAGNOSIS — Z940 Kidney transplant status: Secondary | ICD-10-CM | POA: Diagnosis not present

## 2021-11-17 DIAGNOSIS — I10 Essential (primary) hypertension: Secondary | ICD-10-CM | POA: Diagnosis not present

## 2021-11-17 DIAGNOSIS — R109 Unspecified abdominal pain: Secondary | ICD-10-CM | POA: Diagnosis not present

## 2021-11-17 DIAGNOSIS — Z720 Tobacco use: Secondary | ICD-10-CM | POA: Diagnosis not present

## 2021-11-17 DIAGNOSIS — M545 Low back pain, unspecified: Secondary | ICD-10-CM | POA: Diagnosis not present

## 2021-11-17 DIAGNOSIS — F1090 Alcohol use, unspecified, uncomplicated: Secondary | ICD-10-CM | POA: Diagnosis not present

## 2021-11-17 DIAGNOSIS — Z940 Kidney transplant status: Secondary | ICD-10-CM | POA: Diagnosis not present

## 2021-11-23 NOTE — Progress Notes (Unsigned)
HISTORY AND PHYSICAL     CC:  follow up. Requesting Provider:  Benito Mccreedy, MD  HPI: This is a 66 y.o. male who is here today for follow up for PAD.   He was previously under the care of Dr. Bridgett Larsson and had bilateral common iliac stents in 2015.  He also has a left common femoral to below-knee popliteal bypass with non-reversed ipsilateral vein done on 04/24/2013.  And on the right he had an iliofemoral endarterectomy with a right common femoral to below-knee pop bypass with non-reversed ipsilateral saphenous vein in 2016.   Pt was last seen 08/25/2021 and at that time, He was having issues with BLE swelling. He had been evaluated by duplex and has known venous insufficiency bilaterally. He had been wearing knee high compression stockings and elevating every night with 4 pillows. He was wearing his stockings 4-5 days a week. His swelling was worse over past 6 months. He did report being more active and walking a lot more recently. He was also having pain on ambulation L> R leg. He gets tightness and pulling in his left calf on ambulation. This was improved with rest. He felt this was similar to his procedures in the past. He did not have any pain at rest. No tissue loss.    S/p kidney transplant. This is functioning well per patient   The pt returns today for follow up.  He had follow up studies in May and was supposed to be seen but did not make that appt.  The office reached out to him as it was felt he needed angiography based on his studies but could not reach the pt.  He has been rescheduled several times.  Per note, Patient has a transplanted cadaveric kidney on the right iliac artery and some level of CKD.  He will need to hydrate as well as be given 500-1000cc of normal saline in preop holding prior to angiography.  We will likely access the right common femoral artery to to focus the symptomatic left lower extremity.  This will have to be done with CO2 contrast.  This may need to be  performed under general anesthesia due to his inability to tolerate a previous trip to the cath lab.    ***  The pt is on a statin for cholesterol management.    The pt is not on an aspirin.    Other AC:  none The pt is on BB, CCB for hypertension.  The pt does not have diabetes. Tobacco hx:  former  Pt does *** have family hx of AAA.  Past Medical History:  Diagnosis Date   ADHD    Anemia    pt. not sure, but reports he is taking Fe for one month now.   Aorto-iliac disease ([place])    Bladder dysfunction    ESRD (end stage renal disease) ([place])    transplant- [facility] , followed by Dr. Moshe Cipro    GERD (gastroesophageal reflux disease)    Hemodialysis patient [facility])    "on dialysis 1998-2010" 04/25/2013)   Hepatitis C    has been treated with Harvoni   History of renal failure    Hypercholesteremia    Hypertension    Internal hemorrhoids    Colonoscopy 2010 and anoscopy 2015   Peripheral vascular disease ([place])    Pneumonia    Self-catheterizes urinary bladder    pt. choice, but he reports that he desires to do this to avoid retention  Past Surgical History:  Procedure Laterality Date   ABDOMINAL AORTAGRAM N/A 03/09/2013   Procedure: ABDOMINAL AORTAGRAM;  Surgeon: Elam Dutch, MD;  Location: [facility] CATH LAB;  Service: Cardiovascular;  Laterality: N/A;   AV FISTULA PLACEMENT Left 1998   "removed 2011" (04/25/2013)   AV FISTULA REPAIR Left    removal with partial vein removed   COLONOSCOPY     ENDARTERECTOMY FEMORAL Right 04/17/2015   Procedure: ENDARTERECTOMY RIGHT ILIOFEMORAL WITH PROFUNDOPLASTY;  Surgeon: Conrad Northumberland, MD;  Location: [place];  Service: Vascular;  Laterality: Right;   ENDARTERECTOMY FEMORAL Right 10/25/2016   Procedure: RIGHT ILIOFEMORAL ENDARTERECTOMY WITH BOVINE PERICARDIAL PATCH ANGIOPLASTY;  Surgeon: Conrad Honaker, MD;  Location: [place];  Service: Vascular;  Laterality: Right;   ESOPHAGOGASTRODUODENOSCOPY (EGD) WITH PROPOFOL N/A  02/05/2019   Procedure: ESOPHAGOGASTRODUODENOSCOPY (EGD) WITH PROPOFOL;  Surgeon: Clarene Essex, MD;  Location: [facility] ENDOSCOPY;  Service: Endoscopy;  Laterality: N/A;   FEMORAL-POPLITEAL BYPASS GRAFT Left 04/24/2013   Procedure: BYPASS GRAFT COMMON FEMORALARTERY-BELOW KNEE POPLITEAL ARTERY WITH GREATER SAPHENOUS VEIN;  Surgeon: Conrad Millbrook, MD;  Location: [place];  Service: Vascular;  Laterality: Left;   FEMORAL-POPLITEAL BYPASS GRAFT Right 04/17/2015   Procedure: BYPASS GRAFT RIGHT FEMORALTO BELOW KNEE POPLITEAL ARTERY USING RIGHT NON REVERSED GREATER [place];  Surgeon: Conrad Westwego, MD;  Location: [place];  Service: Vascular;  Laterality: Right;   HERNIA REPAIR  08/2019   INGUINAL HERNIA REPAIR Left 08/29/2019   Procedure: LEFT INGUINAL HERNIA REPAIR WITH MESH;  Surgeon: Erroll Luna, MD;  Location: [place];  Service: General;  Laterality: Left;   INTRAOPERATIVE ARTERIOGRAM Left 04/24/2013   Procedure: INTRA OPERATIVE ARTERIOGRAM;  Surgeon: Conrad Tolu, MD;  Location: [place];  Service: Vascular;  Laterality: Left;   KIDNEY TRANSPLANT     LOWER EXTREMITY ANGIOGRAM Bilateral 03/09/2013   Procedure: LOWER EXTREMITY ANGIOGRAM;  Surgeon: Elam Dutch, MD;  Location: [facility] CATH LAB;  Service: Cardiovascular;  Laterality: Bilateral;   LOWER EXTREMITY ANGIOGRAM Right 10/25/2013   Procedure: LOWER EXTREMITY ANGIOGRAM;  Surgeon: Conrad [place]haven, MD;  Location: [facility] CATH LAB;  Service: Cardiovascular;  Laterality: Right;   LOWER EXTREMITY ANGIOGRAM Right 12/13/2013   Procedure: LOWER EXTREMITY ANGIOGRAM;  Surgeon: Conrad [place], MD;  Location: [facility] CATH LAB;  Service: Cardiovascular;  Laterality: Right;   NEPHRECTOMY RECIPIENT  2010   NEPHRECTOMY TRANSPLANTED ORGAN     PATCH ANGIOPLASTY Right 04/17/2015   Procedure: Rueben Bash PATCH ANGIOPLASTY, RIGHT ILEOFEMORAL;  Surgeon: Conrad Cecilia, MD;  Location: [place];  Service: Vascular;  Laterality: Right;   PERIPHERAL VASCULAR CATHETERIZATION N/A  04/10/2015   Procedure: Abdominal Aortogram;  Surgeon: Conrad Ironton, MD;  Location: [place] CV LAB;  Service: Cardiovascular;  Laterality: N/A;   PERIPHERAL VASCULAR CATHETERIZATION N/A 05/10/2016   Procedure: Abdominal Aortogram w/Lower Extremity;  Surgeon: Conrad [place]own, MD;  Location: [place] CV LAB;  Service: Cardiovascular;  Laterality: N/A;   s/p cadaveric renal transplant  December 2010   [place] W/ EMBOLECTOMY Left 09/24/2015   Procedure: EXCISION OF THROMBOSED RADIOCEPHALIC ARTERIOVENOUS FISTULA;  Surgeon: Conrad , MD;  Location: [place];  Service: Vascular;  Laterality: Left;   UMBILICAL HERNIA REPAIR N/A 08/29/2019   Procedure: UMBILICAL HERNIA REPAIR WITH MESH;  Surgeon: Erroll Luna, MD;  Location: [place];  Service: General;  Laterality: N/A;   VEIN HARVEST Right 04/17/2015   Procedure: RIGHT GREATER [place] ;  Surgeon: Conrad [place], MD;  Location: [place];  Service: Vascular;  Laterality: Right;    Allergies  Allergen Reactions   Tape Rash and Other (See Comments)    NO Silk tape, please!!    Current Outpatient Medications  Medication Sig Dispense Refill   albuterol (PROVENTIL HFA;VENTOLIN HFA) 108 (90 Base) MCG/ACT inhaler Inhale 1-2 puffs into the lungs every 6 (six) hours as needed for wheezing or shortness of breath. 1 Inhaler 0   citalopram (CELEXA) 40 MG tablet Take 20 mg by mouth 2 (two) times daily.   2   Cyanocobalamin (VITAMIN B-12 PO) Take 1 tablet by mouth daily.     ferrous sulfate 325 (65 FE) MG EC tablet Take 1 tablet (325 mg total) by mouth 2 (two) times daily. (Patient taking differently: Take 325 mg by mouth 2 (two) times daily. Taking it PRN) 90 tablet 3   ketotifen (ZADITOR) 0.025 % ophthalmic solution Place 1 drop into both eyes 2 (two) times daily as needed (for seasonal allergies). (Patient not taking: Reported on 08/25/2021)     lisdexamfetamine (VYVANSE) 20 MG capsule Take 20 mg by mouth 2  (two) times daily.      metoprolol succinate (TOPROL-XL) 25 MG 24 hr tablet Take 25 mg by mouth 2 (two) times daily.   2   mycophenolate (CELLCEPT) 250 MG capsule Take 250-500 mg by mouth See admin instructions. Take 500 mg by mouth in the morning and 250 mg in the evening     NIFEdipine (ADALAT CC) 30 MG 24 hr tablet Take 30 mg by mouth at bedtime.     pantoprazole (PROTONIX) 40 MG tablet Take 40 mg by mouth daily.     Pyridoxine HCl (VITAMIN B-6 PO) Take 1 tablet by mouth 3 (three) times a week.      rosuvastatin (CRESTOR) 5 MG tablet TAKE 1 TABLET BY MOUTH EVERYDAY AT BEDTIME 90 tablet 2   tacrolimus (PROGRAF) 1 MG capsule Take 3-4 mg by mouth 2 (two) times daily. 4 mg in the morning and 3 mg in the evening     tadalafil (CIALIS) 20 MG tablet Take 20 mg by mouth daily as needed for erectile dysfunction.     tamsulosin (FLOMAX) 0.4 MG CAPS capsule Take 0.4 mg by mouth daily.     zolpidem (AMBIEN) 10 MG tablet Take 5 mg by mouth at bedtime.     No current facility-administered medications for this visit.    Family History  Problem Relation Age of Onset   Hypertension Father    Other Father        amputation   Hypertension Mother     Social History   Socioeconomic History   Marital status: Single    Spouse name: Not on file   Number of children: 3   Years of education: Not on file   Highest education level: Not on file  Occupational History   Occupation: student   Occupation: [employer] driver  Tobacco Use   Smoking status: Former    Packs/day: 0.50    Years: 34.00    Total pack years: 17.00    Types: E-cigarettes, Cigarettes    Quit date: 02/17/2003    Years since quitting: 18.7   Smokeless tobacco: Former   Tobacco comments:    vapor cigrarette  Vaping Use   Vaping Use: Every day  Substance and Sexual Activity   Alcohol use: Not Currently    Alcohol/week: 0.0 standard drinks of alcohol   Drug use: Not  Currently    Types: Cocaine, Marijuana, Heroin, Other-see comments     Comment: pt states used TCH a few weeks ago/ 04/25/2013 "abused in the past ; stopped  11/13/2002.....states smokes marijuana again 2021   Sexual activity: Not on file  Other Topics Concern   Not on file  Social History Narrative   Patient reports he is divorced. 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   On disability otherwise, he is a vapor smoker, 3 caffeinated beverages daily   Social Determinants of Health   Financial Resource Strain: Not on file  Food Insecurity: Not on file  Transportation Needs: Not on file  Physical Activity: Not on file  Stress: Not on file  Social Connections: Not on file  Intimate Partner Violence: Not on file     REVIEW OF SYSTEMS:  *** '[X]'$  denotes positive finding, '[ ]'$  denotes negative finding Cardiac  Comments:  Chest pain or chest pressure:    Shortness of breath upon exertion:    Short of breath when lying flat:    Irregular heart rhythm:        Vascular    Pain in calf, thigh, or hip brought on by ambulation:    Pain in feet at night that wakes you up from your sleep:     Blood clot in your veins:    Leg swelling:         Pulmonary    Oxygen at home:    Productive cough:     Wheezing:         Neurologic    Sudden weakness in arms or legs:     Sudden numbness in arms or legs:     Sudden onset of difficulty speaking or slurred speech:    Temporary loss of vision in one eye:     Problems with dizziness:         Gastrointestinal    Blood in stool:     Vomited blood:         Genitourinary    Burning when urinating:     Blood in urine:        Psychiatric    Major depression:         Hematologic    Bleeding problems:    Problems with blood clotting too easily:        Skin    Rashes or ulcers:        Constitutional    Fever or chills:      PHYSICAL EXAMINATION:  ***  General:  WDWN in NAD; vital signs documented above Gait: Not observed HENT: WNL, normocephalic Pulmonary: normal non-labored breathing ,  without wheezing Cardiac: {Desc; regular/irreg:14544} HR, {With/Without:20273} carotid bruit*** Abdomen: soft, NT, no masses; aortic pulse is *** palpable Skin: {With/Without:20273} rashes Vascular Exam/Pulses:  Right Left  Radial {Exam; arterial pulse strength 0-4:30167} {Exam; arterial pulse strength 0-4:30167}  Femoral {Exam; arterial pulse strength 0-4:30167} {Exam; arterial pulse strength 0-4:30167}  Popliteal {Exam; arterial pulse strength 0-4:30167} {Exam; arterial pulse strength 0-4:30167}  DP {Exam; arterial pulse strength 0-4:30167} {Exam; arterial pulse strength 0-4:30167}  PT {Exam; arterial pulse strength 0-4:30167} {Exam; arterial pulse strength 0-4:30167}  Peroneal *** ***   Extremities: {With/Without:20273} ischemic changes, {With/Without:20273} Gangrene , {With/Without:20273} cellulitis; {With/Without:20273} open wounds Musculoskeletal: no muscle wasting or atrophy  Neurologic: A&O X 3 Psychiatric:  The pt has {Desc; normal/abnormal:11317::"Normal"} affect.   Non-Invasive Vascular Imaging:   ABI's/TBI's on 09/09/2021: Right:  0.62/0.31 - Great  toe pressure: 39 Left:  0.85/0.44 - Great toe pressure: 55  Arterial duplex on 09/09/2021: Right Graft #1: Femoral-popliteal  +------------------+--------+--------+----------+--------+                    PSV cm/sStenosisWaveform  Comments  +------------------+--------+--------+----------+--------+  Inflow            121             biphasic            +------------------+--------+--------+----------+--------+  Prox Anastomosis  116             biphasic            +------------------+--------+--------+----------+--------+  Proximal Graft    99              biphasic            +------------------+--------+--------+----------+--------+  Mid Graft         36              biphasic            +------------------+--------+--------+----------+--------+  Distal Graft      40              biphasic             +------------------+--------+--------+----------+--------+  Distal Anastomosis41              biphasic            +------------------+--------+--------+----------+--------+  Outflow           43              monophasic          +------------------+--------+--------+----------+--------+   Left Graft #1: Femoral-popliteal  +--------------------+--------+--------+----------+--------+                      PSV cm/sStenosisWaveform  Comments  +--------------------+--------+--------+----------+--------+  Inflow              106             biphasic            +--------------------+--------+--------+----------+--------+  Proximal Anastomosis50              biphasic            +--------------------+--------+--------+----------+--------+  Proximal Graft      52              biphasic            +--------------------+--------+--------+----------+--------+  Mid Graft           64              biphasic            +--------------------+--------+--------+----------+--------+  Distal Graft        50              biphasic            +--------------------+--------+--------+----------+--------+  Distal Anastomosis  58              biphasic            +--------------------+--------+--------+----------+--------+  Outflow             126             monophasic          +--------------------+--------+--------+----------+--------+   Abdominal Aorta Findings:  +-------------+------+----------+---------+--------+--------+--------------  ----+  Location     AP    Trans (cm)PSV  WaveformThrombusComments                      (cm)            (cm/s)                                   +-------------+------+----------+---------+--------+--------+--------------  Proximal                     104                                      +-------------+------+----------+---------+--------+--------+--------------  Mid                           95                                           +-------------+------+----------+---------+--------+--------+--------------  Distal                       81                                       +-------------+------+----------+---------+--------+--------+--------------  RT CIA Prox                  336                      stent          +-------------+------+----------+---------+--------+--------+--------------  RT CIA Mid                   268                      stent          +-------------+------+----------+---------+--------+--------+--------------  RT CIA Distal                349                      stent          +-------------+------+----------+---------+--------+--------+--------------  RT EIA Prox                  283                                      +-------------+------+----------+---------+--------+--------+--------------  RT EIA Mid                   244                                      +-------------+------+----------+---------+--------+--------+--------------  RT EIA Distal                147                                    +-------------+------+----------+---------+--------+--------+--------------  LT CIA Prox                                           unable to visualize      +-------------+------+----------+---------+--------+--------+--------------   LT CIA Mid                   141                      stent          +-------------+------+----------+---------+--------+--------+--------------  LT CIA Distal                175                      stent          +-------------+------+----------+---------+--------+--------+--------------  LT EIA Prox                  105                                      +-------------+------+----------+---------+--------+--------+--------------  LT EIA Mid                   118                                       +-------------+------+----------+---------+--------+--------+--------------  LT EIA Distal                474                                      +-------------+------+----------+---------+--------+--------+--------------   Previous ABI's/TBI's on 09/09/2020: Right:  0.84/0.50 - Great toe pressure: 90 Left:  0.89/0.70 - Great toe pressure:  126  Previous arterial duplex on 09/09/2020: Right Graft #1:  +------------------+--------+--------+----------+--------+                    PSV cm/sStenosisWaveform  Comments  +------------------+--------+--------+----------+--------+  Inflow            110             biphasic            +------------------+--------+--------+----------+--------+  Prox Anastomosis  174             biphasic            +------------------+--------+--------+----------+--------+  Proximal Graft    55              biphasic            +------------------+--------+--------+----------+--------+  Mid Graft         55              biphasic            +------------------+--------+--------+----------+--------+  Distal Graft      35              biphasic            +------------------+--------+--------+----------+--------+  Distal Anastomosis62              biphasic            +------------------+--------+--------+----------+--------+  Outflow           40              monophasic          +------------------+--------+--------+----------+--------+   Mid anterior shin area of concern: cystic structure, does not appear to be  a vein/varicosity. Measures 1.25 cm.    Left Graft #1: +--------------------+--------+--------+--------+--------+                      PSV cm/sStenosisWaveformComments  +--------------------+--------+--------+--------+--------+  Inflow              81              biphasic          +--------------------+--------+--------+--------+--------+  Proximal Anastomosis40               biphasic          +--------------------+--------+--------+--------+--------+  Proximal Graft      47              biphasic          +--------------------+--------+--------+--------+--------+  Mid Graft           40              biphasic          +--------------------+--------+--------+--------+--------+  Distal Graft        50              biphasic          +--------------------+--------+--------+--------+--------+  Distal Anastomosis  46              biphasic          +--------------------+--------+--------+--------+--------+  Outflow             106             biphasic          +--------------------+--------+--------+--------+--------+   Abdominal Aorta Findings:  +-------------+-------+----------+----------+--------+--------+--------+  Location     AP (cm)Trans (cm)PSV (cm/s)WaveformThrombusComments  +-------------+-------+----------+----------+--------+--------+--------+  Proximal     1.60             89                                  +-------------+-------+----------+----------+--------+--------+--------+  Mid          1.81             122                                 +-------------+-------+----------+----------+--------+--------+--------+  Distal       1.82             114                                 +-------------+-------+----------+----------+--------+--------+--------+  RT CIA Prox                   114                       stent     +-------------+-------+----------+----------+--------+--------+--------+  RT CIA Mid                    129  stent     +-------------+-------+----------+----------+--------+--------+--------+  RT CIA Distal                 230                       stent     +-------------+-------+----------+----------+--------+--------+--------+  RT EIA Prox                   275                                  +-------------+-------+----------+----------+--------+--------+--------+  RT EIA Mid                    258                                 +-------------+-------+----------+----------+--------+--------+--------+  RT EIA Distal                 140                                 +-------------+-------+----------+----------+--------+--------+--------+  LT CIA Prox                   116                       stent     +-------------+-------+----------+----------+--------+--------+--------+  LT CIA Mid                    147                       stent     +-------------+-------+----------+----------+--------+--------+--------+  LT CIA Distal                 188                       stent     +-------------+-------+----------+----------+--------+--------+--------+  LT EIA Prox                   125                                 +-------------+-------+----------+----------+--------+--------+--------+  LT EIA Mid                    115                                 +-------------+-------+----------+----------+--------+--------+--------+  LT EIA Distal                 467                                 +-------------+-------+----------+----------+--------+--------+--------+   Transplant kidney anastomosis to right iliac artery- 239 cm/s. Patent.    ASSESSMENT/PLAN:: 66 y.o. male here for follow up for PAD with hx of ***  -*** -continue *** -pt will f/u in *** with ***.   Leontine Locket, [facility] [phone]  Clinic MD:   ***

## 2021-11-24 ENCOUNTER — Ambulatory Visit (INDEPENDENT_AMBULATORY_CARE_PROVIDER_SITE_OTHER): Payer: Medicare Other | Admitting: Physician Assistant

## 2021-11-24 ENCOUNTER — Other Ambulatory Visit: Payer: Self-pay

## 2021-11-24 ENCOUNTER — Encounter: Payer: Self-pay | Admitting: Physician Assistant

## 2021-11-24 VITALS — BP 146/82 | HR 62 | Temp 98.0°F | Resp 20 | Ht 70.0 in | Wt 133.1 lb

## 2021-11-24 DIAGNOSIS — I739 Peripheral vascular disease, unspecified: Secondary | ICD-10-CM

## 2021-11-28 DIAGNOSIS — R339 Retention of urine, unspecified: Secondary | ICD-10-CM | POA: Diagnosis not present

## 2021-11-30 DIAGNOSIS — Z940 Kidney transplant status: Secondary | ICD-10-CM | POA: Diagnosis not present

## 2021-12-03 ENCOUNTER — Encounter (HOSPITAL_COMMUNITY)
Admission: RE | Disposition: A | Discharge status: Home / Self Care | Payer: Self-pay | Source: Home / Self Care | Attending: Vascular Surgery

## 2021-12-03 ENCOUNTER — Ambulatory Visit (HOSPITAL_COMMUNITY)
Admission: RE | Admit: 2021-12-03 | Discharge: 2021-12-03 | Disposition: A | Discharge status: Home / Self Care | Payer: Medicare Other | Attending: Vascular Surgery | Admitting: Vascular Surgery

## 2021-12-03 ENCOUNTER — Other Ambulatory Visit: Payer: Self-pay

## 2021-12-03 DIAGNOSIS — Z940 Kidney transplant status: Secondary | ICD-10-CM | POA: Insufficient documentation

## 2021-12-03 DIAGNOSIS — I129 Hypertensive chronic kidney disease with stage 1 through stage 4 chronic kidney disease, or unspecified chronic kidney disease: Secondary | ICD-10-CM | POA: Diagnosis not present

## 2021-12-03 DIAGNOSIS — T82858A Stenosis of vascular prosthetic devices, implants and grafts, initial encounter: Secondary | ICD-10-CM | POA: Diagnosis not present

## 2021-12-03 DIAGNOSIS — Z95820 Peripheral vascular angioplasty status with implants and grafts: Secondary | ICD-10-CM | POA: Diagnosis not present

## 2021-12-03 DIAGNOSIS — N189 Chronic kidney disease, unspecified: Secondary | ICD-10-CM | POA: Insufficient documentation

## 2021-12-03 DIAGNOSIS — I739 Peripheral vascular disease, unspecified: Secondary | ICD-10-CM | POA: Insufficient documentation

## 2021-12-03 DIAGNOSIS — Z87891 Personal history of nicotine dependence: Secondary | ICD-10-CM | POA: Diagnosis not present

## 2021-12-03 HISTORY — PX: PERIPHERAL VASCULAR INTERVENTION: CATH118257

## 2021-12-03 HISTORY — PX: ABDOMINAL AORTOGRAM W/LOWER EXTREMITY: CATH118223

## 2021-12-03 LAB — POCT I-STAT, CHEM 8
BUN: 12 mg/dL (ref 8–23)
Calcium, Ion: 1.34 mmol/L (ref 1.15–1.40)
Chloride: 106 mmol/L (ref 98–111)
Creatinine, Ser: 1 mg/dL (ref 0.61–1.24)
Glucose, Bld: 100 mg/dL — ABNORMAL HIGH (ref 70–99)
HCT: 37 % — ABNORMAL LOW (ref 39.0–52.0)
Hemoglobin: 12.6 g/dL — ABNORMAL LOW (ref 13.0–17.0)
Potassium: 3.8 mmol/L (ref 3.5–5.1)
Sodium: 142 mmol/L (ref 135–145)
TCO2: 23 mmol/L (ref 22–32)

## 2021-12-03 LAB — POCT ACTIVATED CLOTTING TIME
Activated Clotting Time: 227 seconds
Activated Clotting Time: 179 seconds

## 2021-12-03 SURGERY — ABDOMINAL AORTOGRAM W/LOWER EXTREMITY
Anesthesia: LOCAL

## 2021-12-03 MED ORDER — HEPARIN (PORCINE) IN NACL 1000-0.9 UT/500ML-% IV SOLN
INTRAVENOUS | Status: DC | PRN
  Administered 2021-12-03 (×2): 500 mL

## 2021-12-03 MED ORDER — MORPHINE SULFATE (PF) 2 MG/ML IV SOLN
INTRAVENOUS | Status: AC
  Filled 2021-12-03: qty 1

## 2021-12-03 MED ORDER — DIPHENHYDRAMINE HCL 50 MG/ML IJ SOLN
INTRAMUSCULAR | Status: DC | PRN
  Administered 2021-12-03: 25 mg via INTRAVENOUS

## 2021-12-03 MED ORDER — HEPARIN SODIUM (PORCINE) 1000 UNIT/ML IJ SOLN
INTRAMUSCULAR | Status: DC | PRN
  Administered 2021-12-03: 7000 [IU] via INTRAVENOUS

## 2021-12-03 MED ORDER — SODIUM CHLORIDE 0.9 % IV SOLN: INTRAVENOUS | Status: DC

## 2021-12-03 MED ORDER — SODIUM CHLORIDE 0.9% FLUSH: 3.0000 mL | Freq: Two times a day (BID) | INTRAVENOUS | Status: DC

## 2021-12-03 MED ORDER — LABETALOL HCL 5 MG/ML IV SOLN
INTRAVENOUS | Status: AC
  Filled 2021-12-03: qty 4

## 2021-12-03 MED ORDER — CLOPIDOGREL BISULFATE 300 MG PO TABS
ORAL_TABLET | ORAL | Status: DC | PRN
  Administered 2021-12-03: 300 mg via ORAL

## 2021-12-03 MED ORDER — CLOPIDOGREL BISULFATE 75 MG PO TABS: 300.0000 mg | ORAL_TABLET | Freq: Once | ORAL | Status: DC

## 2021-12-03 MED ORDER — LIDOCAINE HCL (PF) 1 % IJ SOLN
INTRAMUSCULAR | Status: DC | PRN
  Administered 2021-12-03: 10 mL

## 2021-12-03 MED ORDER — LABETALOL HCL 5 MG/ML IV SOLN: 10.0000 mg | INTRAVENOUS | Status: DC | PRN

## 2021-12-03 MED ORDER — CLOPIDOGREL BISULFATE 75 MG PO TABS: 75.0000 mg | ORAL_TABLET | Freq: Every day | ORAL | Status: DC

## 2021-12-03 MED ORDER — DIPHENHYDRAMINE HCL 50 MG/ML IJ SOLN
INTRAMUSCULAR | Status: AC
  Filled 2021-12-03: qty 1

## 2021-12-03 MED ORDER — ASPIRIN 81 MG PO TBEC: 81.0000 mg | DELAYED_RELEASE_TABLET | Freq: Every day | ORAL | 2 refills | Status: AC

## 2021-12-03 MED ORDER — CLOPIDOGREL BISULFATE 75 MG PO TABS: 75.0000 mg | ORAL_TABLET | Freq: Every day | ORAL | 11 refills | Status: DC

## 2021-12-03 MED ORDER — ONDANSETRON HCL 4 MG/2ML IJ SOLN: 4.0000 mg | Freq: Four times a day (QID) | INTRAMUSCULAR | Status: DC | PRN

## 2021-12-03 MED ORDER — MIDAZOLAM HCL 2 MG/2ML IJ SOLN
INTRAMUSCULAR | Status: DC | PRN
  Administered 2021-12-03 (×2): 1 mg via INTRAVENOUS

## 2021-12-03 MED ORDER — ONDANSETRON HCL 4 MG/2ML IJ SOLN
INTRAMUSCULAR | Status: AC
  Filled 2021-12-03: qty 2

## 2021-12-03 MED ORDER — FENTANYL CITRATE (PF) 100 MCG/2ML IJ SOLN
INTRAMUSCULAR | Status: AC
  Filled 2021-12-03: qty 2

## 2021-12-03 MED ORDER — SODIUM CHLORIDE 0.9 % IV SOLN
250.0000 mL | INTRAVENOUS | Status: DC | PRN
Start: 2021-12-03 — End: 2021-12-03

## 2021-12-03 MED ORDER — ACETAMINOPHEN 325 MG PO TABS: 650.0000 mg | ORAL_TABLET | ORAL | Status: DC | PRN

## 2021-12-03 MED ORDER — ASPIRIN 81 MG PO TBEC: 81.0000 mg | DELAYED_RELEASE_TABLET | Freq: Every day | ORAL | Status: DC

## 2021-12-03 MED ORDER — MORPHINE SULFATE (PF) 2 MG/ML IV SOLN
2.0000 mg | Freq: Once | INTRAVENOUS | Status: AC
  Administered 2021-12-03: 2 mg via INTRAVENOUS

## 2021-12-03 MED ORDER — MIDAZOLAM HCL 2 MG/2ML IJ SOLN
INTRAMUSCULAR | Status: AC
  Filled 2021-12-03: qty 2

## 2021-12-03 MED ORDER — ONDANSETRON HCL 4 MG/2ML IJ SOLN
INTRAMUSCULAR | Status: DC | PRN
  Administered 2021-12-03: 4 mg via INTRAVENOUS

## 2021-12-03 MED ORDER — SODIUM CHLORIDE 0.9% FLUSH: 3.0000 mL | INTRAVENOUS | Status: DC | PRN

## 2021-12-03 MED ORDER — ASPIRIN 81 MG PO CHEW
CHEWABLE_TABLET | ORAL | Status: DC | PRN
  Administered 2021-12-03: 81 mg via ORAL

## 2021-12-03 MED ORDER — FENTANYL CITRATE (PF) 100 MCG/2ML IJ SOLN
INTRAMUSCULAR | Status: DC | PRN
  Administered 2021-12-03 (×2): 25 ug via INTRAVENOUS

## 2021-12-03 MED ORDER — ASPIRIN 81 MG PO CHEW
CHEWABLE_TABLET | ORAL | Status: AC
  Filled 2021-12-03: qty 1

## 2021-12-03 MED ORDER — HYDRALAZINE HCL 20 MG/ML IJ SOLN: 5.0000 mg | INTRAMUSCULAR | Status: DC | PRN

## 2021-12-03 MED ORDER — CLOPIDOGREL BISULFATE 300 MG PO TABS
ORAL_TABLET | ORAL | Status: AC
  Filled 2021-12-03: qty 1

## 2021-12-03 SURGICAL SUPPLY — 17 items
BALLN MUSTANG 7.0X40 135 (BALLOONS) ×3
BALLOON MUSTANG 7.0X40 135 (BALLOONS) IMPLANT
CATH OMNI FLUSH 5F 65CM (CATHETERS) ×1 IMPLANT
CATH STRAIGHT 5FR 65CM (CATHETERS) ×1 IMPLANT
GLIDEWIRE ADV .035X260CM (WIRE) ×1 IMPLANT
KIT ANGIASSIST CO2 SYSTEM (KITS) ×1 IMPLANT
KIT ENCORE 26 ADVANTAGE (KITS) ×1 IMPLANT
KIT MICROPUNCTURE NIT STIFF (SHEATH) ×1 IMPLANT
KIT PV (KITS) ×3 IMPLANT
SHEATH FLEXOR ANSEL 1 7F 45CM (SHEATH) ×1 IMPLANT
SHEATH PINNACLE 5F 10CM (SHEATH) ×1 IMPLANT
STENT INNOVA 8X40X130 (Permanent Stent) ×1 IMPLANT
STOPCOCK MORSE 400PSI 3WAY (MISCELLANEOUS) ×1 IMPLANT
SYR MEDRAD MARK 7 150ML (SYRINGE) ×3 IMPLANT
TRANSDUCER W/STOPCOCK (MISCELLANEOUS) ×3 IMPLANT
TRAY PV CATH (CUSTOM PROCEDURE TRAY) ×3 IMPLANT
WIRE BENTSON .035X145CM (WIRE) ×1 IMPLANT

## 2021-12-03 NOTE — Discharge Instructions (Signed)
Femoral Site Care This sheet gives you information about how to care for yourself after your procedure. Your health care provider may also give you more specific instructions. If you have problems or questions, contact your health care provider. What can I expect after the procedure? After the procedure, it is common to have: Bruising that usually fades within 1-2 weeks. Tenderness at the site. Follow these instructions at home: Wound care May remove bandage after 24 hours. Do not take baths, swim, or use a hot tub for 5 days. You may shower 24-48 hours after the procedure. Gently wash the site with plain soap and water. Pat the area dry with a clean towel. Do not rub the site. This may cause bleeding. Do not apply powder or lotion to the site. Keep the site clean and dry. Check your femoral site every day for signs of infection. Check for: Redness, swelling, or pain. Fluid or blood. Warmth. Pus or a bad smell. Activity For the first 2-3 days after your procedure, or as long as directed: Avoid climbing stairs as much as possible. Do not squat. Do not lift, push or pull anything that is heavier than 10 lb for 5 days. Rest as directed. Avoid sitting for a long time without moving. Get up to take short walks every 1-2 hours. Do not drive for 24 hours. General instructions Take over-the-counter and prescription medicines only as told by your health care provider. Keep all follow-up visits as told by your health care provider. This is important. DRINK PLENTY OF FLUIDS FOR THE NEXT 2-3 DAYS. Contact a health care provider if you have: A fever or chills. You have redness, swelling, or pain around your insertion site. Get help right away if: The catheter insertion area swells very fast. You pass out. You suddenly start to sweat or your skin gets clammy. The catheter insertion area is bleeding, and the bleeding does not stop when you hold steady pressure on the area. The area near or  just beyond the catheter insertion site becomes pale, cool, tingly, or numb. These symptoms may represent a serious problem that is an emergency. Do not wait to see if the symptoms will go away. Get medical help right away. Call your local emergency services (911 in the [place] Do not drive yourself to the hospital. Summary After the procedure, it is common to have bruising that usually fades within 1-2 weeks. Check your femoral site every day for signs of infection. Do not lift, push or pull anything that is heavier than 10 lb for 5 days.  This information is not intended to replace advice given to you by your health care provider. Make sure you discuss any questions you have with your health care provider. Document Revised: 05/02/2017 Document Reviewed: 05/02/2017 Elsevier Patient Education  2020 [employer]  

## 2021-12-03 NOTE — Progress Notes (Signed)
I was called into the room by pt.  Upon entering the room, I found pt standing on the side of his bed dressed and taking off all his monitor devices.  I asked pt what he was doing and he said he was leaving.  "I have something very important to do that I just remembered."  I explained to pt that this was very important and all the risks of him leaving AMA.  Pt stated that he "had this many times before and I know what to do "  Pt states that " I am not going to bleed"  Pt agreed to and signed AMA papers.  Emergency care and discharge instructions gone over with pt and friend. Dr Carlis Abbott and charge nurse notified.  Pt and friend walked out

## 2021-12-03 NOTE — Progress Notes (Signed)
The RN taking care of Mr Stracener stated pt was dressed and leaving after 1 hour of bedrest. Paged Carlis Abbott, MD to inform him. He was scrubbed in another case but the message was relayed by Jan in cath lab. MD stated if pt left AMA he would no longer be his provider. Pt was informed of this and left anyway. Benita Gutter, RN will be adding her note as she was the primary RN.

## 2021-12-03 NOTE — Progress Notes (Signed)
Pt arrived to [facility] at 1620 this afternoon. Pt states he had procedure w/ Dr. Scot Dock this morning. I assured him it was not Dr.Dickson. He stated he left AMA after procedure as he knew how to care for himself and had an important appt with his landlord.  HE denied any bleeding or swelling at site. Denied any complications. He was hear to dispute that he was told he could not come back to the clinic. Discussed AMA; Discussed evidence base practise and following MD instructions post op. I explained I did not have any document that confirmed what he was frustrated about. Encouraged him to take care of himself, f/u in ED for any complications, connect with his My Chart for specific info. Pt left in a better state. Appreciative of the time.---Ellie Lunch.

## 2021-12-03 NOTE — Progress Notes (Signed)
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  He was taken to holding for manual hold after sheath removal.  I was called during my next case while I was in the procedure stating the patient was up walking around and wanted to leave AMA.  I discussed he needed to have 4 hours of bedrest per our normal protocol after the sheath was removed.  Patient was reportedly belligerent with staff and left AMA.  [name], MD [facility] Office: [place]

## 2021-12-03 NOTE — H&P (Signed)
History and Physical Interval Note:  12/03/2021 8:58 AM  Scott Chavez  has presented today for surgery, with the diagnosis of pad.  The various methods of treatment have been discussed with the patient and family. After consideration of risks, benefits and other options for treatment, the patient has consented to  Procedure(s): ABDOMINAL AORTOGRAM W/LOWER EXTREMITY (N/A) as a surgical intervention.  The patient's history has been reviewed, patient examined, no change in status, stable for surgery.  I have reviewed the patient's chart and labs.  Questions were answered to the patient's satisfaction.     Scott Chavez  HISTORY AND PHYSICAL        CC:  follow up. Requesting Provider:  Benito Mccreedy, MD   HPI: This is a 66 y.o. male who is here today for follow up for PAD.   He was previously under the care of Scott Chavez and had bilateral common iliac stents in 2015.  He also has a left common femoral to below-knee popliteal bypass with non-reversed ipsilateral vein done on 04/24/2013.  And on the right he had an iliofemoral endarterectomy with a right common femoral to below-knee pop bypass with non-reversed ipsilateral saphenous vein in 2016.    Pt was last seen 08/25/2021 and at that time, He was having issues with BLE swelling. He had been evaluated by duplex and has known venous insufficiency bilaterally. He had been wearing knee high compression stockings and elevating every night with 4 pillows. He was wearing his stockings 4-5 days a week. His swelling was worse over past 6 months. He did report being more active and walking a lot more recently. He was also having pain on ambulation L> R leg. He gets tightness and pulling in his left calf on ambulation. This was improved with rest. He felt this was similar to his procedures in the past. He did not have any pain at rest. No tissue loss.    S/p kidney transplant. This is functioning well per patient    The pt returns today for  follow up.  He had follow up studies in May and was supposed to be seen but did not make that appt.  The office reached out to him as it was felt he needed angiography based on his studies but could not reach the pt.  He has been rescheduled several times.  Per note, Patient has a transplanted cadaveric kidney on the right iliac artery and some level of CKD.  He will need to hydrate as well as be given 500-1000cc of normal saline in preop holding prior to angiography.     Pt states he was recently hospitalized for detox for heroin.  He states he was in the hospital from 7/10-17.  He has been clean since.  He does not smoke cigarettes but does vape.  He denies any wounds on his feet.  He does have some soreness on the inside of his great toes bilaterally at night that he has to rub to feel better.  He states he does get some cramping in both calves but not consistently.  It is worse with incline.  He states he does get some swelling in his legs.   He tells me when he was hospitalized, his last creatinine was 1.0.  looking at his chart, creatinine on 11/15/2021 was 0.95.  He has had his kidney transplant for about 13 years.    The pt is on a statin for cholesterol management.    The pt is not  on an aspirin.    Other AC:  none The pt is on BB, CCB for hypertension.  The pt does not have diabetes. Tobacco hx:  former           Past Medical History:  Diagnosis Date   ADHD     Anemia      pt. not sure, but reports he is taking Fe for one month now.   Aorto-iliac disease ([place])     Bladder dysfunction     ESRD (end stage renal disease) ([place])      [facility]- [facility] , followed by Dr. Moshe Chavez    GERD (gastroesophageal reflux disease)     Hemodialysis patient [facility])      "on dialysis 1998-2010" 04/25/2013)   Hepatitis C      has been treated with Harvoni   History of renal failure     Hypercholesteremia     Hypertension     Internal hemorrhoids      Colonoscopy 2010 and  anoscopy 2015   Peripheral vascular disease ([place])     Pneumonia     Self-catheterizes urinary bladder      pt. choice, but he reports that he desires to do this to avoid retention            Past Surgical History:  Procedure Laterality Date   ABDOMINAL AORTAGRAM N/A 03/09/2013    Procedure: ABDOMINAL Maxcine Ham;  Surgeon: Scott Dutch, MD;  Location: [facility] CATH LAB;  Service: Cardiovascular;  Laterality: N/A;   AV FISTULA PLACEMENT Left 1998    "removed 2011" (04/25/2013)   AV FISTULA REPAIR Left      removal with partial vein removed   COLONOSCOPY       ENDARTERECTOMY FEMORAL Right 04/17/2015    Procedure: ENDARTERECTOMY RIGHT ILIOFEMORAL WITH PROFUNDOPLASTY;  Surgeon: Scott [place], MD;  Location: [place];  Service: Vascular;  Laterality: Right;   ENDARTERECTOMY FEMORAL Right 10/25/2016    Procedure: RIGHT ILIOFEMORAL ENDARTERECTOMY WITH BOVINE PERICARDIAL PATCH ANGIOPLASTY;  Surgeon: Scott [place], MD;  Location: [place];  Service: Vascular;  Laterality: Right;   ESOPHAGOGASTRODUODENOSCOPY (EGD) WITH PROPOFOL N/A 02/05/2019    Procedure: ESOPHAGOGASTRODUODENOSCOPY (EGD) WITH PROPOFOL;  Surgeon: Scott Essex, MD;  Location: [facility] ENDOSCOPY;  Service: Endoscopy;  Laterality: N/A;   FEMORAL-POPLITEAL BYPASS GRAFT Left 04/24/2013    Procedure: BYPASS GRAFT COMMON FEMORALARTERY-BELOW KNEE POPLITEAL ARTERY WITH GREATER SAPHENOUS VEIN;  Surgeon: Scott Winifred, MD;  Location: [place];  Service: Vascular;  Laterality: Left;   FEMORAL-POPLITEAL BYPASS GRAFT Right 04/17/2015    Procedure: BYPASS GRAFT RIGHT FEMORALTO BELOW KNEE POPLITEAL ARTERY USING RIGHT NON REVERSED GREATER [place];  Surgeon: Scott [place], MD;  Location: [place];  Service: Vascular;  Laterality: Right;   HERNIA REPAIR   08/2019   INGUINAL HERNIA REPAIR Left 08/29/2019    Procedure: LEFT INGUINAL HERNIA REPAIR WITH MESH;  Surgeon: Scott Luna, MD;  Location: [place];  Service: General;  Laterality: Left;    INTRAOPERATIVE ARTERIOGRAM Left 04/24/2013    Procedure: INTRA OPERATIVE ARTERIOGRAM;  Surgeon: Scott , MD;  Location: [place];  Service: Vascular;  Laterality: Left;   KIDNEY TRANSPLANT       LOWER EXTREMITY ANGIOGRAM Bilateral 03/09/2013    Procedure: LOWER EXTREMITY ANGIOGRAM;  Surgeon: Scott Dutch, MD;  Location: [facility] CATH LAB;  Service: Cardiovascular;  Laterality: Bilateral;   LOWER EXTREMITY ANGIOGRAM Right 10/25/2013    Procedure: LOWER EXTREMITY ANGIOGRAM;  Surgeon: Jannette Fogo  Scott Larsson, MD;  Location: [facility] CATH LAB;  Service: Cardiovascular;  Laterality: Right;   LOWER EXTREMITY ANGIOGRAM Right 12/13/2013    Procedure: LOWER EXTREMITY ANGIOGRAM;  Surgeon: Scott [place], MD;  Location: [facility] CATH LAB;  Service: Cardiovascular;  Laterality: Right;   NEPHRECTOMY RECIPIENT   2010   NEPHRECTOMY TRANSPLANTED ORGAN       PATCH ANGIOPLASTY Right 04/17/2015    Procedure: Rueben Bash PATCH ANGIOPLASTY, RIGHT ILEOFEMORAL;  Surgeon: Scott [place], MD;  Location: [place];  Service: Vascular;  Laterality: Right;   PERIPHERAL VASCULAR CATHETERIZATION N/A 04/10/2015    Procedure: Abdominal Aortogram;  Surgeon: Scott [place], MD;  Location: [place] CV LAB;  Service: Cardiovascular;  Laterality: N/A;   PERIPHERAL VASCULAR CATHETERIZATION N/A 05/10/2016    Procedure: Abdominal Aortogram w/Lower Extremity;  Surgeon: Scott [place], MD;  Location: [place] CV LAB;  Service: Cardiovascular;  Laterality: N/A;   s/p cadaveric renal transplant   December 2010    [place] W/ EMBOLECTOMY Left 09/24/2015    Procedure: EXCISION OF THROMBOSED RADIOCEPHALIC ARTERIOVENOUS FISTULA;  Surgeon: Scott , MD;  Location: [place];  Service: Vascular;  Laterality: Left;   UMBILICAL HERNIA REPAIR N/A 08/29/2019    Procedure: UMBILICAL HERNIA REPAIR WITH MESH;  Surgeon: Scott Luna, MD;  Location: [place];  Service: General;  Laterality: N/A;   VEIN HARVEST Right 04/17/2015    Procedure: RIGHT  GREATER [place] ;  Surgeon: Scott , MD;  Location: [place];  Service: Vascular;  Laterality: Right;           Allergies  Allergen Reactions   Tape Rash and Other (See Comments)      NO Silk tape, please!!            Current Outpatient Medications  Medication Sig Dispense Refill   albuterol (PROVENTIL HFA;VENTOLIN HFA) 108 (90 Base) MCG/ACT inhaler Inhale 1-2 puffs into the lungs every 6 (six) hours as needed for wheezing or shortness of breath. 1 Inhaler 0   citalopram (CELEXA) 40 MG tablet Take 20 mg by mouth 2 (two) times daily.    2   Cyanocobalamin (VITAMIN B-12 PO) Take 1 tablet by mouth daily.       ferrous sulfate 325 (65 FE) MG EC tablet Take 1 tablet (325 mg total) by mouth 2 (two) times daily. (Patient taking differently: Take 325 mg by mouth 2 (two) times daily. Taking it PRN) 90 tablet 3   ketotifen (ZADITOR) 0.025 % ophthalmic solution Place 1 drop into both eyes 2 (two) times daily as needed (for seasonal allergies). (Patient not taking: Reported on 08/25/2021)       lisdexamfetamine (VYVANSE) 20 MG capsule Take 20 mg by mouth 2 (two) times daily.        metoprolol succinate (TOPROL-XL) 25 MG 24 hr tablet Take 25 mg by mouth 2 (two) times daily.    2   mycophenolate (CELLCEPT) 250 MG capsule Take 250-500 mg by mouth See admin instructions. Take 500 mg by mouth in the morning and 250 mg in the evening       NIFEdipine (ADALAT CC) 30 MG 24 hr tablet Take 30 mg by mouth at bedtime.       pantoprazole (PROTONIX) 40 MG tablet Take 40 mg by mouth daily.       Pyridoxine HCl (VITAMIN B-6 PO) Take 1 tablet by mouth 3 (three) times a week.        rosuvastatin (  CRESTOR) 5 MG tablet TAKE 1 TABLET BY MOUTH EVERYDAY AT BEDTIME 90 tablet 2   tacrolimus (PROGRAF) 1 MG capsule Take 3-4 mg by mouth 2 (two) times daily. 4 mg in the morning and 3 mg in the evening       tadalafil (CIALIS) 20 MG tablet Take 20 mg by mouth daily as needed for erectile dysfunction.        tamsulosin (FLOMAX) 0.4 MG CAPS capsule Take 0.4 mg by mouth daily.       zolpidem (AMBIEN) 10 MG tablet Take 5 mg by mouth at bedtime.        No current facility-administered medications for this visit.           Family History  Problem Relation Age of Onset   Hypertension Father     Other Father          amputation   Hypertension Mother        Social History         Socioeconomic History   Marital status: Single      Spouse name: Not on file   Number of children: 3   Years of education: Not on file   Highest education level: Not on file  Occupational History   Occupation: student   Occupation: [employer] driver  Tobacco Use   Smoking status: Former      Packs/day: 0.50      Years: 34.00      Total pack years: 17.00      Types: E-cigarettes, Cigarettes      Quit date: 02/17/2003      Years since quitting: 18.7   Smokeless tobacco: Former   Tobacco comments:      vapor cigrarette  Vaping Use   Vaping Use: Every day  Substance and Sexual Activity   Alcohol use: Not Currently      Alcohol/week: 0.0 standard drinks of alcohol   Drug use: Not Currently      Types: Cocaine, Marijuana, Heroin, Other-see comments      Comment: pt states used [place] a few weeks ago/ 04/25/2013 "abused in the past ; stopped  11/13/2002.....states smokes marijuana again 2021   Sexual activity: Not on file  Other Topics Concern   Not on file  Social History Narrative    Patient reports he is divorced. 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    On disability otherwise, he is a vapor smoker, 3 caffeinated beverages daily    Social Determinants of Health    Financial Resource Strain: Not on file  Food Insecurity: Not on file  Transportation Needs: Not on file  Physical Activity: Not on file  Stress: Not on file  Social Connections: Not on file  Intimate Partner Violence: Not on file        REVIEW OF SYSTEMS:    '[X]'$  denotes positive finding, '[ ]'$  denotes negative finding Cardiac    Comments:  Chest pain or chest pressure:      Shortness of breath upon exertion: x    Short of breath when lying flat:      Irregular heart rhythm:             Vascular      Pain in calf, thigh, or hip brought on by ambulation: x    Pain in feet at night that wakes you up from your sleep:       Blood clot in your veins:  Leg swelling:  x           Pulmonary      Oxygen at home:      Productive cough:       Wheezing:              Neurologic      Sudden weakness in arms or legs:       Sudden numbness in arms or legs:       Sudden onset of difficulty speaking or slurred speech:      Temporary loss of vision in one eye:       Problems with dizziness:              Gastrointestinal      Blood in stool:       Vomited blood:              Genitourinary      Burning when urinating:       Blood in urine:             Psychiatric      Major depression:              Hematologic      Bleeding problems:      Problems with blood clotting too easily:             Skin      Rashes or ulcers:             Constitutional      Fever or chills:          PHYSICAL EXAMINATION:      Today's Vitals    11/24/21 1044  BP: (!) 146/82  Pulse: 62  Resp: 20  Temp: 98 F (36.7 C)  TempSrc: Temporal  SpO2: 100%  Weight: 133 lb 1.6 oz (60.4 kg)  Height: '5\' 10"'$  (1.778 m)    Body mass index is 19.1 kg/m.     General:  WDWN in NAD; vital signs documented above Gait: Not observed HENT: WNL, normocephalic Pulmonary: normal non-labored breathing , without wheezing Cardiac: regular HR, without carotid bruits Abdomen: soft, NT, no masses; aortic pulse is not palpable Skin: without rashes Vascular Exam/Pulses:   Right Left  Radial 2+ (normal) 2+ (normal)  Femoral 2+ (normal) 2+ (normal)  Popliteal Unable to palpate Unable to palpate  DP absent absent  PT doppler monophasic brisk doppler monophasic  Peroneal Faint doppler monophasic Brisk doppler monophasic     Extremities:  without ischemic changes, without Gangrene , without cellulitis; without open wounds Musculoskeletal: no muscle wasting or atrophy       Neurologic: A&O X 3 Psychiatric:  The pt has Normal affect.     Non-Invasive Vascular Imaging:   ABI's/TBI's on 09/09/2021: Right:  0.62/0.31 - Great toe pressure: 39 Left:  0.85/0.44 - Great toe pressure: 55   Arterial duplex on 09/09/2021: Right Graft #1: Femoral-popliteal  +------------------+--------+--------+----------+--------+                    PSV cm/sStenosisWaveform  Comments  +------------------+--------+--------+----------+--------+  Inflow            121             biphasic            +------------------+--------+--------+----------+--------+  Prox Anastomosis  116             biphasic            +------------------+--------+--------+----------+--------+  Proximal Graft  99              biphasic            +------------------+--------+--------+----------+--------+  Mid Graft         36              biphasic            +------------------+--------+--------+----------+--------+  Distal Graft      40              biphasic            +------------------+--------+--------+----------+--------+  Distal Anastomosis41              biphasic            +------------------+--------+--------+----------+--------+  Outflow           43              monophasic          +------------------+--------+--------+----------+--------+   Left Graft #1: Femoral-popliteal  +--------------------+--------+--------+----------+--------+                      PSV cm/sStenosisWaveform  Comments  +--------------------+--------+--------+----------+--------+  Inflow              106             biphasic            +--------------------+--------+--------+----------+--------+  Proximal Anastomosis50              biphasic            +--------------------+--------+--------+----------+--------+  Proximal  Graft      52              biphasic            +--------------------+--------+--------+----------+--------+  Mid Graft           64              biphasic            +--------------------+--------+--------+----------+--------+  Distal Graft        50              biphasic            +--------------------+--------+--------+----------+--------+  Distal Anastomosis  58              biphasic            +--------------------+--------+--------+----------+--------+  Outflow             126             monophasic          +--------------------+--------+--------+----------+--------+    Abdominal Aorta Findings:  +-------------+------+----------+---------+--------+--------+--------------  ----+  Location     AP    Trans (cm)PSV      WaveformThrombusComments                      (cm)            (cm/s)                                   +-------------+------+----------+---------+--------+--------+--------------  Proximal                     104                                      +-------------+------+----------+---------+--------+--------+--------------  Mid                          95                                           +-------------+------+----------+---------+--------+--------+--------------  Distal                       81                                       +-------------+------+----------+---------+--------+--------+--------------  RT CIA Prox                  336                      stent          +-------------+------+----------+---------+--------+--------+--------------  RT CIA Mid                   268                      stent          +-------------+------+----------+---------+--------+--------+--------------  RT CIA Distal                349                      stent          +-------------+------+----------+---------+--------+--------+--------------  RT EIA Prox                  283                                       +-------------+------+----------+---------+--------+--------+--------------  RT EIA Mid                   244                                      +-------------+------+----------+---------+--------+--------+--------------  RT EIA Distal                147                                    +-------------+------+----------+---------+--------+--------+--------------  LT CIA Prox                                           unable to visualize      +-------------+------+----------+---------+--------+--------+--------------   LT CIA Mid                   141                      stent          +-------------+------+----------+---------+--------+--------+--------------  LT CIA Distal  175                      stent          +-------------+------+----------+---------+--------+--------+--------------  LT EIA Prox                  105                                      +-------------+------+----------+---------+--------+--------+--------------  LT EIA Mid                   118                                      +-------------+------+----------+---------+--------+--------+--------------  LT EIA Distal                474                                      +-------------+------+----------+---------+--------+--------+--------------    Previous ABI's/TBI's on 09/09/2020: Right:  0.84/0.50 - Great toe pressure: 90 Left:  0.89/0.70 - Great toe pressure:  126   Previous arterial duplex on 09/09/2020: Right Graft #1:  +------------------+--------+--------+----------+--------+                    PSV cm/sStenosisWaveform  Comments  +------------------+--------+--------+----------+--------+  Inflow            110             biphasic            +------------------+--------+--------+----------+--------+  Prox Anastomosis  174             biphasic             +------------------+--------+--------+----------+--------+  Proximal Graft    55              biphasic            +------------------+--------+--------+----------+--------+  Mid Graft         55              biphasic            +------------------+--------+--------+----------+--------+  Distal Graft      35              biphasic            +------------------+--------+--------+----------+--------+  Distal Anastomosis62              biphasic            +------------------+--------+--------+----------+--------+  Outflow           40              monophasic          +------------------+--------+--------+----------+--------+   Mid anterior shin area of concern: cystic structure, does not appear to be  a vein/varicosity. Measures 1.25 cm.    Left Graft #1: +--------------------+--------+--------+--------+--------+                      PSV cm/sStenosisWaveformComments  +--------------------+--------+--------+--------+--------+  Inflow              81              biphasic          +--------------------+--------+--------+--------+--------+  Proximal Anastomosis40              biphasic          +--------------------+--------+--------+--------+--------+  Proximal Graft      47              biphasic          +--------------------+--------+--------+--------+--------+  Mid Graft           40              biphasic          +--------------------+--------+--------+--------+--------+  Distal Graft        50              biphasic          +--------------------+--------+--------+--------+--------+  Distal Anastomosis  46              biphasic          +--------------------+--------+--------+--------+--------+  Outflow             106             biphasic          +--------------------+--------+--------+--------+--------+    Abdominal Aorta Findings:   +-------------+-------+----------+----------+--------+--------+--------+  Location     AP (cm)Trans (cm)PSV (cm/s)WaveformThrombusComments  +-------------+-------+----------+----------+--------+--------+--------+  Proximal     1.60             89                                  +-------------+-------+----------+----------+--------+--------+--------+  Mid          1.81             122                                 +-------------+-------+----------+----------+--------+--------+--------+  Distal       1.82             114                                 +-------------+-------+----------+----------+--------+--------+--------+  RT CIA Prox                   114                       stent     +-------------+-------+----------+----------+--------+--------+--------+  RT CIA Mid                    129                       stent     +-------------+-------+----------+----------+--------+--------+--------+  RT CIA Distal                 230                       stent     +-------------+-------+----------+----------+--------+--------+--------+  RT EIA Prox                   275                                 +-------------+-------+----------+----------+--------+--------+--------+  RT EIA Mid  258                                 +-------------+-------+----------+----------+--------+--------+--------+  RT EIA Distal                 140                                 +-------------+-------+----------+----------+--------+--------+--------+  LT CIA Prox                   116                       stent     +-------------+-------+----------+----------+--------+--------+--------+  LT CIA Mid                    147                       stent     +-------------+-------+----------+----------+--------+--------+--------+  LT CIA Distal                 188                       stent      +-------------+-------+----------+----------+--------+--------+--------+  LT EIA Prox                   125                                 +-------------+-------+----------+----------+--------+--------+--------+  LT EIA Mid                    115                                 +-------------+-------+----------+----------+--------+--------+--------+  LT EIA Distal                 467                                 +-------------+-------+----------+----------+--------+--------+--------+   Transplant kidney anastomosis to right iliac artery- 239 cm/s. Patent.      ASSESSMENT/PLAN:: 66 y.o. male here for follow up for PAD with hx of bilateral common iliac stents in 2015.  He also has a left common femoral to below-knee popliteal bypass with non-reversed ipsilateral vein done on 04/24/2013.  And on the right he had an iliofemoral endarterectomy with a right common femoral to below-knee pop bypass with non-reversed ipsilateral saphenous vein in 2016.    -pt duplex from May reveals significantly increased velocities in the right CIA and left EIA.  He is having bilateral claudication symptoms but not consistently one leg or the other.  Discussed with Dr. Carlis Abbott and given these increased velocities and sluggish flow in the RLE bypass graft, will schedule for arteriogram with possible intervention as he is at risk for threatened bypass of the lower extremities.  Discussed with pt that this will be done in the [facility] lab and that general anesthesia will not be used.  He expressed understanding. -he has hx of renal transplant and last creatinine on 11/15/2021 was 0.95.   -continue  statin       Leontine Locket, [facility] [phone]   Clinic MD:   Carlis Abbott

## 2021-12-03 NOTE — Op Note (Signed)
Patient name: Scott Chavez MRN: [id number] DOB: 03-03-1956 Sex: male  12/03/2021 Pre-operative Diagnosis: Concern for high-grade right common iliac artery in-stent stenosis and high-grade left external iliac artery stenosis both serve as inflow to bilateral lower extremity femoropopliteal bypasses. Post-operative diagnosis:  Same Surgeon:  [name], MD Procedure Performed: 1.  Ultrasound-guided access right common femoral artery 2.  CO2 aortogram 3.  Left external iliac artery angioplasty with stent placement (stented with 8 mm x 40 mm Innova and post-dilated with 7 mm Mustang) 4.  Pullback pressure in the bilateral common iliac arteries 5.  50 minutes of monitored moderate conscious sedation time  Indications: Patient is a 66 year old male who has previously undergone bilateral common iliac artery stenting in 2015 by Dr. Bridgett Larsson.  He also had bilateral common femoral endarterectomies with bilateral lower extremity femoropopliteal bypass grafts.  He was seen in surveillance with concern for high-grade stenosis in the right common iliac stent as well as the left external iliac artery.  Both serve as inflow for his lower extremity bypass grafts.  He presents today for aortogram with lower extremity arteriogram with possible intervention.  We tried to do most of the case with CO2 given his kidney transplant that is still functioning.  Risk benefits discussed  Contrast: 20 mL, remainder of case with CO2  Findings:   CO2 aortogram was initially performed.  We were able to visualize the kidney allograft off the right iliac artery.  Significant bowel gas that limited any visualization of significant stenosis.  We did abdominal aortogram with 10 mL of contrast.  This showed his infrarenal aorta was patent.  Both common iliac artery stents are patent with some in-stent stenosis that looked to be about 50% each.  There was a high-grade left external iliac stenosis greater than 80% that was  calcified.  Both common femoral endarterectomies were widely patent with visualization of bilateral femoropopliteal bypass grafts that were patent as well.    We initially treated the left external iliac artery stenosis as it appeared most high-grade.  This was stented with an 8 mm x 40 mm self-expanding Innova and then postdilated with a 7 mm angioplasty balloon.  This was a calcified lesion with less than 30% residual stenosis.  We did pullback pressures in both common iliac artery stents and on the left there was no significant difference and on the right there was an approximate 10 mmHg difference.  We elected not to intervene on the common iliac artery stents at this time.   Procedure:  The patient was identified in the holding area and taken to room 8.  The patient was then placed supine on the table and prepped and draped in the usual sterile fashion.  A time out was called.  The patient did receive Versed and fentanyl for moderate conscious sedation.  Vital signs were monitored including heart rate, oxygenation, blood pressure and respiratory rate.  I was present for all of sedation.  Ultrasound was used to evaluate the right common femoral artery.  It was patent .  A digital ultrasound image was acquired.  A micropuncture needle was used to access the right common femoral artery under ultrasound guidance.  An 018 wire was advanced without resistance and a micropuncture sheath was placed.  The 018 wire was removed and a benson wire was placed.  The micropuncture sheath was exchanged for a 5 french sheath.  An omniflush catheter was advanced over the wire to the level of L-1.  An  abdominal angiogram was obtained.  We did the initial aortogram with CO2.  Given significant bowel gas we really could not visualize the vessels.  We then used a limited 10 cc of contrast for a contrast aortogram.  The most obvious stenosis was the left external iliac artery that is the inflow for his left leg bypass.  We elected  to treat this.  A Omni Flush catheter was used with a Glidewire advantage to cross the aortic bifurcation and then we crossed the iliac stenosis on the left into the bypass with our wire.  We placed a long 7 [place] Ansell sheath in the right groin over the aortic bifurcation and he was given 100 units/kg IV heparin.  We stented the left external iliac artery with a 8 mm x 40 mm angioplasty balloon and then postdilated this with a 7 mm balloon.  No significant residual stenosis.  We then did pullback pressures of the iliac stents.  There did appear to be some in-stent stenosis of the left and right common iliac stents of about 50%.  There was no significant pressure difference in the left common iliac stent and only about a 10 mm Hg difference in the right common iliac stent.  We elected not to treat these at this time.  Wires and catheters were removed.  The sheath was pulled into the right external iliac artery.  Taken to holding in stable condition.  Plan: Good results after stenting left external iliac high-grade stenosis which is inflow to his left leg bypass.  Elected not to intervene on his common iliac artery stents today given no significant difference on pullback pressure to warrant intervention Will load on Plavix today.  Discussed aspirin as well.  We will follow-up in 1 month with noninvasive imaging.  [name], MD [facility] [place] Office: [place]

## 2021-12-03 NOTE — Progress Notes (Signed)
Site area: rt groin arterial Site Prior to Removal:  Level 0 Pressure Applied For: 20 minutes Manual:   yes Patient Status During Pull:  stable Post Pull Site:  Level 0 Post Pull Instructions Given:  yes Post Pull Pulses Present: rt pt dopplered Dressing Applied:  gauze and tegaderm Bedrest begins @ 1310 Comments:

## 2021-12-04 ENCOUNTER — Encounter (HOSPITAL_COMMUNITY): Payer: Self-pay | Admitting: Vascular Surgery

## 2021-12-17 DIAGNOSIS — Z940 Kidney transplant status: Secondary | ICD-10-CM | POA: Diagnosis not present

## 2021-12-17 DIAGNOSIS — I739 Peripheral vascular disease, unspecified: Secondary | ICD-10-CM | POA: Diagnosis not present

## 2021-12-17 DIAGNOSIS — E7800 Pure hypercholesterolemia, unspecified: Secondary | ICD-10-CM | POA: Diagnosis not present

## 2021-12-17 DIAGNOSIS — Z8619 Personal history of other infectious and parasitic diseases: Secondary | ICD-10-CM | POA: Diagnosis not present

## 2021-12-17 DIAGNOSIS — I129 Hypertensive chronic kidney disease with stage 1 through stage 4 chronic kidney disease, or unspecified chronic kidney disease: Secondary | ICD-10-CM | POA: Diagnosis not present

## 2021-12-17 DIAGNOSIS — D649 Anemia, unspecified: Secondary | ICD-10-CM | POA: Diagnosis not present

## 2021-12-17 DIAGNOSIS — N319 Neuromuscular dysfunction of bladder, unspecified: Secondary | ICD-10-CM | POA: Diagnosis not present

## 2021-12-28 DIAGNOSIS — R339 Retention of urine, unspecified: Secondary | ICD-10-CM | POA: Diagnosis not present

## 2022-01-06 DIAGNOSIS — I10 Essential (primary) hypertension: Secondary | ICD-10-CM | POA: Diagnosis not present

## 2022-01-08 ENCOUNTER — Other Ambulatory Visit: Payer: Self-pay | Admitting: *Deleted

## 2022-01-08 DIAGNOSIS — I739 Peripheral vascular disease, unspecified: Secondary | ICD-10-CM

## 2022-01-08 DIAGNOSIS — I70213 Atherosclerosis of native arteries of extremities with intermittent claudication, bilateral legs: Secondary | ICD-10-CM

## 2022-01-08 DIAGNOSIS — I779 Disorder of arteries and arterioles, unspecified: Secondary | ICD-10-CM

## 2022-01-16 ENCOUNTER — Other Ambulatory Visit: Payer: Self-pay | Admitting: Cardiology

## 2022-01-16 DIAGNOSIS — E7800 Pure hypercholesterolemia, unspecified: Secondary | ICD-10-CM

## 2022-01-19 ENCOUNTER — Ambulatory Visit (INDEPENDENT_AMBULATORY_CARE_PROVIDER_SITE_OTHER)
Admission: RE | Admit: 2022-01-19 | Discharge: 2022-01-19 | Disposition: A | Discharge status: Home / Self Care | Payer: Medicare HMO | Source: Ambulatory Visit | Attending: Vascular Surgery | Admitting: Vascular Surgery

## 2022-01-19 ENCOUNTER — Ambulatory Visit (INDEPENDENT_AMBULATORY_CARE_PROVIDER_SITE_OTHER): Payer: Medicare HMO | Admitting: Vascular Surgery

## 2022-01-19 ENCOUNTER — Encounter: Payer: Self-pay | Admitting: Vascular Surgery

## 2022-01-19 ENCOUNTER — Ambulatory Visit (HOSPITAL_COMMUNITY)
Admission: RE | Admit: 2022-01-19 | Discharge: 2022-01-19 | Disposition: A | Discharge status: Home / Self Care | Payer: Medicare HMO | Source: Ambulatory Visit | Attending: Vascular Surgery | Admitting: Vascular Surgery

## 2022-01-19 VITALS — BP 154/75 | HR 61 | Temp 97.9°F | Resp 18 | Ht 70.0 in | Wt 140.0 lb

## 2022-01-19 DIAGNOSIS — I70213 Atherosclerosis of native arteries of extremities with intermittent claudication, bilateral legs: Secondary | ICD-10-CM

## 2022-01-19 DIAGNOSIS — I739 Peripheral vascular disease, unspecified: Secondary | ICD-10-CM

## 2022-01-19 DIAGNOSIS — I779 Disorder of arteries and arterioles, unspecified: Secondary | ICD-10-CM | POA: Diagnosis not present

## 2022-01-19 NOTE — Progress Notes (Signed)
Patient name: Scott Chavez MRN: [id number] DOB: 1956-03-03 Sex: male  REASON FOR VISIT: Follow-up after recent iliac artery intervention  HPI: Scott Chavez is a 66 y.o. male with history of end-stage renal disease now status post kidney transplant as well as peripheral vascular disease that presents for interval follow-up after recent left external iliac artery intervention.  He recently underwent a left external iliac angioplasty with stent placement for high-grade stenosis on 12/03/2021.  We elected not to intervene on his common iliac artery stent stenosis after pullback pressures in addition to the fact that he has a kidney allograft off the right iliac.  States he is not having any problems with his legs.  He is taking his aspirin and Plavix.  He is vaping.  He was previously under the care of Dr. Bridgett Larsson and had bilateral common iliac stents in 2015.  He also has a left common femoral to below-knee popliteal bypass with non-reversed ipsilateral vein done on 04/24/2013.  And on the right he had an iliofemoral endarterectomy with a right common femoral to below-knee pop bypass with non-reversed ipsilateral saphenous vein in 2016.    Past Medical History:  Diagnosis Date   ADHD    Anemia    pt. not sure, but reports he is taking Fe for one month now.   Aorto-iliac disease ([place])    Bladder dysfunction    ESRD (end stage renal disease) ([place])    [facility]- [facility] , followed by Dr. Moshe Cipro    GERD (gastroesophageal reflux disease)    Hemodialysis patient [facility])    "on dialysis 1998-2010" 04/25/2013)   Hepatitis C    has been treated with Harvoni   History of renal failure    Hypercholesteremia    Hypertension    Internal hemorrhoids    Colonoscopy 2010 and anoscopy 2015   Peripheral vascular disease ([place])    Pneumonia    Self-catheterizes urinary bladder    pt. choice, but he reports that he desires to do this to avoid retention     Past Surgical History:   Procedure Laterality Date   ABDOMINAL AORTAGRAM N/A 03/09/2013   Procedure: ABDOMINAL Maxcine Ham;  Surgeon: Elam Dutch, MD;  Location: [facility] CATH LAB;  Service: Cardiovascular;  Laterality: N/A;   ABDOMINAL AORTOGRAM W/LOWER EXTREMITY N/A 12/03/2021   Procedure: ABDOMINAL AORTOGRAM W/LOWER EXTREMITY;  Surgeon: [name], MD;  Location: [place] CV LAB;  Service: Cardiovascular;  Laterality: N/A;   AV FISTULA PLACEMENT Left 1998   "removed 2011" (04/25/2013)   AV FISTULA REPAIR Left    removal with partial vein removed   COLONOSCOPY     ENDARTERECTOMY FEMORAL Right 04/17/2015   Procedure: ENDARTERECTOMY RIGHT ILIOFEMORAL WITH PROFUNDOPLASTY;  Surgeon: Conrad Akron, MD;  Location: [place];  Service: Vascular;  Laterality: Right;   ENDARTERECTOMY FEMORAL Right 10/25/2016   Procedure: RIGHT ILIOFEMORAL ENDARTERECTOMY WITH BOVINE PERICARDIAL PATCH ANGIOPLASTY;  Surgeon: Conrad Lenzburg, MD;  Location: [place];  Service: Vascular;  Laterality: Right;   ESOPHAGOGASTRODUODENOSCOPY (EGD) WITH PROPOFOL N/A 02/05/2019   Procedure: ESOPHAGOGASTRODUODENOSCOPY (EGD) WITH PROPOFOL;  Surgeon: Clarene Essex, MD;  Location: [facility] ENDOSCOPY;  Service: Endoscopy;  Laterality: N/A;   FEMORAL-POPLITEAL BYPASS GRAFT Left 04/24/2013   Procedure: BYPASS GRAFT COMMON FEMORALARTERY-BELOW KNEE POPLITEAL ARTERY WITH GREATER SAPHENOUS VEIN;  Surgeon: Conrad Leonia, MD;  Location: [place];  Service: Vascular;  Laterality: Left;   FEMORAL-POPLITEAL BYPASS GRAFT Right 04/17/2015   Procedure: BYPASS GRAFT RIGHT FEMORALTO BELOW KNEE  POPLITEAL ARTERY USING RIGHT NON REVERSED GREATER SAPPHENOUS VEIN;  Surgeon: Conrad Sand[place]ky, MD;  Location: [place];  Service: Vascular;  Laterality: Right;   HERNIA REPAIR  08/2019   INGUINAL HERNIA REPAIR Left 08/29/2019   Procedure: LEFT INGUINAL HERNIA REPAIR WITH MESH;  Surgeon: Erroll Luna, MD;  Location: [place];  Service: General;  Laterality: Left;   INTRAOPERATIVE  ARTERIOGRAM Left 04/24/2013   Procedure: INTRA OPERATIVE ARTERIOGRAM;  Surgeon: Conrad Ste[place], MD;  Location: [place];  Service: Vascular;  Laterality: Left;   KIDNEY TRANSPLANT     LOWER EXTREMITY ANGIOGRAM Bilateral 03/09/2013   Procedure: LOWER EXTREMITY ANGIOGRAM;  Surgeon: Elam Dutch, MD;  Location: [facility] CATH LAB;  Service: Cardiovascular;  Laterality: Bilateral;   LOWER EXTREMITY ANGIOGRAM Right 10/25/2013   Procedure: LOWER EXTREMITY ANGIOGRAM;  Surgeon: Conrad [place], MD;  Location: [facility] CATH LAB;  Service: Cardiovascular;  Laterality: Right;   LOWER EXTREMITY ANGIOGRAM Right 12/13/2013   Procedure: LOWER EXTREMITY ANGIOGRAM;  Surgeon: Conrad [place], MD;  Location: [facility] CATH LAB;  Service: Cardiovascular;  Laterality: Right;   NEPHRECTOMY RECIPIENT  2010   NEPHRECTOMY TRANSPLANTED ORGAN     PATCH ANGIOPLASTY Right 04/17/2015   Procedure: Rueben Bash PATCH ANGIOPLASTY, RIGHT ILEOFEMORAL;  Surgeon: Conrad Dover, MD;  Location: [place];  Service: Vascular;  Laterality: Right;   PERIPHERAL VASCULAR CATHETERIZATION N/A 04/10/2015   Procedure: Abdominal Aortogram;  Surgeon: Conrad Steele, MD;  Location: [place] CV LAB;  Service: Cardiovascular;  Laterality: N/A;   PERIPHERAL VASCULAR CATHETERIZATION N/A 05/10/2016   Procedure: Abdominal Aortogram w/Lower Extremity;  Surgeon: Conrad Piperton, MD;  Location: [place] CV LAB;  Service: Cardiovascular;  Laterality: N/A;   PERIPHERAL VASCULAR INTERVENTION  12/03/2021   Procedure: PERIPHERAL VASCULAR INTERVENTION;  Surgeon: [name], MD;  Location: [place] CV LAB;  Service: Cardiovascular;;   s/p cadaveric renal transplant  December 2010   [place] W/ EMBOLECTOMY Left 09/24/2015   Procedure: EXCISION OF THROMBOSED RADIOCEPHALIC ARTERIOVENOUS FISTULA;  Surgeon: Conrad Weldona, MD;  Location: [place];  Service: Vascular;  Laterality: Left;   UMBILICAL HERNIA REPAIR N/A 08/29/2019   Procedure: UMBILICAL HERNIA REPAIR WITH MESH;   Surgeon: Erroll Luna, MD;  Location: [place];  Service: General;  Laterality: N/A;   VEIN HARVEST Right 04/17/2015   Procedure: RIGHT GREATER [place] ;  Surgeon: Conrad [place], MD;  Location: [place];  Service: Vascular;  Laterality: Right;    Family History  Problem Relation Age of Onset   Hypertension Father    Other Father        amputation   Hypertension Mother     SOCIAL HISTORY: Social History   Tobacco Use   Smoking status: Former    Packs/day: 0.50    Years: 34.00    Total pack years: 17.00    Types: E-cigarettes, Cigarettes    Quit date: 02/17/2003    Years since quitting: 18.9    Passive exposure: Never   Smokeless tobacco: Former   Tobacco comments:    vapor cigrarette  Substance Use Topics   Alcohol use: Not Currently    Alcohol/week: 0.0 standard drinks of alcohol    Allergies  Allergen Reactions   Tape Rash and Other (See Comments)    NO Silk tape, please!!    Current Outpatient Medications  Medication Sig Dispense Refill   albuterol (PROVENTIL HFA;VENTOLIN HFA) 108 (90 Base) MCG/ACT inhaler Inhale  1-2 puffs into the lungs every 6 (six) hours as needed for wheezing or shortness of breath. 1 Inhaler 0   aspirin EC 81 MG tablet Take 1 tablet (81 mg total) by mouth daily. Swallow whole. 150 tablet 2   clopidogrel (PLAVIX) 75 MG tablet Take 1 tablet (75 mg total) by mouth daily. 30 tablet 11   Cyanocobalamin (VITAMIN B-12 PO) Take 1 tablet by mouth daily.     ferrous sulfate 325 (65 FE) MG EC tablet Take 1 tablet (325 mg total) by mouth 2 (two) times daily. 90 tablet 3   labetalol (NORMODYNE) 200 MG tablet Take 200 mg by mouth 2 (two) times daily.     Multiple Vitamin (MULTIVITAMIN WITH MINERALS) TABS tablet Take 1 tablet by mouth daily.     mycophenolate (CELLCEPT) 250 MG capsule Take 250 mg by mouth 2 (two) times daily.     NIFEdipine (ADALAT CC) 30 MG 24 hr tablet Take 30 mg by mouth at bedtime.     rosuvastatin  (CRESTOR) 5 MG tablet TAKE 1 TABLET BY MOUTH EVERYDAY AT BEDTIME 90 tablet 2   tacrolimus (PROGRAF) 1 MG capsule Take 3-4 mg by mouth See admin instructions. Take 4 mg by mouth in the morning and 3 mg in the evening     tadalafil (CIALIS) 20 MG tablet Take 20 mg by mouth daily as needed for erectile dysfunction.     tamsulosin (FLOMAX) 0.4 MG CAPS capsule Take 0.4 mg by mouth daily.     zolpidem (AMBIEN) 10 MG tablet Take 10 mg by mouth at bedtime as needed for sleep.     No current facility-administered medications for this visit.    REVIEW OF SYSTEMS:  '[X]'$  denotes positive finding, '[ ]'$  denotes negative finding Cardiac  Comments:  Chest pain or chest pressure:    Shortness of breath upon exertion:    Short of breath when lying flat:    Irregular heart rhythm:        Vascular    Pain in calf, thigh, or hip brought on by ambulation:    Pain in feet at night that wakes you up from your sleep:     Blood clot in your veins:    Leg swelling:         Pulmonary    Oxygen at home:    Productive cough:     Wheezing:         Neurologic    Sudden weakness in arms or legs:     Sudden numbness in arms or legs:     Sudden onset of difficulty speaking or slurred speech:    Temporary loss of vision in one eye:     Problems with dizziness:         Gastrointestinal    Blood in stool:     Vomited blood:         Genitourinary    Burning when urinating:     Blood in urine:        Psychiatric    Major depression:         Hematologic    Bleeding problems:    Problems with blood clotting too easily:        Skin    Rashes or ulcers:        Constitutional    Fever or chills:      PHYSICAL EXAM: Vitals:   01/19/22 0916  BP: (!) 154/75  Pulse: 61  Resp: 18  Temp: 97.9 F (36.6  C)  TempSrc: Temporal  SpO2: 98%  Weight: 140 lb (63.5 kg)  Height: '5\' 10"'$  (1.778 m)    GENERAL: The patient is a well-nourished male, in no acute distress. The vital signs are documented  above. CARDIAC: There is a regular rate and rhythm.  VASCULAR:  Palpable femoral pulses bilaterally Left DP palpable Right PT brisk by Doppler No lower extremity tissue loss  DATA:   ABIs today are 0.78 on the right biphasic and 0.99 on the left biphasic.  Aortoiliac duplex shows greater than 50% stenosis in the right liac artery with resolution of the left external iliac artery high grade stenosis after recent stenting.  Assessment/Plan:  66 year old male presents for follow-up after recent left external iliac artery angioplasty with stent placement on 12/03/2021 for a high-grade stenosis.  This is in the setting of previous bilateral common iliac artery stenting and bilateral femoropopliteal bypasses by Dr. Bridgett Larsson years ago.  Excellent results on the left as the high-grade stenosis is resolved and he has normal ABI on the left with a palpable pedal pulse.  I do not see any residual high-grade stenosis on the left iliac artery based on duplex.  We elected not to intervene on his right iliac artery after doing pullback pressures and only saw about a 50% stenosis and felt the risk outweighed the benefits especially with his kidney allograft coming off the right iliac artery.  I will bring him back in 6 months with noninvasive imaging in the [facility].  Continue aspirin and plavix.   [name], MD [facility] [place] Office: [phone]

## 2022-01-26 ENCOUNTER — Other Ambulatory Visit: Payer: Self-pay

## 2022-01-26 DIAGNOSIS — I70213 Atherosclerosis of native arteries of extremities with intermittent claudication, bilateral legs: Secondary | ICD-10-CM

## 2022-01-26 DIAGNOSIS — I779 Disorder of arteries and arterioles, unspecified: Secondary | ICD-10-CM

## 2022-01-26 DIAGNOSIS — I739 Peripheral vascular disease, unspecified: Secondary | ICD-10-CM

## 2022-02-04 DIAGNOSIS — R339 Retention of urine, unspecified: Secondary | ICD-10-CM | POA: Diagnosis not present

## 2022-02-05 DIAGNOSIS — I10 Essential (primary) hypertension: Secondary | ICD-10-CM | POA: Diagnosis not present

## 2022-03-08 DIAGNOSIS — I10 Essential (primary) hypertension: Secondary | ICD-10-CM | POA: Diagnosis not present

## 2022-04-02 DIAGNOSIS — Z940 Kidney transplant status: Secondary | ICD-10-CM | POA: Diagnosis not present

## 2022-04-02 DIAGNOSIS — N390 Urinary tract infection, site not specified: Secondary | ICD-10-CM | POA: Diagnosis not present

## 2022-04-12 DIAGNOSIS — F988 Other specified behavioral and emotional disorders with onset usually occurring in childhood and adolescence: Secondary | ICD-10-CM | POA: Diagnosis not present

## 2022-04-12 DIAGNOSIS — E7800 Pure hypercholesterolemia, unspecified: Secondary | ICD-10-CM | POA: Diagnosis not present

## 2022-04-12 DIAGNOSIS — Z8619 Personal history of other infectious and parasitic diseases: Secondary | ICD-10-CM | POA: Diagnosis not present

## 2022-04-12 DIAGNOSIS — I739 Peripheral vascular disease, unspecified: Secondary | ICD-10-CM | POA: Diagnosis not present

## 2022-04-12 DIAGNOSIS — Z940 Kidney transplant status: Secondary | ICD-10-CM | POA: Diagnosis not present

## 2022-04-12 DIAGNOSIS — I129 Hypertensive chronic kidney disease with stage 1 through stage 4 chronic kidney disease, or unspecified chronic kidney disease: Secondary | ICD-10-CM | POA: Diagnosis not present

## 2022-04-12 DIAGNOSIS — N319 Neuromuscular dysfunction of bladder, unspecified: Secondary | ICD-10-CM | POA: Diagnosis not present

## 2022-04-12 DIAGNOSIS — D649 Anemia, unspecified: Secondary | ICD-10-CM | POA: Diagnosis not present

## 2022-04-22 DIAGNOSIS — R339 Retention of urine, unspecified: Secondary | ICD-10-CM | POA: Diagnosis not present

## 2022-05-05 ENCOUNTER — Other Ambulatory Visit: Payer: Self-pay | Admitting: Cardiology

## 2022-05-05 DIAGNOSIS — E7800 Pure hypercholesterolemia, unspecified: Secondary | ICD-10-CM

## 2022-05-08 DIAGNOSIS — I10 Essential (primary) hypertension: Secondary | ICD-10-CM | POA: Diagnosis not present

## 2022-05-12 ENCOUNTER — Telehealth: Payer: Self-pay

## 2022-05-12 NOTE — Telephone Encounter (Signed)
Called patient to schedule appointment unable to reach and left him a message

## 2022-05-21 ENCOUNTER — Ambulatory Visit: Payer: Medicare HMO | Admitting: Internal Medicine

## 2022-05-28 ENCOUNTER — Telehealth: Payer: Self-pay

## 2022-05-28 ENCOUNTER — Encounter: Payer: Self-pay | Admitting: Internal Medicine

## 2022-05-28 ENCOUNTER — Ambulatory Visit: Payer: Medicare HMO | Admitting: Internal Medicine

## 2022-05-28 VITALS — BP 136/73 | HR 90 | Ht 70.0 in | Wt 142.2 lb

## 2022-05-28 DIAGNOSIS — I70213 Atherosclerosis of native arteries of extremities with intermittent claudication, bilateral legs: Secondary | ICD-10-CM | POA: Diagnosis not present

## 2022-05-28 DIAGNOSIS — R9431 Abnormal electrocardiogram [ECG] [EKG]: Secondary | ICD-10-CM

## 2022-05-28 DIAGNOSIS — I6523 Occlusion and stenosis of bilateral carotid arteries: Secondary | ICD-10-CM

## 2022-05-28 DIAGNOSIS — I739 Peripheral vascular disease, unspecified: Secondary | ICD-10-CM

## 2022-05-28 DIAGNOSIS — I10 Essential (primary) hypertension: Secondary | ICD-10-CM | POA: Diagnosis not present

## 2022-05-28 MED ORDER — NIFEDIPINE ER 90 MG PO TB24: 90.0000 mg | ORAL_TABLET | Freq: Every day | ORAL | 3 refills | Status: AC

## 2022-05-28 NOTE — Progress Notes (Unsigned)
Primary Physician/Referring:  Scott Mccreedy, MD  Patient ID: Scott Chavez, male    DOB: 05-05-1955, 67 y.o.   MRN: [id number]  Chief Complaint  Patient presents with   peripheral artery disease   Follow-up   HPI:    Scott Chavez  is a 67 y.o. male with PAD, HTN, and HLD who is here for a follow-up visit. He states he as not been here in some time. He says he is complaint with his blood pressure medications but he does not know exactly what he is taking off the top of his head. He is enrolled in our RPM monitoring program but he is not consistent with taking his pressures. Of note, his SBP average is very high and patient is ok with me increasing one of his medications. Patient denies chest pain, shortness of breath, palpitations, diaphoresis, syncope, edema, PND, orthopnea.   Past Medical History:  Diagnosis Date   ADHD    Anemia    pt. not sure, but reports he is taking Fe for one month now.   Aorto-iliac disease ([place])    Bladder dysfunction    ESRD (end stage renal disease) ([place])    transplant- [facility] , followed by Dr. Moshe Cipro    GERD (gastroesophageal reflux disease)    Hemodialysis patient [facility])    "on dialysis 1998-2010" 04/25/2013)   Hepatitis C    has been treated with Harvoni   History of renal failure    Hypercholesteremia    Hypertension    Internal hemorrhoids    Colonoscopy 2010 and anoscopy 2015   Peripheral vascular disease ([place])    Pneumonia    Self-catheterizes urinary bladder    pt. choice, but he reports that he desires to do this to avoid retention    Past Surgical History:  Procedure Laterality Date   ABDOMINAL AORTAGRAM N/A 03/09/2013   Procedure: ABDOMINAL Maxcine Ham;  Surgeon: Elam Dutch, MD;  Location: [facility] CATH LAB;  Service: Cardiovascular;  Laterality: N/A;   ABDOMINAL AORTOGRAM W/LOWER EXTREMITY N/A 12/03/2021   Procedure: ABDOMINAL AORTOGRAM W/LOWER EXTREMITY;  Surgeon: [name], MD;  Location: [place] CV LAB;  Service: Cardiovascular;  Laterality: N/A;   AV FISTULA PLACEMENT Left 1998   "removed 2011" (04/25/2013)   AV FISTULA REPAIR Left    removal with partial vein removed   COLONOSCOPY     ENDARTERECTOMY FEMORAL Right 04/17/2015   Procedure: ENDARTERECTOMY RIGHT ILIOFEMORAL WITH PROFUNDOPLASTY;  Surgeon: Conrad Thornton, MD;  Location: [place];  Service: Vascular;  Laterality: Right;   ENDARTERECTOMY FEMORAL Right 10/25/2016   Procedure: RIGHT ILIOFEMORAL ENDARTERECTOMY WITH BOVINE PERICARDIAL PATCH ANGIOPLASTY;  Surgeon: Conrad Howard, MD;  Location: [place];  Service: Vascular;  Laterality: Right;   ESOPHAGOGASTRODUODENOSCOPY (EGD) WITH PROPOFOL N/A 02/05/2019   Procedure: ESOPHAGOGASTRODUODENOSCOPY (EGD) WITH PROPOFOL;  Surgeon: Clarene Essex, MD;  Location: [facility] ENDOSCOPY;  Service: Endoscopy;  Laterality: N/A;   FEMORAL-POPLITEAL BYPASS GRAFT Left 04/24/2013   Procedure: BYPASS GRAFT COMMON FEMORALARTERY-BELOW KNEE POPLITEAL ARTERY WITH GREATER SAPHENOUS VEIN;  Surgeon: Conrad [place], MD;  Location: [place];  Service: Vascular;  Laterality: Left;   FEMORAL-POPLITEAL BYPASS GRAFT Right 04/17/2015   Procedure: BYPASS GRAFT RIGHT FEMORALTO BELOW KNEE POPLITEAL ARTERY USING RIGHT NON REVERSED GREATER [place];  Surgeon: Conrad , MD;  Location: [place];  Service: Vascular;  Laterality: Right;   HERNIA REPAIR  08/2019   INGUINAL HERNIA REPAIR Left 08/29/2019   Procedure: LEFT INGUINAL HERNIA REPAIR  WITH MESH;  Surgeon: Erroll Luna, MD;  Location: [place];  Service: General;  Laterality: Left;   INTRAOPERATIVE ARTERIOGRAM Left 04/24/2013   Procedure: INTRA OPERATIVE ARTERIOGRAM;  Surgeon: Conrad Republic, MD;  Location: [place];  Service: Vascular;  Laterality: Left;   KIDNEY TRANSPLANT     LOWER EXTREMITY ANGIOGRAM Bilateral 03/09/2013   Procedure: LOWER EXTREMITY ANGIOGRAM;  Surgeon: Elam Dutch, MD;  Location: [facility] CATH LAB;  Service: Cardiovascular;  Laterality:  Bilateral;   LOWER EXTREMITY ANGIOGRAM Right 10/25/2013   Procedure: LOWER EXTREMITY ANGIOGRAM;  Surgeon: Conrad [place], MD;  Location: [facility] CATH LAB;  Service: Cardiovascular;  Laterality: Right;   LOWER EXTREMITY ANGIOGRAM Right 12/13/2013   Procedure: LOWER EXTREMITY ANGIOGRAM;  Surgeon: Conrad [place], MD;  Location: [facility] CATH LAB;  Service: Cardiovascular;  Laterality: Right;   NEPHRECTOMY RECIPIENT  2010   NEPHRECTOMY TRANSPLANTED ORGAN     PATCH ANGIOPLASTY Right 04/17/2015   Procedure: Rueben Bash PATCH ANGIOPLASTY, RIGHT ILEOFEMORAL;  Surgeon: Conrad Custer, MD;  Location: [place];  Service: Vascular;  Laterality: Right;   PERIPHERAL VASCULAR CATHETERIZATION N/A 04/10/2015   Procedure: Abdominal Aortogram;  Surgeon: Conrad Percival, MD;  Location: [place] CV LAB;  Service: Cardiovascular;  Laterality: N/A;   PERIPHERAL VASCULAR CATHETERIZATION N/A 05/10/2016   Procedure: Abdominal Aortogram w/Lower Extremity;  Surgeon: Conrad Truro, MD;  Location: [place] CV LAB;  Service: Cardiovascular;  Laterality: N/A;   PERIPHERAL VASCULAR INTERVENTION  12/03/2021   Procedure: PERIPHERAL VASCULAR INTERVENTION;  Surgeon: [name], MD;  Location: [facility] CV LAB;  Service: Cardiovascular;;   s/p cadaveric renal transplant  December 2010   Loop W/ EMBOLECTOMY Left 09/24/2015   Procedure: EXCISION OF THROMBOSED RADIOCEPHALIC ARTERIOVENOUS FISTULA;  Surgeon: Conrad [place], MD;  Location: [place];  Service: Vascular;  Laterality: Left;   UMBILICAL HERNIA REPAIR N/A 08/29/2019   Procedure: UMBILICAL HERNIA REPAIR WITH MESH;  Surgeon: Erroll Luna, MD;  Location: [place];  Service: General;  Laterality: N/A;   VEIN HARVEST Right 04/17/2015   Procedure: RIGHT GREATER [place] ;  Surgeon: Conrad , MD;  Location: [place];  Service: Vascular;  Laterality: Right;   Family History  Problem Relation Age of Onset   Hypertension Father    Other Father         amputation   Hypertension Mother     Social History   Tobacco Use   Smoking status: Former    Packs/day: 0.50    Years: 34.00    Total pack years: 17.00    Types: E-cigarettes, Cigarettes    Quit date: 02/17/2003    Years since quitting: 19.2    Passive exposure: Never   Smokeless tobacco: Former   Tobacco comments:    vapor cigrarette  Substance Use Topics   Alcohol use: Not Currently    Alcohol/week: 0.0 standard drinks of alcohol   Marital Status: Single  ROS  Review of Systems  Cardiovascular:  Negative for chest pain, dyspnea on exertion, irregular heartbeat, leg swelling, near-syncope, orthopnea and palpitations.   Objective  Blood pressure 136/73, pulse 90, height '5\' 10"'$  (1.778 m), weight 142 lb 3.2 oz (64.5 kg), SpO2 97 %. Body mass index is 20.4 kg/m.     05/28/2022    3:15 PM 01/19/2022    9:16 AM 12/03/2021    2:00 PM  Vitals with BMI  Height '5\' 10"'$  '5\' 10"'$   Weight 142 lbs 3 oz 140 lbs   BMI 90.3 00.92   Systolic 330 076 226  Diastolic 73 75 74  Pulse 90 61 58     Physical Exam Vitals reviewed.  HENT:     Head: Normocephalic and atraumatic.  Neck:     Vascular: Carotid bruit present.  Cardiovascular:     Rate and Rhythm: Normal rate and regular rhythm.     Pulses:          Femoral pulses are 2+ on the right side and 2+ on the left side.      Popliteal pulses are 2+ on the right side and 2+ on the left side.       Dorsalis pedis pulses are 0 on the right side and 0 on the left side.       Posterior tibial pulses are 0 on the right side and 0 on the left side.     Heart sounds: Murmur heard.  Pulmonary:     Effort: Pulmonary effort is normal.  Abdominal:     General: Bowel sounds are normal.  Musculoskeletal:     Right lower leg: No edema.     Left lower leg: No edema.  Skin:    General: Skin is warm and dry.  Neurological:     Mental Status: He is alert.     Medications and allergies   Allergies  Allergen Reactions   Tape Rash and  Other (See Comments)    NO Silk tape, please!!     Medication list after today's encounter   Current Outpatient Medications:    albuterol (PROVENTIL HFA;VENTOLIN HFA) 108 (90 Base) MCG/ACT inhaler, Inhale 1-2 puffs into the lungs every 6 (six) hours as needed for wheezing or shortness of breath., Disp: 1 Inhaler, Rfl: 0   aspirin EC 81 MG tablet, Take 1 tablet (81 mg total) by mouth daily. Swallow whole., Disp: 150 tablet, Rfl: 2   ciprofloxacin (CIPRO) 500 MG tablet, Take 500 mg by mouth 2 (two) times daily., Disp: , Rfl:    clopidogrel (PLAVIX) 75 MG tablet, Take 1 tablet (75 mg total) by mouth daily., Disp: 30 tablet, Rfl: 11   Cyanocobalamin (VITAMIN B-12 PO), Take 1 tablet by mouth daily., Disp: , Rfl:    ferrous sulfate 325 (65 FE) MG EC tablet, Take 1 tablet (325 mg total) by mouth 2 (two) times daily., Disp: 90 tablet, Rfl: 3   labetalol (NORMODYNE) 200 MG tablet, Take 200 mg by mouth 2 (two) times daily., Disp: , Rfl:    Multiple Vitamin (MULTIVITAMIN WITH MINERALS) TABS tablet, Take 1 tablet by mouth daily., Disp: , Rfl:    mycophenolate (CELLCEPT) 250 MG capsule, Take 250 mg by mouth 2 (two) times daily., Disp: , Rfl:    NIFEdipine (ADALAT CC) 90 MG 24 hr tablet, Take 1 tablet (90 mg total) by mouth daily., Disp: 90 tablet, Rfl: 3   rosuvastatin (CRESTOR) 5 MG tablet, TAKE 1 TABLET BY MOUTH EVERYDAY AT BEDTIME, Disp: 90 tablet, Rfl: 3   tacrolimus (PROGRAF) 1 MG capsule, Take 3-4 mg by mouth See admin instructions. Take 4 mg by mouth in the morning and 3 mg in the evening, Disp: , Rfl:    tadalafil (CIALIS) 20 MG tablet, Take 20 mg by mouth daily as needed for erectile dysfunction., Disp: , Rfl:    tamsulosin (FLOMAX) 0.4 MG CAPS capsule, Take 0.4 mg by mouth daily., Disp: , Rfl:    zolpidem (AMBIEN) 10 MG tablet, Take  10 mg by mouth at bedtime as needed for sleep., Disp: , Rfl:   Laboratory examination:   Lab Results  Component Value Date   NA 142 12/03/2021   K 3.8 12/03/2021    CO2 24 05/30/2021   GLUCOSE 100 (H) 12/03/2021   BUN 12 12/03/2021   CREATININE 1.00 12/03/2021   CALCIUM 9.9 05/30/2021   EGFR >60 03/18/2017   GFRNONAA 45 (L) 05/30/2021       Latest Ref Rng & Units 12/03/2021    7:31 AM 05/30/2021    7:56 PM 12/12/2019   10:25 AM  CMP  Glucose 70 - 99 mg/dL 100  95  114   BUN 8 - 23 mg/dL '12  19  15   '$ Creatinine 0.61 - 1.24 mg/dL 1.00  1.66  1.30   Sodium 135 - 145 mmol/L 142  136  141   Potassium 3.5 - 5.1 mmol/L 3.8  3.6  3.9   Chloride 98 - 111 mmol/L 106  103  103   CO2 22 - 32 mmol/L  24    Calcium 8.9 - 10.3 mg/dL  9.9    Total Protein 6.5 - 8.1 g/dL  7.9    Total Bilirubin 0.3 - 1.2 mg/dL  0.8    Alkaline Phos 38 - 126 U/L  61    AST 15 - 41 U/L  21    ALT 0 - 44 U/L  12        Latest Ref Rng & Units 12/03/2021    7:31 AM 05/30/2021    7:56 PM 12/12/2019   10:25 AM  CBC  WBC 4.0 - 10.5 K/uL  6.0    Hemoglobin 13.0 - 17.0 g/dL 12.6  12.5  10.2   Hematocrit 39.0 - 52.0 % 37.0  41.0  30.0   Platelets 150 - 400 K/uL  281      Lipid Panel No results for input(s): "CHOL", "TRIG", "[place]", "VLDL", "HDL", "CHOLHDL", "LDLDIRECT" in the last 8760 hours.  HEMOGLOBIN A1C No results found for: "HGBA1C", "MPG" TSH No results for input(s): "TSH" in the last 8760 hours.  External labs:     Radiology:    Cardiac Studies:    Dr. Bridgett Larsson; left iliofem-pop bypass in 2014 and right iliofem-pop bypass in 2016 and multiple revascularizations.   Lexiscan myoview stress test 05/27/2017: 1. The resting electrocardiogram demonstrated normal sinus rhythm, RAE, LAD, LAFB, normal resting conduction and no resting arrhythmias. Stress EKG is non-diagnostic for ischemia as it a pharmacologic stress using Lexiscan. Stress symptoms included dizziness. 2. Myocardial perfusion imaging is normal. Overall left ventricular systolic function was normal without regional wall motion abnormalities. The left ventricular ejection fraction was 52%. This is a low  risk study.   Carotid artery duplex  03/16/2019: Minimal stenosis in the right internal carotid artery (minimal). Stenosis in the left internal carotid artery (16-49%). Stenosis in the left common carotid artery (<50%). There is moderate diffuse plaque noted in bilateral carotid arteries (see images). Antegrade right vertebral artery flow. Antegrade left vertebral artery flow. Follow up in one year is appropriate if clinically indicated.   Echocardiogram 05/29/2019:  Left ventricle cavity is normal in size. Severe concentric hypertrophy of the left ventricle. Septum and posterior wall measures 1.7 cm. Normal global wall motion. Normal LV systolic function with EF 63%. Doppler evidence of grade I (impaired) diastolic dysfunction, normal LAP.  Left atrial cavity is mildly dilated.  Trileaflet aortic valve. Increased LVOT velocities likely due to LV hypertrophy and mild  LVOT obstruction. No valvular stenosis. Trace aortic regurgitation.  Moderate (Grade II) mitral regurgitation.  Mild to moderate tricuspid regurgitation. Estimated pulmonary artery systolic pressure is 27 mmHg.   EKG   EKG 10/03/2019: Normal sinus rhythm with rate of 91 bpm, biatrial enlargement, left axis deviation, left anterior fascicular block.  Incomplete right bundle branch block.  LVH.  No significant change from 02/14/2019.   EKG:   05/28/2022: Normal sinus rhythm with biatrial enlargement and LVH. No significant change compared to prior  Assessment     ICD-10-CM   1. PAD (peripheral artery disease) (HCC)  I73.9 EKG 12-Lead    PCV ECHOCARDIOGRAM COMPLETE    PCV CAROTID DUPLEX (BILATERAL)    2. Atherosclerosis of native artery of both lower extremities with intermittent claudication (HCC)  I70.213 EKG 12-Lead    PCV ECHOCARDIOGRAM COMPLETE    PCV CAROTID DUPLEX (BILATERAL)    3. Bilateral carotid artery stenosis  I65.23 PCV ECHOCARDIOGRAM COMPLETE    PCV CAROTID DUPLEX (BILATERAL)    4. Essential hypertension   I10 PCV ECHOCARDIOGRAM COMPLETE    PCV CAROTID DUPLEX (BILATERAL)    5. Nonspecific abnormal electrocardiogram (ECG) (EKG)  R94.31        Orders Placed This Encounter  Procedures   EKG 12-Lead   PCV ECHOCARDIOGRAM COMPLETE    Standing Status:   Future    Standing Expiration Date:   05/29/2023    Meds ordered this encounter  Medications   NIFEdipine (ADALAT CC) 90 MG 24 hr tablet    Sig: Take 1 tablet (90 mg total) by mouth daily.    Dispense:  90 tablet    Refill:  3    Medications Discontinued During This Encounter  Medication Reason   NIFEdipine (ADALAT CC) 30 MG 24 hr tablet      Recommendations:   Scott Chavez is a 67 y.o.  male with PAD, HTN, HLD  PAD (peripheral artery disease) (HCC) Atherosclerosis of native artery of both lower extremities with intermittent claudication ([place]) Patient follows with vascular surgery regarding his PAD He has had multiple interventions but denies claudication at this time   Bilateral carotid artery stenosis Repeat carotid duplex ordered   Essential hypertension Continue current cardiac medications with the following changes: increase nifedipine to '90mg'$ . Encourage low-sodium diet, less than 2000 mg daily. Enrolled in RPM monitoring program Follow-up in 3-6 months or sooner if needed.   Nonspecific abnormal electrocardiogram (ECG) (EKG) Echocardiogram ordered     Scott Flock, DO, [facility]  05/29/2022, 3:40 PM Office: [phone] Pager: [phone]

## 2022-05-28 NOTE — Telephone Encounter (Signed)
Patient home blood pressure is elevated on current regimen.   Average Systolic BP Level 546.56 mmHg Lowest Systolic BP Level 812 mmHg Highest Systolic BP Level 751 mmHg  Average Diastolic BP Level 70.01 mmHg Lowest Diastolic BP Level 85 mmHg Highest Diastolic BP Level 749 mmHg  05/24/2022 Monday at 05:40 AM 170 / 94      05/22/2022 Saturday at 11:47 AM 156 / 94      05/21/2022 Friday at 06:20 AM 137 / 89      05/20/2022 Thursday at 05:28 AM 171 / 102      05/19/2022 Wednesday at 06:50 AM 178 / 108      05/17/2022 Monday at 08:24 AM 146 / 86      05/16/2022 'Sunday at 09:29 AM 158 / 94      05/14/2022 Friday at 02:27 PM 186 / 124      05/14/2022 Friday at 10:59 AM 181 / 121      05/13/2022 Thursday at 04:49 AM 129 / 91      05/12/2022 Wednesday at 06:06 AM 140 / 85      01'$ /12/2022 Monday at 05:58 AM 176 / 105

## 2022-06-04 ENCOUNTER — Ambulatory Visit: Payer: Medicare HMO

## 2022-06-04 DIAGNOSIS — I6523 Occlusion and stenosis of bilateral carotid arteries: Secondary | ICD-10-CM

## 2022-06-04 DIAGNOSIS — I70213 Atherosclerosis of native arteries of extremities with intermittent claudication, bilateral legs: Secondary | ICD-10-CM | POA: Diagnosis not present

## 2022-06-04 DIAGNOSIS — I10 Essential (primary) hypertension: Secondary | ICD-10-CM | POA: Diagnosis not present

## 2022-06-04 DIAGNOSIS — I739 Peripheral vascular disease, unspecified: Secondary | ICD-10-CM | POA: Diagnosis not present

## 2022-06-06 NOTE — Progress Notes (Signed)
Unchanged from prior

## 2022-06-06 NOTE — Progress Notes (Signed)
Stable, will repeat in 6 months

## 2022-06-07 NOTE — Progress Notes (Signed)
Called and spoke with patient regarding his CAD results.

## 2022-06-07 NOTE — Progress Notes (Signed)
Called and spoke with patient regarding his echocardiogram results.  ?

## 2022-06-08 DIAGNOSIS — I10 Essential (primary) hypertension: Secondary | ICD-10-CM | POA: Diagnosis not present

## 2022-07-20 DIAGNOSIS — R339 Retention of urine, unspecified: Secondary | ICD-10-CM | POA: Diagnosis not present

## 2022-08-06 DIAGNOSIS — N390 Urinary tract infection, site not specified: Secondary | ICD-10-CM | POA: Diagnosis not present

## 2022-08-06 DIAGNOSIS — Z940 Kidney transplant status: Secondary | ICD-10-CM | POA: Diagnosis not present

## 2022-08-10 DIAGNOSIS — D649 Anemia, unspecified: Secondary | ICD-10-CM | POA: Diagnosis not present

## 2022-08-10 DIAGNOSIS — Z8619 Personal history of other infectious and parasitic diseases: Secondary | ICD-10-CM | POA: Diagnosis not present

## 2022-08-10 DIAGNOSIS — E7800 Pure hypercholesterolemia, unspecified: Secondary | ICD-10-CM | POA: Diagnosis not present

## 2022-08-10 DIAGNOSIS — I739 Peripheral vascular disease, unspecified: Secondary | ICD-10-CM | POA: Diagnosis not present

## 2022-08-10 DIAGNOSIS — I129 Hypertensive chronic kidney disease with stage 1 through stage 4 chronic kidney disease, or unspecified chronic kidney disease: Secondary | ICD-10-CM | POA: Diagnosis not present

## 2022-08-10 DIAGNOSIS — N319 Neuromuscular dysfunction of bladder, unspecified: Secondary | ICD-10-CM | POA: Diagnosis not present

## 2022-08-10 DIAGNOSIS — Z940 Kidney transplant status: Secondary | ICD-10-CM | POA: Diagnosis not present

## 2022-08-14 ENCOUNTER — Other Ambulatory Visit: Payer: Self-pay | Admitting: Vascular Surgery

## 2022-08-16 ENCOUNTER — Telehealth: Payer: Self-pay

## 2022-08-16 NOTE — Telephone Encounter (Signed)
Pt called to ask when appts are. He thought they were either today or tomorrow.  Reviewed pt's chart, returned call for clarification, no answer, lf vm detailing date and time of appts.

## 2022-08-19 ENCOUNTER — Ambulatory Visit (INDEPENDENT_AMBULATORY_CARE_PROVIDER_SITE_OTHER)
Admission: RE | Admit: 2022-08-19 | Discharge: 2022-08-19 | Disposition: A | Discharge status: Home / Self Care | Payer: 59 | Source: Ambulatory Visit | Attending: Vascular Surgery | Admitting: Vascular Surgery

## 2022-08-19 ENCOUNTER — Ambulatory Visit (HOSPITAL_COMMUNITY)
Admission: EM | Admit: 2022-08-19 | Discharge: 2022-08-19 | Disposition: A | Discharge status: Home / Self Care | Payer: 59 | Attending: Nurse Practitioner | Admitting: Nurse Practitioner

## 2022-08-19 ENCOUNTER — Ambulatory Visit (HOSPITAL_COMMUNITY)
Admission: RE | Admit: 2022-08-19 | Discharge: 2022-08-19 | Disposition: A | Discharge status: Home / Self Care | Payer: 59 | Source: Ambulatory Visit | Attending: Vascular Surgery | Admitting: Vascular Surgery

## 2022-08-19 ENCOUNTER — Encounter (HOSPITAL_COMMUNITY): Payer: Self-pay

## 2022-08-19 DIAGNOSIS — M79672 Pain in left foot: Secondary | ICD-10-CM | POA: Diagnosis not present

## 2022-08-19 DIAGNOSIS — I700 Atherosclerosis of aorta: Secondary | ICD-10-CM | POA: Diagnosis not present

## 2022-08-19 DIAGNOSIS — M79671 Pain in right foot: Secondary | ICD-10-CM | POA: Diagnosis not present

## 2022-08-19 DIAGNOSIS — I739 Peripheral vascular disease, unspecified: Secondary | ICD-10-CM

## 2022-08-19 DIAGNOSIS — I70213 Atherosclerosis of native arteries of extremities with intermittent claudication, bilateral legs: Secondary | ICD-10-CM | POA: Insufficient documentation

## 2022-08-19 DIAGNOSIS — I779 Disorder of arteries and arterioles, unspecified: Secondary | ICD-10-CM | POA: Diagnosis not present

## 2022-08-19 LAB — VAS US ABI WITH/WO TBI
Left ABI: 0.99
Right ABI: 0.91

## 2022-08-19 MED ORDER — CLOTRIMAZOLE 1 % EX OINT: 1.0000 | TOPICAL_OINTMENT | Freq: Two times a day (BID) | CUTANEOUS | 0 refills | Status: DC

## 2022-08-19 NOTE — Discharge Instructions (Addendum)
I have sent prescription antifungal cream to the pharmacy for you today-please use this twice a day until has resolved for more than 1 week.  Follow-up with podiatry for ongoing pain with calluses.  Contact information is below.

## 2022-08-19 NOTE — ED Triage Notes (Signed)
Patient states he has had bilateral foot pain x "1 month or more". Patient states he has a corn to the left great toe and is working with weights and on his feet at work.  Patient states he does wear compression socks. Patient denies any pain medication today.

## 2022-08-19 NOTE — ED Provider Notes (Signed)
[facility]    CSN: [id number] Arrival date & time: 08/19/22  1757      History   Chief Complaint Chief Complaint  Patient presents with   Foot Pain    HPI Scott Chavez is a 67 y.o. male.   Patient presents today for bilateral foot pain.  Reports the pain is worse with weight bearing. The pain has been ongoing for the past few weeks; no recent injury, trauma, fall, or accident involving the feet.  Reports a history of "corns" on his feet that are tender to touch.  He is also concerned about itching in between the toes and has been intermittently using athlete's foot cream without much benefit.  No new numbness/tingling in the toes.      Past Medical History:  Diagnosis Date   ADHD    Anemia    pt. not sure, but reports he is taking Fe for one month now.   Aorto-iliac disease    Bladder dysfunction    ESRD (end stage renal disease)    trans[facility]- [facility] , followed by Dr. Kathrene Bongo    GERD (gastroesophageal reflux disease)    Hemodialysis patient    "on dialysis 1998-2010" 04/25/2013)   Hepatitis C    has been treated with Harvoni   History of renal failure    Hypercholesteremia    Hypertension    Internal hemorrhoids    Colonoscopy 2010 and anoscopy 2015   Peripheral vascular disease    Pneumonia    Self-catheterizes urinary bladder    pt. choice, but he reports that he desires to do this to avoid retention     Patient Active Problem List   Diagnosis Date Noted   Acute upper GI bleeding 02/05/2019   GI bleeding 02/04/2019   AKI (acute kidney injury) 02/04/2019   Iron deficiency anemia 04/06/2017   Closed fracture of left malar bone 10/11/2016   Chronic neck pain 03/15/2016   Chronic low back pain without sciatica 03/15/2016   Nonallopathic lesion of lumbosacral region 03/15/2016   Nonallopathic lesion of sacral region 03/15/2016   Nonallopathic lesion of thoracic region 03/15/2016   Liver fibrosis 12/18/2015   Arteriovenous  fistula thrombosis 10/16/2015   Chronic hepatitis C without hepatic coma 09/03/2015   Atherosclerosis of native arteries of extremities with intermittent claudication, right leg 04/17/2015   Pain in joint, lower leg 09/13/2014   Hemorrhoids, internal, with bleeding 01/29/2014   Pain in limb 08/17/2013   Peripheral vascular disease, unspecified (HCC) 06/08/2013   Redness of skin-Calf to foot LEFT 05/25/2013   Tenderness in limb-Calf to foot LEFT 05/25/2013   Swelling of limb-Calf to foot LEFT 05/25/2013   Postoperative pain 05/02/2013   PAD (peripheral artery disease) 04/24/2013   Preop cardiovascular exam 03/27/2013   Essential hypertension 03/27/2013   Hyperlipidemia 03/27/2013   Atherosclerosis of native arteries of extremity with intermittent claudication 02/16/2013   History of renal transplant 02/16/2013   Aorto-iliac disease 10/29/2011   Chest pain 10/29/2011   Neurogenic dysfunction of the urinary bladder 05/12/2011   Deceased-donor kidney transplant recipient May 12, 2011   Immunosuppression 05-12-11    Past Surgical History:  Procedure Laterality Date   ABDOMINAL AORTAGRAM N/A 03/09/2013   Procedure: ABDOMINAL Ronny Flurry;  Surgeon: Sherren Kerns, MD;  Location: [facility] CATH LAB;  Service: Cardiovascular;  Laterality: N/A;   ABDOMINAL AORTOGRAM W/LOWER EXTREMITY N/A 12/03/2021   Procedure: ABDOMINAL AORTOGRAM W/LOWER EXTREMITY;  Surgeon: Cephus Shelling, MD;  Location: [facility];  Service: Cardiovascular;  Laterality: N/A;   AV FISTULA PLACEMENT Left 1998   "removed 2011" (04/25/2013)   AV FISTULA REPAIR Left    removal with partial vein removed   COLONOSCOPY     ENDARTERECTOMY FEMORAL Right 04/17/2015   Procedure: ENDARTERECTOMY RIGHT ILIOFEMORAL WITH PROFUNDOPLASTY;  Surgeon: Fransisco Hertz, MD;  Location: [facility] OR;  Service: Vascular;  Laterality: Right;   ENDARTERECTOMY FEMORAL Right 10/25/2016   Procedure: RIGHT ILIOFEMORAL ENDARTERECTOMY WITH BOVINE PERICARDIAL  PATCH ANGIOPLASTY;  Surgeon: Fransisco Hertz, MD;  Location: [facility] OR;  Service: Vascular;  Laterality: Right;   ESOPHAGOGASTRODUODENOSCOPY (EGD) WITH PROPOFOL N/A 02/05/2019   Procedure: ESOPHAGOGASTRODUODENOSCOPY (EGD) WITH PROPOFOL;  Surgeon: Vida Rigger, MD;  Location: [facility] ENDOSCOPY;  Service: Endoscopy;  Laterality: N/A;   FEMORAL-POPLITEAL BYPASS GRAFT Left 04/24/2013   Procedure: BYPASS GRAFT COMMON FEMORALARTERY-BELOW KNEE POPLITEAL ARTERY WITH GREATER SAPHENOUS VEIN;  Surgeon: Fransisco Hertz, MD;  Location: [facility];  Service: Vascular;  Laterality: Left;   FEMORAL-POPLITEAL BYPASS GRAFT Right 04/17/2015   Procedure: BYPASS GRAFT RIGHT FEMORALTO BELOW KNEE POPLITEAL ARTERY USING RIGHT NON REVERSED GREATER SAPPHENOUS VEIN;  Surgeon: Fransisco Hertz, MD;  Location: [facility];  Service: Vascular;  Laterality: Right;   HERNIA REPAIR  08/2019   INGUINAL HERNIA REPAIR Left 08/29/2019   Procedure: LEFT INGUINAL HERNIA REPAIR WITH MESH;  Surgeon: Harriette Bouillon, MD;  Location: [facility];  Service: General;  Laterality: Left;   INTRAOPERATIVE ARTERIOGRAM Left 04/24/2013   Procedure: INTRA OPERATIVE ARTERIOGRAM;  Surgeon: Fransisco Hertz, MD;  Location: [facility] OR;  Service: Vascular;  Laterality: Left;   KIDNEY TRANSPLANT     LOWER EXTREMITY ANGIOGRAM Bilateral 03/09/2013   Procedure: LOWER EXTREMITY ANGIOGRAM;  Surgeon: Sherren Kerns, MD;  Location: [facility] CATH LAB;  Service: Cardiovascular;  Laterality: Bilateral;   LOWER EXTREMITY ANGIOGRAM Right 10/25/2013   Procedure: LOWER EXTREMITY ANGIOGRAM;  Surgeon: Fransisco Hertz, MD;  Location: [facility] CATH LAB;  Service: Cardiovascular;  Laterality: Right;   LOWER EXTREMITY ANGIOGRAM Right 12/13/2013   Procedure: LOWER EXTREMITY ANGIOGRAM;  Surgeon: Fransisco Hertz, MD;  Location: [facility] CATH LAB;  Service: Cardiovascular;  Laterality: Right;   NEPHRECTOMY RECIPIENT  2010   NEPHRECTOMY TRANSPLANTED ORGAN     PATCH ANGIOPLASTY Right 04/17/2015   Procedure: Livia Snellen PATCH  ANGIOPLASTY, RIGHT ILEOFEMORAL;  Surgeon: Fransisco Hertz, MD;  Location: [facility] OR;  Service: Vascular;  Laterality: Right;   PERIPHERAL VASCULAR CATHETERIZATION N/A 04/10/2015   Procedure: Abdominal Aortogram;  Surgeon: Fransisco Hertz, MD;  [facility];  Service: Cardiovascular;  Laterality: N/A;   PERIPHERAL VASCULAR CATHETERIZATION N/A 05/10/2016   Procedure: Abdominal Aortogram w/Lower Extremity;  Surgeon: Fransisco Hertz, MD;  Location: [facility] INVASIVE CV LAB;  Service: Cardiovascular;  Laterality: N/A;   PERIPHERAL VASCULAR INTERVENTION  12/03/2021   Procedure: PERIPHERAL VASCULAR INTERVENTION;  Surgeon: Cephus Shelling, MD;  Location[facility];  Service: Cardiovascular;;   s/p cadaveric renal transplant  December 2010   [facility]   THROMBECTOMY W/ EMBOLECTOMY Left 09/24/2015   Procedure: EXCISION OF THROMBOSED RADIOCEPHALIC ARTERIOVENOUS FISTULA;  Surgeon: Fransisco Hertz, MD;  Location: [facility] OR;  Service: Vascular;  Laterality: Left;   UMBILICAL HERNIA REPAIR N/A 08/29/2019   Procedure: UMBILICAL HERNIA REPAIR WITH MESH;  Surgeon: Harriette Bouillon, MD;  Location: [facility];  Service: General;  Laterality: N/A;   VEIN HARVEST Right 04/17/2015   Procedure: RIGHT GREATER SAPPHENOUS VEIN HARVEST ;  Surgeon: Arlys John  Rolena Infante, MD;  Location: [facility] OR;  Service: Vascular;  Laterality: Right;       Home Medications    Prior to Admission medications   Medication Sig Start Date End Date Taking? Authorizing Provider  Clotrimazole 1 % OINT Apply 1 application  topically 2 (two) times daily. Apply to affected and surrounding area(s) twice daily until 1 week after clinical resolution, typically for 4 weeks total. 08/19/22  Yes Valentino Nose, NP  albuterol (PROVENTIL HFA;VENTOLIN HFA) 108 (90 Base) MCG/ACT inhaler Inhale 1-2 puffs into the lungs every 6 (six) hours as needed for wheezing or shortness of breath. 07/17/18   Rancour, Jeannett Senior, MD  aspirin EC 81 MG tablet Take 1  tablet (81 mg total) by mouth daily. Swallow whole. 12/03/21 12/03/22  Cephus Shelling, MD  ciprofloxacin (CIPRO) 500 MG tablet Take 500 mg by mouth 2 (two) times daily. 05/04/22   [provider]  clopidogrel (PLAVIX) 75 MG tablet TAKE 1 TABLET BY MOUTH EVERY DAY 08/15/22   Victorino Sparrow, MD  Cyanocobalamin (VITAMIN B-12 PO) Take 1 tablet by mouth daily.    [provider]  ferrous sulfate 325 (65 FE) MG EC tablet Take 1 tablet (325 mg total) by mouth 2 (two) times daily. 02/07/19   Rhetta Mura, MD  labetalol (NORMODYNE) 200 MG tablet Take 200 mg by mouth 2 (two) times daily.    [provider]  Multiple Vitamin (MULTIVITAMIN WITH MINERALS) TABS tablet Take 1 tablet by mouth daily.    [provider]  mycophenolate (CELLCEPT) 250 MG capsule Take 250 mg by mouth 2 (two) times daily.    [provider]  NIFEdipine (ADALAT CC) 90 MG 24 hr tablet Take 1 tablet (90 mg total) by mouth daily. 05/28/22   Custovic, Rozell Searing, DO  rosuvastatin (CRESTOR) 5 MG tablet TAKE 1 TABLET BY MOUTH EVERYDAY AT BEDTIME 05/05/22   Yates Decamp, MD  tacrolimus (PROGRAF) 1 MG capsule Take 3-4 mg by mouth See admin instructions. Take 4 mg by mouth in the morning and 3 mg in the evening    [provider]  tadalafil (CIALIS) 20 MG tablet Take 20 mg by mouth daily as needed for erectile dysfunction.    [provider]  tamsulosin (FLOMAX) 0.4 MG CAPS capsule Take 0.4 mg by mouth daily.    [provider]  zolpidem (AMBIEN) 10 MG tablet Take 10 mg by mouth at bedtime as needed for sleep.    [provider]    Family History Family History  Problem Relation Age of Onset   Hypertension Father    Other Father        amputation   Hypertension Mother     Social History Social History   Tobacco Use   Smoking status: Former    Packs/day: 0.50    Years: 34.00    Additional pack years: 0.00    Total pack years: 17.00    Types:  E-cigarettes, Cigarettes    Quit date: 02/17/2003    Years since quitting: 19.5    Passive exposure: Never   Smokeless tobacco: Former   Tobacco comments:    vapor cigrarette  Vaping Use   Vaping Use: Some days  Substance Use Topics   Alcohol use: Not Currently    Alcohol/week: 0.0 standard drinks of alcohol   Drug use: Not Currently    Types: Cocaine, Marijuana, Heroin, Other-see comments    Comment: pt states used TCH a few weeks ago/ 04/25/2013 "abused  in the past ; stopped  11/13/2002.....states smokes marijuana again 2021     Allergies   Tape   Review of Systems Review of Systems Per HPI  Physical Exam Triage Vital Signs ED Triage Vitals [08/19/22 1907]  Enc Vitals Group     BP (!) 170/84     Pulse Rate 72     Resp 16     Temp 98 F (36.7 C)     Temp Source Oral     SpO2 95 %     Weight      Height      Head Circumference      Peak Flow      Pain Score 8     Pain Loc      Pain Edu?      Excl. in GC?    No data found.  Updated Vital Signs BP (!) 170/84 (BP Location: Right Arm)   Pulse 72   Temp 98 F (36.7 C) (Oral)   Resp 16   SpO2 95%   Visual Acuity Right Eye Distance:   Left Eye Distance:   Bilateral Distance:    Right Eye Near:   Left Eye Near:    Bilateral Near:     Physical Exam Vitals and nursing note reviewed.  Constitutional:      General: He is not in acute distress.    Appearance: Normal appearance. He is not toxic-appearing.  HENT:     Head: Normocephalic and atraumatic.     Mouth/Throat:     Mouth: Mucous membranes are moist.     Pharynx: Oropharynx is clear.  Pulmonary:     Effort: Pulmonary effort is normal. No respiratory distress.  Feet:     Right foot:     Toenail Condition: Right toenails are abnormally thick.     Left foot:     Skin integrity: Callus (distal end of third left toe) present.     Toenail Condition: Left toenails are abnormally thick.     Comments: Wet, hypopigmented, macerated skin in between toes   Skin:    General: Skin is warm and dry.     Capillary Refill: Capillary refill takes less than 2 seconds.     Coloration: Skin is not jaundiced or pale.     Findings: No erythema.  Neurological:     Mental Status: He is alert and oriented to person, place, and time.  Psychiatric:        Behavior: Behavior is cooperative.      UC Treatments / Results  Labs (all labs ordered are listed, but only abnormal results are displayed) Labs Reviewed - No data to display  EKG   Radiology  Procedures Procedures (including critical care time)  Medications Ordered in UC Medications - No data to display  Initial Impression / Assessment and Plan / UC Course  I have reviewed the triage vital signs and the nursing notes.  Pertinent labs & imaging results that were available during my care of the patient were reviewed by me and considered in my medical decision making (see chart for details).   Patient is well-appearing, normotensive, afebrile, not tachycardic, not tachypneic, oxygenating well on room air.    1. Bilateral foot pain No callus appreciated to right foot, however there is a tender callus to the left foot Also suspicious for athlete's foot-will prescribe clotrimazole ointment Seek care for persistent or worsening symptoms despite treatment Recommended follow-up with podiatry for further evaluation and management   The patient  was given the opportunity to ask questions.  All questions answered to their satisfaction.  The patient is in agreement to this plan.    Final Clinical Impressions(s) / UC Diagnoses   Final diagnoses:  Bilateral foot pain     Discharge Instructions      I have sent prescription antifungal cream to the pharmacy for you today-please use this twice a day until has resolved for more than 1 week.  Follow-up with podiatry for ongoing pain with calluses.  Contact information is below.     ED Prescriptions     Medication Sig Dispense Auth.  Provider   Clotrimazole 1 % OINT Apply 1 application  topically 2 (two) times daily. Apply to affected and surrounding area(s) twice daily until 1 week after clinical resolution, typically for 4 weeks total. 56.7 g Valentino Nose, NP      PDMP not reviewed this encounter.   Valentino Nose, NP 08/19/22 [phone]

## 2022-09-03 ENCOUNTER — Ambulatory Visit: Payer: 59 | Admitting: Podiatry

## 2022-09-09 ENCOUNTER — Ambulatory Visit (HOSPITAL_COMMUNITY)
Admission: EM | Admit: 2022-09-09 | Discharge: 2022-09-09 | Disposition: A | Discharge status: Home / Self Care | Payer: 59 | Attending: Family Medicine | Admitting: Family Medicine

## 2022-09-09 ENCOUNTER — Encounter (HOSPITAL_COMMUNITY): Payer: Self-pay | Admitting: Emergency Medicine

## 2022-09-09 DIAGNOSIS — M542 Cervicalgia: Secondary | ICD-10-CM

## 2022-09-09 MED ORDER — CYCLOBENZAPRINE HCL 10 MG PO TABS: 10.0000 mg | ORAL_TABLET | Freq: Two times a day (BID) | ORAL | 0 refills | Status: DC | PRN
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
## 2022-09-10 ENCOUNTER — Ambulatory Visit: Payer: 59 | Admitting: Podiatry

## 2022-09-14 ENCOUNTER — Ambulatory Visit (INDEPENDENT_AMBULATORY_CARE_PROVIDER_SITE_OTHER): Payer: 59 | Admitting: Physician Assistant

## 2022-09-14 VITALS — BP 161/86 | HR 85 | Temp 98.5°F | Resp 20 | Ht 70.0 in | Wt 135.0 lb

## 2022-09-14 DIAGNOSIS — I70213 Atherosclerosis of native arteries of extremities with intermittent claudication, bilateral legs: Secondary | ICD-10-CM

## 2022-09-14 DIAGNOSIS — L97521 Non-pressure chronic ulcer of other part of left foot limited to breakdown of skin: Secondary | ICD-10-CM

## 2022-09-14 DIAGNOSIS — I739 Peripheral vascular disease, unspecified: Secondary | ICD-10-CM

## 2022-09-14 NOTE — Progress Notes (Unsigned)
Office Note     CC:  follow up Requesting Provider:  Jackie Plum, MD  HPI: Scott Chavez is a 67 y.o. (April 30, 1956) male who presents for surveillance of PAD with numerous vascular interventions of bilateral lower extremities.  Most recent intervention was left external iliac angioplasty with stent placement on 12/03/2021 by Dr. Chestine Spore.  He previously underwent bilateral common iliac stents in 2015 by Dr. Johny Drilling.  He also has left common femoral to below the knee popliteal bypass with vein in December 2014 and right iliofemoral endarterectomy with femoral to below the knee popliteal bypass with vein in 2016 all by Dr. Imogene Burn.  Based on most recent angiography he does have in-stent stenosis of the right common iliac artery however no intervention was performed by Dr. Chestine Spore after pullback pressures.  He denies any claudication or rest pain of bilateral lower extremities.  He does have a callus on his left third toe that has been slow to heal over the past several months.  He is followed by podiatry however states he does not see much improvement week to week.  He is taking his aspirin, Plavix, statin daily.  He has quit smoking however continues to vape.  He has CKD with history of transplanted kidney from off of the right iliac.   Past Medical History:  Diagnosis Date   ADHD    Anemia    pt. not sure, but reports he is taking Fe for one month now.   Aorto-iliac disease (HCC)    Bladder dysfunction    ESRD (end stage renal disease) (HCC)    transplant- [facility] , followed by Dr. Kathrene Bongo    GERD (gastroesophageal reflux disease)    Hemodialysis patient [facility])    "on dialysis 1998-2010" 04/25/2013)   Hepatitis C    has been treated with Harvoni   History of renal failure    Hypercholesteremia    Hypertension    Internal hemorrhoids    Colonoscopy 2010 and anoscopy 2015   Peripheral vascular disease (HCC)    Pneumonia    Self-catheterizes urinary bladder    pt.  choice, but he reports that he desires to do this to avoid retention     Past Surgical History:  Procedure Laterality Date   ABDOMINAL AORTAGRAM N/A 03/09/2013   Procedure: ABDOMINAL Ronny Flurry;  Surgeon: Sherren Kerns, MD;  Location: [facility] CATH LAB;  Service: Cardiovascular;  Laterality: N/A;   ABDOMINAL AORTOGRAM W/LOWER EXTREMITY N/A 12/03/2021   Procedure: ABDOMINAL AORTOGRAM W/LOWER EXTREMITY;  Surgeon: Cephus Shelling, MD;  Location: [facility] INVASIVE CV LAB;  Service: Cardiovascular;  Laterality: N/A;   AV FISTULA PLACEMENT Left 1998   "removed 2011" (04/25/2013)   AV FISTULA REPAIR Left    removal with partial vein removed   COLONOSCOPY     ENDARTERECTOMY FEMORAL Right 04/17/2015   Procedure: ENDARTERECTOMY RIGHT ILIOFEMORAL WITH PROFUNDOPLASTY;  Surgeon: Fransisco Hertz, MD;  Location: [facility] OR;  Service: Vascular;  Laterality: Right;   ENDARTERECTOMY FEMORAL Right 10/25/2016   Procedure: RIGHT ILIOFEMORAL ENDARTERECTOMY WITH BOVINE PERICARDIAL PATCH ANGIOPLASTY;  Surgeon: Fransisco Hertz, MD;  Location: [facility] OR;  Service: Vascular;  Laterality: Right;   ESOPHAGOGASTRODUODENOSCOPY (EGD) WITH PROPOFOL N/A 02/05/2019   Procedure: ESOPHAGOGASTRODUODENOSCOPY (EGD) WITH PROPOFOL;  Surgeon: Vida Rigger, MD;  Location: [facility] ENDOSCOPY;  Service: Endoscopy;  Laterality: N/A;   FEMORAL-POPLITEAL BYPASS GRAFT Left 04/24/2013   Procedure: BYPASS GRAFT COMMON FEMORALARTERY-BELOW KNEE POPLITEAL ARTERY WITH GREATER SAPHENOUS VEIN;  Surgeon: Fransisco Hertz, MD;  Location: [facility] OR;  Service: Vascular;  Laterality: Left;   FEMORAL-POPLITEAL BYPASS GRAFT Right 04/17/2015   Procedure: BYPASS GRAFT RIGHT FEMORALTO BELOW KNEE POPLITEAL ARTERY USING RIGHT NON REVERSED GREATER SAPPHENOUS VEIN;  Surgeon: Fransisco Hertz, MD;  Location: [facility] OR;  Service: Vascular;  Laterality: Right;   HERNIA REPAIR  08/2019   INGUINAL HERNIA REPAIR Left 08/29/2019   Procedure: LEFT INGUINAL HERNIA REPAIR WITH MESH;  Surgeon: Harriette Bouillon, MD;   Location: [facility];  Service: General;  Laterality: Left;   INTRAOPERATIVE ARTERIOGRAM Left 04/24/2013   Procedure: INTRA OPERATIVE ARTERIOGRAM;  Surgeon: Fransisco Hertz, MD;  Location: [facility] OR;  Service: Vascular;  Laterality: Left;   KIDNEY TRANSPLANT     LOWER EXTREMITY ANGIOGRAM Bilateral 03/09/2013   Procedure: LOWER EXTREMITY ANGIOGRAM;  Surgeon: Sherren Kerns, MD;  Location: [facility] CATH LAB;  Service: Cardiovascular;  Laterality: Bilateral;   LOWER EXTREMITY ANGIOGRAM Right 10/25/2013   Procedure: LOWER EXTREMITY ANGIOGRAM;  Surgeon: Fransisco Hertz, MD;  Location: [facility] CATH LAB;  Service: Cardiovascular;  Laterality: Right;   LOWER EXTREMITY ANGIOGRAM Right 12/13/2013   Procedure: LOWER EXTREMITY ANGIOGRAM;  Surgeon: Fransisco Hertz, MD;  Location: [facility] CATH LAB;  Service: Cardiovascular;  Laterality: Right;   NEPHRECTOMY RECIPIENT  2010   NEPHRECTOMY TRANSPLANTED ORGAN     PATCH ANGIOPLASTY Right 04/17/2015   Procedure: Livia Snellen PATCH ANGIOPLASTY, RIGHT ILEOFEMORAL;  Surgeon: Fransisco Hertz, MD;  Location: [facility] OR;  Service: Vascular;  Laterality: Right;   PERIPHERAL VASCULAR CATHETERIZATION N/A 04/10/2015   Procedure: Abdominal Aortogram;  Surgeon: Fransisco Hertz, MD;  Location: [facility] INVASIVE CV LAB;  Service: Cardiovascular;  Laterality: N/A;   PERIPHERAL VASCULAR CATHETERIZATION N/A 05/10/2016   Procedure: Abdominal Aortogram w/Lower Extremity;  Surgeon: Fransisco Hertz, MD;  Location: [facility] INVASIVE CV LAB;  Service: Cardiovascular;  Laterality: N/A;   PERIPHERAL VASCULAR INTERVENTION  12/03/2021   Procedure: PERIPHERAL VASCULAR INTERVENTION;  Surgeon: Cephus Shelling, MD;  Location: [facility] INVASIVE CV LAB;  Service: Cardiovascular;;   s/p cadaveric renal transplant  December 2010   [facility]   THROMBECTOMY W/ EMBOLECTOMY Left 09/24/2015   Procedure: EXCISION OF THROMBOSED RADIOCEPHALIC ARTERIOVENOUS FISTULA;  Surgeon: Fransisco Hertz, MD;  Location: [facility] OR;  Service: Vascular;  Laterality: Left;    UMBILICAL HERNIA REPAIR N/A 08/29/2019   Procedure: UMBILICAL HERNIA REPAIR WITH MESH;  Surgeon: Harriette Bouillon, MD;  Location: [facility];  Service: General;  Laterality: N/A;   VEIN HARVEST Right 04/17/2015   Procedure: RIGHT GREATER SAPPHENOUS VEIN HARVEST ;  Surgeon: Fransisco Hertz, MD;  Location: [facility] OR;  Service: Vascular;  Laterality: Right;    Social History   Socioeconomic History   Marital status: Single    Spouse name: Not on file   Number of children: 3   Years of education: Not on file   Highest education level: Not on file  Occupational History   Occupation: student   Occupation: [employer] driver  Tobacco Use   Smoking status: Former    Packs/day: 0.50    Years: 34.00    Additional pack years: 0.00    Total pack years: 17.00    Types: E-cigarettes, Cigarettes    Quit date: 02/17/2003    Years since quitting: 19.5    Passive exposure: Never   Smokeless tobacco: Former   Tobacco comments:    vapor cigrarette  Vaping Use   Vaping Use: Some days  Substance and Sexual Activity   Alcohol  use: Not Currently    Alcohol/week: 0.0 standard drinks of alcohol   Drug use: Not Currently    Types: Cocaine, Marijuana, Heroin, Other-see comments    Comment: pt states used TCH a few weeks ago/ 04/25/2013 "abused in the past ; stopped  11/13/2002.....states smokes marijuana again 2021   Sexual activity: Not on file  Other Topics Concern   Not on file  Social History Narrative   Patient reports he is divorced. 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 As of  2015.   On disability otherwise, he is a vapor smoker, 3 caffeinated beverages daily   Social Determinants of Health   Financial Resource Strain: Not on file  Food Insecurity: Not on file  Transportation Needs: Not on file  Physical Activity: Not on file  Stress: Not on file  Social Connections: Not on file  Intimate Partner Violence: Not on file    Family History  Problem Relation Age of Onset    Hypertension Father    Other Father        amputation   Hypertension Mother     Current Outpatient Medications  Medication Sig Dispense Refill   albuterol (PROVENTIL HFA;VENTOLIN HFA) 108 (90 Base) MCG/ACT inhaler Inhale 1-2 puffs into the lungs every 6 (six) hours as needed for wheezing or shortness of breath. 1 Inhaler 0   aspirin EC 81 MG tablet Take 1 tablet (81 mg total) by mouth daily. Swallow whole. 150 tablet 2   clopidogrel (PLAVIX) 75 MG tablet TAKE 1 TABLET BY MOUTH EVERY DAY 90 tablet 3   Clotrimazole 1 % OINT Apply 1 application  topically 2 (two) times daily. Apply to affected and surrounding area(s) twice daily until 1 week after clinical resolution, typically for 4 weeks total. 56.7 g 0   Cyanocobalamin (VITAMIN B-12 PO) Take 1 tablet by mouth daily.     cyclobenzaprine (FLEXERIL) 10 MG tablet Take 1 tablet (10 mg total) by mouth 2 (two) times daily as needed for muscle spasms. 20 tablet 0   ferrous sulfate 325 (65 FE) MG EC tablet Take 1 tablet (325 mg total) by mouth 2 (two) times daily. 90 tablet 3   labetalol (NORMODYNE) 200 MG tablet Take 200 mg by mouth 2 (two) times daily.     Multiple Vitamin (MULTIVITAMIN WITH MINERALS) TABS tablet Take 1 tablet by mouth daily.     mycophenolate (CELLCEPT) 250 MG capsule Take 250 mg by mouth 2 (two) times daily.     NIFEdipine (ADALAT CC) 90 MG 24 hr tablet Take 1 tablet (90 mg total) by mouth daily. 90 tablet 3   rosuvastatin (CRESTOR) 5 MG tablet TAKE 1 TABLET BY MOUTH EVERYDAY AT BEDTIME 90 tablet 3   tacrolimus (PROGRAF) 1 MG capsule Take 3-4 mg by mouth See admin instructions. Take 4 mg by mouth in the morning and 3 mg in the evening     tadalafil (CIALIS) 20 MG tablet Take 20 mg by mouth daily as needed for erectile dysfunction.     tamsulosin (FLOMAX) 0.4 MG CAPS capsule Take 0.4 mg by mouth daily.     zolpidem (AMBIEN) 10 MG tablet Take 10 mg by mouth at bedtime as needed for sleep.     No current facility-administered  medications for this visit.    Allergies  Allergen Reactions   Tape Rash and Other (See Comments)    NO Silk tape, please!!     REVIEW OF SYSTEMS:   [X]  denotes positive finding, [ ]   denotes negative finding Cardiac  Comments:  Chest pain or chest pressure:    Shortness of breath upon exertion:    Short of breath when lying flat:    Irregular heart rhythm:        Vascular    Pain in calf, thigh, or hip brought on by ambulation:    Pain in feet at night that wakes you up from your sleep:     Blood clot in your veins:    Leg swelling:         Pulmonary    Oxygen at home:    Productive cough:     Wheezing:         Neurologic    Sudden weakness in arms or legs:     Sudden numbness in arms or legs:     Sudden onset of difficulty speaking or slurred speech:    Temporary loss of vision in one eye:     Problems with dizziness:         Gastrointestinal    Blood in stool:     Vomited blood:         Genitourinary    Burning when urinating:     Blood in urine:        Psychiatric    Major depression:         Hematologic    Bleeding problems:    Problems with blood clotting too easily:        Skin    Rashes or ulcers:        Constitutional    Fever or chills:      PHYSICAL EXAMINATION:  Vitals:   09/14/22 1535  BP: (!) 161/86  Pulse: 85  Resp: 20  Temp: 98.5 F (36.9 C)  TempSrc: Temporal  SpO2: 94%  Weight: 135 lb (61.2 kg)  Height: 5\' 10"  (1.778 m)    General:  WDWN in NAD; vital signs documented above Gait: Not observed HENT: WNL, normocephalic Pulmonary: normal non-labored breathing , without Rales, rhonchi,  wheezing Cardiac: regular HR Abdomen: soft, NT, no masses Skin: without rashes Vascular Exam/Pulses: He has absent pedal pulses on exam today however has brisk DP and PT signals by Doppler Extremities: No wounds right foot; left ankle edema with varicosities; left third toe tip with callus Musculoskeletal: no muscle wasting or  atrophy  Neurologic: A&O X 3 Psychiatric:  The pt has Normal affect.   Non-Invasive Vascular Imaging:    ABI/TBIToday's ABIToday's TBIPrevious ABIPrevious TBI  +-------+-----------+-----------+------------+------------+  Right 0.91       0.54       0.78        0.54          +-------+-----------+-----------+------------+------------+  Left  0.99       0.56       0.99        0.60           Aortoiliac duplex demonstrates mid right common iliac stent with 455 cm/s.  393 cm/s distal stent  Left common iliac stent patent however left external iliac artery stent with 392 cm/s in the distal stent and 428 cm/s distal to the stent  Bilateral lower extremity femoral-popliteal bypasses are patent however low flow velocities throughout both in the 30s and 40s    ASSESSMENT/PLAN:: 67 y.o. male here for follow up for surveillance of PAD with history of bilateral iliac stents and femoral-popliteal bypasses  -Patient denies any rest pain or claudication of bilateral lower extremities however does have  a nonhealing left third toe tip callus/pressure wound that he has been dealing with for several months.  He does not have any palpable pedal pulses however does have brisk DP and PT signals bilaterally.  Duplex findings are concerning bilaterally with in-stent stenosis of the right common iliac artery which were previously documented in prior aortogram.  He also has velocities into the 400s distal to the left external iliac artery.  It should also be noted he has low flow velocities throughout both bypass grafts likely due to to the inflow stenosis bilaterally.  Imaging studies were reviewed with Dr. Chestine Spore.  Plan will be to proceed with aortogram via right common femoral artery access.  Plan will be possible intervention of right common iliac artery stenosis with balloon angioplasty or stenting, left iliac artery balloon angioplasty or stenting, and possibly left femoral to popliteal bypass  intervention.  I explained to the patient that this will be performed in the Cath Lab.  He will not be under general anesthesia.  He will continue his aspirin, Plavix, statin daily.  This will be scheduled with Dr. Chestine Spore on 09/30/2022.  The patient was agreeable to proceed after the procedure was described in detail and risks were reviewed.   Emilie Rutter, PA-C [facility] [phone]  Clinic MD:   Chestine Spore

## 2022-09-15 ENCOUNTER — Encounter: Payer: Self-pay | Admitting: Physician Assistant

## 2022-09-15 ENCOUNTER — Telehealth: Payer: Self-pay

## 2022-09-15 NOTE — Telephone Encounter (Signed)
Left VM for patient to return call, to schedule aortogram.

## 2022-09-20 NOTE — Telephone Encounter (Signed)
Left VM for patient to return call

## 2022-09-21 ENCOUNTER — Other Ambulatory Visit: Payer: Self-pay

## 2022-09-21 DIAGNOSIS — L97521 Non-pressure chronic ulcer of other part of left foot limited to breakdown of skin: Secondary | ICD-10-CM

## 2022-09-21 DIAGNOSIS — I70213 Atherosclerosis of native arteries of extremities with intermittent claudication, bilateral legs: Secondary | ICD-10-CM

## 2022-09-21 NOTE — Telephone Encounter (Signed)
Patient returned call. Procedure scheduled for 5/30 at [facility]. Instructions provided and he verbalized understanding.

## 2022-09-29 ENCOUNTER — Ambulatory Visit (INDEPENDENT_AMBULATORY_CARE_PROVIDER_SITE_OTHER): Payer: 59

## 2022-09-29 ENCOUNTER — Ambulatory Visit (INDEPENDENT_AMBULATORY_CARE_PROVIDER_SITE_OTHER): Payer: 59 | Admitting: Podiatry

## 2022-09-29 DIAGNOSIS — I739 Peripheral vascular disease, unspecified: Secondary | ICD-10-CM

## 2022-09-29 DIAGNOSIS — L84 Corns and callosities: Secondary | ICD-10-CM

## 2022-09-29 DIAGNOSIS — M2041 Other hammer toe(s) (acquired), right foot: Secondary | ICD-10-CM | POA: Diagnosis not present

## 2022-09-29 DIAGNOSIS — M2042 Other hammer toe(s) (acquired), left foot: Secondary | ICD-10-CM | POA: Diagnosis not present

## 2022-09-29 DIAGNOSIS — M778 Other enthesopathies, not elsewhere classified: Secondary | ICD-10-CM

## 2022-09-29 NOTE — Progress Notes (Signed)
Subjective:   Patient ID: Scott Chavez, male   DOB: 67 y.o.   MRN: [id number]   HPI Patient presents stating that a lot of pain in his third digit of his left foot has flatfoot deformity bilateral and apparently is having a vascular stent procedure left leg tomorrow and states this toe has been very tender.  Patient does not currently smoke and is not active   Review of Systems  All other systems reviewed and are negative.       Objective:  Physical Exam Vitals and nursing note reviewed.  Constitutional:      Appearance: He is well-developed.  Pulmonary:     Effort: Pulmonary effort is normal.  Musculoskeletal:        General: Normal range of motion.  Skin:    General: Skin is warm.  Neurological:     Mental Status: He is alert.     Vascular status indicates nonpalpable pulses DP PT bilateral with digital perfusion which appears to be adequate mild coolness to his feet with distal lateral keratotic lesion digit 3 left very painful when pressed over right with patient not being a good historian but apparently having vascular work done tomorrow     Assessment:  Structural deformity digital abnormalities with distal keratotic lesion digit 3 left that is painful when pressed and digital deformities in general both feet with vascular disease     Plan:  H&P reviewed everything and I did a sterile sharp debridement of lesion third digit left and applied cushioning to the toe and then tried to explain to him that with vascular work that he may get perfused in a better fashion and be able to have less pain.  Hard to make that determination currently as he is due to have a procedure tomorrow  X-rays indicate that there is digital deformities mild osteoporosis arthritis no other pathology noted

## 2022-09-30 ENCOUNTER — Ambulatory Visit (HOSPITAL_COMMUNITY)
Admission: RE | Admit: 2022-09-30 | Discharge: 2022-09-30 | Disposition: A | Discharge status: Home / Self Care | Payer: 59 | Attending: Vascular Surgery | Admitting: Vascular Surgery

## 2022-09-30 DIAGNOSIS — L97521 Non-pressure chronic ulcer of other part of left foot limited to breakdown of skin: Secondary | ICD-10-CM

## 2022-09-30 DIAGNOSIS — Z539 Procedure and treatment not carried out, unspecified reason: Secondary | ICD-10-CM | POA: Diagnosis not present

## 2022-09-30 DIAGNOSIS — I70213 Atherosclerosis of native arteries of extremities with intermittent claudication, bilateral legs: Secondary | ICD-10-CM

## 2022-09-30 MED ORDER — SODIUM CHLORIDE 0.9 % IV SOLN: INTRAVENOUS | Status: DC

## 2022-09-30 NOTE — Progress Notes (Signed)
Patient reports drinking 3-4 ounces of coffee with "light" cream at 0830 this morning. Victorino Dike, RN informed. Dr. Chestine Spore in to see patient at this time 0905 and informed patient that he will be cancelled today. Verbalized understanding. Patient DC home.

## 2022-10-07 ENCOUNTER — Other Ambulatory Visit: Payer: Self-pay

## 2022-10-07 ENCOUNTER — Ambulatory Visit (HOSPITAL_COMMUNITY)
Admission: RE | Admit: 2022-10-07 | Discharge: 2022-10-07 | Disposition: A | Discharge status: Home / Self Care | Payer: 59 | Attending: Vascular Surgery | Admitting: Vascular Surgery

## 2022-10-07 ENCOUNTER — Encounter (HOSPITAL_COMMUNITY)
Admission: RE | Disposition: A | Discharge status: Home / Self Care | Payer: Self-pay | Source: Home / Self Care | Attending: Vascular Surgery

## 2022-10-07 DIAGNOSIS — N186 End stage renal disease: Secondary | ICD-10-CM | POA: Diagnosis not present

## 2022-10-07 DIAGNOSIS — T82858A Stenosis of vascular prosthetic devices, implants and grafts, initial encounter: Secondary | ICD-10-CM | POA: Diagnosis not present

## 2022-10-07 DIAGNOSIS — I739 Peripheral vascular disease, unspecified: Secondary | ICD-10-CM

## 2022-10-07 DIAGNOSIS — L97529 Non-pressure chronic ulcer of other part of left foot with unspecified severity: Secondary | ICD-10-CM | POA: Diagnosis not present

## 2022-10-07 DIAGNOSIS — F17290 Nicotine dependence, other tobacco product, uncomplicated: Secondary | ICD-10-CM | POA: Diagnosis not present

## 2022-10-07 DIAGNOSIS — Y832 Surgical operation with anastomosis, bypass or graft as the cause of abnormal reaction of the patient, or of later complication, without mention of misadventure at the time of the procedure: Secondary | ICD-10-CM | POA: Diagnosis not present

## 2022-10-07 DIAGNOSIS — Z940 Kidney transplant status: Secondary | ICD-10-CM | POA: Diagnosis not present

## 2022-10-07 DIAGNOSIS — Z7902 Long term (current) use of antithrombotics/antiplatelets: Secondary | ICD-10-CM | POA: Diagnosis not present

## 2022-10-07 DIAGNOSIS — I70213 Atherosclerosis of native arteries of extremities with intermittent claudication, bilateral legs: Secondary | ICD-10-CM | POA: Insufficient documentation

## 2022-10-07 DIAGNOSIS — Z79899 Other long term (current) drug therapy: Secondary | ICD-10-CM | POA: Insufficient documentation

## 2022-10-07 DIAGNOSIS — Z7982 Long term (current) use of aspirin: Secondary | ICD-10-CM | POA: Diagnosis not present

## 2022-10-07 DIAGNOSIS — I120 Hypertensive chronic kidney disease with stage 5 chronic kidney disease or end stage renal disease: Secondary | ICD-10-CM | POA: Insufficient documentation

## 2022-10-07 DIAGNOSIS — I70245 Atherosclerosis of native arteries of left leg with ulceration of other part of foot: Secondary | ICD-10-CM | POA: Diagnosis not present

## 2022-10-07 HISTORY — PX: PERIPHERAL VASCULAR BALLOON ANGIOPLASTY: CATH118281

## 2022-10-07 HISTORY — PX: ABDOMINAL AORTOGRAM W/LOWER EXTREMITY: CATH118223

## 2022-10-07 LAB — POCT I-STAT, CHEM 8
BUN: 15 mg/dL (ref 8–23)
Calcium, Ion: 1.28 mmol/L (ref 1.15–1.40)
Chloride: 104 mmol/L (ref 98–111)
Creatinine, Ser: 1.1 mg/dL (ref 0.61–1.24)
Glucose, Bld: 94 mg/dL (ref 70–99)
HCT: 41 % (ref 39.0–52.0)
Hemoglobin: 13.9 g/dL (ref 13.0–17.0)
Potassium: 4.2 mmol/L (ref 3.5–5.1)
Sodium: 141 mmol/L (ref 135–145)
TCO2: 31 mmol/L (ref 22–32)

## 2022-10-07 SURGERY — ABDOMINAL AORTOGRAM W/LOWER EXTREMITY
Anesthesia: LOCAL

## 2022-10-07 MED ORDER — FENTANYL CITRATE (PF) 100 MCG/2ML IJ SOLN
INTRAMUSCULAR | Status: DC | PRN
  Administered 2022-10-07 (×3): 25 ug via INTRAVENOUS

## 2022-10-07 MED ORDER — SODIUM CHLORIDE 0.9 % IV SOLN: 250.0000 mL | INTRAVENOUS | Status: DC | PRN

## 2022-10-07 MED ORDER — LIDOCAINE HCL (PF) 1 % IJ SOLN
INTRAMUSCULAR | Status: AC
  Filled 2022-10-07: qty 30

## 2022-10-07 MED ORDER — ONDANSETRON HCL 4 MG/2ML IJ SOLN
INTRAMUSCULAR | Status: AC
  Filled 2022-10-07: qty 2

## 2022-10-07 MED ORDER — SODIUM CHLORIDE 0.9% FLUSH: 3.0000 mL | Freq: Two times a day (BID) | INTRAVENOUS | Status: DC

## 2022-10-07 MED ORDER — SODIUM CHLORIDE 0.9 % IV SOLN: INTRAVENOUS | Status: DC

## 2022-10-07 MED ORDER — HYDRALAZINE HCL 20 MG/ML IJ SOLN: 5.0000 mg | INTRAMUSCULAR | Status: DC | PRN

## 2022-10-07 MED ORDER — ONDANSETRON HCL 4 MG/2ML IJ SOLN
INTRAMUSCULAR | Status: DC | PRN
  Administered 2022-10-07: 4 mg via INTRAVENOUS

## 2022-10-07 MED ORDER — FENTANYL CITRATE (PF) 100 MCG/2ML IJ SOLN
INTRAMUSCULAR | Status: AC
  Filled 2022-10-07: qty 2

## 2022-10-07 MED ORDER — MIDAZOLAM HCL 2 MG/2ML IJ SOLN
INTRAMUSCULAR | Status: DC | PRN
  Administered 2022-10-07 (×3): 1 mg via INTRAVENOUS

## 2022-10-07 MED ORDER — HEPARIN (PORCINE) IN NACL 1000-0.9 UT/500ML-% IV SOLN
INTRAVENOUS | Status: DC | PRN
  Administered 2022-10-07 (×2): 500 mL

## 2022-10-07 MED ORDER — ONDANSETRON HCL 4 MG/2ML IJ SOLN: 4.0000 mg | Freq: Four times a day (QID) | INTRAMUSCULAR | Status: DC | PRN

## 2022-10-07 MED ORDER — MIDAZOLAM HCL 2 MG/2ML IJ SOLN
INTRAMUSCULAR | Status: AC
  Filled 2022-10-07: qty 2

## 2022-10-07 MED ORDER — LIDOCAINE HCL (PF) 1 % IJ SOLN
INTRAMUSCULAR | Status: DC | PRN
  Administered 2022-10-07 (×2): 15 mL

## 2022-10-07 MED ORDER — HEPARIN SODIUM (PORCINE) 1000 UNIT/ML IJ SOLN
INTRAMUSCULAR | Status: DC | PRN
  Administered 2022-10-07: 6000 [IU] via INTRAVENOUS

## 2022-10-07 MED ORDER — LABETALOL HCL 5 MG/ML IV SOLN: 10.0000 mg | INTRAVENOUS | Status: DC | PRN

## 2022-10-07 MED ORDER — ACETAMINOPHEN 325 MG PO TABS: 650.0000 mg | ORAL_TABLET | ORAL | Status: DC | PRN

## 2022-10-07 MED ORDER — SODIUM CHLORIDE 0.9% FLUSH: 3.0000 mL | INTRAVENOUS | Status: DC | PRN

## 2022-10-07 SURGICAL SUPPLY — 18 items
BALLN IN.PACT DCB 7X40 (BALLOONS) ×2
CATH OMNI FLUSH 5F 65CM (CATHETERS) IMPLANT
DCB IN.PACT 7X40 (BALLOONS) IMPLANT
DEVICE CLOSURE MYNXGRIP 6/7F (Vascular Products) IMPLANT
GLIDEWIRE ADV .035X260CM (WIRE) IMPLANT
KIT ANGIASSIST CO2 SYSTEM (KITS) IMPLANT
KIT ENCORE 26 ADVANTAGE (KITS) IMPLANT
KIT MICROPUNCTURE NIT STIFF (SHEATH) IMPLANT
KIT PV (KITS) ×2 IMPLANT
SHEATH CATAPULT 6FR 45 (SHEATH) IMPLANT
SHEATH CATAPULT 7FR 45 (SHEATH) IMPLANT
SHEATH PINNACLE 5F 10CM (SHEATH) IMPLANT
SHEATH PINNACLE 7F 10CM (SHEATH) IMPLANT
STOPCOCK MORSE 400PSI 3WAY (MISCELLANEOUS) IMPLANT
TRANSDUCER W/STOPCOCK (MISCELLANEOUS) ×2 IMPLANT
TRAY PV CATH (CUSTOM PROCEDURE TRAY) ×2 IMPLANT
TUBING CIL FLEX 10 FLL-RA (TUBING) IMPLANT
WIRE STARTER BENTSON 035X150 (WIRE) IMPLANT

## 2022-10-07 NOTE — Op Note (Signed)
Patient name: CORMICK SHEFFIELD MRN: [id number] DOB: Mar 13, 1956 Sex: male  10/07/2022 Pre-operative Diagnosis: Concern for high-grade stenosis in right common iliac artery stent as well as left external iliac artery stent with decreased sluggish velocities in bilateral lower extremity bypass grafts Post-operative diagnosis:  Same Surgeon:  Cephus Shelling, MD Procedure Performed: 1.  Ultrasound-guided access right common femoral artery 2.  CO2 aortogram with catheter selection of aorta 3.  Bilateral iliac artery arteriogram 4.  Left external iliac artery angioplasty with drug-coated balloon for in-stent stenosis (7 mm x 40 mm drug-coated Impact) 5.  Right common iliac artery angioplasty with drug-coated balloon for in-stent stenosis (8 mm x 40 mm drug-coated Lutonix) 6.  Mynx closure of the right common femoral artery 7.  53 minutes of monitored moderate conscious sedation time  Indications: 67 year old male who recently presented for surveillance.  He has undergone bilateral common iliac artery stents in 2015 by Dr. Imogene Burn as well as bilateral common femoral to below-knee popliteal bypasses with vein also with Dr. Imogene Burn.  I performed a left external iliac angioplasty with stenting in the past.  Recent duplex imaging suggest high-grade stenosis of right common iliac artery stent and left external iliac artery stent with sluggish flow in the bypass grafts distally.  He presents after risk benefits discussed.  Findings:   Ultrasound-guided access right common femoral artery.  Initial aortogram was with CO2 given his functioning kidney transplant.  We had difficult time visualizing any obvious stenosis given overlying bowel gas.  The infrarenal aorta was patent.  Both common iliac arteries were patent.  We could see a left external iliac artery in-stent stenosis over 60%.  I then crossed the aortic bifurcation with a 7 French Catapault sheath and the left external iliac in-stent stenosis was  treated with a 7 mm x 40 mm drug-coated Impact for 3 minutes.  There was some residual calcium on the outside of the artery here but the stent has less than 30% residual stenosis at completion.  The common femoral artery distally was widely patent with a patent bypass.  The right common iliac stent actually looked patent.  We did a pullback pressure and it dropped 25 mmHg from the distal aorta to the distal extent of the right common iliac artery stent.  We considered this flow-limiting.  This was then treated with an 8 mm x 40 mm drug-coated Lutonix for 3 minutes.  Mynx closure in the right groin.   Procedure:  The patient was identified in the holding area and taken to room 8.  The patient was then placed supine on the table and prepped and draped in the usual sterile fashion.  A time out was called.  Ultrasound was used to evaluate the right common femoral artery.  It was patent .  A digital ultrasound image was acquired.  A micropuncture needle was used to access the right common femoral artery under ultrasound guidance.  An 018 wire was advanced without resistance and a micropuncture sheath was placed.  The 018 wire was removed and a benson wire was placed.  The micropuncture sheath was exchanged for a 5 french sheath.  An omniflush catheter was advanced over the wire to the level of L-1.  An abdominal angiogram was obtained.  Next with a Omni Flush catheter and Glidewire advantage crossed the aortic bifurcation and got my wire through the left external iliac in-stent stenosis.  I upsized to a 7 [place] Catapault sheath in the right groin over the  aortic bifurcation.  The patient was then given 100 units/kg IV heparin.  We treated the left external iliac in-stent stenosis with 7 mm x 40 mm drug-coated Impact to nominal pressure for 3 minutes.  This was better with less than 30% residual stenosis.  There are some calcium outside the artery.  The left common iliac artery stent was widely patent .  I did a an  injection into the right common iliac artery stent and this also looked good however duplex imaging suggested velocity over 400.  I did a pullback pressure and it showed pressure  difference of >25 mmHg.  We considered this a significant pressure gradient.  I then treated the right common iliac artery in-stent stenosis with an 8 mm x 40 mm drug-coated Lutonix to nominal pressure for 3 minutes.    We really could not visualize the runoff in the bypasses with CO2.  I elected not to give him more contrast giving a functioning kidney transplant.  Mynx closure device was to place in the right groin after we put a short 7 French sheath in.  Plan: Continue aspirin Plavix statin.  Will arrange follow-up in 1 month with noninvasive imaging.      Cephus Shelling, MD [facility] [place] Office: [phone]

## 2022-10-07 NOTE — H&P (Signed)
History and Physical Interval Note:  10/07/2022 11:19 AM  Scott Chavez  has presented today for surgery, with the diagnosis of Atherosclerosis of native artery of both lower extremities with intermittent claudication ,Skin ulcer of toe of left foot.  The various methods of treatment have been discussed with the patient and family. After consideration of risks, benefits and other options for treatment, the patient has consented to  Procedure(s): ABDOMINAL AORTOGRAM W/LOWER EXTREMITY (N/A) as a surgical intervention.  The patient's history has been reviewed, patient examined, no change in status, stable for surgery.  I have reviewed the patient's chart and labs.  Questions were answered to the patient's satisfaction.     Cephus Shelling   Office Note        CC:  follow up Requesting Provider:  Jackie Plum, MD   HPI: Scott Chavez is a 67 y.o. (08/08/1955) male who presents for surveillance of PAD with numerous vascular interventions of bilateral lower extremities.  Most recent intervention was left external iliac angioplasty with stent placement on 12/03/2021 by Dr. Chestine Spore.  He previously underwent bilateral common iliac stents in 2015 by Dr. Johny Drilling.  He also has left common femoral to below the knee popliteal bypass with vein in December 2014 and right iliofemoral endarterectomy with femoral to below the knee popliteal bypass with vein in 2016 all by Dr. Imogene Burn.  Based on most recent angiography he does have in-stent stenosis of the right common iliac artery however no intervention was performed by Dr. Chestine Spore after pullback pressures.   He denies any claudication or rest pain of bilateral lower extremities.  He does have a callus on his left third toe that has been slow to heal over the past several months.  He is followed by podiatry however states he does not see much improvement week to week.  He is taking his aspirin, Plavix, statin daily.  He has quit smoking however continues to vape.   He has CKD with history of transplanted kidney from off of the right iliac.         Past Medical History:  Diagnosis Date   ADHD     Anemia      pt. not sure, but reports he is taking Fe for one month now.   Aorto-iliac disease (HCC)     Bladder dysfunction     ESRD (end stage renal disease) (HCC)      [facility] followed by Dr. Kathrene Bongo    GERD (gastroesophageal reflux disease)     Hemodialysis patient [facility])      "on dialysis 1998-2010" 04/25/2013)   Hepatitis C      has been treated with Harvoni   History of renal failure     Hypercholesteremia     Hypertension     Internal hemorrhoids      Colonoscopy 2010 and anoscopy 2015   Peripheral vascular disease (HCC)     Pneumonia     Self-catheterizes urinary bladder      pt. choice, but he reports that he desires to do this to avoid retention            Past Surgical History:  Procedure Laterality Date   ABDOMINAL AORTAGRAM N/A 03/09/2013    Procedure: ABDOMINAL Ronny Flurry;  Surgeon: Sherren Kerns, MD;  Location: [facility] CATH LAB;  Service: Cardiovascular;  Laterality: N/A;   ABDOMINAL AORTOGRAM W/LOWER EXTREMITY N/A 12/03/2021    Procedure: ABDOMINAL AORTOGRAM W/LOWER EXTREMITY;  Surgeon: Cephus Shelling, MD;  Location: [facility] INVASIVE CV LAB;  Service: Cardiovascular;  Laterality: N/A;   AV FISTULA PLACEMENT Left 1998    "removed 2011" (04/25/2013)   AV FISTULA REPAIR Left      removal with partial vein removed   COLONOSCOPY       ENDARTERECTOMY FEMORAL Right 04/17/2015    Procedure: ENDARTERECTOMY RIGHT ILIOFEMORAL WITH PROFUNDOPLASTY;  Surgeon: Fransisco Hertz, MD;  Location: [facility] OR;  Service: Vascular;  Laterality: Right;   ENDARTERECTOMY FEMORAL Right 10/25/2016    Procedure: RIGHT ILIOFEMORAL ENDARTERECTOMY WITH BOVINE PERICARDIAL PATCH ANGIOPLASTY;  Surgeon: Fransisco Hertz, MD;  Location: [facility] OR;  Service: Vascular;  Laterality: Right;   ESOPHAGOGASTRODUODENOSCOPY (EGD) WITH PROPOFOL N/A 02/05/2019     Procedure: ESOPHAGOGASTRODUODENOSCOPY (EGD) WITH PROPOFOL;  Surgeon: Vida Rigger, MD;  Location: [facility] ENDOSCOPY;  Service: Endoscopy;  Laterality: N/A;   FEMORAL-POPLITEAL BYPASS GRAFT Left 04/24/2013    Procedure: BYPASS GRAFT COMMON FEMORALARTERY-BELOW KNEE POPLITEAL ARTERY WITH GREATER SAPHENOUS VEIN;  Surgeon: Fransisco Hertz, MD;  Location: [facility] OR;  Service: Vascular;  Laterality: Left;   FEMORAL-POPLITEAL BYPASS GRAFT Right 04/17/2015    Procedure: BYPASS GRAFT RIGHT FEMORALTO BELOW KNEE POPLITEAL ARTERY USING RIGHT NON REVERSED GREATER SAPPHENOUS VEIN;  Surgeon: Fransisco Hertz, MD;  Location: [facility] OR;  Service: Vascular;  Laterality: Right;   HERNIA REPAIR   08/2019   INGUINAL HERNIA REPAIR Left 08/29/2019    Procedure: LEFT INGUINAL HERNIA REPAIR WITH MESH;  Surgeon: Harriette Bouillon, MD;  Location:  SURGERY CENTER;  Service: General;  Laterality: Left;   INTRAOPERATIVE ARTERIOGRAM Left 04/24/2013    Procedure: INTRA OPERATIVE ARTERIOGRAM;  Surgeon: Fransisco Hertz, MD;  Location: [facility] OR;  Service: Vascular;  Laterality: Left;   KIDNEY TRANSPLANT       LOWER EXTREMITY ANGIOGRAM Bilateral 03/09/2013    Procedure: LOWER EXTREMITY ANGIOGRAM;  Surgeon: Sherren Kerns, MD;  Location: [facility] CATH LAB;  Service: Cardiovascular;  Laterality: Bilateral;   LOWER EXTREMITY ANGIOGRAM Right 10/25/2013    Procedure: LOWER EXTREMITY ANGIOGRAM;  Surgeon: Fransisco Hertz, MD;  Location: [facility] CATH LAB;  Service: Cardiovascular;  Laterality: Right;   LOWER EXTREMITY ANGIOGRAM Right 12/13/2013    Procedure: LOWER EXTREMITY ANGIOGRAM;  Surgeon: Fransisco Hertz, MD;  Location: [facility] C[facility] LAB;  Service: Cardiovascular;  Laterality: Right;   NEPHRECTOMY RECIPIENT   2010   NEPHRECTOMY TRANSPLANTED ORGAN       PATCH ANGIOPLASTY Right 04/17/2015    Procedure: Livia Snellen PATCH ANGIOPLASTY, RIGHT ILEOFEMORAL;  Surgeon: Fransisco Hertz, MD;  Location: [facility] OR;  Service: Vascular;  Laterality: Right;   PERIPHERAL VASCULAR CATHETERIZATION N/A  04/10/2015    Procedure: Abdominal Aortogram;  Surgeon: Fransisco Hertz, MD;  Location: [facility] INVASIVE CV LAB;  Service: Cardiovascular;  Laterality: N/A;   PERIPHERAL VASCULAR CATHETERIZATION N/A 05/10/2016    Procedure: Abdominal Aortogram w/Lower Extremity;  Surgeon: Fransisco Hertz, MD;  Location: [facility] INVASIVE CV LAB;  Service: Cardiovascular;  Laterality: N/A;   PERIPHERAL VASCULAR INTERVENTION   12/03/2021    Procedure: PERIPHERAL VASCULAR INTERVENTION;  Surgeon: Cephus Shelling, MD;  Location: [facility] INVASIVE CV LAB;  Service: Cardiovascular;;   s/p cadaveric renal transplant   December 2010    [facility]   THROMBECTOMY W/ EMBOLECTOMY Left 09/24/2015    Procedure: EXCISION OF THROMBOSED RADIOCEPHALIC ARTERIOVENOUS FISTULA;  Surgeon: Fransisco Hertz, MD;  Location: [facility] OR;  Service: Vascular;  Laterality: Left;   UMBILICAL HERNIA REPAIR N/A 08/29/2019    Procedure: UMBILICAL HERNIA REPAIR WITH  MESH;  Surgeon: Harriette Bouillon, MD;  Location: [facility];  Service: General;  Laterality: N/A;   VEIN HARVEST Right 04/17/2015    Procedure: RIGHT GREATER SAPPHENOUS VEIN HARVEST ;  Surgeon: Fransisco Hertz, MD;  Location: [facility] OR;  Service: Vascular;  Laterality: Right;      Social History         Socioeconomic History   Marital status: Single      Spouse name: Not on file   Number of children: 3   Years of education: Not on file   Highest education level: Not on file  Occupational History   Occupation: student   Occupation: [employer] driver  Tobacco Use   Smoking status: Former      Packs/day: 0.50      Years: 34.00      Additional pack years: 0.00      Total pack years: 17.00      Types: E-cigarettes, Cigarettes      Quit date: 02/17/2003      Years since quitting: 19.5      Passive exposure: Never   Smokeless tobacco: Former   Tobacco comments:      vapor cigrarette  Vaping Use   Vaping Use: Some days  Substance and Sexual Activity   Alcohol use: Not Currently      Alcohol/week: 0.0  standard drinks of alcohol   Drug use: Not Currently      Types: Cocaine, Marijuana, Heroin, Other-see comments      Comment: pt states used TCH a few weeks ago/ 04/25/2013 "abused in the past ; stopped  11/13/2002.....states smokes marijuana again 2021   Sexual activity: Not on file  Other Topics Concern   Not on file  Social History Narrative    Patient reports he is divorced. 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 As of  2015.    On disability otherwise, he is a vapor smoker, 3 caffeinated beverages daily    Social Determinants of Health    Financial Resource Strain: Not on file  Food Insecurity: Not on file  Transportation Needs: Not on file  Physical Activity: Not on file  Stress: Not on file  Social Connections: Not on file  Intimate Partner Violence: Not on file           Family History  Problem Relation Age of Onset   Hypertension Father     Other Father          amputation   Hypertension Mother              Current Outpatient Medications  Medication Sig Dispense Refill   albuterol (PROVENTIL HFA;VENTOLIN HFA) 108 (90 Base) MCG/ACT inhaler Inhale 1-2 puffs into the lungs every 6 (six) hours as needed for wheezing or shortness of breath. 1 Inhaler 0   aspirin EC 81 MG tablet Take 1 tablet (81 mg total) by mouth daily. Swallow whole. 150 tablet 2   clopidogrel (PLAVIX) 75 MG tablet TAKE 1 TABLET BY MOUTH EVERY DAY 90 tablet 3   Clotrimazole 1 % OINT Apply 1 application  topically 2 (two) times daily. Apply to affected and surrounding area(s) twice daily until 1 week after clinical resolution, typically for 4 weeks total. 56.7 g 0   Cyanocobalamin (VITAMIN B-12 PO) Take 1 tablet by mouth daily.       cyclobenzaprine (FLEXERIL) 10 MG tablet Take 1 tablet (10 mg total) by mouth 2 (two) times daily as needed for muscle spasms.  20 tablet 0   ferrous sulfate 325 (65 FE) MG EC tablet Take 1 tablet (325 mg total) by mouth 2 (two) times daily. 90 tablet 3   labetalol (NORMODYNE)  200 MG tablet Take 200 mg by mouth 2 (two) times daily.       Multiple Vitamin (MULTIVITAMIN WITH MINERALS) TABS tablet Take 1 tablet by mouth daily.       mycophenolate (CELLCEPT) 250 MG capsule Take 250 mg by mouth 2 (two) times daily.       NIFEdipine (ADALAT CC) 90 MG 24 hr tablet Take 1 tablet (90 mg total) by mouth daily. 90 tablet 3   rosuvastatin (CRESTOR) 5 MG tablet TAKE 1 TABLET BY MOUTH EVERYDAY AT BEDTIME 90 tablet 3   tacrolimus (PROGRAF) 1 MG capsule Take 3-4 mg by mouth See admin instructions. Take 4 mg by mouth in the morning and 3 mg in the evening       tadalafil (CIALIS) 20 MG tablet Take 20 mg by mouth daily as needed for erectile dysfunction.       tamsulosin (FLOMAX) 0.4 MG CAPS capsule Take 0.4 mg by mouth daily.       zolpidem (AMBIEN) 10 MG tablet Take 10 mg by mouth at bedtime as needed for sleep.        No current facility-administered medications for this visit.           Allergies  Allergen Reactions   Tape Rash and Other (See Comments)      NO Silk tape, please!!        REVIEW OF SYSTEMS:    [X]  denotes positive finding, [ ]  denotes negative finding Cardiac   Comments:  Chest pain or chest pressure:      Shortness of breath upon exertion:      Short of breath when lying flat:      Irregular heart rhythm:             Vascular      Pain in calf, thigh, or hip brought on by ambulation:      Pain in feet at night that wakes you up from your sleep:       Blood clot in your veins:      Leg swelling:              Pulmonary      Oxygen at home:      Productive cough:       Wheezing:              Neurologic      Sudden weakness in arms or legs:       Sudden numbness in arms or legs:       Sudden onset of difficulty speaking or slurred speech:      Temporary loss of vision in one eye:       Problems with dizziness:              Gastrointestinal      Blood in stool:       Vomited blood:              Genitourinary      Burning when urinating:        Blood in urine:             Psychiatric      Major depression:              Hematologic      Bleeding problems:  Problems with blood clotting too easily:             Skin      Rashes or ulcers:             Constitutional      Fever or chills:          PHYSICAL EXAMINATION:      Vitals:    09/14/22 1535  BP: (!) 161/86  Pulse: 85  Resp: 20  Temp: 98.5 F (36.9 C)  TempSrc: Temporal  SpO2: 94%  Weight: 135 lb (61.2 kg)  Height: 5\' 10"  (1.778 m)      General:  WDWN in NAD; vital signs documented above Gait: Not observed HENT: WNL, normocephalic Pulmonary: normal non-labored breathing , without Rales, rhonchi,  wheezing Cardiac: regular HR Abdomen: soft, NT, no masses Skin: without rashes Vascular Exam/Pulses: He has absent pedal pulses on exam today however has brisk DP and PT signals by Doppler Extremities: No wounds right foot; left ankle edema with varicosities; left third toe tip with callus Musculoskeletal: no muscle wasting or atrophy       Neurologic: A&O X 3 Psychiatric:  The pt has Normal affect.     Non-Invasive Vascular Imaging:     ABI/TBIToday's ABIToday's TBIPrevious ABIPrevious TBI  +-------+-----------+-----------+------------+------------+  Right 0.91       0.54       0.78        0.54          +-------+-----------+-----------+------------+------------+  Left  0.99       0.56       0.99        0.60            Aortoiliac duplex demonstrates mid right common iliac stent with 455 cm/s.  393 cm/s distal stent   Left common iliac stent patent however left external iliac artery stent with 392 cm/s in the distal stent and 428 cm/s distal to the stent   Bilateral lower extremity femoral-popliteal bypasses are patent however low flow velocities throughout both in the 30s and 40s       ASSESSMENT/PLAN:: 67 y.o. male here for follow up for surveillance of PAD with history of bilateral iliac stents and femoral-popliteal  bypasses   -Patient denies any rest pain or claudication of bilateral lower extremities however does have a nonhealing left third toe tip callus/pressure wound that he has been dealing with for several months.  He does not have any palpable pedal pulses however does have brisk DP and PT signals bilaterally.  Duplex findings are concerning bilaterally with in-stent stenosis of the right common iliac artery which were previously documented in prior aortogram.  He also has velocities into the 400s distal to the left external iliac artery.  It should also be noted he has low flow velocities throughout both bypass grafts likely due to to the inflow stenosis bilaterally.  Imaging studies were reviewed with Dr. Chestine Spore.  Plan will be to proceed with aortogram via right common femoral artery access.  Plan will be possible intervention of right common iliac artery stenosis with balloon angioplasty or stenting, left iliac artery balloon angioplasty or stenting, and possibly left femoral to popliteal bypass intervention.  I explained to the patient that this will be performed in the Cath Lab.  He will not be under general anesthesia.  He will continue his aspirin, Plavix, statin daily.  This will be scheduled with Dr. Chestine Spore on 09/30/2022.  The patient was agreeable to proceed after the  procedure was described in detail and risks were reviewed.     Emilie Rutter, PA-C [facility] [phone]   Clinic MD:   Chestine Spore

## 2022-10-08 ENCOUNTER — Encounter (HOSPITAL_COMMUNITY): Payer: Self-pay | Admitting: Vascular Surgery

## 2022-10-27 ENCOUNTER — Other Ambulatory Visit: Payer: Self-pay | Admitting: *Deleted

## 2022-10-27 DIAGNOSIS — I739 Peripheral vascular disease, unspecified: Secondary | ICD-10-CM

## 2022-10-27 DIAGNOSIS — I70213 Atherosclerosis of native arteries of extremities with intermittent claudication, bilateral legs: Secondary | ICD-10-CM

## 2022-10-27 DIAGNOSIS — I779 Disorder of arteries and arterioles, unspecified: Secondary | ICD-10-CM

## 2022-11-11 ENCOUNTER — Ambulatory Visit (HOSPITAL_COMMUNITY)
Admission: RE | Admit: 2022-11-11 | Discharge: 2022-11-11 | Disposition: A | Discharge status: Home / Self Care | Payer: No Typology Code available for payment source | Source: Ambulatory Visit | Attending: Vascular Surgery | Admitting: Vascular Surgery

## 2022-11-11 ENCOUNTER — Ambulatory Visit (INDEPENDENT_AMBULATORY_CARE_PROVIDER_SITE_OTHER)
Admission: RE | Admit: 2022-11-11 | Discharge: 2022-11-11 | Disposition: A | Discharge status: Home / Self Care | Payer: No Typology Code available for payment source | Source: Ambulatory Visit | Attending: Vascular Surgery | Admitting: Vascular Surgery

## 2022-11-11 DIAGNOSIS — I70213 Atherosclerosis of native arteries of extremities with intermittent claudication, bilateral legs: Secondary | ICD-10-CM | POA: Diagnosis not present

## 2022-11-11 DIAGNOSIS — I779 Disorder of arteries and arterioles, unspecified: Secondary | ICD-10-CM | POA: Insufficient documentation

## 2022-11-11 DIAGNOSIS — I739 Peripheral vascular disease, unspecified: Secondary | ICD-10-CM | POA: Diagnosis present

## 2022-11-11 LAB — VAS US ABI WITH/WO TBI
Left ABI: 0.71
Right ABI: 0.86

## 2022-11-16 ENCOUNTER — Ambulatory Visit (INDEPENDENT_AMBULATORY_CARE_PROVIDER_SITE_OTHER): Payer: No Typology Code available for payment source | Admitting: Physician Assistant

## 2022-11-16 VITALS — BP 129/65 | HR 84 | Temp 98.3°F | Resp 18 | Ht 69.0 in | Wt 132.2 lb

## 2022-11-16 DIAGNOSIS — I739 Peripheral vascular disease, unspecified: Secondary | ICD-10-CM
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
## 2022-11-24 ENCOUNTER — Other Ambulatory Visit: Payer: Self-pay

## 2022-11-24 DIAGNOSIS — I739 Peripheral vascular disease, unspecified: Secondary | ICD-10-CM

## 2022-11-24 DIAGNOSIS — I779 Disorder of arteries and arterioles, unspecified: Secondary | ICD-10-CM

## 2022-11-26 ENCOUNTER — Ambulatory Visit: Payer: No Typology Code available for payment source | Admitting: Cardiology

## 2022-11-26 ENCOUNTER — Encounter: Payer: Self-pay | Admitting: Cardiology

## 2022-11-26 VITALS — BP 170/80 | HR 66 | Resp 16 | Ht 69.0 in | Wt 133.0 lb

## 2022-11-26 DIAGNOSIS — I6522 Occlusion and stenosis of left carotid artery: Secondary | ICD-10-CM | POA: Diagnosis not present

## 2022-11-26 DIAGNOSIS — E7800 Pure hypercholesterolemia, unspecified: Secondary | ICD-10-CM

## 2022-11-26 DIAGNOSIS — I739 Peripheral vascular disease, unspecified: Secondary | ICD-10-CM | POA: Diagnosis not present

## 2022-11-26 DIAGNOSIS — I10 Essential (primary) hypertension: Secondary | ICD-10-CM

## 2022-11-26 DIAGNOSIS — Z720 Tobacco use: Secondary | ICD-10-CM | POA: Diagnosis not present

## 2022-11-26 MED ORDER — ROSUVASTATIN CALCIUM 10 MG PO TABS: 10.0000 mg | ORAL_TABLET | Freq: Every day | ORAL | 3 refills | Status: DC

## 2022-11-26 MED ORDER — LOSARTAN POTASSIUM 25 MG PO TABS
25.0000 mg | ORAL_TABLET | Freq: Every evening | ORAL | 3 refills | Status: DC
Start: 2022-11-26 — End: 2023-10-07

## 2022-11-26 MED ORDER — NEBIVOLOL HCL 20 MG PO TABS
20.0000 mg | ORAL_TABLET | ORAL | 1 refills | Status: DC
Start: 2022-11-26 — End: 2023-05-24

## 2022-11-26 NOTE — Progress Notes (Signed)
Primary Physician/Referring:  Jackie Plum, MD  Patient ID: Scott Chavez, male    DOB: 1955-07-12, 67 y.o.   MRN: [id number]  Chief Complaint  Patient presents with   Atherosclerosis of native artery of both lower extremities    Follow-up    6 month   HPI:    Scott Chavez  is a 67 y.o. male with history of renal transplant in 2010, PAD with bilateral iliac artery stenting and bilateral femoropopliteal bypass surgery in the past, HTN, and HLD who is here for a follow-up visit.  Due to recurrent claudication symptoms, he underwent repeat peripheral arteriogram by vascular surgery on 10/07/2022 and underwent bilateral iliac artery angioplasty for in-stent restenosis.  Vascular disease Belling followed by surgeons.  Patient denies chest pain, shortness of breath, palpitations, diaphoresis, syncope, edema, PND, orthopnea.   Past Medical History:  Diagnosis Date   ADHD    Anemia    pt. not sure, but reports he is taking Fe for one month now.   Aorto-iliac disease (HCC)    Bladder dysfunction    ESRD (end stage renal disease) (HCC)    [facility] , followed by Dr. Kathrene Bongo    GERD (gastroesophageal reflux disease)    Hemodialysis patient [facility])    "on dialysis 1998-2010" 04/25/2013)   Hepatitis C    has been treated with Harvoni   History of renal failure    Hypercholesteremia    Hypertension    Internal hemorrhoids    Colonoscopy 2010 and anoscopy 2015   Peripheral vascular disease (HCC)    Pneumonia    Self-catheterizes urinary bladder    pt. choice, but he reports that he desires to do this to avoid retention    Past Surgical History:  Procedure Laterality Date   ABDOMINAL AORTAGRAM N/A 03/09/2013   Procedure: ABDOMINAL Ronny Flurry;  Surgeon: Sherren Kerns, MD;  Location: [facility] CATH LAB;  Service: Cardiovascular;  Laterality: N/A;   ABDOMINAL AORTOGRAM W/LOWER EXTREMITY N/A 12/03/2021   Procedure: ABDOMINAL AORTOGRAM W/LOWER EXTREMITY;   Surgeon: Cephus Shelling, MD;  Location: [facility] INVASIVE CV LAB;  Service: Cardiovascular;  Laterality: N/A;   ABDOMINAL AORTOGRAM W/LOWER EXTREMITY N/A 10/07/2022   Procedure: ABDOMINAL AORTOGRAM W/LOWER EXTREMITY;  Surgeon: Cephus Shelling, MD;  Location: [facility] INVASIVE CV LAB;  Service: Cardiovascular;  Laterality: N/A;   AV FISTULA PLACEMENT Left 1998   "removed 2011" (04/25/2013)   AV FISTULA REPAIR Left    removal with partial vein removed   COLONOSCOPY     ENDARTERECTOMY FEMORAL Right 04/17/2015   Procedure: ENDARTERECTOMY RIGHT ILIOFEMORAL WITH PROFUNDOPLASTY;  Surgeon: Fransisco Hertz, MD;  Location: [facility] OR;  Service: Vascular;  Laterality: Right;   ENDARTERECTOMY FEMORAL Right 10/25/2016   Procedure: RIGHT ILIOFEMORAL ENDARTERECTOMY WITH BOVINE PERICARDIAL PATCH ANGIOPLASTY;  Surgeon: Fransisco Hertz, MD;  Location: [facility] OR;  Service: Vascular;  Laterality: Right;   ESOPHAGOGASTRODUODENOSCOPY (EGD) WITH PROPOFOL N/A 02/05/2019   Procedure: ESOPHAGOGASTRODUODENOSCOPY (EGD) WITH PROPOFOL;  Surgeon: Vida Rigger, MD;  Location: [facility] ENDOSCOPY;  Service: Endoscopy;  Laterality: N/A;   FEMORAL-POPLITEAL BYPASS GRAFT Left 04/24/2013   Procedure: BYPASS GRAFT COMMON FEMORALARTERY-BELOW KNEE POPLITEAL ARTERY WITH GREATER SAPHENOUS VEIN;  Surgeon: Fransisco Hertz, MD;  Location: [facility] OR;  Service: Vascular;  Laterality: Left;   FEMORAL-POPLITEAL BYPASS GRAFT Right 04/17/2015   Procedure: BYPASS GRAFT RIGHT FEMORALTO BELOW KNEE POPLITEAL ARTERY USING RIGHT NON REVERSED GREATER SAPPHENOUS VEIN;  Surgeon: Fransisco Hertz, MD;  Location: [facility] OR;  Service:  Vascular;  Laterality: Right;   HERNIA REPAIR  08/2019   INGUINAL HERNIA REPAIR Left 08/29/2019   Procedure: LEFT INGUINAL HERNIA REPAIR WITH MESH;  Surgeon: Harriette Bouillon, MD;  Location: [facility];  Service: General;  Laterality: Left;   INTRAOPERATIVE ARTERIOGRAM Left 04/24/2013   Procedure: INTRA OPERATIVE ARTERIOGRAM;  Surgeon: Fransisco Hertz, MD;   Location: [facility] OR;  Service: Vascular;  Laterality: Left;   KIDNEY TRANSPLANT     LOWER EXTREMITY ANGIOGRAM Bilateral 03/09/2013   Procedure: LOWER EXTREMITY ANGIOGRAM;  Surgeon: Sherren Kerns, MD;  Location: [facility] CATH LAB;  Service: Cardiovascular;  Laterality: Bilateral;   LOWER EXTREMITY ANGIOGRAM Right 10/25/2013   Procedure: LOWER EXTREMITY ANGIOGRAM;  Surgeon: Fransisco Hertz, MD;  Location: [facility] CATH LAB;  Service: Cardiovascular;  Laterality: Right;   LOWER EXTREMITY ANGIOGRAM Right 12/13/2013   Procedure: LOWER EXTREMITY ANGIOGRAM;  Surgeon: Fransisco Hertz, MD;  Location: [facility] CATH LAB;  Service: Cardiovascular;  Laterality: Right;   NEPHRECTOMY RECIPIENT  2010   NEPHRECTOMY TRANSPLANTED ORGAN     PATCH ANGIOPLASTY Right 04/17/2015   Procedure: Livia Snellen PATCH ANGIOPLASTY, RIGHT ILEOFEMORAL;  Surgeon: Fransisco Hertz, MD;  Location: [facility] OR;  Service: Vascular;  Laterality: Right;   PERIPHERAL VASCULAR BALLOON ANGIOPLASTY Left 10/07/2022   Procedure: PERIPHERAL VASCULAR BALLOON ANGIOPLASTY;  Surgeon: Cephus Shelling, MD;  [facility]: [facility] INVAS[facility] CV LAB;  Service: Cardiovascular;  Laterality: Left;  Ext Iliac   PERIPHERAL VASCULAR CATHETERIZATION N/A 04/10/2015   Procedure: Abdominal Aortogram;  Surgeon: Fransisco Hertz, MD;  Location: [facility] INVASIVE CV LAB;  Service: Cardiovascular;  Laterality: N/A;   PERIPHERAL VASCULAR CATHETERIZATION N/A 05/10/2016   Procedure: Abdominal Aortogram w/Lower Extremity;  Surgeon: Fransisco Hertz, MD;  Location: [facility] INVASIVE CV LAB;  Service: Cardiovascular;  Laterality: N/A;   PERIPHERAL VASCULAR INTERVENTION  12/03/2021   Procedure: PERIPHERAL VASCULAR INTERVENTION;  Surgeon: Cephus Shelling, MD;  Location: [facility] INVASIVE CV LAB;  Service: Cardiovascular;;   s/p cadaveric renal transplant  December 2010   [facility]   THROMBECTOMY W/ EMBOLECTOMY Left 09/24/2015   Procedure: EXCISION OF THROMBOSED RADIOCEPHALIC ARTERIOVENOUS FISTULA;  Surgeon: Fransisco Hertz, MD;  Location: [facility] OR;   Service: Vascular;  Laterality: Left;   UMBILICAL HERNIA REPAIR N/A 08/29/2019   Procedure: UMBILICAL HERNIA REPAIR WITH MESH;  Surgeon: Harriette Bouillon, MD;  Location: [facility];  Service: General;  Laterality: N/A;   VEIN HARVEST Right 04/17/2015   Procedure: RIGHT GREATER SAPPHENOUS VEIN HARVEST ;  Surgeon: Fransisco Hertz, MD;  Location: [facility] OR;  Service: Vascular;  Laterality: Right;   Family History  Problem Relation Age of Onset   Hypertension Father    Other Father        amputation   Hypertension Mother     Social History   Tobacco Use   Smoking status: Former    Current packs/day: 0.00    Average packs/day: 0.5 packs/day for 34.0 years (17.0 ttl pk-yrs)    Types: E-cigarettes, Cigarettes    Start date: 02/16/1969    Quit date: 02/17/2003    Years since quitting: 19.7    Passive exposure: Never   Smokeless tobacco: Former   Tobacco comments:    vapor cigrarette  Substance Use Topics   Alcohol use: Not Currently    Alcohol/week: 0.0 standard drinks of alcohol   Marital Status: Single  ROS  ROS Objective  Blood pressure (!) 170/80, pulse 66, resp. rate 16, height 5\' 9"  (1.753 m),  weight 133 lb (60.3 kg), SpO2 99%. Body mass index is 19.64 kg/m.     11/26/2022    3:21 PM 11/26/2022    2:33 PM 11/26/2022    2:24 PM  Vitals with BMI  Height   5\' 9"   Weight   133 lbs  BMI   19.63  Systolic 170 170 161  Diastolic 80 84 97  Pulse  66 72     Physical Exam Neck:     Vascular: Carotid bruit (left) present. No JVD.  Cardiovascular:     Rate and Rhythm: Normal rate and regular rhythm.     Pulses:          Popliteal pulses are 0 on the right side and 0 on the left side.       Dorsalis pedis pulses are 0 on the right side and 0 on the left side.     Heart sounds: Normal heart sounds. No murmur heard.    No gallop.  Pulmonary:     Effort: Pulmonary effort is normal.     Breath sounds: Normal breath sounds.  Abdominal:     General: Bowel sounds are  normal.     Palpations: Abdomen is soft.  Musculoskeletal:     Right lower leg: No edema.     Left lower leg: No edema.     Laboratory examination:   Lab Results  Component Value Date   NA 141 10/07/2022   K 4.2 10/07/2022   CO2 24 05/30/2021   GLUCOSE 94 10/07/2022   BUN 15 10/07/2022   CREATININE 1.10 10/07/2022   CALCIUM 9.9 05/30/2021   EGFR >60 03/18/2017   GFRNONAA 45 (L) 05/30/2021       Latest Ref Rng & Units 10/07/2022   10:01 AM 12/03/2021    7:31 AM 05/30/2021    7:56 PM  CMP  Glucose 70 - 99 mg/dL 94  096  95   BUN 8 - 23 mg/dL 15  12  19    Creatinine 0.61 - 1.24 mg/dL 0.45  4.09  8.11   Sodium 135 - 145 mmol/L 141  142  136   Potassium 3.5 - 5.1 mmol/L 4.2  3.8  3.6   Chloride 98 - 111 mmol/L 104  106  103   CO2 22 - 32 mmol/L   24   Calcium 8.9 - 10.3 mg/dL   9.9   Total Protein 6.5 - 8.1 g/dL   7.9   Total Bilirubin 0.3 - 1.2 mg/dL   0.8   Alkaline Phos 38 - 126 U/L   61   AST 15 - 41 U/L   21   ALT 0 - 44 U/L   12       Latest Ref Rng & Units 10/07/2022   10:01 AM 12/03/2021    7:31 AM 05/30/2021    7:56 PM  CBC  WBC 4.0 - 10.5 K/uL   6.0   Hemoglobin 13.0 - 17.0 g/dL 91.4  78.2  95.6   Hematocrit 39.0 - 52.0 % 41.0  37.0  41.0   Platelets 150 - 400 K/uL   281    External labs:   Cholesterol, total 157.000 M 08/06/2022 HDL 78.000 MG 08/06/2022 LDL 65.000 MG 11/30/2021 Triglycerides 53.000 MG 08/06/2022  A1C 5.900 % 10/09/2019 TSH 1.330 10/20/2018  Hemoglobin 13.900 g/d 10/07/2022 Platelets 223.000 X1 08/06/2022  Creatinine, Serum 1.100 mg/ 10/07/2022 Potassium 4.200 mm 10/07/2022 Magnesium 1.800 MG/ 08/06/2022 ALT (SGPT) 13.000 IU/ 08/06/2022  Radiology:  Cardiac Studies:    Dr. Imogene Burn; fem-pop bypass in 2014 and right fem-pop bypass in 2016 and multiple revascularizations and history of bilateral iliac artery stents.  Bilateral iliac angioplasty with stents on 10/07/2022   Lexiscan myoview stress test 05/27/2017: 1. The resting electrocardiogram  demonstrated normal sinus rhythm, RAE, LAD, LAFB, normal resting conduction and no resting arrhythmias. Stress EKG is non-diagnostic for ischemia as it a pharmacologic stress using Lexiscan. Stress symptoms included dizziness. 2. Myocardial perfusion imaging is normal. Overall left ventricular systolic function was normal without regional wall motion abnormalities. The left ventricular ejection fraction was 52%. This is a low risk study.    Echocardiogram 05/29/2019:  Left ventricle cavity is normal in size. Severe concentric hypertrophy of the left ventricle. Septum and posterior wall measures 1.7 cm. Normal global wall motion. Normal LV systolic function with EF 63%. Doppler evidence of grade I (impaired) diastolic dysfunction, normal LAP.  Left atrial cavity is mildly dilated.  Trileaflet aortic valve. Increased LVOT velocities likely due to LV hypertrophy and mild LVOT obstruction. No valvular stenosis. Trace aortic regurgitation.  Moderate (Grade II) mitral regurgitation.  Mild to moderate tricuspid regurgitation. Estimated pulmonary artery systolic pressure is 27 mmHg.  Carotid artery duplex 06/04/2022: Duplex suggests stenosis in the right internal carotid artery (1-15%). No significant stenosis in the right external carotid artery. Duplex suggests stenosis in the left internal carotid artery (50-69%). No significant stenosis in the left external carotid artery. Antegrade right vertebral artery flow. Antegrade left vertebral artery flow. Follow up in six months is appropriate if clinically indicated.  Compared to 03/16/2019, there is progression of left ICA stenosis from <50%.   EKG   EKG 11/26/2022: Normal sinus rhythm with rate of 60 beats found, left atrial enlargement, normal axis.  Incomplete right bundle branch block.  LVH.    Medications and allergies   Allergies  Allergen Reactions   Tape Rash and Other (See Comments)    NO Silk tape, please!!    Current Outpatient  Medications:    aspirin EC 81 MG tablet, Take 1 tablet (81 mg total) by mouth daily. Swallow whole., Disp: 150 tablet, Rfl: 2   clopidogrel (PLAVIX) 75 MG tablet, Take 75 mg by mouth daily., Disp: , Rfl:    Clotrimazole 1 % OINT, Apply 1 application  topically 2 (two) times daily. Apply to affected and surrounding area(s) twice daily until 1 week after clinical resolution, typically for 4 weeks total., Disp: 56.7 g, Rfl: 0   ferrous sulfate 325 (65 FE) MG EC tablet, Take 1 tablet (325 mg total) by mouth 2 (two) times daily., Disp: 90 tablet, Rfl: 3   losartan (COZAAR) 25 MG tablet, Take 1 tablet (25 mg total) by mouth every evening., Disp: 90 tablet, Rfl: 3   Multiple Vitamin (MULTIVITAMIN WITH MINERALS) TABS tablet, Take 1 tablet by mouth daily., Disp: , Rfl:    mycophenolate (CELLCEPT) 250 MG capsule, Take 250 mg by mouth 2 (two) times daily., Disp: , Rfl:    Nebivolol HCl (BYSTOLIC) 20 MG TABS, Take 1 tablet (20 mg total) by mouth every morning., Disp: 90 tablet, Rfl: 1   NIFEdipine (ADALAT CC) 90 MG 24 hr tablet, Take 1 tablet (90 mg total) by mouth daily., Disp: 90 tablet, Rfl: 3   tacrolimus (PROGRAF) 1 MG capsule, Take 3-4 mg by mouth See admin instructions. Take 4 mg by mouth in the morning and 3 mg in the evening, Disp: , Rfl:    tadalafil (CIALIS) 20 MG tablet,  Take 20 mg by mouth daily as needed for erectile dysfunction., Disp: , Rfl:    tamsulosin (FLOMAX) 0.4 MG CAPS capsule, Take 0.4 mg by mouth daily., Disp: , Rfl:    zolpidem (AMBIEN) 10 MG tablet, Take 10 mg by mouth at bedtime as needed for sleep., Disp: , Rfl:    Cyanocobalamin (VITAMIN B-12 PO), Take 1 tablet by mouth daily. (Patient not taking: Reported on 11/26/2022), Disp: , Rfl:    rosuvastatin (CRESTOR) 10 MG tablet, Take 1 tablet (10 mg total) by mouth daily., Disp: 90 tablet, Rfl: 3   EKG:   EKG 11/26/2022: Normal sinus rhythm with rate of 60 beats found, left atrial enlargement, normal axis.  Incomplete right bundle  branch block.  LVH.  Assessment     ICD-10-CM   1. Claudication in peripheral vascular disease (HCC)  I73.9     2. Asymptomatic stenosis of left carotid artery  I65.22 PCV CAROTID DUPLEX (BILATERAL)    3. Essential hypertension  I10 EKG 12-Lead    Nebivolol HCl (BYSTOLIC) 20 MG TABS    losartan (COZAAR) 25 MG tablet    4. Pure hypercholesterolemia  E78.00 rosuvastatin (CRESTOR) 10 MG tablet    Lipid Panel With LDL/HDL Ratio    5. Tobacco use  Z72.0        Orders Placed This Encounter  Procedures   Lipid Panel With LDL/HDL Ratio   EKG 12-Lead    Meds ordered this encounter  Medications   rosuvastatin (CRESTOR) 10 MG tablet    Sig: Take 1 tablet (10 mg total) by mouth daily.    Dispense:  90 tablet    Refill:  3   Nebivolol HCl (BYSTOLIC) 20 MG TABS    Sig: Take 1 tablet (20 mg total) by mouth every morning.    Dispense:  90 tablet    Refill:  1   losartan (COZAAR) 25 MG tablet    Sig: Take 1 tablet (25 mg total) by mouth every evening.    Dispense:  90 tablet    Refill:  3    Medications Discontinued During This Encounter  Medication Reason   albuterol (PROVENTIL HFA;VENTOLIN HFA) 108 (90 Base) MCG/ACT inhaler    clopidogrel (PLAVIX) 75 MG tablet    cyclobenzaprine (FLEXERIL) 10 MG tablet    labetalol (NORMODYNE) 200 MG tablet    rosuvastatin (CRESTOR) 5 MG tablet Reorder     Recommendations:   Scott Chavez is a 67 y.o. male with history of renal transplant in 2010, PAD with bilateral iliac artery stenting and bilateral femoropopliteal bypass surgery in the past, HTN, and HLD who is here for a follow-up visit.  Due to recurrent claudication symptoms, he underwent repeat peripheral arteriogram by vascular surgery on 10/07/2022 and underwent bilateral iliac artery angioplasty for in-stent restenosis.  Vascular disease Belling followed by surgeons.  ***  PAD (peripheral artery disease) (HCC) Atherosclerosis of native artery of both lower extremities with  intermittent claudication (HCC) Patient follows with vascular surgery regarding his PAD He has had multiple interventions but denies claudication at this time   Bilateral carotid artery stenosis Repeat carotid duplex ordered   Essential hypertension Continue current cardiac medications with the following changes: increase nifedipine to 90mg . Encourage low-sodium diet, less than 2000 mg daily. Enrolled in RPM monitoring program Follow-up in 3-6 months or sooner if needed.   Nonspecific abnormal electrocardiogram (ECG) (EKG) Echocardiogram ordered   Scott Decamp, MD, [facility] 11/26/2022, 3:36 PM Office: [phone] Fax: [phone] Pager: [phone]

## 2022-12-10 DIAGNOSIS — E7800 Pure hypercholesterolemia, unspecified: Secondary | ICD-10-CM | POA: Diagnosis not present

## 2022-12-10 DIAGNOSIS — Z940 Kidney transplant status: Secondary | ICD-10-CM | POA: Diagnosis not present

## 2022-12-15 DIAGNOSIS — N319 Neuromuscular dysfunction of bladder, unspecified: Secondary | ICD-10-CM | POA: Diagnosis not present

## 2022-12-15 DIAGNOSIS — I129 Hypertensive chronic kidney disease with stage 1 through stage 4 chronic kidney disease, or unspecified chronic kidney disease: Secondary | ICD-10-CM | POA: Diagnosis not present

## 2022-12-15 DIAGNOSIS — I739 Peripheral vascular disease, unspecified: Secondary | ICD-10-CM | POA: Diagnosis not present

## 2022-12-15 DIAGNOSIS — F988 Other specified behavioral and emotional disorders with onset usually occurring in childhood and adolescence: Secondary | ICD-10-CM | POA: Diagnosis not present

## 2022-12-15 DIAGNOSIS — E7800 Pure hypercholesterolemia, unspecified: Secondary | ICD-10-CM | POA: Diagnosis not present

## 2022-12-15 DIAGNOSIS — R399 Unspecified symptoms and signs involving the genitourinary system: Secondary | ICD-10-CM | POA: Diagnosis not present

## 2022-12-15 DIAGNOSIS — D649 Anemia, unspecified: Secondary | ICD-10-CM | POA: Diagnosis not present

## 2022-12-15 DIAGNOSIS — Z8619 Personal history of other infectious and parasitic diseases: Secondary | ICD-10-CM | POA: Diagnosis not present

## 2022-12-15 DIAGNOSIS — Z940 Kidney transplant status: Secondary | ICD-10-CM | POA: Diagnosis not present

## 2022-12-22 ENCOUNTER — Other Ambulatory Visit: Payer: No Typology Code available for payment source

## 2023-01-05 ENCOUNTER — Other Ambulatory Visit: Payer: No Typology Code available for payment source

## 2023-01-27 ENCOUNTER — Ambulatory Visit: Payer: No Typology Code available for payment source | Admitting: Cardiology

## 2023-02-08 DIAGNOSIS — F17211 Nicotine dependence, cigarettes, in remission: Secondary | ICD-10-CM | POA: Diagnosis not present

## 2023-02-08 DIAGNOSIS — D84821 Immunodeficiency due to drugs: Secondary | ICD-10-CM | POA: Diagnosis not present

## 2023-02-08 DIAGNOSIS — Z681 Body mass index (BMI) 19 or less, adult: Secondary | ICD-10-CM | POA: Diagnosis not present

## 2023-02-08 DIAGNOSIS — R2681 Unsteadiness on feet: Secondary | ICD-10-CM | POA: Diagnosis not present

## 2023-02-08 DIAGNOSIS — Z008 Encounter for other general examination: Secondary | ICD-10-CM | POA: Diagnosis not present

## 2023-02-22 ENCOUNTER — Ambulatory Visit (HOSPITAL_COMMUNITY): Payer: No Typology Code available for payment source

## 2023-02-22 ENCOUNTER — Ambulatory Visit (HOSPITAL_COMMUNITY): Payer: No Typology Code available for payment source | Attending: Vascular Surgery

## 2023-02-22 ENCOUNTER — Ambulatory Visit: Payer: No Typology Code available for payment source

## 2023-03-22 DIAGNOSIS — R399 Unspecified symptoms and signs involving the genitourinary system: Secondary | ICD-10-CM | POA: Diagnosis not present

## 2023-04-10 ENCOUNTER — Emergency Department (HOSPITAL_COMMUNITY)
Admission: EM | Admit: 2023-04-10 | Discharge: 2023-04-11 | Disposition: A | Discharge status: Home / Self Care | Payer: No Typology Code available for payment source | Attending: Emergency Medicine | Admitting: Emergency Medicine

## 2023-04-10 ENCOUNTER — Encounter (HOSPITAL_COMMUNITY): Payer: Self-pay

## 2023-04-10 ENCOUNTER — Other Ambulatory Visit: Payer: Self-pay

## 2023-04-10 DIAGNOSIS — Z79899 Other long term (current) drug therapy: Secondary | ICD-10-CM | POA: Diagnosis not present

## 2023-04-10 DIAGNOSIS — N186 End stage renal disease: Secondary | ICD-10-CM | POA: Diagnosis not present

## 2023-04-10 DIAGNOSIS — Z7902 Long term (current) use of antithrombotics/antiplatelets: Secondary | ICD-10-CM | POA: Diagnosis not present

## 2023-04-10 DIAGNOSIS — I120 Hypertensive chronic kidney disease with stage 5 chronic kidney disease or end stage renal disease: Secondary | ICD-10-CM | POA: Diagnosis not present

## 2023-04-10 DIAGNOSIS — B029 Zoster without complications: Secondary | ICD-10-CM | POA: Insufficient documentation

## 2023-04-10 DIAGNOSIS — R21 Rash and other nonspecific skin eruption: Secondary | ICD-10-CM | POA: Diagnosis present

## 2023-04-10 DIAGNOSIS — I10 Essential (primary) hypertension: Secondary | ICD-10-CM | POA: Diagnosis not present

## 2023-04-10 MED ORDER — ACETAMINOPHEN 325 MG PO TABS
650.0000 mg | ORAL_TABLET | Freq: Once | ORAL | Status: AC
  Administered 2023-04-10: 650 mg via ORAL
  Filled 2023-04-10: qty 2

## 2023-04-10 MED ORDER — OXYCODONE-ACETAMINOPHEN 5-325 MG PO TABS
1.0000 | ORAL_TABLET | Freq: Once | ORAL | Status: AC
  Administered 2023-04-10: 1 via ORAL
  Filled 2023-04-10: qty 1

## 2023-04-10 MED ORDER — ONDANSETRON 4 MG PO TBDP
4.0000 mg | ORAL_TABLET | Freq: Once | ORAL | Status: AC
  Administered 2023-04-10: 4 mg via ORAL
  Filled 2023-04-10: qty 1

## 2023-04-10 MED ORDER — HYDRALAZINE HCL 25 MG PO TABS
25.0000 mg | ORAL_TABLET | Freq: Once | ORAL | Status: AC
  Administered 2023-04-10: 25 mg via ORAL
  Filled 2023-04-10: qty 1

## 2023-04-10 MED ORDER — NIFEDIPINE ER OSMOTIC RELEASE 60 MG PO TB24
90.0000 mg | ORAL_TABLET | Freq: Once | ORAL | Status: AC
  Administered 2023-04-10: 90 mg via ORAL
  Filled 2023-04-10: qty 1

## 2023-04-10 NOTE — ED Provider Notes (Signed)
[place] EMERGENCY DEPARTMENT AT [facility] Provider Note   CSN: [id number] Arrival date & time: 04/10/23  1836     History {Add pertinent medical, surgical, social history, OB history to HPI:1} Chief Complaint  Patient presents with   Rash   Blister    Scott Chavez is a 67 y.o. male patient with past medical history of end-stage renal disease on dialysis, GERD, peripheral vascular disease, anemia presenting to emergency room with 2 days of rash on the left side of his face.  The rash started yesterday and is very painful.  Describes rash as blisters.  Blisters are vesicular and spreading over his lip and has some involvement to the mouth.  Patient has not tried anything for the pain.  Denies any fever, chills or associated symptoms.   Rash      Home Medications Prior to Admission medications   Medication Sig Start Date End Date Taking? Authorizing Provider  clopidogrel (PLAVIX) 75 MG tablet Take 75 mg by mouth daily.    [provider]  Clotrimazole 1 % OINT Apply 1 application  topically 2 (two) times daily. Apply to affected and surrounding area(s) twice daily until 1 week after clinical resolution, typically for 4 weeks total. 08/19/22   Valentino Nose, NP  Cyanocobalamin (VITAMIN B-12 PO) Take 1 tablet by mouth daily. Patient not taking: Reported on 11/26/2022    [provider]  ferrous sulfate 325 (65 FE) MG EC tablet Take 1 tablet (325 mg total) by mouth 2 (two) times daily. 02/07/19   Rhetta Mura, MD  losartan (COZAAR) 25 MG tablet Take 1 tablet (25 mg total) by mouth every evening. 11/26/22 02/24/23  Yates Decamp, MD  Multiple Vitamin (MULTIVITAMIN WITH MINERALS) TABS tablet Take 1 tablet by mouth daily.    [provider]  mycophenolate (CELLCEPT) 250 MG capsule Take 250 mg by mouth 2 (two) times daily.    [provider]  Nebivolol HCl (BYSTOLIC) 20 MG TABS Take 1 tablet (20 mg total) by mouth every morning.  11/26/22   Yates Decamp, MD  NIFEdipine (ADALAT CC) 90 MG 24 hr tablet Take 1 tablet (90 mg total) by mouth daily. 05/28/22   Custovic, Rozell Searing, DO  rosuvastatin (CRESTOR) 10 MG tablet Take 1 tablet (10 mg total) by mouth daily. 11/26/22   Yates Decamp, MD  tacrolimus (PROGRAF) 1 MG capsule Take 3-4 mg by mouth See admin instructions. Take 4 mg by mouth in the morning and 3 mg in the evening    [provider]  tadalafil (CIALIS) 20 MG tablet Take 20 mg by mouth daily as needed for erectile dysfunction.    [provider]  tamsulosin (FLOMAX) 0.4 MG CAPS capsule Take 0.4 mg by mouth daily.    [provider]  zolpidem (AMBIEN) 10 MG tablet Take 10 mg by mouth at bedtime as needed for sleep.    [provider]      Allergies    Tape    Review of Systems   Review of Systems  Skin:  Positive for rash.    Physical Exam Updated Vital Signs BP (!) 215/96 Comment: informed triage tech about b/p  Pulse 79   Temp 98.6 F (37 C) (Oral)   Resp (!) 21   Ht 5\' 9"  (1.753 m)   Wt 61.2 kg   SpO2 97%   BMI 19.94 kg/m  Physical Exam Vitals and nursing note reviewed.  Constitutional:      General: He  is not in acute distress.    Appearance: He is not toxic-appearing.  HENT:     Head: Normocephalic and atraumatic.  Eyes:     General: No scleral icterus.    Conjunctiva/sclera: Conjunctivae normal.  Cardiovascular:     Rate and Rhythm: Normal rate and regular rhythm.     Pulses: Normal pulses.     Heart sounds: Normal heart sounds.  Pulmonary:     Effort: Pulmonary effort is normal. No respiratory distress.     Breath sounds: Normal breath sounds.  Abdominal:     General: Abdomen is flat. Bowel sounds are normal.     Palpations: Abdomen is soft.     Tenderness: There is no abdominal tenderness.  Skin:    General: Skin is warm and dry.     Findings: No lesion.  Neurological:     General: No focal deficit present.     Mental Status: He is alert and oriented  to person, place, and time. Mental status is at baseline.     ED Results / Procedures / Treatments   Labs (all labs ordered are listed, but only abnormal results are displayed) Labs Reviewed  HSV CULTURE AND TYPING    EKG None  Radiology No results found.  Procedures Procedures  {Document cardiac monitor, telemetry assessment procedure when appropriate:1}  Medications Ordered in ED Medications - No data to display  ED Course/ Medical Decision Making/ A&P   {   Click here for ABCD2, HEART and other calculatorsREFRESH Note before signing :1}                              Medical Decision Making Amount and/or Complexity of Data Reviewed Labs: ordered.  Risk Prescription drug management.   This patient presents to the ED for concern of rash, this involves an extensive number of treatment options, and is a complaint that carries with it a high risk of complications and morbidity.  The differential diagnosis includes shingles   Co morbidities that complicate the patient evaluation  ***   Additional history obtained:  Additional history obtained from ***   Lab Tests:  I personally interpreted labs.  The pertinent results include:  ***   Imaging Studies ordered:  I ordered imaging studies including ***  I independently visualized and interpreted imaging which showed *** I agree with the radiologist interpretation   Cardiac Monitoring: / EKG:  The patient was maintained on a cardiac monitor.  I personally viewed and interpreted the cardiac monitored which showed an underlying rhythm of: ***   Consultations Obtained:  I requested consultation with the ***,  and discussed lab and imaging findings as well as pertinent plan - they recommend: ***   Problem List / ED Course / Critical interventions / Medication management  *** I ordered medication including ***  for ***  Reevaluation of the patient after these medicines showed that the patient  {resolved/improved/worsened:23923::"improved"} I have reviewed the patients home medicines and have made adjustments as needed   Plan  F/u w/ PCP in 2-3d to ensure resolution of sx.  Patient was given return precautions. Patient stable for discharge at this time.  Patient educated on sx/dx and verbalized understanding of plan. Return to ER w/ new or worsening sx.    {Document critical care time when appropriate:1} {Document review of labs and clinical decision tools ie heart score, Chads2Vasc2 etc:1}  {Document your independent review of radiology images, and any  outside records:1} {Document your discussion with family members, caretakers, and with consultants:1} {Document social determinants of health affecting pt's care:1} {Document your decision making why or why not admission, treatments were needed:1} Final Clinical Impression(s) / ED Diagnoses Final diagnoses:  None    Rx / DC Orders ED Discharge Orders     None

## 2023-04-10 NOTE — ED Triage Notes (Signed)
Pt arrived via POV. C/o painful rash on face. Rash is blistered and covers lips, R side of face, and near eyes.

## 2023-04-11 LAB — CBC WITH DIFFERENTIAL/PLATELET
Abs Immature Granulocytes: 0.02 10*3/uL (ref 0.00–0.07)
Basophils Absolute: 0.1 10*3/uL (ref 0.0–0.1)
Basophils Relative: 1 %
Eosinophils Absolute: 0.1 10*3/uL (ref 0.0–0.5)
Eosinophils Relative: 2 %
HCT: 43.3 % (ref 39.0–52.0)
Hemoglobin: 13.6 g/dL (ref 13.0–17.0)
Immature Granulocytes: 0 %
Lymphocytes Relative: 12 %
Lymphs Abs: 0.8 10*3/uL (ref 0.7–4.0)
MCH: 27.5 pg (ref 26.0–34.0)
MCHC: 31.4 g/dL (ref 30.0–36.0)
MCV: 87.7 fL (ref 80.0–100.0)
Monocytes Absolute: 0.7 10*3/uL (ref 0.1–1.0)
Monocytes Relative: 10 %
Neutro Abs: 5.2 10*3/uL (ref 1.7–7.7)
Neutrophils Relative %: 75 %
Platelets: 264 10*3/uL (ref 150–400)
RBC: 4.94 MIL/uL (ref 4.22–5.81)
RDW: 14.9 % (ref 11.5–15.5)
WBC: 6.9 10*3/uL (ref 4.0–10.5)
nRBC: 0 % (ref 0.0–0.2)

## 2023-04-11 LAB — BASIC METABOLIC PANEL
Anion gap: 10 (ref 5–15)
BUN: 18 mg/dL (ref 8–23)
CO2: 23 mmol/L (ref 22–32)
Calcium: 10 mg/dL (ref 8.9–10.3)
Chloride: 103 mmol/L (ref 98–111)
Creatinine, Ser: 1.48 mg/dL — ABNORMAL HIGH (ref 0.61–1.24)
GFR, Estimated: 52 mL/min — ABNORMAL LOW (ref >60)
Glucose, Bld: 103 mg/dL — ABNORMAL HIGH (ref 70–99)
Potassium: 3.9 mmol/L (ref 3.5–5.1)
Sodium: 136 mmol/L (ref 135–145)

## 2023-04-11 LAB — I-STAT CHEM 8, ED
BUN: 17 mg/dL (ref 8–23)
Calcium, Ion: 1.33 mmol/L (ref 1.15–1.40)
Chloride: 105 mmol/L (ref 98–111)
Creatinine, Ser: 1.5 mg/dL — ABNORMAL HIGH (ref 0.61–1.24)
Glucose, Bld: 101 mg/dL — ABNORMAL HIGH (ref 70–99)
HCT: 47 % (ref 39.0–52.0)
Hemoglobin: 16 g/dL (ref 13.0–17.0)
Potassium: 3.9 mmol/L (ref 3.5–5.1)
Sodium: 140 mmol/L (ref 135–145)
TCO2: 23 mmol/L (ref 22–32)

## 2023-04-11 MED ORDER — MORPHINE SULFATE (PF) 4 MG/ML IV SOLN
4.0000 mg | Freq: Once | INTRAVENOUS | Status: AC
  Administered 2023-04-11: 4 mg via INTRAVENOUS
  Filled 2023-04-11: qty 1

## 2023-04-11 MED ORDER — ONDANSETRON HCL 4 MG/2ML IJ SOLN
4.0000 mg | Freq: Once | INTRAMUSCULAR | Status: AC
  Administered 2023-04-11: 4 mg via INTRAVENOUS
  Filled 2023-04-11: qty 2

## 2023-04-11 MED ORDER — HYDROCODONE-ACETAMINOPHEN 5-325 MG PO TABS: 1.0000 | ORAL_TABLET | Freq: Four times a day (QID) | ORAL | 0 refills | Status: AC | PRN

## 2023-04-11 MED ORDER — LABETALOL HCL 5 MG/ML IV SOLN
20.0000 mg | Freq: Once | INTRAVENOUS | Status: AC
  Administered 2023-04-11: 20 mg via INTRAVENOUS
  Filled 2023-04-11: qty 4

## 2023-04-11 MED ORDER — VALACYCLOVIR HCL 1 G PO TABS: 1000.0000 mg | ORAL_TABLET | Freq: Two times a day (BID) | ORAL | 0 refills | Status: AC

## 2023-04-11 NOTE — Discharge Instructions (Addendum)
You were seen in the emergency room today for rash.  The rash is consistent with a shingles.  I have sent Norco to your pharmacy please take as prescribed.  You can also take Tylenol 650 mg 4 times a day.  If itching occurs you can use Benadryl.  Please return to emergency room with new or worsening symptoms.  Your Creatinine was 1.5.  Please follow-up with your doctor and have this recheck in 1 week.

## 2023-04-11 NOTE — ED Provider Notes (Signed)
Patient BP is improving.  Rx's sent to pharmacy.  Discussed Cr with patient.  Will have patient follow-up with his PCP for recheck.  Vitals:   04/11/23 0038 04/11/23 0040  BP: (!) 195/69 (!) 195/69  Pulse: (!) 56 62  Resp: 18 14  Temp: 97.7 F (36.5 C)   SpO2: 100% 100%      Roxy Horseman, PA-C 04/11/23 0103    Palumbo, April, MD 04/11/23 0207

## 2023-04-13 LAB — HSV CULTURE AND TYPING

## 2023-04-14 DIAGNOSIS — Z940 Kidney transplant status: Secondary | ICD-10-CM | POA: Diagnosis not present

## 2023-04-14 DIAGNOSIS — I129 Hypertensive chronic kidney disease with stage 1 through stage 4 chronic kidney disease, or unspecified chronic kidney disease: Secondary | ICD-10-CM | POA: Diagnosis not present

## 2023-04-20 DIAGNOSIS — N319 Neuromuscular dysfunction of bladder, unspecified: Secondary | ICD-10-CM | POA: Diagnosis not present

## 2023-04-20 DIAGNOSIS — Z8619 Personal history of other infectious and parasitic diseases: Secondary | ICD-10-CM | POA: Diagnosis not present

## 2023-04-20 DIAGNOSIS — Z940 Kidney transplant status: Secondary | ICD-10-CM | POA: Diagnosis not present

## 2023-04-20 DIAGNOSIS — D649 Anemia, unspecified: Secondary | ICD-10-CM | POA: Diagnosis not present

## 2023-04-20 DIAGNOSIS — I739 Peripheral vascular disease, unspecified: Secondary | ICD-10-CM | POA: Diagnosis not present

## 2023-04-20 DIAGNOSIS — E7800 Pure hypercholesterolemia, unspecified: Secondary | ICD-10-CM | POA: Diagnosis not present

## 2023-04-20 DIAGNOSIS — R399 Unspecified symptoms and signs involving the genitourinary system: Secondary | ICD-10-CM | POA: Diagnosis not present

## 2023-04-20 DIAGNOSIS — I129 Hypertensive chronic kidney disease with stage 1 through stage 4 chronic kidney disease, or unspecified chronic kidney disease: Secondary | ICD-10-CM | POA: Diagnosis not present

## 2023-04-20 DIAGNOSIS — F988 Other specified behavioral and emotional disorders with onset usually occurring in childhood and adolescence: Secondary | ICD-10-CM | POA: Diagnosis not present

## 2023-04-29 ENCOUNTER — Other Ambulatory Visit: Payer: Self-pay | Admitting: Internal Medicine

## 2023-05-13 ENCOUNTER — Encounter (HOSPITAL_COMMUNITY): Payer: Self-pay

## 2023-05-13 ENCOUNTER — Other Ambulatory Visit: Payer: Self-pay

## 2023-05-13 ENCOUNTER — Emergency Department (HOSPITAL_COMMUNITY)
Admission: EM | Admit: 2023-05-13 | Discharge: 2023-05-13 | Disposition: A | Discharge status: Home / Self Care | Payer: 59 | Attending: Emergency Medicine | Admitting: Emergency Medicine

## 2023-05-13 DIAGNOSIS — I120 Hypertensive chronic kidney disease with stage 5 chronic kidney disease or end stage renal disease: Secondary | ICD-10-CM | POA: Insufficient documentation

## 2023-05-13 DIAGNOSIS — R21 Rash and other nonspecific skin eruption: Secondary | ICD-10-CM | POA: Diagnosis present

## 2023-05-13 DIAGNOSIS — N186 End stage renal disease: Secondary | ICD-10-CM | POA: Insufficient documentation

## 2023-05-13 DIAGNOSIS — B029 Zoster without complications: Secondary | ICD-10-CM | POA: Diagnosis not present

## 2023-05-13 MED ORDER — HYDROXYZINE HCL 25 MG PO TABS: 25.0000 mg | ORAL_TABLET | Freq: Four times a day (QID) | ORAL | 0 refills | Status: DC | PRN

## 2023-05-13 NOTE — Discharge Instructions (Signed)
 Please continue over the counter medication as needed for itching and dry eyes. Please follow with the eye doctor listed. You will need to call the office on Monday.

## 2023-05-13 NOTE — ED Triage Notes (Signed)
 Pt reports recently diagnosed and treated for shingles that is on the left side of his face. Pt reports ongoing pain, itching, and swelling around left eye. Reports some intermittent blurred vision. PT AxOx4.

## 2023-05-13 NOTE — ED Provider Notes (Signed)
 Emergency Department Provider Note   I have reviewed the triage vital signs and the nursing notes.   HISTORY  Chief Complaint Herpes Zoster and Eye Problem   HPI Scott Chavez is a 68 y.o. male with past history reviewed Presents to the emergency department with improving rash to the left face, previously described as shingles.  He has completed his antiviral treatment and notes that the wound is improving but he is feeling some itching to his face along with intermittent blurry vision.  No eye pain, redness, drainage.  No severe headache or confusion.  No fevers.  He was hoping that most of the rash and symptoms would be gone after completing the medication.  Past Medical History:  Diagnosis Date   ADHD    Anemia    pt. not sure, but reports he is taking Fe for one month now.   Aorto-iliac disease (HCC)    Bladder dysfunction    ESRD (end stage renal disease) (HCC)    [facility] , followed by Dr. Prescilla    GERD (gastroesophageal reflux disease)    Hemodialysis patient [facility])    on dialysis 1998-2010 04/25/2013)   Hepatitis C    has been treated with Harvoni    History of renal failure    Hypercholesteremia    Hypertension    Internal hemorrhoids    Colonoscopy 2010 and anoscopy 2015   Peripheral vascular disease (HCC)    Pneumonia    Self-catheterizes urinary bladder    pt. choice, but he reports that he desires to do this to avoid retention     Review of Systems  Constitutional: No fever/chills Cardiovascular: Denies chest pain. Respiratory: Denies shortness of breath. Gastrointestinal: No abdominal pain.  Musculoskeletal: Negative for back pain. Skin: Positive for rash to face.  Neurological: Negative for headaches, focal weakness or numbness.   ____________________________________________   PHYSICAL EXAM:  VITAL SIGNS: ED Triage Vitals  Encounter Vitals Group     BP 05/13/23 1555 (!) 189/96     Pulse Rate 05/13/23  1555 73     Resp 05/13/23 1555 17     Temp 05/13/23 1555 98.8 F (37.1 C)     Temp Source 05/13/23 1555 Oral     SpO2 05/13/23 1555 99 %     Weight 05/13/23 1555 137 lb (62.1 kg)     Height 05/13/23 1555 5' 9 (1.753 m)   Constitutional: Alert and oriented. Well appearing and in no acute distress. Eyes: Conjunctivae are normal. PERRL. EOMI. No periorbital edema.  Head: Atraumatic. Nose: No congestion/rhinnorhea. Mouth/Throat: Mucous membranes are moist.   Neck: No stridor.   Cardiovascular: Normal rate, regular rhythm. Good peripheral circulation. Grossly normal heart sounds.   Respiratory: Normal respiratory effort.  No retractions. Lungs CTAB. Gastrointestinal: No distention.  Musculoskeletal: No gross deformities of extremities. Neurologic:  Normal speech and language.  Skin:  Skin is warm, dry and intact. No rash noted.  ____________________________________________   PROCEDURES  Procedure(s) performed:   Procedures  None  ____________________________________________   INITIAL IMPRESSION / ASSESSMENT AND PLAN / ED COURSE  Pertinent labs & imaging results that were available during my care of the patient were reviewed by me and considered in my medical decision making (see chart for details).   This patient is Presenting for Evaluation of face rash, which does require a range of treatment options, and is a complaint that involves a moderate risk of morbidity and mortality.  The Differential Diagnoses include shingles, cellulitis,  periorbital cellulitis, conjunctivitis, etc.  I decided to review pertinent External Data, and in summary patient seen in 04/2023 for shingles to face. Rash looks much better in comparison to pictures from that time.   Medical Decision Making: Summary:  Patient presents emergency department with continued facial rash.  On evaluation today, the rash appears to be healing very nicely.  He does not have erythema, abscess, drainage/crusting.   There are no new vesicular lesions to the face.  No conjunctival injection, current vision change, periorbital swelling to suspect secondary bacterial infection.  Encouraged him to continue supportive care at home and gave contact information for ophthalmology for outpatient follow up.   Patient's presentation is most consistent with acute, uncomplicated illness.   Disposition: discharge  ____________________________________________  FINAL CLINICAL IMPRESSION(S) / ED DIAGNOSES  Final diagnoses:  Herpes zoster without complication    Note:  This document was prepared using Dragon voice recognition software and may include unintentional dictation errors.  Fonda Law, MD, [facility] Emergency Medicine    Jeanifer Halliday, Fonda MATSU, MD 05/13/23 2129

## 2023-05-18 DIAGNOSIS — H11432 Conjunctival hyperemia, left eye: Secondary | ICD-10-CM | POA: Diagnosis not present

## 2023-05-18 DIAGNOSIS — B0239 Other herpes zoster eye disease: Secondary | ICD-10-CM | POA: Diagnosis not present

## 2023-05-18 DIAGNOSIS — H02849 Edema of unspecified eye, unspecified eyelid: Secondary | ICD-10-CM | POA: Diagnosis not present

## 2023-05-18 DIAGNOSIS — H5712 Ocular pain, left eye: Secondary | ICD-10-CM | POA: Diagnosis not present

## 2023-05-22 ENCOUNTER — Other Ambulatory Visit: Payer: Self-pay | Admitting: Cardiology

## 2023-05-22 DIAGNOSIS — I10 Essential (primary) hypertension: Secondary | ICD-10-CM

## 2023-06-15 DIAGNOSIS — H2513 Age-related nuclear cataract, bilateral: Secondary | ICD-10-CM | POA: Diagnosis not present

## 2023-06-15 DIAGNOSIS — H40023 Open angle with borderline findings, high risk, bilateral: Secondary | ICD-10-CM | POA: Diagnosis not present

## 2023-07-27 DIAGNOSIS — H2513 Age-related nuclear cataract, bilateral: Secondary | ICD-10-CM | POA: Diagnosis not present

## 2023-07-27 DIAGNOSIS — H40023 Open angle with borderline findings, high risk, bilateral: Secondary | ICD-10-CM | POA: Diagnosis not present

## 2023-08-09 DIAGNOSIS — I129 Hypertensive chronic kidney disease with stage 1 through stage 4 chronic kidney disease, or unspecified chronic kidney disease: Secondary | ICD-10-CM | POA: Diagnosis not present

## 2023-08-09 DIAGNOSIS — Z940 Kidney transplant status: Secondary | ICD-10-CM | POA: Diagnosis not present

## 2023-08-18 DIAGNOSIS — I129 Hypertensive chronic kidney disease with stage 1 through stage 4 chronic kidney disease, or unspecified chronic kidney disease: Secondary | ICD-10-CM | POA: Diagnosis not present

## 2023-08-18 DIAGNOSIS — N319 Neuromuscular dysfunction of bladder, unspecified: Secondary | ICD-10-CM | POA: Diagnosis not present

## 2023-08-18 DIAGNOSIS — I739 Peripheral vascular disease, unspecified: Secondary | ICD-10-CM | POA: Diagnosis not present

## 2023-08-18 DIAGNOSIS — D649 Anemia, unspecified: Secondary | ICD-10-CM | POA: Diagnosis not present

## 2023-08-18 DIAGNOSIS — Z8619 Personal history of other infectious and parasitic diseases: Secondary | ICD-10-CM | POA: Diagnosis not present

## 2023-08-18 DIAGNOSIS — Z940 Kidney transplant status: Secondary | ICD-10-CM | POA: Diagnosis not present

## 2023-08-18 DIAGNOSIS — E7800 Pure hypercholesterolemia, unspecified: Secondary | ICD-10-CM | POA: Diagnosis not present

## 2023-08-25 ENCOUNTER — Other Ambulatory Visit: Payer: Self-pay | Admitting: Vascular Surgery

## 2023-10-07 ENCOUNTER — Other Ambulatory Visit: Payer: Self-pay | Admitting: Cardiology

## 2023-10-07 DIAGNOSIS — I10 Essential (primary) hypertension: Secondary | ICD-10-CM

## 2023-11-23 ENCOUNTER — Other Ambulatory Visit: Payer: Self-pay | Admitting: Cardiology

## 2023-11-23 DIAGNOSIS — E7800 Pure hypercholesterolemia, unspecified: Secondary | ICD-10-CM

## 2023-11-24 DIAGNOSIS — H2513 Age-related nuclear cataract, bilateral: Secondary | ICD-10-CM | POA: Diagnosis not present

## 2023-11-24 DIAGNOSIS — H40023 Open angle with borderline findings, high risk, bilateral: Secondary | ICD-10-CM | POA: Diagnosis not present

## 2023-11-25 DIAGNOSIS — Z940 Kidney transplant status: Secondary | ICD-10-CM | POA: Diagnosis not present

## 2023-11-25 DIAGNOSIS — I739 Peripheral vascular disease, unspecified: Secondary | ICD-10-CM | POA: Diagnosis not present

## 2023-11-25 DIAGNOSIS — I129 Hypertensive chronic kidney disease with stage 1 through stage 4 chronic kidney disease, or unspecified chronic kidney disease: Secondary | ICD-10-CM | POA: Diagnosis not present

## 2023-11-25 DIAGNOSIS — N319 Neuromuscular dysfunction of bladder, unspecified: Secondary | ICD-10-CM | POA: Diagnosis not present

## 2023-11-25 DIAGNOSIS — Z8619 Personal history of other infectious and parasitic diseases: Secondary | ICD-10-CM | POA: Diagnosis not present

## 2023-11-25 DIAGNOSIS — E7800 Pure hypercholesterolemia, unspecified: Secondary | ICD-10-CM | POA: Diagnosis not present

## 2023-11-25 DIAGNOSIS — D649 Anemia, unspecified: Secondary | ICD-10-CM | POA: Diagnosis not present

## 2023-12-07 DIAGNOSIS — I10 Essential (primary) hypertension: Secondary | ICD-10-CM | POA: Diagnosis not present

## 2023-12-07 DIAGNOSIS — D509 Iron deficiency anemia, unspecified: Secondary | ICD-10-CM | POA: Diagnosis not present

## 2023-12-07 DIAGNOSIS — J301 Allergic rhinitis due to pollen: Secondary | ICD-10-CM | POA: Diagnosis not present

## 2023-12-07 DIAGNOSIS — K219 Gastro-esophageal reflux disease without esophagitis: Secondary | ICD-10-CM | POA: Diagnosis not present

## 2023-12-07 DIAGNOSIS — Z0001 Encounter for general adult medical examination with abnormal findings: Secondary | ICD-10-CM | POA: Diagnosis not present

## 2023-12-07 DIAGNOSIS — Z87891 Personal history of nicotine dependence: Secondary | ICD-10-CM | POA: Diagnosis not present

## 2023-12-07 DIAGNOSIS — E7800 Pure hypercholesterolemia, unspecified: Secondary | ICD-10-CM | POA: Diagnosis not present

## 2023-12-07 DIAGNOSIS — Z131 Encounter for screening for diabetes mellitus: Secondary | ICD-10-CM | POA: Diagnosis not present

## 2023-12-14 DIAGNOSIS — I129 Hypertensive chronic kidney disease with stage 1 through stage 4 chronic kidney disease, or unspecified chronic kidney disease: Secondary | ICD-10-CM | POA: Diagnosis not present

## 2023-12-14 DIAGNOSIS — Z940 Kidney transplant status: Secondary | ICD-10-CM | POA: Diagnosis not present

## 2023-12-24 ENCOUNTER — Other Ambulatory Visit: Payer: Self-pay | Admitting: Cardiology

## 2023-12-24 DIAGNOSIS — E7800 Pure hypercholesterolemia, unspecified: Secondary | ICD-10-CM

## 2024-01-18 ENCOUNTER — Other Ambulatory Visit: Payer: Self-pay | Admitting: Cardiology

## 2024-01-18 DIAGNOSIS — I10 Essential (primary) hypertension: Secondary | ICD-10-CM

## 2024-01-25 ENCOUNTER — Other Ambulatory Visit: Payer: Self-pay | Admitting: Cardiology

## 2024-01-25 DIAGNOSIS — E7800 Pure hypercholesterolemia, unspecified: Secondary | ICD-10-CM

## 2024-02-03 ENCOUNTER — Ambulatory Visit: Attending: Cardiology | Admitting: Cardiology

## 2024-02-03 NOTE — Progress Notes (Deleted)
 Cardiology Office Note:  .   Date:  02/03/2024  ID:  Scott Chavez, DOB 1955-09-23, MRN [id number] PCP: Scott Bare, MD  [facility] Providers Cardiologist:  None { Click to update primary MD,subspecialty MD or APP then REFRESH:1}  History of Present Illness: Scott Chavez   Scott Chavez is a 68 y.o.  male with history of renal transplant in 2010, PAD with bilateral iliac artery stenting and bilateral femoropopliteal bypass surgery left leg in 2014 and right leg in 2016, repeat drug-coated balloon angioplasty of bilateral iliac arteries on 10/07/2022 by vascular surgery for in-stent restenosis, HTN, and HLD.  He has had a negative and a normal nuclear stress test in 2019 and echocardiogram on 06/04/2022 revealing severe LVH hyperdynamic LVEF, LVH unchanged from 2021.    Cardiac Studies relevent.    Carotid artery duplex 06/04/2022: Duplex suggests stenosis in the right internal carotid artery (1-15%). No significant stenosis in the right external carotid artery. Duplex suggests stenosis in the left internal carotid artery (50-69%). No significant stenosis in the left external carotid artery.  LE Bypass:  Left fem-pop bypass in 2014 and right fem-pop bypass in 2016 and multiple revascularizations and history of bilateral iliac artery stents.  Bilateral iliac angioplasty with stents on 10/07/2022 for ISR.   Echocardiogram 06/04/2022: Hyperdynamic LV systolic function with visual EF >70%. Left ventricle cavity is normal in size. Normal global wall motion. Normal diastolic filling pattern, normal LAP. Severe left ventricular hypertrophy, peak LVOT gradient 3.84mmHG. Systolic anterior motion of the mitral valve. Mild (Grade I) aortic regurgitation. Mild tricuspid regurgitation. No evidence of pulmonary hypertension. Consider further evaluation for hypertrophic cardiomyopathy or infiltrative cardiomyopathy - clinical correlation required.  No significant change from 05/29/2019 with regard to  LVH.    Discussed the use of AI scribe software for clinical note transcription with the patient, who gave verbal consent to proceed.  History of Present Illness    Labs   Lab Results  Component Value Date   CHOL 139 12/10/2022   HDL 61 12/10/2022   LDLCALC 60 12/10/2022   TRIG 99 12/10/2022   CHOLHDL 2.2 02/16/2016   No results found for: LIPOA  Recent Labs    04/11/23 0007 04/11/23 0033  NA 136 140  K 3.9 3.9  CL 103 105  CO2 23  --   GLUCOSE 103* 101*  BUN 18 17  CREATININE 1.48* 1.50*  CALCIUM  10.0  --   GFRNONAA 52*  --     Lab Results  Component Value Date   ALT 12 05/30/2021   AST 21 05/30/2021   ALKPHOS 61 05/30/2021   BILITOT 0.8 05/30/2021      Latest Ref Rng & Units 04/11/2023   12:33 AM 04/11/2023   12:07 AM 10/07/2022   10:01 AM  CBC  WBC 4.0 - 10.5 K/uL  6.9    Hemoglobin 13.0 - 17.0 g/dL 83.9  86.3  86.0   Hematocrit 39.0 - 52.0 % 47.0  43.3  41.0   Platelets 150 - 400 K/uL  264     No results found for: HGBA1C  No results found for: TSH  Care everywhere/Faxed External Labs:  ***  ROS  ***ROS Physical Exam:   VS:  There were no vitals taken for this visit.   Wt Readings from Last 3 Encounters:  05/13/23 137 lb (62.1 kg)  04/10/23 135 lb (61.2 kg)  11/26/22 133 lb (60.3 kg)    BP Readings from Last 3 Encounters:  05/13/23 ROLLEN)  178/85  04/11/23 (!) 195/69  11/26/22 (!) 170/80   ***Physical Exam EKG:         ASSESSMENT AND PLAN: .    No diagnosis found.  Assessment and Plan Assessment & Plan    Follow up: ***  Signed,  Gordy Bergamo, MD, [facility] 02/03/2024, 1:21 PM [facility] [address] [place], [place] [address] Phone: [phone]. Fax:  [phone]

## 2024-02-06 ENCOUNTER — Encounter: Payer: Self-pay | Admitting: Cardiology

## 2024-03-24 ENCOUNTER — Other Ambulatory Visit: Payer: Self-pay | Admitting: Cardiology

## 2024-03-24 DIAGNOSIS — I10 Essential (primary) hypertension: Secondary | ICD-10-CM

## 2024-04-05 ENCOUNTER — Encounter: Payer: Self-pay | Admitting: Cardiology

## 2024-04-05 ENCOUNTER — Ambulatory Visit: Attending: Cardiology | Admitting: Cardiology

## 2024-04-05 VITALS — BP 130/73 | HR 82 | Resp 16 | Ht 69.0 in | Wt 135.0 lb

## 2024-04-05 DIAGNOSIS — I739 Peripheral vascular disease, unspecified: Secondary | ICD-10-CM | POA: Diagnosis not present

## 2024-04-05 DIAGNOSIS — I119 Hypertensive heart disease without heart failure: Secondary | ICD-10-CM

## 2024-04-05 DIAGNOSIS — E7800 Pure hypercholesterolemia, unspecified: Secondary | ICD-10-CM

## 2024-04-05 DIAGNOSIS — I6522 Occlusion and stenosis of left carotid artery: Secondary | ICD-10-CM

## 2024-04-05 NOTE — Progress Notes (Signed)
 Cardiology Office Note:  .   Date:  04/05/2024  ID:  Scott Chavez, DOB June 20, 1955, MRN [id number] PCP: Scott Bare, MD  [facility] Providers Cardiologist:  None   History of Present Illness: Scott   Scott Chavez is a 68 y.o. male with history of renal transplant in 2010, PAD with bilateral iliac artery stenting and bilateral femoropopliteal bypass surgery in the past, HTN with hypertensive heart disease and severe LVH and HLD who is here for a follow-up visit.  Due to recurrent claudication symptoms, he underwent repeat peripheral arteriogram by vascular surgery on 10/07/2022 and underwent bilateral iliac artery angioplasty for in-stent restenosis.  Vascular disease Being followed by surgeons.    Presently doing well and has minimal symptoms of claudication.  He has mild chronic dyspnea that is stable but states that overall patient is doing well without any lifestyle limiting symptoms.  States that he is active.    Discussed the use of AI scribe software for clinical note transcription with the patient, who gave verbal consent to proceed.  History of Present Illness Scott Chavez is a 68 year old male with peripheral arterial disease who presents for a routine cardiovascular follow-up.  He reports unilateral neck tightness with exercise that has been persistent but does not limit activity.  He has peripheral arterial disease with leg stents and mild symptoms that improve with walking.  He quit cigarettes over 20 years ago but currently vapes nicotine and flavored products.  He is retired from physical work but stays active with walking, yard work, and light exercise without cardiovascular limitation.  He has intermittent leg cramps that have improved with reduced physical strain.  He takes losartan  25 mg daily and nifedipine  90 mg for blood pressure, clopidogrel  for circulation, and rosuvastatin  10 mg for cholesterol.  He reports no significant activity limitation  and improved leg cramps.  Cardiac Studies relevent.    Lexiscan  myoview  stress test 05/27/2017: 1. The resting electrocardiogram demonstrated normal sinus rhythm, RAE, LAD, LAFB, normal resting conduction and no resting arrhythmias. Stress EKG is non-diagnostic for ischemia as it a pharmacologic stress using Lexiscan . Stress symptoms included dizziness. 2. Myocardial perfusion imaging is normal. Overall left ventricular systolic function was normal without regional wall motion abnormalities. The left ventricular ejection fraction was 52%. This is a low risk study.  Carotid artery duplex 06/04/2022: Duplex suggests stenosis in the right internal carotid artery (1-15%). No significant stenosis in the right external carotid artery. Duplex suggests stenosis in the left internal carotid artery (50-69%). No significant stenosis in the left external carotid artery. Antegrade right vertebral artery flow. Antegrade left vertebral artery flow.  Echocardiogram 06/04/2022: Hyperdynamic LV systolic function with visual EF >70%. Left ventricle cavity is normal in size. Normal global wall motion. Normal diastolic filling pattern, normal LAP. Severe left ventricular hypertrophy, peak LVOT gradient 3.48mmHG. Systolic anterior motion of the mitral valve. Mild (Grade I) aortic regurgitation. Mild tricuspid regurgitation. No evidence of pulmonary hypertension.  Labs   Lab Results  Component Value Date   CHOL 139 12/10/2022   HDL 61 12/10/2022   LDLCALC 60 12/10/2022   TRIG 99 12/10/2022   CHOLHDL 2.2 02/16/2016   No results found for: LIPOA  Recent Labs    04/11/23 0007 04/11/23 0033  NA 136 140  K 3.9 3.9  CL 103 105  CO2 23  --   GLUCOSE 103* 101*  BUN 18 17  CREATININE 1.48* 1.50*  CALCIUM  10.0  --   GFRNONAA  52*  --     Lab Results  Component Value Date   ALT 12 05/30/2021   AST 21 05/30/2021   ALKPHOS 61 05/30/2021   BILITOT 0.8 05/30/2021      Latest Ref Rng & Units 04/11/2023    12:33 AM 04/11/2023   12:07 AM 10/07/2022   10:01 AM  CBC  WBC 4.0 - 10.5 K/uL  6.9    Hemoglobin 13.0 - 17.0 g/dL 83.9  86.3  86.0   Hematocrit 39.0 - 52.0 % 47.0  43.3  41.0   Platelets 150 - 400 K/uL  264      Care everywhere/Faxed External Labs:  Labs 03/28/2024:  Hb 11.7/HCT 38.1, platelets 280, normal indicis.  Magnesium  1.8, phosphorus 3.0.  Serum creatinine 1.120, eGFR 65 mL, potassium 3.9.  Labs 12/14/2023:  Total cholesterol 124, triglycerides 56, HDL 57, LDL 60.  ROS  Review of Systems  Cardiovascular:  Positive for claudication (stable). Negative for chest pain, dyspnea on exertion and leg swelling.   Physical Exam:   VS:  BP 130/73 (BP Location: Left Arm, Patient Position: Sitting, Cuff Size: Normal)   Pulse 82   Resp 16   Ht 5' 9 (1.753 m)   Wt 135 lb (61.2 kg)   SpO2 98%   BMI 19.94 kg/m    Wt Readings from Last 3 Encounters:  04/05/24 135 lb (61.2 kg)  05/13/23 137 lb (62.1 kg)  04/10/23 135 lb (61.2 kg)    BP Readings from Last 3 Encounters:  04/05/24 130/73  05/13/23 (!) 178/85  04/11/23 (!) 195/69   Physical Exam Neck:     Vascular: No JVD.  Cardiovascular:     Rate and Rhythm: Normal rate and regular rhythm.     Pulses:          Carotid pulses are  on the left side with bruit.      Dorsalis pedis pulses are 0 on the right side and 0 on the left side.       Posterior tibial pulses are 0 on the right side and 0 on the left side.     Heart sounds: S1 normal and S2 normal. Murmur heard.     Midsystolic murmur is present with a grade of 2/6 at the apex.     No gallop.     Comments: CRT < 3 Sec Pulmonary:     Effort: Pulmonary effort is normal.     Breath sounds: Normal breath sounds.  Abdominal:     General: Bowel sounds are normal.     Palpations: Abdomen is soft.  Musculoskeletal:     Right lower leg: No edema.     Left lower leg: No edema.    EKG:    EKG Interpretation Date/Time:  Thursday April 05 2024 10:34:53  EST Ventricular Rate:  77 PR Interval:  144 QRS Duration:  82 QT Interval:  388 QTC Calculation: 439 R Axis:   -68  Text Interpretation: EKG 04/05/2024: Normal sinus rhythm with rate of 77 bpm, biatrial enlargement.  Left anterior fascicular block.  LVH.  Incomplete left bundle branch block.  Prominent R in V2, consider RVH.  Compared to 04/11/2023, no significant change. Confirmed by Kaiyana Bedore, Jagadeesh ([id number]) on 04/05/2024 10:47:33 AM    ASSESSMENT AND PLAN: .      ICD-10-CM   1. Hypertensive heart disease without heart failure  I11.9     2. Claudication in peripheral vascular disease  I73.9 EKG 12-Lead    Ambulatory referral  to Vascular Surgery    3. Asymptomatic stenosis of left carotid artery  I65.22     4. Hypercholesteremia  E78.00      Assessment & Plan Peripheral arterial disease with bilateral iliac artery stents Peripheral arterial disease is well-managed with mild symptoms. Capillary refill is approximately three seconds bilaterally. He is on clopidogrel  to maintain circulation. - Continue clopidogrel  for circulation maintenance - Follow up with vascular surgery for routine monitoring  Left carotid artery stenosis Left carotid artery stenosis. Vascular surgeon to evaluate neck artery. - Referred to vascular surgeon for neck artery evaluation - He already has an established relationship with them for management of lower extremity PAD.  Hypertensive heart disease without heart failure Hypertensive heart disease is well-controlled with losartan  and nifedipine . Blood pressure is stable. - Continue losartan  and nifedipine  for blood pressure management  Nicotine dependence, currently vaping Currently vaping with nicotine and strawberry flavor. Advised to quit due to potential effects on circulation and lungs. Discussed risks of vaping, including potential for acute lung injury and impact on circulation. - Advised cessation of vaping - He appears very motivated as he has made  significant lifestyle changes after he retired from work and now trying to concentrate on his health.  Hyperlipidemia Well-controlled with rosuvastatin . LDL is at goal of 60. - Continue rosuvastatin  for lipid management  Follow up: PRN  Signed,  Gordy Bergamo, MD, [facility] 04/05/2024, 5:51 PM [facility] [address] [place], [place] [address] Phone: [phone]. Fax:  [phone]

## 2024-04-05 NOTE — Patient Instructions (Signed)
 Medication Instructions:  Your physician recommends that you continue on your current medications as directed. Please refer to the Current Medication list given to you today.  *If you need a refill on your cardiac medications before your next appointment, please call your pharmacy*  Lab Work: None.  If you have labs (blood work) drawn today and your tests are completely normal, you will receive your results only by: MyChart Message (if you have MyChart) OR A paper copy in the mail If you have any lab test that is abnormal or we need to change your treatment, we will call you to review the results.  Testing/Procedures: None.  Follow-Up: At [facility], you and your health needs are our priority.  As part of our continuing mission to provide you with exceptional heart care, our providers are all part of one team.  This team includes your primary Cardiologist (physician) and Advanced Practice Providers or APPs (Physician Assistants and Nurse Practitioners) who all work together to provide you with the care you need, when you need it.  Your next appointment will be as needed and it will be with:     Provider:   Dr. Gordy Bergamo, MD   We recommend signing up for the patient portal called MyChart.  Sign up information is provided on this After Visit Summary.  MyChart is used to connect with patients for Virtual Visits (Telemedicine).  Patients are able to view lab/test results, encounter notes, upcoming appointments, etc.  Non-urgent messages can be sent to your provider as well.   To learn more about what you can do with MyChart, go to [email or web address].   Other Instructions Please make sure to keep your follow up appointment with Vascular Surgery in January 2026.

## 2024-04-18 ENCOUNTER — Other Ambulatory Visit: Payer: Self-pay | Admitting: Cardiology

## 2024-04-18 DIAGNOSIS — I10 Essential (primary) hypertension: Secondary | ICD-10-CM

## 2024-05-01 ENCOUNTER — Other Ambulatory Visit: Payer: Self-pay

## 2024-05-01 DIAGNOSIS — I70213 Atherosclerosis of native arteries of extremities with intermittent claudication, bilateral legs: Secondary | ICD-10-CM

## 2024-05-28 ENCOUNTER — Telehealth: Payer: Self-pay | Admitting: Vascular Surgery

## 2024-05-29 ENCOUNTER — Ambulatory Visit (HOSPITAL_COMMUNITY)

## 2024-05-29 ENCOUNTER — Ambulatory Visit (HOSPITAL_COMMUNITY): Payer: Self-pay

## 2024-05-29 ENCOUNTER — Ambulatory Visit

## 2024-05-29 NOTE — Progress Notes (Unsigned)
 " HISTORY AND PHYSICAL     CC:  follow up. Requesting Provider:  Catalina Bare, MD  HPI: This is a 69 y.o. male who is here today for follow up for PAD.   He was previously under the care of Dr. Laurence and had bilateral common iliac stents in 2015.  He also has a left common femoral to below-knee popliteal bypass with non-reversed ipsilateral vein done on 04/24/2013.  And on the right he had an iliofemoral endarterectomy with a right common femoral to below-knee pop bypass with non-reversed ipsilateral saphenous vein in 2016.  on 12/03/2021, he underwent left EIA angioplasty with stent placement by Dr. Gretta.  On 10/07/2022, he underwent left EIA angioplasty with DCBA for in stent stenosis, right CIA angioplasty with DCBA for in stent stenosis by Dr. Gretta.   Pt was last seen 11/16/2022 and at that time, he was not having any claudication, rest pain or wounds.  His duplex showed right EIA stent with elevated velocities.  But given he was asymptomatic and has hx of renal transplant, he was scheduled to f/u in 3 months for follow up but he has not been seen since that appt.   He was seen by Dr. Ladona last month.   Pt has hx of carotid stenosis followed by Dr. Ladona.  He had a carotid duplex in 2016 that revealed 1-39%.  His note recommends follow up with vascular for this.  He is scheduled to f/u with his as needed.   The pt returns today for follow up.  ***  The pt is on a statin for cholesterol management.    The pt is not on an aspirin .    Other AC:  Plavix  The pt is on CCB, ARB for hypertension.  The pt is not on diabetic medication. Tobacco hx:  former  Pt does *** have family hx of AAA.  Past Medical History:  Diagnosis Date   ADHD    Anemia    pt. not sure, but reports he is taking Fe for one month now.   Aorto-iliac disease    Bladder dysfunction    ESRD (end stage renal disease) (HCC)    [facility]- [facility] , followed by Dr. Prescilla    GERD (gastroesophageal  reflux disease)    Hemodialysis patient    on dialysis 1998-2010 04/25/2013)   Hepatitis C    has been treated with Harvoni    History of renal failure    Hypercholesteremia    Hypertension    Internal hemorrhoids    Colonoscopy 2010 and anoscopy 2015   Peripheral vascular disease    Pneumonia    Self-catheterizes urinary bladder    pt. choice, but he reports that he desires to do this to avoid retention     Past Surgical History:  Procedure Laterality Date   ABDOMINAL AORTAGRAM N/A 03/09/2013   Procedure: ABDOMINAL EZELLA;  Surgeon: Carlin FORBES Haddock, MD;  Location: [facility] CATH LAB;  Service: Cardiovascular;  Laterality: N/A;   ABDOMINAL AORTOGRAM W/LOWER EXTREMITY N/A 12/03/2021   Procedure: ABDOMINAL AORTOGRAM W/LOWER EXTREMITY;  Surgeon: Gretta Lonni PARAS, MD;  Location: [facility] INVASIVE CV LAB;  Service: Cardiovascular;  Laterality: N/A;   ABDOMINAL AORTOGRAM W/LOWER EXTREMITY N/A 10/07/2022   Procedure: ABDOMINAL AORTOGRAM W/LOWER EXTREMITY;  Surgeon: Gretta Lonni PARAS, MD;  Location: [facility] INVASIVE CV LAB;  Service: Cardiovascular;  Laterality: N/A;   AV FISTULA PLACEMENT Left 1998   removed 2011 (04/25/2013)   AV FISTULA REPAIR Left    removal  with partial vein removed   COLONOSCOPY     ENDARTERECTOMY FEMORAL Right 04/17/2015   Procedure: ENDARTERECTOMY RIGHT ILIOFEMORAL WITH PROFUNDOPLASTY;  Surgeon: Redell LITTIE Door, MD;  Location: [facility] OR;  Service: Vascular;  Laterality: Right;   ENDARTERECTOMY FEMORAL Right 10/25/2016   Procedure: RIGHT ILIOFEMORAL ENDARTERECTOMY WITH BOVINE PERICARDIAL PATCH ANGIOPLASTY;  Surgeon: Door Redell LITTIE, MD;  Location: [facility] OR;  Service: Vascular;  Laterality: Right;   ESOPHAGOGASTRODUODENOSCOPY (EGD) WITH PROPOFOL  N/A 02/05/2019   Procedure: ESOPHAGOGASTRODUODENOSCOPY (EGD) WITH PROPOFOL ;  Surgeon: Rosalie Kitchens, MD;  Location: [facility] ENDOSCOPY;  Service: Endoscopy;  Laterality: N/A;   FEMORAL-POPLITEAL BYPASS GRAFT Left 04/24/2013   Procedure: BYPASS GRAFT  COMMON FEMORALARTERY-BELOW KNEE POPLITEAL ARTERY WITH GREATER SAPHENOUS VEIN;  Surgeon: Redell LITTIE Door, MD;  Location: [facility] OR;  Service: Vascular;  Laterality: Left;   FEMORAL-POPLITEAL BYPASS GRAFT Right 04/17/2015   Procedure: BYPASS GRAFT RIGHT FEMORALTO BELOW KNEE POPLITEAL ARTERY USING RIGHT NON REVERSED GREATER SAPPHENOUS VEIN;  Surgeon: Redell LITTIE Door, MD;  Location: [facility] OR;  Service: Vascular;  Laterality: Right;   HERNIA REPAIR  08/2019   INGUINAL HERNIA REPAIR Left 08/29/2019   Procedure: LEFT INGUINAL HERNIA REPAIR WITH MESH;  Surgeon: Vanderbilt Ned, MD;  Location: [facility];  Service: General;  Laterality: Left;   INTRAOPERATIVE ARTERIOGRAM Left 04/24/2013   Procedure: INTRA OPERATIVE ARTERIOGRAM;  Surgeon: Redell LITTIE Door, MD;  Location: [facility] OR;  Service: Vascular;  Laterality: Left;   KIDNEY TRANSPLANT     LOWER EXTREMITY ANGIOGRAM Bilateral 03/09/2013   Procedure: LOWER EXTREMITY ANGIOGRAM;  Surgeon: Carlin FORBES Haddock, MD;  Location: [facility] CATH LAB;  Service: Cardiovascular;  Laterality: Bilateral;   LOWER EXTREMITY ANGIOGRAM Right 10/25/2013   Procedure: LOWER EXTREMITY ANGIOGRAM;  Surgeon: Redell LITTIE Door, MD;  Location: [facility] CATH LAB;  Service: Cardiovascular;  Laterality: Right;   LOWER EXTREMITY ANGIOGRAM Right 12/13/2013   Procedure: LOWER EXTREMITY ANGIOGRAM;  Surgeon: Redell LITTIE Door, MD;  Location: [facility] CATH LAB;  Service: Cardiovascular;  Laterality: Right;   NEPHRECTOMY RECIPIENT  2010   NEPHRECTOMY TRANSPLANTED ORGAN     PATCH ANGIOPLASTY Right 04/17/2015   Procedure: GEORGE PATCH ANGIOPLASTY, RIGHT ILEOFEMORAL;  Surgeon: Redell LITTIE Door, MD;  Location: [facility] OR;  Service: Vascular;  Laterality: Right;   PERIPHERAL VASCULAR BALLOON ANGIOPLASTY Left 10/07/2022   Procedure: PERIPHERAL VASCULAR BALLOON ANGIOPLASTY;  Surgeon: Gretta Lonni PARAS, MD;  Location: [facility] INVASIVE CV LAB;  Service: Cardiovascular;  Laterality: Left;  Ext Iliac   PERIPHERAL VASCULAR CATHETERIZATION N/A 04/10/2015    Procedure: Abdominal Aortogram;  Surgeon: Redell LITTIE Door, MD;  Location: [facility] INVASIVE CV LAB;  Service: Cardiovascular;  Laterality: N/A;   PERIPHERAL VASCULAR CATHETERIZATION N/A 05/10/2016   Procedure: Abdominal Aortogram w/Lower Extremity;  Surgeon: Redell LITTIE Door, MD;  Location: [facility] INVASIVE CV LAB;  Service: Cardiovascular;  Laterality: N/A;   PERIPHERAL VASCULAR INTERVENTION  12/03/2021   Procedure: PERIPHERAL VASCULAR INTERVENTION;  Surgeon: Gretta Lonni PARAS, MD;  Location: [facility] INVASIVE CV LAB;  Service: Cardiovascular;;   s/p cadaveric renal transplant  December 2010   [facility]   THROMBECTOMY W/ EMBOLECTOMY Left 09/24/2015   Procedure: EXCISION OF THROMBOSED RADIOCEPHALIC ARTERIOVENOUS FISTULA;  Surgeon: Redell LITTIE Door, MD;  Location: [facility] OR;  Service: Vascular;  Laterality: Left;   UMBILICAL HERNIA REPAIR N/A 08/29/2019   Procedure: UMBILICAL HERNIA REPAIR WITH MESH;  Surgeon: Vanderbilt Ned, MD;  Location: [facility];  Service: General;  Laterality: N/A;   VEIN HARVEST Right 04/17/2015   Procedure:  RIGHT GREATER SAPPHENOUS VEIN HARVEST ;  Surgeon: Redell LITTIE Door, MD;  Location: [facility] OR;  Service: Vascular;  Laterality: Right;    Allergies[1]  Current Outpatient Medications  Medication Sig Dispense Refill   clopidogrel  (PLAVIX ) 75 MG tablet TAKE 1 TABLET BY MOUTH EVERY DAY 90 tablet 3   Cyanocobalamin  (VITAMIN B-12 PO) Take 1 tablet by mouth daily.     ferrous sulfate  325 (65 FE) MG EC tablet Take 1 tablet (325 mg total) by mouth 2 (two) times daily. 90 tablet 3   HYDROcodone -acetaminophen  (NORCO/VICODIN) 5-325 MG tablet Take 1 tablet by mouth every 6 (six) hours as needed for severe pain (pain score 7-10). 15 tablet 0   latanoprost (XALATAN) 0.005 % ophthalmic solution Place 1 drop into both eyes at bedtime.     losartan  (COZAAR ) 25 MG tablet TAKE 1 TABLET BY MOUTH EVERY DAY IN THE EVENING 90 tablet 1   Multiple Vitamin (MULTIVITAMIN WITH MINERALS) TABS tablet Take 1 tablet  by mouth daily.     mycophenolate  (CELLCEPT ) 250 MG capsule Take 250 mg by mouth 2 (two) times daily.     NIFEdipine  (ADALAT  CC) 90 MG 24 hr tablet Take 1 tablet (90 mg total) by mouth daily. 90 tablet 3   rosuvastatin  (CRESTOR ) 10 MG tablet TAKE 1 TABLET BY MOUTH EVERY DAY 90 tablet 3   tacrolimus  (PROGRAF ) 1 MG capsule Take 3-4 mg by mouth See admin instructions. Take 4 mg by mouth in the morning and 3 mg in the evening     tadalafil (CIALIS) 20 MG tablet Take 20 mg by mouth daily as needed for erectile dysfunction.     tamsulosin  (FLOMAX ) 0.4 MG CAPS capsule Take 0.4 mg by mouth daily.     zolpidem  (AMBIEN ) 10 MG tablet Take 10 mg by mouth at bedtime as needed for sleep.     No current facility-administered medications for this visit.    Family History  Problem Relation Age of Onset   Hypertension Father    Other Father        amputation   Hypertension Mother     Social History   Socioeconomic History   Marital status: Divorced    Spouse name: Not on file   Number of children: 3   Years of education: Not on file   Highest education level: Not on file  Occupational History   Occupation: student   Occupation: [employer] driver  Tobacco Use   Smoking status: Former    Current packs/day: 0.00    Average packs/day: 0.5 packs/day for 34.0 years (17.0 ttl pk-yrs)    Types: E-cigarettes, Cigarettes    Start date: 02/16/1969    Quit date: 02/17/2003    Years since quitting: 21.2    Passive exposure: Never   Smokeless tobacco: Former   Tobacco comments:    vapor cigrarette  Vaping Use   Vaping status: Some Days  Substance and Sexual Activity   Alcohol use: Not Currently    Alcohol/week: 0.0 standard drinks of alcohol   Drug use: Not Currently    Types: Cocaine, Marijuana, Heroin, Other-see comments    Comment: pt states used TCH a few weeks ago/ 04/25/2013 abused in the past ; stopped  11/13/2002.....states smokes marijuana again 2021   Sexual activity: Not on file  Other  Topics Concern   Not on file  Social History Narrative   Patient reports he is divorced. 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 As of  2015.  On disability otherwise, he is a vapor smoker, 3 caffeinated beverages daily   Social Drivers of Health   Tobacco Use: Medium Risk (04/05/2024)   Patient History    Smoking Tobacco Use: Former    Smokeless Tobacco Use: Former    Passive Exposure: Never  Programmer, Applications: Not on Ship Broker Insecurity: Not on Chartered Certified Accountant Needs: Not on file  Physical Activity: Not on file  Stress: Not on file  Social Connections: Not on file  Intimate Partner Violence: Not on file  Depression (PHQ2-9): Not on file  Alcohol Screen: Not on file  Housing: Not on file  Utilities: Not on file  Health Literacy: Not on file     REVIEW OF SYSTEMS:  *** [X]  denotes positive finding, [ ]  denotes negative finding Cardiac  Comments:  Chest pain or chest pressure:    Shortness of breath upon exertion:    Short of breath when lying flat:    Irregular heart rhythm:        Vascular    Pain in calf, thigh, or hip brought on by ambulation:    Pain in feet at night that wakes you up from your sleep:     Blood clot in your veins:    Leg swelling:         Pulmonary    Oxygen at home:    Productive cough:     Wheezing:         Neurologic    Sudden weakness in arms or legs:     Sudden numbness in arms or legs:     Sudden onset of difficulty speaking or slurred speech:    Temporary loss of vision in one eye:     Problems with dizziness:         Gastrointestinal    Blood in stool:     Vomited blood:         Genitourinary    Burning when urinating:     Blood in urine:        Psychiatric    Major depression:         Hematologic    Bleeding problems:    Problems with blood clotting too easily:        Skin    Rashes or ulcers:        Constitutional    Fever or chills:      PHYSICAL EXAMINATION:  ***  General:  WDWN in NAD;  vital signs documented above Gait: Not observed HENT: WNL, normocephalic Pulmonary: normal non-labored breathing , without wheezing Cardiac: {Desc; regular/irreg:14544} HR, {With/Without:20273} carotid bruit*** Abdomen: soft, NT; aortic pulse is *** palpable Skin: {With/Without:20273} rashes Vascular Exam/Pulses:  Right Left  Radial {Exam; arterial pulse strength 0-4:30167} {Exam; arterial pulse strength 0-4:30167}  Femoral {Exam; arterial pulse strength 0-4:30167} {Exam; arterial pulse strength 0-4:30167}  Popliteal {Exam; arterial pulse strength 0-4:30167} {Exam; arterial pulse strength 0-4:30167}  DP {Exam; arterial pulse strength 0-4:30167} {Exam; arterial pulse strength 0-4:30167}  PT {Exam; arterial pulse strength 0-4:30167} {Exam; arterial pulse strength 0-4:30167}  Peroneal *** ***   Extremities: {With/Without:20273} ischemic changes, {With/Without:20273} Gangrene , {With/Without:20273} cellulitis; {With/Without:20273} open wounds Musculoskeletal: no muscle wasting or atrophy  Neurologic: A&O X 3 Psychiatric:  The pt has {Desc; normal/abnormal:11317::Normal} affect.   Non-Invasive Vascular Imaging:   ABI's/TBI's on 05/29/2024: Right:  *** - Great toe pressure: *** Left:  *** - Great toe pressure: ***  Arterial duplex on 05/29/2024: ***  Previous ABI's/TBI's on 11/11/2022: Right:  0.86/0.27 - Great toe pressure: 34 Left:  0.71/0.40 - Great toe pressure:  50  Previous arterial duplex on 11/11/2022: Abdominal Aorta Findings:  +-------------+-------+----------+----------+----------+--------+--------+  Location    AP (cm)Trans (cm)PSV (cm/s)Waveform  ThrombusComments  +-------------+-------+----------+----------+----------+--------+--------+  RT EIA Prox                   175       monophasic                  +-------------+-------+----------+----------+----------+--------+--------+  RT EIA Mid                    135       monophasic                   +-------------+-------+----------+----------+----------+--------+--------+  RT EIA Distal                 89        monophasic                  +-------------+-------+----------+----------+----------+--------+--------+   Right Stent(s):  +---------------+--------+---------------+----------+--------+  CIA           PSV cm/sStenosis       Waveform  Comments  +---------------+--------+---------------+----------+--------+  Prox to Stent  203                    biphasic            +---------------+--------+---------------+----------+--------+  Proximal Stent 201                    biphasic            +---------------+--------+---------------+----------+--------+  Mid Stent      331     50-99% stenosismonophasic          +---------------+--------+---------------+----------+--------+  Distal Stent   170                    monophasic          +---------------+--------+---------------+----------+--------+  Distal to Stent153                    monophasic          +---------------+--------+---------------+----------+--------+   Left Stent(s):  +---------------+--------+--------+----------+--------+  CIA           PSV cm/sStenosisWaveform  Comments  +---------------+--------+--------+----------+--------+  Prox to Stent  96              monophasic          +---------------+--------+--------+----------+--------+  Proximal Stent 86              monophasic          +---------------+--------+--------+----------+--------+  Mid Stent      123             monophasic          +---------------+--------+--------+----------+--------+  Distal Stent   82              monophasic          +---------------+--------+--------+----------+--------+  Distal to Stent73              monophasic          +---------------+--------+--------+----------+--------+   +---------------+--------+---------------+----------+--------+  EIA            PSV cm/sStenosis       Waveform  Comments  +---------------+--------+---------------+----------+--------+  Prox to Stent  119  monophasic          +---------------+--------+---------------+----------+--------+  Proximal Stent 50                     monophasic          +---------------+--------+---------------+----------+--------+  Mid Stent      108                    monophasic          +---------------+--------+---------------+----------+--------+  Distal Stent   215     50-99% stenosismonophasic          +---------------+--------+---------------+----------+--------+  Distal to Stent329     50-99% stenosisbiphasic            +---------------+--------+---------------+----------+--------+   Summary:  Stenosis: +--------------------+-------------+---------------+  Location            Stenosis     Stent            +--------------------+-------------+---------------+  Right Common Iliac               50-99% stenosis  +--------------------+-------------+---------------+  Left Common Iliac                1-49% stenosis   +--------------------+-------------+---------------+  Right External Iliac<50% stenosis                 +--------------------+-------------+---------------+  Left External Iliac >50% stenosis50-99% stenosis  +--------------------+-------------+---------------+     ASSESSMENT/PLAN:: 69 y.o. male here for follow up for PAD with hx of bilateral common iliac stents in 2015.  He also has a left common femoral to below-knee popliteal bypass with non-reversed ipsilateral vein done on 04/24/2013.  And on the right he had an iliofemoral endarterectomy with a right common femoral to below-knee pop bypass with non-reversed ipsilateral saphenous vein in 2016.  on 12/03/2021, he underwent left EIA angioplasty with stent placement by Dr. Gretta.  On 10/07/2022, he underwent left EIA angioplasty with DCBA for in stent  stenosis, right CIA angioplasty with DCBA for in stent stenosis by Dr. Gretta.    -*** -continue plavix chrystie -discussed importance of increased walking daily -pt will f/u in *** with ***.   Lucie Apt, [facility] [phone]  Clinic MD:   Gretta    [1]  Allergies Allergen Reactions   Tape Rash and Other (See Comments)    NO Silk tape, please!!   "

## 2024-08-07 ENCOUNTER — Ambulatory Visit (HOSPITAL_COMMUNITY)

## 2024-08-07 ENCOUNTER — Ambulatory Visit
